# Patient Record
Sex: Female | Born: 1965
Health system: Southern US, Community
[De-identification: ages and names within clinical notes are randomized; demographics above are authoritative.]

## PROBLEM LIST (undated history)

## (undated) DIAGNOSIS — I10 Essential (primary) hypertension: Secondary | ICD-10-CM

## (undated) DIAGNOSIS — I2510 Atherosclerotic heart disease of native coronary artery without angina pectoris: Secondary | ICD-10-CM

## (undated) DIAGNOSIS — R519 Headache, unspecified: Secondary | ICD-10-CM

## (undated) DIAGNOSIS — Z9289 Personal history of other medical treatment: Secondary | ICD-10-CM

## (undated) DIAGNOSIS — C801 Malignant (primary) neoplasm, unspecified: Secondary | ICD-10-CM

## (undated) DIAGNOSIS — I519 Heart disease, unspecified: Secondary | ICD-10-CM

## (undated) DIAGNOSIS — J45909 Unspecified asthma, uncomplicated: Secondary | ICD-10-CM

## (undated) DIAGNOSIS — K219 Gastro-esophageal reflux disease without esophagitis: Secondary | ICD-10-CM

## (undated) DIAGNOSIS — R7303 Prediabetes: Secondary | ICD-10-CM

## (undated) DIAGNOSIS — E669 Obesity, unspecified: Secondary | ICD-10-CM

## (undated) DIAGNOSIS — R202 Paresthesia of skin: Principal | ICD-10-CM

## (undated) HISTORY — DX: Heart disease, unspecified: I51.9

## (undated) HISTORY — DX: Prediabetes: R73.03

## (undated) HISTORY — PX: ANGIOPLASTY: SHX39

## (undated) HISTORY — DX: Obesity, unspecified: E66.9

## (undated) HISTORY — DX: Paresthesia of skin: R20.2

## (undated) HISTORY — DX: Headache, unspecified: R51.9

## (undated) HISTORY — PX: JOINT REPLACEMENT: SHX530

## (undated) HISTORY — PX: BREAST BIOPSY: SHX20

## (undated) HISTORY — DX: Unspecified asthma, uncomplicated: J45.909

## (undated) HISTORY — PX: BREAST BIOPSY WITH SENTINEL LYMPH NODE BIOPSY AND NEEDLE LOCALIZATION: SHX5761

## (undated) HISTORY — PX: TOTAL KNEE ARTHROPLASTY: SHX125

---

## 2013-06-04 ENCOUNTER — Encounter (HOSPITAL_COMMUNITY): Payer: Self-pay | Admitting: Emergency Medicine

## 2013-06-04 ENCOUNTER — Emergency Department (INDEPENDENT_AMBULATORY_CARE_PROVIDER_SITE_OTHER)
Admission: EM | Admit: 2013-06-04 | Discharge: 2013-06-04 | Disposition: A | Discharge status: Home / Self Care | Payer: BC Managed Care – PPO | Source: Home / Self Care

## 2013-06-04 DIAGNOSIS — M6530 Trigger finger, unspecified finger: Secondary | ICD-10-CM

## 2013-06-04 DIAGNOSIS — G5601 Carpal tunnel syndrome, right upper limb: Secondary | ICD-10-CM

## 2013-06-04 DIAGNOSIS — M778 Other enthesopathies, not elsewhere classified: Secondary | ICD-10-CM

## 2013-06-04 DIAGNOSIS — G5600 Carpal tunnel syndrome, unspecified upper limb: Secondary | ICD-10-CM

## 2013-06-04 DIAGNOSIS — M65839 Other synovitis and tenosynovitis, unspecified forearm: Secondary | ICD-10-CM

## 2013-06-04 MED ORDER — TRAMADOL HCL 50 MG PO TABS: 50.0000 mg | ORAL_TABLET | Freq: Four times a day (QID) | ORAL | Status: DC | PRN

## 2013-06-04 MED ORDER — DICLOFENAC POTASSIUM 50 MG PO TABS: 50.0000 mg | ORAL_TABLET | Freq: Three times a day (TID) | ORAL | Status: DC

## 2013-06-04 NOTE — ED Notes (Signed)
Pt reports bilateral hand pain. Pain is worse in the left hand. Pt states that the pain is throbbing and radiates down to elbow. Left thumb tends to get stiff and stuck in a bent position. Pt has been using wrist brace and ibuprofen with no relief in symptoms.

## 2013-06-04 NOTE — ED Provider Notes (Signed)
CSN: [id number]     Arrival date & time 06/04/13  1252 History     First MD Initiated Contact with Patient 06/04/13 1413     Chief Complaint  Patient presents with  . Hand Pain    bilateral hand pain. pain is worse in the left hand.    (Consider location/radiation/quality/duration/timing/severity/associated sxs/prior Treatment) HPI Comments: 47 year old female presents with at least four-month history of bilateral hand and digit wrist and forearm pain. 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 During that time she developed the above pain. She also is complaining of a trigger finger of the left thumb. She is able to flex at the 10 unable to re\re extend it without help from the other hand. She is tender in all the digits,, wrist and forearm. Although she has nearly full range of motion most movements hurt the associated tendons and forearm musculature. No known blunt trauma or injuries such as falls.   History reviewed. No pertinent past medical history. Past Surgical History  Procedure Laterality Date  . Total knee arthroplasty     History reviewed. No pertinent family history. History  Substance Use Topics  . Smoking status: Never Smoker   . Smokeless tobacco: Not on file  . Alcohol Use: No   OB History   Grav Para Term Preterm Abortions TAB SAB Ect Mult Living                 Review of Systems  Constitutional: Negative for fever, chills and activity change.  HENT: Negative.   Respiratory: Negative.   Cardiovascular: Negative.   Musculoskeletal:       As per HPI  Skin: Negative for color change, pallor and rash.  Neurological: Negative.     Allergies  Penicillins  Home Medications   Current Outpatient Rx  Name  Route  Sig  Dispense  Refill  . diclofenac (CATAFLAM) 50 MG tablet   Oral   Take 1 tablet (50 mg total) by mouth 3 (three) times daily.   24 tablet   0   . traMADol  (ULTRAM) 50 MG tablet   Oral   Take 1 tablet (50 mg total) by mouth every 6 (six) hours as needed for pain.   15 tablet   0    BP 128/72  Pulse 68  Temp(Src) 98.6 F (37 C) (Oral)  Resp 18  SpO2 100% Physical Exam  Nursing note and vitals reviewed. Constitutional: She is oriented to person, place, and time. She appears well-developed and well-nourished. No distress.  HENT:  Head: Normocephalic and atraumatic.  Eyes: EOM are normal.  Pulmonary/Chest: Effort normal. No respiratory distress.  Musculoskeletal:  No edema is appreciated. No erythema. There is tenderness along bilateral forearms, wrists and digits. Positive Finkelstein bilateral. Trigger finger left thumb, she is able to make a fist but this produces pain in the digits, wrist and forearm. Positive Phalen's sign. Positive Tinel's.  Neurological: She is alert and oriented to person, place, and time. No cranial nerve deficit.  Skin: Skin is warm and dry.  Psychiatric: She has a normal mood and affect.    ED Course   Procedures (including critical care time)  Labs Reviewed - No data to display No results found. 1. Tendinitis of both wrists   2. Tendinitis of thumb   3. Trigger finger, left   4. Carpal tunnel syndrome of right wrist  MDM  He splints until you see your orthopedist next month as scheduled. We will also apply a left thumb splint and near extension to slightly flex. Apply ice as needed to the wrist and fingers. Ultram 50 mg every 6 hours when necessary pain Cataflam 50 mg 3 times a day when necessary pain Limit use of hands and wrist as much as possible.  Hayden Rasmussen, NP 06/04/13 1438

## 2013-06-04 NOTE — ED Notes (Signed)
Review of Rx

## 2013-06-04 NOTE — ED Provider Notes (Signed)
Medical screening examination/treatment/procedure(s) were performed by non-physician practitioner and as supervising physician I was immediately available for consultation/collaboration.  Leslee Home, M.D.  Reuben Likes, MD 06/04/13 [phone]

## 2013-06-04 NOTE — ED Notes (Signed)
Chart review.

## 2013-06-04 NOTE — ED Notes (Signed)
Waiting discharge papers 

## 2013-07-06 ENCOUNTER — Other Ambulatory Visit: Payer: Self-pay | Admitting: Internal Medicine

## 2013-07-06 DIAGNOSIS — N6320 Unspecified lump in the left breast, unspecified quadrant: Secondary | ICD-10-CM

## 2013-07-19 ENCOUNTER — Ambulatory Visit
Admission: RE | Admit: 2013-07-19 | Discharge: 2013-07-19 | Disposition: A | Discharge status: Home / Self Care | Payer: BC Managed Care – PPO | Source: Ambulatory Visit | Attending: Internal Medicine | Admitting: Internal Medicine

## 2013-07-19 ENCOUNTER — Other Ambulatory Visit: Payer: Self-pay | Admitting: Internal Medicine

## 2013-07-19 DIAGNOSIS — N6320 Unspecified lump in the left breast, unspecified quadrant: Secondary | ICD-10-CM

## 2013-10-03 ENCOUNTER — Other Ambulatory Visit (HOSPITAL_COMMUNITY)
Admission: RE | Admit: 2013-10-03 | Discharge: 2013-10-03 | Disposition: A | Discharge status: Home / Self Care | Payer: BC Managed Care – PPO | Source: Ambulatory Visit | Attending: Obstetrics & Gynecology | Admitting: Obstetrics & Gynecology

## 2013-10-03 ENCOUNTER — Other Ambulatory Visit: Payer: Self-pay | Admitting: Obstetrics & Gynecology

## 2013-10-03 DIAGNOSIS — Z01419 Encounter for gynecological examination (general) (routine) without abnormal findings: Secondary | ICD-10-CM | POA: Insufficient documentation

## 2013-10-03 DIAGNOSIS — Z1151 Encounter for screening for human papillomavirus (HPV): Secondary | ICD-10-CM | POA: Insufficient documentation

## 2013-11-05 ENCOUNTER — Other Ambulatory Visit: Payer: Self-pay | Admitting: Gastroenterology

## 2013-11-06 ENCOUNTER — Encounter (HOSPITAL_COMMUNITY): Payer: Self-pay | Admitting: Pharmacy Technician

## 2013-11-06 ENCOUNTER — Encounter (HOSPITAL_COMMUNITY): Payer: Self-pay | Admitting: *Deleted

## 2013-12-05 ENCOUNTER — Encounter (HOSPITAL_COMMUNITY): Payer: Self-pay | Admitting: *Deleted

## 2013-12-12 DIAGNOSIS — F3289 Other specified depressive episodes: Secondary | ICD-10-CM | POA: Diagnosis not present

## 2013-12-12 DIAGNOSIS — F329 Major depressive disorder, single episode, unspecified: Secondary | ICD-10-CM | POA: Diagnosis not present

## 2013-12-12 DIAGNOSIS — N3941 Urge incontinence: Secondary | ICD-10-CM | POA: Diagnosis not present

## 2014-01-30 ENCOUNTER — Encounter (HOSPITAL_COMMUNITY): Payer: Self-pay | Admitting: *Deleted

## 2014-02-18 ENCOUNTER — Other Ambulatory Visit: Payer: Self-pay | Admitting: Gastroenterology

## 2014-02-19 ENCOUNTER — Ambulatory Visit (HOSPITAL_COMMUNITY)
Admission: RE | Admit: 2014-02-19 | Discharge: 2014-02-19 | Disposition: A | Discharge status: Home / Self Care | Payer: Medicare Other | Source: Ambulatory Visit | Attending: Gastroenterology | Admitting: Gastroenterology

## 2014-02-19 ENCOUNTER — Encounter (HOSPITAL_COMMUNITY)
Admission: RE | Disposition: A | Discharge status: Home / Self Care | Payer: Self-pay | Source: Ambulatory Visit | Attending: Gastroenterology

## 2014-02-19 ENCOUNTER — Ambulatory Visit (HOSPITAL_COMMUNITY): Payer: Medicare Other | Admitting: Anesthesiology

## 2014-02-19 ENCOUNTER — Encounter (HOSPITAL_COMMUNITY): Payer: Self-pay | Admitting: *Deleted

## 2014-02-19 ENCOUNTER — Encounter (HOSPITAL_COMMUNITY): Payer: Medicare Other | Admitting: Anesthesiology

## 2014-02-19 DIAGNOSIS — E669 Obesity, unspecified: Secondary | ICD-10-CM | POA: Insufficient documentation

## 2014-02-19 DIAGNOSIS — Z9079 Acquired absence of other genital organ(s): Secondary | ICD-10-CM | POA: Diagnosis not present

## 2014-02-19 DIAGNOSIS — K921 Melena: Secondary | ICD-10-CM | POA: Insufficient documentation

## 2014-02-19 DIAGNOSIS — K219 Gastro-esophageal reflux disease without esophagitis: Secondary | ICD-10-CM | POA: Diagnosis not present

## 2014-02-19 DIAGNOSIS — Z880 Allergy status to penicillin: Secondary | ICD-10-CM | POA: Insufficient documentation

## 2014-02-19 DIAGNOSIS — Z96659 Presence of unspecified artificial knee joint: Secondary | ICD-10-CM | POA: Diagnosis not present

## 2014-02-19 DIAGNOSIS — I10 Essential (primary) hypertension: Secondary | ICD-10-CM | POA: Insufficient documentation

## 2014-02-19 HISTORY — DX: Personal history of other medical treatment: Z92.89

## 2014-02-19 HISTORY — DX: Essential (primary) hypertension: I10

## 2014-02-19 HISTORY — DX: Gastro-esophageal reflux disease without esophagitis: K21.9

## 2014-02-19 HISTORY — PX: COLONOSCOPY WITH PROPOFOL: SHX5780

## 2014-02-19 SURGERY — COLONOSCOPY WITH PROPOFOL
Anesthesia: Monitor Anesthesia Care

## 2014-02-19 MED ORDER — LACTATED RINGERS IV SOLN
INTRAVENOUS | Status: DC | PRN
  Administered 2014-02-19: 10:00:00 via INTRAVENOUS

## 2014-02-19 MED ORDER — PROPOFOL INFUSION 10 MG/ML OPTIME
INTRAVENOUS | Status: DC | PRN
  Administered 2014-02-19: 140 ug/kg/min via INTRAVENOUS

## 2014-02-19 MED ORDER — SODIUM CHLORIDE 0.9 % IV SOLN: INTRAVENOUS | Status: DC

## 2014-02-19 MED ORDER — KETAMINE HCL 10 MG/ML IJ SOLN
INTRAMUSCULAR | Status: DC | PRN
  Administered 2014-02-19: 10 mg via INTRAVENOUS

## 2014-02-19 MED ORDER — FENTANYL CITRATE 0.05 MG/ML IJ SOLN
INTRAMUSCULAR | Status: DC | PRN
  Administered 2014-02-19 (×2): 50 ug via INTRAVENOUS

## 2014-02-19 MED ORDER — LACTATED RINGERS IV SOLN: INTRAVENOUS | Status: DC

## 2014-02-19 SURGICAL SUPPLY — 21 items

## 2014-02-19 NOTE — Anesthesia Preprocedure Evaluation (Addendum)
Anesthesia Evaluation  Patient identified by MRN, date of birth, ID band Patient awake    Reviewed: Allergy & Precautions, H&P , NPO status , Patient's Chart, lab work & pertinent test results  Airway Mallampati: II TM Distance: >3 FB Neck ROM: Full    Dental  (+) Dental Advisory Given   Pulmonary neg pulmonary ROS,  breath sounds clear to auscultation- rhonchi        Cardiovascular hypertension, Pt. on medications Rhythm:Regular Rate:Normal     Neuro/Psych negative neurological ROS  negative psych ROS   GI/Hepatic Neg liver ROS, GERD-  Medicated,  Endo/Other  negative endocrine ROS  Renal/GU negative Renal ROS     Musculoskeletal negative musculoskeletal ROS (+)   Abdominal (+) + obese,   Peds  Hematology negative hematology ROS (+)   Anesthesia Other Findings   Reproductive/Obstetrics negative OB ROS                          Anesthesia Physical Anesthesia Plan  ASA: II  Anesthesia Plan: MAC   Post-op Pain Management:    Induction: Intravenous  Airway Management Planned:   Additional Equipment:   Intra-op Plan:   Post-operative Plan:   Informed Consent: I have reviewed the patients History and Physical, chart, labs and discussed the procedure including the risks, benefits and alternatives for the proposed anesthesia with the patient or authorized representative who has indicated his/her understanding and acceptance.   Dental advisory given  Plan Discussed with: CRNA  Anesthesia Plan Comments:         Anesthesia Quick Evaluation

## 2014-02-19 NOTE — Op Note (Signed)
Problem: Intermittent hematochezia  Endoscopist: Danise EdgeMartin Ajane Novella  Premedication: Propofol administered by anesthesia  Procedure: Diagnostic colonoscopy Anal inspection and digital rectal exam were normal. The Pentax pediatric colonoscope was introduced into the rectum and easily advanced to the cecum. A normal-appearing ileocecal valve and appendiceal orifice were identified. Colonic preparation for the exam today was good  Rectum. Normal. Retroflexed view of the distal rectum showed moderate sized nonbleeding internal hemorrhoids  Sigmoid colon and descending colon. Normal  Splenic flexure. Normal  Transverse colon. Normal  Hepatic flexure. Normal  Ascending colon. Normal  Cecum and ileocecal valve. Normal  Assessment: Normal diagnostic proctocolonoscopy to the cecum.  Recommendation: Schedule screening colonoscopy in 10 years

## 2014-02-19 NOTE — Transfer of Care (Signed)
Immediate Anesthesia Transfer of Care Note  Patient: Jo Allen  Procedure(s) Performed: Procedure(s): COLONOSCOPY WITH PROPOFOL (N/A)  Patient Location: PACU  Anesthesia Type:MAC  Level of Consciousness: awake, alert  and oriented  Airway & Oxygen Therapy: Patient Spontanous Breathing  Post-op Assessment: Report given to PACU RN and Post -op Vital signs reviewed and stable  Post vital signs: Reviewed and stable  Complications: No apparent anesthesia complications

## 2014-02-19 NOTE — H&P (Signed)
  Problem: Hematochezia associated with a normal hemoglobin  History: The patient is a 48 year old female born 04/02/1966. She has unexplained intermittent hematochezia unassociated with diarrhea or constipation. She is scheduled to undergo a diagnostic colonoscopy  Medication allergies: Penicillin and morphine  Past medical history: Knee replacement surgery. Benign breast biopsy. Oophorectomy. Hypertension. Depression. Asthma.  Exam: The patient is alert and lying comfortably on the endoscopy stretcher. Abdomen is soft and nontender to palpation. Lungs are clear to auscultation. Cardiac exam reveals a regular rhythm.  Plan: Proceed with diagnostic colonoscopy to evaluate intermittent hematochezia.

## 2014-02-19 NOTE — Anesthesia Postprocedure Evaluation (Signed)
Anesthesia Post Note  Patient: Jo Allen  Procedure(s) Performed: Procedure(s) (LRB): COLONOSCOPY WITH PROPOFOL (N/A)  Anesthesia type: MAC  Patient location: PACU  Post pain: Pain level controlled  Post assessment: Post-op Vital signs reviewed  Last Vitals: BP 118/77  Pulse 56  Temp(Src) 36.4 C (Oral)  Resp 23  Ht 5\' 5"  (1.651 m)  Wt 230 lb (104.327 kg)  BMI 38.27 kg/m2  SpO2 100%  Post vital signs: Reviewed  Level of consciousness: awake  Complications: No apparent anesthesia complications

## 2014-02-20 ENCOUNTER — Encounter (HOSPITAL_COMMUNITY): Payer: Self-pay | Admitting: Gastroenterology

## 2014-04-09 DIAGNOSIS — I10 Essential (primary) hypertension: Secondary | ICD-10-CM | POA: Diagnosis not present

## 2014-04-09 DIAGNOSIS — E782 Mixed hyperlipidemia: Secondary | ICD-10-CM | POA: Diagnosis not present

## 2014-04-11 DIAGNOSIS — F319 Bipolar disorder, unspecified: Secondary | ICD-10-CM | POA: Diagnosis not present

## 2014-04-26 ENCOUNTER — Other Ambulatory Visit: Payer: Self-pay | Admitting: Internal Medicine

## 2014-04-26 ENCOUNTER — Ambulatory Visit
Admission: RE | Admit: 2014-04-26 | Discharge: 2014-04-26 | Disposition: A | Discharge status: Home / Self Care | Payer: Medicare Other | Source: Ambulatory Visit | Attending: Internal Medicine | Admitting: Internal Medicine

## 2014-04-26 DIAGNOSIS — K7689 Other specified diseases of liver: Secondary | ICD-10-CM | POA: Diagnosis not present

## 2014-04-26 DIAGNOSIS — E663 Overweight: Secondary | ICD-10-CM | POA: Diagnosis not present

## 2014-04-26 DIAGNOSIS — Z6838 Body mass index (BMI) 38.0-38.9, adult: Secondary | ICD-10-CM | POA: Diagnosis not present

## 2014-04-26 DIAGNOSIS — R109 Unspecified abdominal pain: Secondary | ICD-10-CM | POA: Diagnosis not present

## 2014-04-26 DIAGNOSIS — I10 Essential (primary) hypertension: Secondary | ICD-10-CM | POA: Diagnosis not present

## 2014-04-26 DIAGNOSIS — F39 Unspecified mood [affective] disorder: Secondary | ICD-10-CM | POA: Diagnosis not present

## 2014-04-26 DIAGNOSIS — R1011 Right upper quadrant pain: Secondary | ICD-10-CM

## 2014-04-26 DIAGNOSIS — M25569 Pain in unspecified knee: Secondary | ICD-10-CM | POA: Diagnosis not present

## 2014-05-06 ENCOUNTER — Emergency Department (HOSPITAL_COMMUNITY): Payer: Medicare Other

## 2014-05-06 ENCOUNTER — Emergency Department (HOSPITAL_COMMUNITY)
Admission: EM | Admit: 2014-05-06 | Discharge: 2014-05-06 | Disposition: A | Discharge status: Home / Self Care | Payer: Medicare Other | Attending: Emergency Medicine | Admitting: Emergency Medicine

## 2014-05-06 ENCOUNTER — Encounter (HOSPITAL_COMMUNITY): Payer: Self-pay | Admitting: Emergency Medicine

## 2014-05-06 DIAGNOSIS — M549 Dorsalgia, unspecified: Secondary | ICD-10-CM | POA: Diagnosis not present

## 2014-05-06 DIAGNOSIS — I10 Essential (primary) hypertension: Secondary | ICD-10-CM | POA: Diagnosis not present

## 2014-05-06 DIAGNOSIS — R079 Chest pain, unspecified: Secondary | ICD-10-CM | POA: Diagnosis not present

## 2014-05-06 DIAGNOSIS — R071 Chest pain on breathing: Secondary | ICD-10-CM | POA: Diagnosis not present

## 2014-05-06 DIAGNOSIS — Z79899 Other long term (current) drug therapy: Secondary | ICD-10-CM | POA: Diagnosis not present

## 2014-05-06 DIAGNOSIS — Z880 Allergy status to penicillin: Secondary | ICD-10-CM | POA: Insufficient documentation

## 2014-05-06 DIAGNOSIS — R109 Unspecified abdominal pain: Secondary | ICD-10-CM | POA: Diagnosis not present

## 2014-05-06 DIAGNOSIS — F319 Bipolar disorder, unspecified: Secondary | ICD-10-CM | POA: Diagnosis not present

## 2014-05-06 DIAGNOSIS — Z8719 Personal history of other diseases of the digestive system: Secondary | ICD-10-CM | POA: Insufficient documentation

## 2014-05-06 LAB — URINALYSIS, ROUTINE W REFLEX MICROSCOPIC
BILIRUBIN URINE: NEGATIVE
Glucose, UA: NEGATIVE mg/dL
Hgb urine dipstick: NEGATIVE
Ketones, ur: NEGATIVE mg/dL
Leukocytes, UA: NEGATIVE
Nitrite: NEGATIVE
PROTEIN: NEGATIVE mg/dL
Specific Gravity, Urine: 1.014 (ref 1.005–1.030)
UROBILINOGEN UA: 0.2 mg/dL (ref 0.0–1.0)
pH: 7.5 (ref 5.0–8.0)

## 2014-05-06 MED ORDER — ACETAMINOPHEN 500 MG PO TABS
1000.0000 mg | ORAL_TABLET | Freq: Once | ORAL | Status: AC
  Administered 2014-05-06: 1000 mg via ORAL
  Filled 2014-05-06: qty 2

## 2014-05-06 MED ORDER — HYDROCODONE-ACETAMINOPHEN 5-325 MG PO TABS
1.0000 | ORAL_TABLET | Freq: Once | ORAL | Status: AC
  Administered 2014-05-06: 1 via ORAL
  Filled 2014-05-06: qty 1

## 2014-05-06 MED ORDER — HYDROCODONE-ACETAMINOPHEN 5-325 MG PO TABS: 1.0000 | ORAL_TABLET | Freq: Four times a day (QID) | ORAL | Status: DC | PRN

## 2014-05-06 NOTE — Discharge Instructions (Signed)
Take Tylenol for mild pain or the pain medicine prescribed for bad pain. Do not take Tylenol and medication prescribed together as the combination is dangerous. Call Dr.Polite to schedule an appointment in the office if not improving in a week

## 2014-05-06 NOTE — ED Notes (Signed)
Pt c/o right flank/back pain for two months. Pt states pain is worse when she moves, sleeps, and after she eats.  Pt states that her PCP examined her gallbladder and stated it was fine. Pt knows that she does have nodule on her lung.

## 2014-05-06 NOTE — ED Provider Notes (Signed)
CSN: [id number]     Arrival date & time 05/06/14  1220 History   First MD Initiated Contact with Patient 05/06/14 1433     Chief Complaint  Patient presents with  . Back Pain  . Flank Pain     (Consider location/radiation/quality/duration/timing/severity/associated sxs/prior Treatment) HPI Complains of right-sided posterior chest pain and right-sided flank pain for 2 months. Pain is worse with changing positions. Pain is improved with remaining still. No urinary symptoms. No fever no nausea or vomiting. No shortness of breath no cough no other associated symptoms. No treatment prior to coming here.. Patient had abdominal ultrasound performed 04/26/2014 which showed mild hepatic steatosis otherwise normal. Past Medical History  Diagnosis Date  . Hypertension   . Transfusion history     7564yrs ago  . GERD (gastroesophageal reflux disease)    Past Surgical History  Procedure Laterality Date  . Total knee arthroplasty Bilateral   . Joint replacement Bilateral   . Breast biopsy with sentinel lymph node biopsy and needle localization Left   . Colonoscopy with propofol N/A 02/19/2014    Procedure: COLONOSCOPY WITH PROPOFOL;  Surgeon: Charolett BumpersMartin K Johnson, MD;  Location: [facility];  Service: Endoscopy;  Laterality: N/A;   No family history on file. History  Substance Use Topics  . Smoking status: Never Smoker   . Smokeless tobacco: Not on file  . Alcohol Use: No   OB History   Grav Para Term Preterm Abortions TAB SAB Ect Mult Living                 Review of Systems  Constitutional: Negative.   HENT: Negative.   Respiratory: Negative.   Cardiovascular: Negative.   Gastrointestinal: Negative.   Genitourinary: Positive for flank pain.       Perimenopausal. Last menstrual period 6 months ago  Musculoskeletal: Negative.   Skin: Negative.   Neurological: Negative.   Psychiatric/Behavioral: Negative.   All other systems reviewed and are negative.     Allergies  Morphine and  related and Penicillins  Home Medications   Prior to Admission medications   Medication Sig Start Date End Date Taking? Authorizing Provider  atorvastatin (LIPITOR) 10 MG tablet Take 10 mg by mouth every morning.   Yes Historical Provider, MD  [facility] CAPS Take 1 capsule by mouth daily.   Yes Historical Provider, MD  CRANBERRY PO Take 1 tablet by mouth daily.   Yes Historical Provider, MD  hydrochlorothiazide (MICROZIDE) 12.5 MG capsule Take 12.5 mg by mouth every morning.   Yes Historical Provider, MD  verapamil (VERELAN PM) 360 MG 24 hr capsule Take 360 mg by mouth every morning.   Yes Historical Provider, MD  vitamin B-12 (CYANOCOBALAMIN) 1000 MCG tablet Take 1,000 mcg by mouth daily.   Yes Historical Provider, MD  vitamin E 100 UNIT capsule Take 100 Units by mouth daily.   Yes Historical Provider, MD   BP 116/81  Pulse 74  Temp(Src) 98.2 F (36.8 C) (Oral)  Resp 18  SpO2 100% Physical Exam  Nursing note and vitals reviewed. Constitutional: She appears well-developed and well-nourished.  HENT:  Head: Normocephalic and atraumatic.  Eyes: Conjunctivae are normal. Pupils are equal, round, and reactive to light.  Neck: Neck supple. No tracheal deviation present. No thyromegaly present.  Cardiovascular: Normal rate and regular rhythm.   No murmur heard. Pulmonary/Chest: Effort normal and breath sounds normal.  Tender over right posterior ribs  Abdominal: Soft. Bowel sounds are normal. She exhibits no distension. There is  no tenderness.  OBese  Genitourinary:  Mild right flank tenderness.  Musculoskeletal: Normal range of motion. She exhibits no edema and no tenderness.  Neurological: She is alert. Coordination normal.  Skin: Skin is warm and dry. No rash noted.  Psychiatric: She has a normal mood and affect.    ED Course  Procedures (including critical care time) Labs Review Labs Reviewed  URINALYSIS, ROUTINE W REFLEX MICROSCOPIC   Xray viewed by me. Results for  orders placed during the hospital encounter of 05/06/14  URINALYSIS, ROUTINE W REFLEX MICROSCOPIC      Result Value Ref Range   Color, Urine YELLOW  YELLOW   APPearance CLOUDY (*) CLEAR   Specific Gravity, Urine 1.014  1.005 - 1.030   pH 7.5  5.0 - 8.0   Glucose, UA NEGATIVE  NEGATIVE mg/dL   Hgb urine dipstick NEGATIVE  NEGATIVE   Bilirubin Urine NEGATIVE  NEGATIVE   Ketones, ur NEGATIVE  NEGATIVE mg/dL   Protein, ur NEGATIVE  NEGATIVE mg/dL   Urobilinogen, UA 0.2  0.0 - 1.0 mg/dL   Nitrite NEGATIVE  NEGATIVE   Leukocytes, UA NEGATIVE  NEGATIVE   Dg Chest 2 View  05/06/2014   CLINICAL DATA:  Right posterior chest pain for 2 months.  EXAM: CHEST  2 VIEW  COMPARISON:  None.  FINDINGS: The cardiomediastinal silhouette is within normal limits. The lungs are well inflated and clear. There is no evidence of pleural effusion or pneumothorax. No acute osseous abnormality is identified. Mild-to-moderate endplate osteophyte formation is noted in the thoracic spine.  IMPRESSION: No acute abnormality identified.   Electronically Signed   By: Sebastian Ache   On: 05/06/2014 15:46   US Abdomen Complete  04/26/2014   CLINICAL DATA:  RUQ PAIN  EXAM: ULTRASOUND ABDOMEN COMPLETE  COMPARISON:  None.  FINDINGS: Gallbladder:  No gallstones or wall thickening visualized 2.4 mm. No sonographic Murphy sign noted.  Common bile duct:  Diameter: 3.1 mm  Liver:  No focal lesion identified.  Mild diffuse increased echogenicity.  IVC:  No abnormality visualized.  Pancreas:  Visualized portion unremarkable.  Spleen:  Size and appearance within normal limits.  Right Kidney:  Length: 11 cm. Echogenicity within normal limits. No mass or hydronephrosis visualized.  Left Kidney:  Length: 11.1 cm. Echogenicity within normal limits. No mass or hydronephrosis visualized.  Abdominal aorta:  No aneurysm visualized.  Other findings:  None.  IMPRESSION: Mild hepatic steatosis.  Otherwise negative.   Electronically Signed   By: Salome Holmes M.D.   On: 04/26/2014 13:44    Imaging Review No results found.   EKG Interpretation None     3:50 PM pain not improved after treatment Tylenol. Norco ordered MDM  Pain likely musculoskeletal in etiology. Plan prescription Norco. Followup with Dr.Polite if significant pain one week. Diagnosis #1 chest wall pain #2 right flank pain Final diagnoses:  None        Doug Sou, MD 05/06/14 1636

## 2014-05-27 ENCOUNTER — Encounter (HOSPITAL_COMMUNITY): Payer: Self-pay | Admitting: Emergency Medicine

## 2014-05-27 ENCOUNTER — Emergency Department (INDEPENDENT_AMBULATORY_CARE_PROVIDER_SITE_OTHER)
Admission: EM | Admit: 2014-05-27 | Discharge: 2014-05-27 | Disposition: A | Discharge status: Home / Self Care | Payer: Medicare Other | Source: Home / Self Care | Attending: Family Medicine | Admitting: Family Medicine

## 2014-05-27 ENCOUNTER — Emergency Department (INDEPENDENT_AMBULATORY_CARE_PROVIDER_SITE_OTHER): Payer: Medicare Other

## 2014-05-27 DIAGNOSIS — M25569 Pain in unspecified knee: Secondary | ICD-10-CM | POA: Diagnosis not present

## 2014-05-27 DIAGNOSIS — S8990XA Unspecified injury of unspecified lower leg, initial encounter: Secondary | ICD-10-CM | POA: Diagnosis not present

## 2014-05-27 DIAGNOSIS — Y9229 Other specified public building as the place of occurrence of the external cause: Secondary | ICD-10-CM | POA: Diagnosis not present

## 2014-05-27 DIAGNOSIS — S99929A Unspecified injury of unspecified foot, initial encounter: Secondary | ICD-10-CM | POA: Diagnosis not present

## 2014-05-27 DIAGNOSIS — T07XXXA Unspecified multiple injuries, initial encounter: Secondary | ICD-10-CM

## 2014-05-27 DIAGNOSIS — S298XXA Other specified injuries of thorax, initial encounter: Secondary | ICD-10-CM | POA: Diagnosis not present

## 2014-05-27 DIAGNOSIS — S99919A Unspecified injury of unspecified ankle, initial encounter: Secondary | ICD-10-CM | POA: Diagnosis not present

## 2014-05-27 DIAGNOSIS — W19XXXA Unspecified fall, initial encounter: Secondary | ICD-10-CM | POA: Diagnosis not present

## 2014-05-27 MED ORDER — KETOROLAC TROMETHAMINE 60 MG/2ML IM SOLN
60.0000 mg | Freq: Once | INTRAMUSCULAR | Status: AC
  Administered 2014-05-27: 60 mg via INTRAMUSCULAR

## 2014-05-27 MED ORDER — KETOROLAC TROMETHAMINE 60 MG/2ML IM SOLN
INTRAMUSCULAR | Status: AC
  Filled 2014-05-27: qty 2

## 2014-05-27 NOTE — Discharge Instructions (Signed)
You suffered what is called a mechanical fall. There is no evidence of serious medical condition that led to you fall. You likely lost your balance. Dehydration may have contributed to this Please start aleve as needed in 24 hrs.  Please stay active and let pain be your guide as you heal Expect to be a little stiff for the next few days If you are not any better in 2 weeks then come back for more xrays.
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
## 2014-05-28 DIAGNOSIS — F319 Bipolar disorder, unspecified: Secondary | ICD-10-CM | POA: Diagnosis not present

## 2014-06-19 ENCOUNTER — Other Ambulatory Visit: Payer: Self-pay

## 2014-06-19 DIAGNOSIS — Z1231 Encounter for screening mammogram for malignant neoplasm of breast: Secondary | ICD-10-CM

## 2014-06-25 DIAGNOSIS — F319 Bipolar disorder, unspecified: Secondary | ICD-10-CM | POA: Diagnosis not present

## 2014-06-28 DIAGNOSIS — F319 Bipolar disorder, unspecified: Secondary | ICD-10-CM | POA: Diagnosis not present

## 2014-07-08 DIAGNOSIS — L259 Unspecified contact dermatitis, unspecified cause: Secondary | ICD-10-CM | POA: Diagnosis not present

## 2014-07-15 DIAGNOSIS — F319 Bipolar disorder, unspecified: Secondary | ICD-10-CM | POA: Diagnosis not present

## 2014-07-22 DIAGNOSIS — F319 Bipolar disorder, unspecified: Secondary | ICD-10-CM | POA: Diagnosis not present

## 2014-07-23 ENCOUNTER — Ambulatory Visit
Admission: RE | Admit: 2014-07-23 | Discharge: 2014-07-23 | Disposition: A | Discharge status: Home / Self Care | Payer: Medicare Other | Source: Ambulatory Visit

## 2014-07-23 DIAGNOSIS — Z1231 Encounter for screening mammogram for malignant neoplasm of breast: Secondary | ICD-10-CM

## 2014-07-25 ENCOUNTER — Other Ambulatory Visit: Payer: Self-pay | Admitting: Internal Medicine

## 2014-07-25 DIAGNOSIS — N63 Unspecified lump in unspecified breast: Secondary | ICD-10-CM

## 2014-08-01 ENCOUNTER — Other Ambulatory Visit: Payer: Medicare Other

## 2014-08-07 DIAGNOSIS — F319 Bipolar disorder, unspecified: Secondary | ICD-10-CM | POA: Diagnosis not present

## 2014-08-19 ENCOUNTER — Ambulatory Visit
Admission: RE | Admit: 2014-08-19 | Discharge: 2014-08-19 | Disposition: A | Discharge status: Home / Self Care | Payer: Medicare Other | Source: Ambulatory Visit | Attending: Internal Medicine | Admitting: Internal Medicine

## 2014-08-19 DIAGNOSIS — N63 Unspecified lump in unspecified breast: Secondary | ICD-10-CM

## 2014-08-19 DIAGNOSIS — N6012 Diffuse cystic mastopathy of left breast: Secondary | ICD-10-CM | POA: Diagnosis not present

## 2014-08-29 DIAGNOSIS — J069 Acute upper respiratory infection, unspecified: Secondary | ICD-10-CM | POA: Diagnosis not present

## 2014-08-29 DIAGNOSIS — J45901 Unspecified asthma with (acute) exacerbation: Secondary | ICD-10-CM | POA: Diagnosis not present

## 2014-09-05 ENCOUNTER — Ambulatory Visit: Payer: Medicare Other | Admitting: Internal Medicine

## 2014-09-27 DIAGNOSIS — Z0001 Encounter for general adult medical examination with abnormal findings: Secondary | ICD-10-CM | POA: Diagnosis not present

## 2014-09-27 DIAGNOSIS — I10 Essential (primary) hypertension: Secondary | ICD-10-CM | POA: Diagnosis not present

## 2014-09-27 DIAGNOSIS — E782 Mixed hyperlipidemia: Secondary | ICD-10-CM | POA: Diagnosis not present

## 2014-09-27 DIAGNOSIS — R0789 Other chest pain: Secondary | ICD-10-CM | POA: Diagnosis not present

## 2014-09-27 DIAGNOSIS — Z1389 Encounter for screening for other disorder: Secondary | ICD-10-CM | POA: Diagnosis not present

## 2014-10-24 DIAGNOSIS — Z113 Encounter for screening for infections with a predominantly sexual mode of transmission: Secondary | ICD-10-CM | POA: Diagnosis not present

## 2014-10-24 DIAGNOSIS — Z6838 Body mass index (BMI) 38.0-38.9, adult: Secondary | ICD-10-CM | POA: Diagnosis not present

## 2014-10-24 DIAGNOSIS — Z01411 Encounter for gynecological examination (general) (routine) with abnormal findings: Secondary | ICD-10-CM | POA: Diagnosis not present

## 2014-10-24 DIAGNOSIS — N3941 Urge incontinence: Secondary | ICD-10-CM | POA: Diagnosis not present

## 2014-11-20 ENCOUNTER — Encounter (HOSPITAL_COMMUNITY): Payer: Self-pay | Admitting: *Deleted

## 2014-11-20 ENCOUNTER — Emergency Department (HOSPITAL_COMMUNITY): Payer: Medicare Other

## 2014-11-20 ENCOUNTER — Emergency Department (INDEPENDENT_AMBULATORY_CARE_PROVIDER_SITE_OTHER)
Admission: EM | Admit: 2014-11-20 | Discharge: 2014-11-20 | Disposition: A | Discharge status: Home / Self Care | Payer: Medicare Other | Source: Home / Self Care | Attending: Emergency Medicine | Admitting: Emergency Medicine

## 2014-11-20 ENCOUNTER — Emergency Department (HOSPITAL_COMMUNITY)
Admission: EM | Admit: 2014-11-20 | Discharge: 2014-11-20 | Disposition: A | Discharge status: Home / Self Care | Payer: Medicare Other | Attending: Emergency Medicine | Admitting: Emergency Medicine

## 2014-11-20 DIAGNOSIS — Z79899 Other long term (current) drug therapy: Secondary | ICD-10-CM | POA: Insufficient documentation

## 2014-11-20 DIAGNOSIS — Z880 Allergy status to penicillin: Secondary | ICD-10-CM | POA: Insufficient documentation

## 2014-11-20 DIAGNOSIS — R0602 Shortness of breath: Secondary | ICD-10-CM | POA: Diagnosis not present

## 2014-11-20 DIAGNOSIS — R51 Headache: Secondary | ICD-10-CM | POA: Insufficient documentation

## 2014-11-20 DIAGNOSIS — I2510 Atherosclerotic heart disease of native coronary artery without angina pectoris: Secondary | ICD-10-CM | POA: Diagnosis not present

## 2014-11-20 DIAGNOSIS — Z8719 Personal history of other diseases of the digestive system: Secondary | ICD-10-CM | POA: Diagnosis not present

## 2014-11-20 DIAGNOSIS — R079 Chest pain, unspecified: Secondary | ICD-10-CM

## 2014-11-20 DIAGNOSIS — I10 Essential (primary) hypertension: Secondary | ICD-10-CM | POA: Insufficient documentation

## 2014-11-20 DIAGNOSIS — F419 Anxiety disorder, unspecified: Secondary | ICD-10-CM | POA: Insufficient documentation

## 2014-11-20 DIAGNOSIS — R0789 Other chest pain: Secondary | ICD-10-CM | POA: Insufficient documentation

## 2014-11-20 DIAGNOSIS — R202 Paresthesia of skin: Secondary | ICD-10-CM

## 2014-11-20 DIAGNOSIS — R200 Anesthesia of skin: Secondary | ICD-10-CM | POA: Diagnosis not present

## 2014-11-20 DIAGNOSIS — R519 Headache, unspecified: Secondary | ICD-10-CM

## 2014-11-20 HISTORY — DX: Atherosclerotic heart disease of native coronary artery without angina pectoris: I25.10

## 2014-11-20 LAB — BASIC METABOLIC PANEL
Anion gap: 7 (ref 5–15)
BUN: 8 mg/dL (ref 6–23)
CO2: 30 mmol/L (ref 19–32)
Calcium: 9.9 mg/dL (ref 8.4–10.5)
Chloride: 103 mmol/L (ref 96–112)
Creatinine, Ser: 0.82 mg/dL (ref 0.50–1.10)
GFR calc Af Amer: >90 mL/min (ref >90)
GFR calc non Af Amer: 82 mL/min — ABNORMAL LOW (ref >90)
Glucose, Bld: 90 mg/dL (ref 70–99)
Potassium: 3.7 mmol/L (ref 3.5–5.1)
Sodium: 140 mmol/L (ref 135–145)

## 2014-11-20 LAB — CBC
HCT: 41 % (ref 36.0–46.0)
Hemoglobin: 13.4 g/dL (ref 12.0–15.0)
MCH: 26.9 pg (ref 26.0–34.0)
MCHC: 32.7 g/dL (ref 30.0–36.0)
MCV: 82.3 fL (ref 78.0–100.0)
Platelets: 234 10*3/uL (ref 150–400)
RBC: 4.98 MIL/uL (ref 3.87–5.11)
RDW: 13.1 % (ref 11.5–15.5)
WBC: 7.1 10*3/uL (ref 4.0–10.5)

## 2014-11-20 LAB — TROPONIN I: Troponin I: <0.03 ng/mL (ref <0.031)

## 2014-11-20 LAB — I-STAT TROPONIN, ED: Troponin i, poc: 0 ng/mL (ref 0.00–0.08)

## 2014-11-20 MED ORDER — NITROGLYCERIN 0.4 MG SL SUBL
0.4000 mg | SUBLINGUAL_TABLET | SUBLINGUAL | Status: AC | PRN
  Administered 2014-11-20: 0.4 mg via SUBLINGUAL

## 2014-11-20 MED ORDER — DIAZEPAM 5 MG PO TABS: 5.0000 mg | ORAL_TABLET | Freq: Four times a day (QID) | ORAL | Status: DC | PRN

## 2014-11-20 MED ORDER — SODIUM CHLORIDE 0.9 % IV SOLN
INTRAVENOUS | Status: DC
  Administered 2014-11-20 (×2): via INTRAVENOUS

## 2014-11-20 MED ORDER — LORAZEPAM 2 MG/ML IJ SOLN
1.0000 mg | Freq: Once | INTRAMUSCULAR | Status: AC
  Administered 2014-11-20: 1 mg via INTRAVENOUS
  Filled 2014-11-20: qty 1

## 2014-11-20 MED ORDER — ASPIRIN 81 MG PO CHEW
CHEWABLE_TABLET | ORAL | Status: AC
  Filled 2014-11-20: qty 4

## 2014-11-20 MED ORDER — ONDANSETRON HCL 4 MG/2ML IJ SOLN
4.0000 mg | Freq: Once | INTRAMUSCULAR | Status: AC
  Administered 2014-11-20: 4 mg via INTRAVENOUS
  Filled 2014-11-20: qty 2

## 2014-11-20 MED ORDER — IBUPROFEN 800 MG PO TABS: 800.0000 mg | ORAL_TABLET | Freq: Three times a day (TID) | ORAL | Status: DC | PRN

## 2014-11-20 MED ORDER — ASPIRIN 81 MG PO CHEW
324.0000 mg | CHEWABLE_TABLET | Freq: Once | ORAL | Status: AC
  Administered 2014-11-20: 324 mg via ORAL

## 2014-11-20 MED ORDER — NITROGLYCERIN 0.4 MG SL SUBL
SUBLINGUAL_TABLET | SUBLINGUAL | Status: AC
  Filled 2014-11-20: qty 1

## 2014-11-20 MED ORDER — MORPHINE SULFATE 4 MG/ML IJ SOLN
4.0000 mg | Freq: Once | INTRAMUSCULAR | Status: AC
  Administered 2014-11-20: 4 mg via INTRAVENOUS
  Filled 2014-11-20: qty 1

## 2014-11-20 NOTE — ED Notes (Signed)
No      carelink  Unit      Available   
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
## 2014-11-20 NOTE — ED Notes (Signed)
Pt  Placed  On  Cardiac  Monitor  Nasal  o2  At  2 l  /  Min   
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
## 2014-11-20 NOTE — ED Provider Notes (Signed)
  Physical Exam  BP 107/73 mmHg  Pulse 60  Temp(Src) 98 F (36.7 C) (Oral)  Resp 12  SpO2 100%  Physical Exam  Constitutional: She is oriented to person, place, and time. She appears well-developed and well-nourished. No distress.  HENT:  Head: Normocephalic and atraumatic.  Eyes: Conjunctivae are normal. No scleral icterus.  Neck: Normal range of motion.  Cardiovascular: Normal rate, regular rhythm and normal heart sounds.  Exam reveals no gallop and no friction rub.   No murmur heard. Pulmonary/Chest: Effort normal and breath sounds normal. No respiratory distress. She exhibits tenderness (central chest tender, reproduces pain). .  Abdominal: Soft. Bowel sounds are normal. She exhibits no distension and no mass. There is no tenderness. There is no guarding.  Neurological: She is alert and oriented to person, place, and time.  Skin: Skin is warm and dry. She is not diaphoretic.    ED Course  Procedures  MDM 5:11 PM BP 107/73 mmHg  Pulse 60  Temp(Src) 98 F (36.7 C) (Oral)  Resp 12  SpO2 100%   [place]. Patient here with complaint of high levels of stress, headache, constant chest pain. Initial workup an EKG negative. Awaiting second troponin. Plan is to discharge patient if repeat troponin negative. She is in agreement with plan of care. Follow up with her cardiologist.    Arthor CaptainAbigail Prakriti Carignan, PA-C 11/22/14 1408  Toy BakerAnthony T Allen, MD 11/25/14 0800

## 2014-11-20 NOTE — ED Notes (Signed)
No  Change    In  pts   Level of  concoussness

## 2014-11-20 NOTE — ED Notes (Signed)
Patient transported to CT 
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
## 2014-11-20 NOTE — ED Notes (Addendum)
[facility] [place]. Pt. Has a "bee bee" on rt. Side of head, between skin and skull.  Using a phone on left ear.  Pt. Started c/o left arm numbness and top of left side of lip.  Pt states, "central cp for a while. Took ibuprofen. Ems gave 324 mg asa and ntg. Sl. 0.4 mg. Had nausea but relieved. Pain unchanged with ntg. Cp when going to stand.

## 2014-11-20 NOTE — Discharge Instructions (Signed)
Read the information below.  Use the prescribed medication as directed.  Please discuss all new medications with your pharmacist.  You may return to the Emergency Department at any time for worsening condition or any new symptoms that concern you.  If you develop worsening chest pain, shortness of breath, fever, you pass out, or become weak or dizzy, return to the ER for a recheck.     You are having a headache. No specific cause was found today for your headache. It may have been a migraine or other cause of headache. Stress, anxiety, fatigue, and depression are common triggers for headaches. Your headache today does not appear to be life-threatening or require hospitalization, but often the exact cause of headaches is not determined in the emergency department. Therefore, follow-up with your doctor is very important to find out what may have caused your headache, and whether or not you need any further diagnostic testing or treatment. Sometimes headaches can appear benign (not harmful), but then more serious symptoms can develop which should prompt an immediate re-evaluation by your doctor or the emergency department. SEEK MEDICAL ATTENTION IF: You develop possible problems with medications prescribed.  The medications don't resolve your headache, if it recurs , or if you have multiple episodes of vomiting or can't take fluids. You have a change from the usual headache. RETURN IMMEDIATELY IF you develop a sudden, severe headache or confusion, become poorly responsive or faint, develop a fever above 100.7F or problem breathing, have a change in speech, vision, swallowing, or understanding, or develop new weakness, numbness, tingling, incoordination, or have a seizure.    Chest Pain (Nonspecific) It is often hard to give a specific diagnosis for the cause of chest pain. There is always a chance that your pain could be related to something serious, such as a heart attack or a blood clot in the lungs. You  need to follow up with your health care provider for further evaluation. CAUSES   Heartburn.  Pneumonia or bronchitis.  Anxiety or stress.  Inflammation around your heart (pericarditis) or lung (pleuritis or pleurisy).  A blood clot in the lung.  A collapsed lung (pneumothorax). It can develop suddenly on its own (spontaneous pneumothorax) or from trauma to the chest.  Shingles infection (herpes zoster virus). The chest wall is composed of bones, muscles, and cartilage. Any of these can be the source of the pain.  The bones can be bruised by injury.  The muscles or cartilage can be strained by coughing or overwork.  The cartilage can be affected by inflammation and become sore (costochondritis). DIAGNOSIS  Lab tests or other studies may be needed to find the cause of your pain. Your health care provider may have you take a test called an ambulatory electrocardiogram (ECG). An ECG records your heartbeat patterns over a 24-hour period. You may also have other tests, such as:  Transthoracic echocardiogram (TTE). During echocardiography, sound waves are used to evaluate how blood flows through your heart.  Transesophageal echocardiogram (TEE).  Cardiac monitoring. This allows your health care provider to monitor your heart rate and rhythm in real time.  Holter monitor. This is a portable device that records your heartbeat and can help diagnose heart arrhythmias. It allows your health care provider to track your heart activity for several days, if needed.  Stress tests by exercise or by giving medicine that makes the heart beat faster. TREATMENT   Treatment depends on what may be causing your chest pain. Treatment may include:  Acid blockers for heartburn.  Anti-inflammatory medicine.  Pain medicine for inflammatory conditions.  Antibiotics if an infection is present.  You may be advised to change lifestyle habits. This includes stopping smoking and avoiding alcohol,  caffeine, and chocolate.  You may be advised to keep your head raised (elevated) when sleeping. This reduces the chance of acid going backward from your stomach into your esophagus. Most of the time, nonspecific chest pain will improve within 2-3 days with rest and mild pain medicine.  HOME CARE INSTRUCTIONS   If antibiotics were prescribed, take them as directed. Finish them even if you start to feel better.  For the next few days, avoid physical activities that bring on chest pain. Continue physical activities as directed.  Do not use any tobacco products, including cigarettes, chewing tobacco, or electronic cigarettes.  Avoid drinking alcohol.  Only take medicine as directed by your health care provider.  Follow your health care provider's suggestions for further testing if your chest pain does not go away.  Keep any follow-up appointments you made. If you do not go to an appointment, you could develop lasting (chronic) problems with pain. If there is any problem keeping an appointment, call to reschedule. SEEK MEDICAL CARE IF:   Your chest pain does not go away, even after treatment.  You have a rash with blisters on your chest.  You have a fever. SEEK IMMEDIATE MEDICAL CARE IF:   You have increased chest pain or pain that spreads to your arm, neck, jaw, back, or abdomen.  You have shortness of breath.  You have an increasing cough, or you cough up blood.  You have severe back or abdominal pain.  You feel nauseous or vomit.  You have severe weakness.  You faint.  You have chills. This is an emergency. Do not wait to see if the pain will go away. Get medical help at once. Call your local emergency services (911 in [place] Do not drive yourself to the hospital. MAKE SURE YOU:   Understand these instructions.  Will watch your condition.  Will get help right away if you are not doing well or get worse. Document Released: 07/14/2005 Document Revised: 10/09/2013  Document Reviewed: 05/09/2008 [facility] Patient Information 2015 [place], [place]. This information is not intended to replace advice given to you by your health care provider. Make sure you discuss any questions you have with your health care provider.  Headaches, Frequently Asked Questions MIGRAINE HEADACHES Q: What is migraine? What causes it? How can I treat it? A: Generally, migraine headaches begin as a dull ache. Then they develop into a constant, throbbing, and pulsating pain. You may experience pain at the temples. You may experience pain at the front or back of one or both sides of the head. The pain is usually accompanied by a combination of:  Nausea.  Vomiting.  Sensitivity to light and noise. Some people (about 15%) experience an aura (see below) before an attack. The cause of migraine is believed to be chemical reactions in the brain. Treatment for migraine may include over-the-counter or prescription medications. It may also include self-help techniques. These include relaxation training and biofeedback.  Q: What is an aura? A: About 15% of people with migraine get an "aura". This is a sign of neurological symptoms that occur before a migraine headache. You may see wavy or jagged lines, dots, or flashing lights. You might experience tunnel vision or blind spots in one or both eyes. The aura can include visual or  auditory hallucinations (something imagined). It may include disruptions in smell (such as strange odors), taste or touch. Other symptoms include:  Numbness.  A "pins and needles" sensation.  Difficulty in recalling or speaking the correct word. These neurological events may last as long as 60 minutes. These symptoms will fade as the headache begins. Q: What is a trigger? A: Certain physical or environmental factors can lead to or "trigger" a migraine. These include:  Foods.  Hormonal changes.  Weather.  Stress. It is important to remember that triggers are  different for everyone. To help prevent migraine attacks, you need to figure out which triggers affect you. Keep a headache diary. This is a good way to track triggers. The diary will help you talk to your healthcare professional about your condition. Q: Does weather affect migraines? A: Bright sunshine, hot, humid conditions, and drastic changes in barometric pressure may lead to, or "trigger," a migraine attack in some people. But studies have shown that weather does not act as a trigger for everyone with migraines. Q: What is the link between migraine and hormones? A: Hormones start and regulate many of your body's functions. Hormones keep your body in balance within a constantly changing environment. The levels of hormones in your body are unbalanced at times. Examples are during menstruation, pregnancy, or menopause. That can lead to a migraine attack. In fact, about three quarters of all women with migraine report that their attacks are related to the menstrual cycle.  Q: Is there an increased risk of stroke for migraine sufferers? A: The likelihood of a migraine attack causing a stroke is very remote. That is not to say that migraine sufferers cannot have a stroke associated with their migraines. In persons under age 85, the most common associated factor for stroke is migraine headache. But over the course of a person's normal life span, the occurrence of migraine headache may actually be associated with a reduced risk of dying from cerebrovascular disease due to stroke.  Q: What are acute medications for migraine? A: Acute medications are used to treat the pain of the headache after it has started. Examples over-the-counter medications, NSAIDs, ergots, and triptans.  Q: What are the triptans? A: Triptans are the newest class of abortive medications. They are specifically targeted to treat migraine. Triptans are vasoconstrictors. They moderate some chemical reactions in the brain. The triptans work  on receptors in your brain. Triptans help to restore the balance of a neurotransmitter called serotonin. Fluctuations in levels of serotonin are thought to be a main cause of migraine.  Q: Are over-the-counter medications for migraine effective? A: Over-the-counter, or "OTC," medications may be effective in relieving mild to moderate pain and associated symptoms of migraine. But you should see your caregiver before beginning any treatment regimen for migraine.  Q: What are preventive medications for migraine? A: Preventive medications for migraine are sometimes referred to as "prophylactic" treatments. They are used to reduce the frequency, severity, and length of migraine attacks. Examples of preventive medications include antiepileptic medications, antidepressants, beta-blockers, calcium channel blockers, and NSAIDs (nonsteroidal anti-inflammatory drugs). Q: Why are anticonvulsants used to treat migraine? A: During the past few years, there has been an increased interest in antiepileptic drugs for the prevention of migraine. They are sometimes referred to as "anticonvulsants". Both epilepsy and migraine may be caused by similar reactions in the brain.  Q: Why are antidepressants used to treat migraine? A: Antidepressants are typically used to treat people with depression. They may reduce migraine  frequency by regulating chemical levels, such as serotonin, in the brain.  Q: What alternative therapies are used to treat migraine? A: The term "alternative therapies" is often used to describe treatments considered outside the scope of conventional Western medicine. Examples of alternative therapy include acupuncture, acupressure, and yoga. Another common alternative treatment is herbal therapy. Some herbs are believed to relieve headache pain. Always discuss alternative therapies with your caregiver before proceeding. Some herbal products contain arsenic and other toxins. TENSION HEADACHES Q: What is a  tension-type headache? What causes it? How can I treat it? A: Tension-type headaches occur randomly. They are often the result of temporary stress, anxiety, fatigue, or anger. Symptoms include soreness in your temples, a tightening band-like sensation around your head (a "vice-like" ache). Symptoms can also include a pulling feeling, pressure sensations, and contracting head and neck muscles. The headache begins in your forehead, temples, or the back of your head and neck. Treatment for tension-type headache may include over-the-counter or prescription medications. Treatment may also include self-help techniques such as relaxation training and biofeedback. CLUSTER HEADACHES Q: What is a cluster headache? What causes it? How can I treat it? A: Cluster headache gets its name because the attacks come in groups. The pain arrives with little, if any, warning. It is usually on one side of the head. A tearing or bloodshot eye and a runny nose on the same side of the headache may also accompany the pain. Cluster headaches are believed to be caused by chemical reactions in the brain. They have been described as the most severe and intense of any headache type. Treatment for cluster headache includes prescription medication and oxygen. SINUS HEADACHES Q: What is a sinus headache? What causes it? How can I treat it? A: When a cavity in the bones of the face and skull (a sinus) becomes inflamed, the inflammation will cause localized pain. This condition is usually the result of an allergic reaction, a tumor, or an infection. If your headache is caused by a sinus blockage, such as an infection, you will probably have a fever. An x-ray will confirm a sinus blockage. Your caregiver's treatment might include antibiotics for the infection, as well as antihistamines or decongestants.  REBOUND HEADACHES Q: What is a rebound headache? What causes it? How can I treat it? A: A pattern of taking acute headache medications too  often can lead to a condition known as "rebound headache." A pattern of taking too much headache medication includes taking it more than 2 days per week or in excessive amounts. That means more than the label or a caregiver advises. With rebound headaches, your medications not only stop relieving pain, they actually begin to cause headaches. Doctors treat rebound headache by tapering the medication that is being overused. Sometimes your caregiver will gradually substitute a different type of treatment or medication. Stopping may be a challenge. Regularly overusing a medication increases the potential for serious side effects. Consult a caregiver if you regularly use headache medications more than 2 days per week or more than the label advises. ADDITIONAL QUESTIONS AND ANSWERS Q: What is biofeedback? A: Biofeedback is a self-help treatment. Biofeedback uses special equipment to monitor your body's involuntary physical responses. Biofeedback monitors:  Breathing.  Pulse.  Heart rate.  Temperature.  Muscle tension.  Brain activity. Biofeedback helps you refine and perfect your relaxation exercises. You learn to control the physical responses that are related to stress. Once the technique has been mastered, you do not need the equipment  any more. Q: Are headaches hereditary? A: Four out of five (80%) of people that suffer report a family history of migraine. Scientists are not sure if this is genetic or a family predisposition. Despite the uncertainty, a child has a 50% chance of having migraine if one parent suffers. The child has a 75% chance if both parents suffer.  Q: Can children get headaches? A: By the time they reach high school, most young people have experienced some type of headache. Many safe and effective approaches or medications can prevent a headache from occurring or stop it after it has begun.  Q: What type of doctor should I see to diagnose and treat my headache? A: Start with  your primary caregiver. Discuss his or her experience and approach to headaches. Discuss methods of classification, diagnosis, and treatment. Your caregiver may decide to recommend you to a headache specialist, depending upon your symptoms or other physical conditions. Having diabetes, allergies, etc., may require a more comprehensive and inclusive approach to your headache. The National Head[employer] Foundation will provide, upon request, a list of [facility] physician members in your state. Document Released: 12/25/2003 Document Revised: 12/27/2011 Document Reviewed: 06/03/2008 [facility] Patient Information 2015 [place], [place]. This information is not intended to replace advice given to you by your health care provider. Make sure you discuss any questions you have with your health care provider.

## 2014-11-20 NOTE — Discharge Instructions (Signed)
We have determined that your problem requires further evaluation in the emergency department.  We will take care of your transport there.  Once at the emergency department, you will be evaluated by a provider and they will order whatever treatment or tests they deem necessary.  We cannot guarantee that they will do any specific test or do any specific treatment.  ° °

## 2014-11-27 DIAGNOSIS — F319 Bipolar disorder, unspecified: Secondary | ICD-10-CM | POA: Diagnosis not present

## 2014-11-27 DIAGNOSIS — R05 Cough: Secondary | ICD-10-CM | POA: Diagnosis not present

## 2014-11-27 DIAGNOSIS — Z23 Encounter for immunization: Secondary | ICD-10-CM | POA: Diagnosis not present

## 2015-05-08 DIAGNOSIS — L818 Other specified disorders of pigmentation: Secondary | ICD-10-CM | POA: Diagnosis not present

## 2015-05-13 DIAGNOSIS — I10 Essential (primary) hypertension: Secondary | ICD-10-CM | POA: Diagnosis not present

## 2015-05-13 DIAGNOSIS — E782 Mixed hyperlipidemia: Secondary | ICD-10-CM | POA: Diagnosis not present

## 2015-05-13 DIAGNOSIS — F411 Generalized anxiety disorder: Secondary | ICD-10-CM | POA: Diagnosis not present

## 2015-05-20 DIAGNOSIS — F319 Bipolar disorder, unspecified: Secondary | ICD-10-CM | POA: Diagnosis not present

## 2015-06-02 DIAGNOSIS — F319 Bipolar disorder, unspecified: Secondary | ICD-10-CM | POA: Diagnosis not present

## 2015-06-16 DIAGNOSIS — F319 Bipolar disorder, unspecified: Secondary | ICD-10-CM | POA: Diagnosis not present

## 2015-06-24 DIAGNOSIS — F319 Bipolar disorder, unspecified: Secondary | ICD-10-CM | POA: Diagnosis not present

## 2015-06-27 DIAGNOSIS — F319 Bipolar disorder, unspecified: Secondary | ICD-10-CM | POA: Diagnosis not present

## 2015-07-02 DIAGNOSIS — F319 Bipolar disorder, unspecified: Secondary | ICD-10-CM | POA: Diagnosis not present

## 2015-07-17 DIAGNOSIS — I10 Essential (primary) hypertension: Secondary | ICD-10-CM | POA: Diagnosis not present

## 2015-07-17 DIAGNOSIS — M545 Low back pain: Secondary | ICD-10-CM | POA: Diagnosis not present

## 2015-07-17 DIAGNOSIS — Z131 Encounter for screening for diabetes mellitus: Secondary | ICD-10-CM | POA: Diagnosis not present

## 2015-07-17 DIAGNOSIS — I119 Hypertensive heart disease without heart failure: Secondary | ICD-10-CM | POA: Diagnosis not present

## 2015-07-17 DIAGNOSIS — Z23 Encounter for immunization: Secondary | ICD-10-CM | POA: Diagnosis not present

## 2015-07-17 DIAGNOSIS — Z9189 Other specified personal risk factors, not elsewhere classified: Secondary | ICD-10-CM | POA: Diagnosis not present

## 2015-07-17 DIAGNOSIS — J45909 Unspecified asthma, uncomplicated: Secondary | ICD-10-CM | POA: Diagnosis not present

## 2015-07-17 DIAGNOSIS — Z5181 Encounter for therapeutic drug level monitoring: Secondary | ICD-10-CM | POA: Diagnosis not present

## 2015-07-17 DIAGNOSIS — Z0000 Encounter for general adult medical examination without abnormal findings: Secondary | ICD-10-CM | POA: Diagnosis not present

## 2015-07-17 DIAGNOSIS — Z136 Encounter for screening for cardiovascular disorders: Secondary | ICD-10-CM | POA: Diagnosis not present

## 2015-07-17 DIAGNOSIS — Z96653 Presence of artificial knee joint, bilateral: Secondary | ICD-10-CM | POA: Diagnosis not present

## 2015-07-17 DIAGNOSIS — R7309 Other abnormal glucose: Secondary | ICD-10-CM | POA: Diagnosis not present

## 2015-07-17 DIAGNOSIS — Z01118 Encounter for examination of ears and hearing with other abnormal findings: Secondary | ICD-10-CM | POA: Diagnosis not present

## 2015-07-25 DIAGNOSIS — M545 Low back pain: Secondary | ICD-10-CM | POA: Diagnosis not present

## 2015-07-25 DIAGNOSIS — R0602 Shortness of breath: Secondary | ICD-10-CM | POA: Diagnosis not present

## 2015-07-25 DIAGNOSIS — Z9189 Other specified personal risk factors, not elsewhere classified: Secondary | ICD-10-CM | POA: Diagnosis not present

## 2015-07-25 DIAGNOSIS — M79609 Pain in unspecified limb: Secondary | ICD-10-CM | POA: Diagnosis not present

## 2015-07-25 DIAGNOSIS — I10 Essential (primary) hypertension: Secondary | ICD-10-CM | POA: Diagnosis not present

## 2015-07-25 DIAGNOSIS — I119 Hypertensive heart disease without heart failure: Secondary | ICD-10-CM | POA: Diagnosis not present

## 2015-07-25 DIAGNOSIS — E785 Hyperlipidemia, unspecified: Secondary | ICD-10-CM | POA: Diagnosis not present

## 2015-07-25 DIAGNOSIS — Z96653 Presence of artificial knee joint, bilateral: Secondary | ICD-10-CM | POA: Diagnosis not present

## 2015-07-25 DIAGNOSIS — J45909 Unspecified asthma, uncomplicated: Secondary | ICD-10-CM | POA: Diagnosis not present

## 2015-07-28 DIAGNOSIS — Z124 Encounter for screening for malignant neoplasm of cervix: Secondary | ICD-10-CM | POA: Diagnosis not present

## 2015-07-28 DIAGNOSIS — Z113 Encounter for screening for infections with a predominantly sexual mode of transmission: Secondary | ICD-10-CM | POA: Diagnosis not present

## 2015-07-28 DIAGNOSIS — I10 Essential (primary) hypertension: Secondary | ICD-10-CM | POA: Diagnosis not present

## 2015-07-28 DIAGNOSIS — R739 Hyperglycemia, unspecified: Secondary | ICD-10-CM | POA: Diagnosis not present

## 2015-07-28 DIAGNOSIS — R7301 Impaired fasting glucose: Secondary | ICD-10-CM | POA: Diagnosis not present

## 2015-07-28 DIAGNOSIS — J45909 Unspecified asthma, uncomplicated: Secondary | ICD-10-CM | POA: Diagnosis not present

## 2015-07-28 DIAGNOSIS — R7303 Prediabetes: Secondary | ICD-10-CM | POA: Diagnosis not present

## 2015-07-28 DIAGNOSIS — Z96653 Presence of artificial knee joint, bilateral: Secondary | ICD-10-CM | POA: Diagnosis not present

## 2015-07-28 DIAGNOSIS — I119 Hypertensive heart disease without heart failure: Secondary | ICD-10-CM | POA: Diagnosis not present

## 2015-07-28 DIAGNOSIS — Z5181 Encounter for therapeutic drug level monitoring: Secondary | ICD-10-CM | POA: Diagnosis not present

## 2015-07-28 DIAGNOSIS — E785 Hyperlipidemia, unspecified: Secondary | ICD-10-CM | POA: Diagnosis not present

## 2015-07-28 DIAGNOSIS — M545 Low back pain: Secondary | ICD-10-CM | POA: Diagnosis not present

## 2015-07-28 DIAGNOSIS — G629 Polyneuropathy, unspecified: Secondary | ICD-10-CM | POA: Diagnosis not present

## 2015-08-04 DIAGNOSIS — F319 Bipolar disorder, unspecified: Secondary | ICD-10-CM | POA: Diagnosis not present

## 2015-08-07 DIAGNOSIS — I10 Essential (primary) hypertension: Secondary | ICD-10-CM | POA: Diagnosis not present

## 2015-08-07 DIAGNOSIS — Z9189 Other specified personal risk factors, not elsewhere classified: Secondary | ICD-10-CM | POA: Diagnosis not present

## 2015-08-07 DIAGNOSIS — Z96653 Presence of artificial knee joint, bilateral: Secondary | ICD-10-CM | POA: Diagnosis not present

## 2015-08-07 DIAGNOSIS — E785 Hyperlipidemia, unspecified: Secondary | ICD-10-CM | POA: Diagnosis not present

## 2015-08-07 DIAGNOSIS — I119 Hypertensive heart disease without heart failure: Secondary | ICD-10-CM | POA: Diagnosis not present

## 2015-08-07 DIAGNOSIS — Z5181 Encounter for therapeutic drug level monitoring: Secondary | ICD-10-CM | POA: Diagnosis not present

## 2015-08-07 DIAGNOSIS — J45909 Unspecified asthma, uncomplicated: Secondary | ICD-10-CM | POA: Diagnosis not present

## 2015-08-07 DIAGNOSIS — M545 Low back pain: Secondary | ICD-10-CM | POA: Diagnosis not present

## 2015-08-14 DIAGNOSIS — I10 Essential (primary) hypertension: Secondary | ICD-10-CM | POA: Diagnosis not present

## 2015-08-14 DIAGNOSIS — J45909 Unspecified asthma, uncomplicated: Secondary | ICD-10-CM | POA: Diagnosis not present

## 2015-08-14 DIAGNOSIS — R7303 Prediabetes: Secondary | ICD-10-CM | POA: Diagnosis not present

## 2015-08-14 DIAGNOSIS — E785 Hyperlipidemia, unspecified: Secondary | ICD-10-CM | POA: Diagnosis not present

## 2015-08-14 DIAGNOSIS — I119 Hypertensive heart disease without heart failure: Secondary | ICD-10-CM | POA: Diagnosis not present

## 2015-08-14 DIAGNOSIS — G629 Polyneuropathy, unspecified: Secondary | ICD-10-CM | POA: Diagnosis not present

## 2015-08-14 DIAGNOSIS — E781 Pure hyperglyceridemia: Secondary | ICD-10-CM | POA: Diagnosis not present

## 2015-08-14 DIAGNOSIS — Z96653 Presence of artificial knee joint, bilateral: Secondary | ICD-10-CM | POA: Diagnosis not present

## 2015-08-19 DIAGNOSIS — F319 Bipolar disorder, unspecified: Secondary | ICD-10-CM | POA: Diagnosis not present

## 2015-08-20 ENCOUNTER — Encounter: Payer: Self-pay | Admitting: Neurology

## 2015-08-20 ENCOUNTER — Ambulatory Visit (INDEPENDENT_AMBULATORY_CARE_PROVIDER_SITE_OTHER): Payer: Medicare Other | Admitting: Neurology

## 2015-08-20 VITALS — BP 110/70 | HR 74 | Resp 18 | Ht 64.0 in | Wt 236.0 lb

## 2015-08-20 DIAGNOSIS — R202 Paresthesia of skin: Secondary | ICD-10-CM | POA: Diagnosis not present

## 2015-08-20 DIAGNOSIS — E538 Deficiency of other specified B group vitamins: Secondary | ICD-10-CM | POA: Diagnosis not present

## 2015-08-20 HISTORY — DX: Paresthesia of skin: R20.2

## 2015-08-20 NOTE — Patient Instructions (Signed)
We will check EMG and NCV evaluation to check nerve function.   Paresthesia Paresthesia is an abnormal burning or prickling sensation. This sensation is generally felt in the hands, arms, legs, or feet. However, it may occur in any part of the body. Usually, it is not painful. The feeling may be described as:  Tingling or numbness.  Pins and needles.  Skin crawling.  Buzzing.  Limbs falling asleep.  Itching. Most people experience temporary (transient) paresthesia at some time in their lives. Paresthesia may occur when you breathe too quickly (hyperventilation). It can also occur without any apparent cause. Commonly, paresthesia occurs when pressure is placed on a nerve. The sensation quickly goes away after the pressure is removed. For some people, however, paresthesia is a long-lasting (chronic) condition that is caused by an underlying disorder. If you continue to have paresthesia, you may need further medical evaluation. HOME CARE INSTRUCTIONS Watch your condition for any changes. Taking the following actions may help to lessen any discomfort that you are feeling:  Avoid drinking alcohol.  Try acupuncture or massage to help relieve your symptoms.  Keep all follow-up visits as directed by your health care provider. This is important. SEEK MEDICAL CARE IF:  You continue to have episodes of paresthesia.  Your burning or prickling feeling gets worse when you walk.  You have pain, cramps, or dizziness.  You develop a rash. SEEK IMMEDIATE MEDICAL CARE IF:  You feel weak.  You have trouble walking or moving.  You have problems with speech, understanding, or vision.  You feel confused.  You cannot control your bladder or bowel movements.  You have numbness after an injury.  You faint.   This information is not intended to replace advice given to you by your health care provider. Make sure you discuss any questions you have with your health care provider.    Document Released: 09/24/2002 Document Revised: 02/18/2015 Document Reviewed: 09/30/2014 [employer] Interactive Patient Education 2016 [employer] Paresthesia Paresthesia is an abnormal burning or prickling sensation. This sensation is generally felt in the hands, arms, legs, or feet. However, it may occur in any part of the body. Usually, it is not painful. The feeling may be described as:  Tingling or numbness.  Pins and needles.  Skin crawling.  Buzzing.  Limbs falling asleep.  Itching. Most people experience temporary (transient) paresthesia at some time in their lives. Paresthesia may occur when you breathe too quickly (hyperventilation). It can also occur without any apparent cause. Commonly, paresthesia occurs when pressure is placed on a nerve. The sensation quickly goes away after the pressure is removed. For some people, however, paresthesia is a long-lasting (chronic) condition that is caused by an underlying disorder. If you continue to have paresthesia, you may need further medical evaluation. HOME CARE INSTRUCTIONS Watch your condition for any changes. Taking the following actions may help to lessen any discomfort that you are feeling:  Avoid drinking alcohol.  Try acupuncture or massage to help relieve your symptoms.  Keep all follow-up visits as directed by your health care provider. This is important. SEEK MEDICAL CARE IF:  You continue to have episodes of paresthesia.  Your burning or prickling feeling gets worse when you walk.  You have pain, cramps, or dizziness.  You develop a rash. SEEK IMMEDIATE MEDICAL CARE IF:  You feel weak.  You have trouble walking or moving.  You have problems with speech, understanding, or vision.  You feel confused.  You cannot control your bladder or  bowel movements.  You have numbness after an injury.  You faint.   This information is not intended to replace advice given to you by your health care provider. Make  sure you discuss any questions you have with your health care provider.   Document Released: 09/24/2002 Document Revised: 02/18/2015 Document Reviewed: 09/30/2014 [employer] Interactive Patient Education [employer]

## 2015-08-20 NOTE — Progress Notes (Signed)
Reason for visit: Paresthesias  Referring physician: Dr. Ronalee Belts Jo Allen is a 49 y.o. female  History of present illness:  Jo Allen is a 49 year old right-handed black female with a history of paresthesias and discomfort in the hands that began about 3 months ago. The patient has had difficulty waking up with discomfort, with the sensation of swelling in the hands. The numbness includes the entire hand. She also reports some neck and shoulder discomfort that also began around the same time with some discomfort down the arms. She has also had some numbness and burning sensations in the feet. She has a history of prediabetes. She has had some mild gait instability, and very occasional falls. The patient reports some feeling of being tired in the legs with prolonged walking or exercise. The patient reports problems with constipation, some urgency of the bladder. She also has developed trigger fingers affecting the middle fingers bilaterally. She has been placed on gabapentin without significant improvement. She is sent to this office for an evaluation.  Past Medical History  Diagnosis Date  . Hypertension   . Transfusion history     39yrs ago  . GERD (gastroesophageal reflux disease)   . CAD (coronary artery disease)   . Asthma   . Obesity   . Heart disease   . Paresthesia 08/20/2015  . Prediabetes     Past Surgical History  Procedure Laterality Date  . Total knee arthroplasty Bilateral   . Joint replacement Bilateral   . Breast biopsy with sentinel lymph node biopsy and needle localization Left   . Colonoscopy with propofol N/A 02/19/2014    Procedure: COLONOSCOPY WITH PROPOFOL;  Surgeon: Charolett Bumpers, MD;  Location: [facility];  Service: Endoscopy;  Laterality: N/A;  . Angioplasty      Family History  Problem Relation Age of Onset  . Hypertension Mother   . Diabetes Mother   . Stroke Father   . Depression Brother     Social history:  reports that she  has never smoked. She does not have any smokeless tobacco history on file. She reports that she does not drink alcohol or use illicit drugs.  Medications:  Prior to Admission medications   Medication Sig Start Date End Date Taking? Authorizing Provider  atorvastatin (LIPITOR) 10 MG tablet Take 10 mg by mouth every morning.   Yes Historical Provider, MD  clonazePAM (KLONOPIN) 0.5 MG tablet Take 0.5 mg by mouth 3 (three) times daily as needed for anxiety.   Yes Historical Provider, MD  CRANBERRY PO Take 1 tablet by mouth daily.   Yes Historical Provider, MD  diazepam (VALIUM) 5 MG tablet Take 1 tablet (5 mg total) by mouth every 6 (six) hours as needed for anxiety or muscle spasms. 11/20/14  Yes Trixie Dredge, PA-C  gabapentin (NEURONTIN) 300 MG capsule Take 300 mg by mouth 3 (three) times daily.   Yes Historical Provider, MD  hydrochlorothiazide (MICROZIDE) 12.5 MG capsule Take 12.5 mg by mouth every morning.   Yes Historical Provider, MD  HYDROcodone-acetaminophen (NORCO) 5-325 MG per tablet Take 1-2 tablets by mouth every 6 (six) hours as needed for severe pain. 05/06/14  Yes Doug Sou, MD  lamoTRIgine (LAMICTAL) 100 MG tablet Take 100 mg by mouth daily.   Yes Historical Provider, MD  meloxicam (MOBIC) 15 MG tablet Take 15 mg by mouth daily.   Yes Historical Provider, MD  mirtazapine (REMERON) 15 MG tablet Take 15 mg by mouth at bedtime.   Yes Historical  Provider, MD  phentermine 37.5 MG capsule Take 37.5 mg by mouth every morning.   Yes Historical Provider, MD  QUEtiapine (SEROQUEL) 50 MG tablet Take 50 mg by mouth at bedtime.   Yes Historical Provider, MD  tiZANidine (ZANAFLEX) 4 MG tablet Take 4 mg by mouth every 8 (eight) hours as needed for muscle spasms.   Yes Historical Provider, MD  verapamil (VERELAN PM) 360 MG 24 hr capsule Take 360 mg by mouth every morning.   Yes Historical Provider, MD  vitamin B-12 (CYANOCOBALAMIN) 1000 MCG tablet Take 1,000 mcg by mouth daily.   Yes Historical  Provider, MD  vitamin E 100 UNIT capsule Take 100 Units by mouth daily.   Yes Historical Provider, MD  [facility] Liver Oil CAPS Take 1 capsule by mouth daily.    Historical Provider, MD  hydrochlorothiazide (HYDRODIURIL) 25 MG tablet Take 25 mg by mouth daily.    Historical Provider, MD  ibuprofen (ADVIL,MOTRIN) 800 MG tablet Take 1 tablet (800 mg total) by mouth every 8 (eight) hours as needed for mild pain or moderate pain. Patient not taking: Reported on 08/20/2015 11/20/14   Trixie Dredge, PA-C      Allergies  Allergen Reactions  . Morphine And Related Nausea And Vomiting  . Penicillins Hives    ROS:  Out of a complete 14 system review of symptoms, the patient complains only of the following symptoms, and all other reviewed systems are negative.  Fevers, chills, weight gain Swelling in the legs Ringing in the ears, difficulty swallowing Moles Intermittent blurred vision Shortness of breath, wheezing Constipation Feeling cold Joint pain, joint swelling, muscle cramps, aching muscles Allergies, runny nose Numbness, weakness Insomnia, restless legs  Blood pressure 110/70, pulse 74, resp. rate 18, height  (1.626 m), weight 236 lb (107.049 kg).  Physical Exam  General: The patient is alert and cooperative at the time of the examination. The patient is markedly obese.  Eyes: Pupils are equal, round, and reactive to light. Discs are flat bilaterally.  Neck: The neck is supple, no carotid bruits are noted.  Respiratory: The respiratory examination is clear.  Cardiovascular: The cardiovascular examination reveals a regular rate and rhythm, no obvious murmurs or rubs are noted.  Skin: Extremities are without significant edema.  Neurologic Exam  Mental status: The patient is alert and oriented x 3 at the time of the examination. The patient has apparent normal recent and remote memory, with an apparently normal attention span and concentration ability.  Cranial nerves: Facial  symmetry is present. There is good sensation of the face to pinprick and soft touch bilaterally. The strength of the facial muscles and the muscles to head turning and shoulder shrug are normal bilaterally. Speech is well enunciated, no aphasia or dysarthria is noted. Extraocular movements are full. Visual fields are full. The tongue is midline, and the patient has symmetric elevation of the soft palate. No obvious hearing deficits are noted.  Motor: The motor testing reveals 5 over 5 strength of all 4 extremities. Good symmetric motor tone is noted throughout.  Sensory: Sensory testing is intact to pinprick, soft touch, vibration sensation, and position sense on all 4 extremities, with exception that there is a stocking pattern pinprick sensory deficit up to the knees bilaterally. No evidence of extinction is noted.  Coordination: Cerebellar testing reveals good finger-nose-finger and heel-to-shin bilaterally. Tinel's sign is positive at the wrists bilaterally.   Gait and station: Gait is normal. Tandem gait is slightly unsteady. Romberg is negative, but  is unsteady. No drift is seen.  Reflexes: Deep tendon reflexes are symmetric, but are depressed bilaterally. Toes are downgoing bilaterally.   Assessment/Plan:  1. History of prediabetes  2. Paresthesias, all 4 extremities, peripheral neuropathy, probable carpal tunnel syndrome  3. Trigger fingers, bilateral middle fingers  The patient will be evaluated for the above symptoms. She will be sent for blood work today, and nerve conduction studies will be done on both arms, and one leg. EMG will be done on one arm. She will follow-up for the study.  Jo Palau. Keith Deklyn Gibbon MD 08/20/2015 12:44 PM  [facility] [address] [place], [place] [address]  Phone [phone] Fax [phone]

## 2015-08-22 LAB — MULTIPLE MYELOMA PANEL, SERUM
ALBUMIN/GLOB SERPL: 1.1 (ref 0.7–1.7)
ALPHA2 GLOB SERPL ELPH-MCNC: 0.7 g/dL (ref 0.4–1.0)
Albumin SerPl Elph-Mcnc: 3.9 g/dL (ref 2.9–4.4)
Alpha 1: 0.2 g/dL (ref 0.0–0.4)
B-GLOBULIN SERPL ELPH-MCNC: 1.4 g/dL — AB (ref 0.7–1.3)
GAMMA GLOB SERPL ELPH-MCNC: 1.3 g/dL (ref 0.4–1.8)
GLOBULIN, TOTAL: 3.6 g/dL (ref 2.2–3.9)
IGG (IMMUNOGLOBIN G), SERUM: 1279 mg/dL (ref 700–1600)
IGM (IMMUNOGLOBULIN M), SRM: 142 mg/dL (ref 26–217)
IgA/Immunoglobulin A, Serum: 301 mg/dL (ref 87–352)
Total Protein: 7.5 g/dL (ref 6.0–8.5)

## 2015-08-22 LAB — B. BURGDORFI ANTIBODIES: Lyme IgG/IgM Ab: <0.91 {ISR} (ref 0.00–0.90)

## 2015-08-22 LAB — COPPER, SERUM: COPPER: 123 ug/dL (ref 72–166)

## 2015-08-22 LAB — ANA W/REFLEX: Anti Nuclear Antibody(ANA): NEGATIVE

## 2015-08-22 LAB — RHEUMATOID FACTOR: Rhuematoid fact SerPl-aCnc: <10 IU/mL (ref 0.0–13.9)

## 2015-08-22 LAB — VITAMIN B12: Vitamin B-12: 1945 pg/mL — ABNORMAL HIGH (ref 211–946)

## 2015-08-22 LAB — ANGIOTENSIN CONVERTING ENZYME: Angio Convert Enzyme: 90 U/L — ABNORMAL HIGH (ref 14–82)

## 2015-08-26 ENCOUNTER — Telehealth: Payer: Self-pay

## 2015-08-26 NOTE — Telephone Encounter (Signed)
Spoke to patient. Gave lab results. Patient verbalized understanding.  

## 2015-08-26 NOTE — Telephone Encounter (Signed)
-----   Message from York Spanielharles K Willis, MD sent at 08/23/2015  6:46 PM EDT ----- Blood work is unremarkable, with the exception that the ACE level is slightly elevated, not clinically significant. Please call the patient. ----- Message -----    From: [facility] Lab Results In Interface    Sent: 08/21/2015   7:43 AM      To: York Spanielharles K Willis, MD

## 2015-09-02 DIAGNOSIS — F319 Bipolar disorder, unspecified: Secondary | ICD-10-CM | POA: Diagnosis not present

## 2015-09-05 DIAGNOSIS — Z8639 Personal history of other endocrine, nutritional and metabolic disease: Secondary | ICD-10-CM | POA: Diagnosis not present

## 2015-09-05 DIAGNOSIS — I10 Essential (primary) hypertension: Secondary | ICD-10-CM | POA: Diagnosis not present

## 2015-09-05 DIAGNOSIS — J45909 Unspecified asthma, uncomplicated: Secondary | ICD-10-CM | POA: Diagnosis not present

## 2015-09-05 DIAGNOSIS — Z5181 Encounter for therapeutic drug level monitoring: Secondary | ICD-10-CM | POA: Diagnosis not present

## 2015-09-05 DIAGNOSIS — Z0100 Encounter for examination of eyes and vision without abnormal findings: Secondary | ICD-10-CM | POA: Diagnosis not present

## 2015-09-05 DIAGNOSIS — Z01118 Encounter for examination of ears and hearing with other abnormal findings: Secondary | ICD-10-CM | POA: Diagnosis not present

## 2015-09-05 DIAGNOSIS — Z1389 Encounter for screening for other disorder: Secondary | ICD-10-CM | POA: Diagnosis not present

## 2015-09-05 DIAGNOSIS — Z113 Encounter for screening for infections with a predominantly sexual mode of transmission: Secondary | ICD-10-CM | POA: Diagnosis not present

## 2015-09-05 DIAGNOSIS — R7301 Impaired fasting glucose: Secondary | ICD-10-CM | POA: Diagnosis not present

## 2015-09-05 DIAGNOSIS — Z96653 Presence of artificial knee joint, bilateral: Secondary | ICD-10-CM | POA: Diagnosis not present

## 2015-09-05 DIAGNOSIS — Z131 Encounter for screening for diabetes mellitus: Secondary | ICD-10-CM | POA: Diagnosis not present

## 2015-09-05 DIAGNOSIS — G629 Polyneuropathy, unspecified: Secondary | ICD-10-CM | POA: Diagnosis not present

## 2015-09-05 DIAGNOSIS — Z136 Encounter for screening for cardiovascular disorders: Secondary | ICD-10-CM | POA: Diagnosis not present

## 2015-09-05 DIAGNOSIS — M545 Low back pain: Secondary | ICD-10-CM | POA: Diagnosis not present

## 2015-09-05 DIAGNOSIS — Z0000 Encounter for general adult medical examination without abnormal findings: Secondary | ICD-10-CM | POA: Diagnosis not present

## 2015-09-05 DIAGNOSIS — E785 Hyperlipidemia, unspecified: Secondary | ICD-10-CM | POA: Diagnosis not present

## 2015-09-05 DIAGNOSIS — E559 Vitamin D deficiency, unspecified: Secondary | ICD-10-CM | POA: Diagnosis not present

## 2015-09-16 DIAGNOSIS — F319 Bipolar disorder, unspecified: Secondary | ICD-10-CM | POA: Diagnosis not present

## 2015-09-18 ENCOUNTER — Encounter: Payer: Medicare Other | Admitting: Neurology

## 2015-09-22 DIAGNOSIS — G629 Polyneuropathy, unspecified: Secondary | ICD-10-CM | POA: Diagnosis not present

## 2015-09-22 DIAGNOSIS — E785 Hyperlipidemia, unspecified: Secondary | ICD-10-CM | POA: Diagnosis not present

## 2015-09-22 DIAGNOSIS — I119 Hypertensive heart disease without heart failure: Secondary | ICD-10-CM | POA: Diagnosis not present

## 2015-09-22 DIAGNOSIS — R7301 Impaired fasting glucose: Secondary | ICD-10-CM | POA: Diagnosis not present

## 2015-09-22 DIAGNOSIS — Z96653 Presence of artificial knee joint, bilateral: Secondary | ICD-10-CM | POA: Diagnosis not present

## 2015-09-22 DIAGNOSIS — J45909 Unspecified asthma, uncomplicated: Secondary | ICD-10-CM | POA: Diagnosis not present

## 2015-09-22 DIAGNOSIS — R739 Hyperglycemia, unspecified: Secondary | ICD-10-CM | POA: Diagnosis not present

## 2015-09-22 DIAGNOSIS — I10 Essential (primary) hypertension: Secondary | ICD-10-CM | POA: Diagnosis not present

## 2015-09-23 DIAGNOSIS — G4733 Obstructive sleep apnea (adult) (pediatric): Secondary | ICD-10-CM | POA: Diagnosis not present

## 2015-10-09 ENCOUNTER — Ambulatory Visit (INDEPENDENT_AMBULATORY_CARE_PROVIDER_SITE_OTHER): Payer: Self-pay | Admitting: Neurology

## 2015-10-09 ENCOUNTER — Encounter: Payer: Self-pay | Admitting: Neurology

## 2015-10-09 ENCOUNTER — Ambulatory Visit (INDEPENDENT_AMBULATORY_CARE_PROVIDER_SITE_OTHER): Payer: Medicare Other | Admitting: Neurology

## 2015-10-09 DIAGNOSIS — R202 Paresthesia of skin: Secondary | ICD-10-CM

## 2015-10-09 NOTE — Progress Notes (Signed)
The patient comes in today for an evaluation of the paresthesias and discomfort in both arms, numbness in the feet. Nerve conduction studies done on both arms and the right leg were unremarkable, no evidence of carpal tunnel syndrome is seen. The patient had acute innervation the lower cervical paraspinal muscles, however. The possibility of a cervical spine process such as a central disc needs to be considered.  The patient will be set up for MRI of the cervical spine. I will contact her when the results are available to me.

## 2015-10-09 NOTE — Progress Notes (Signed)
Please refer to EMG and nerve conduction study procedure note. 

## 2015-10-09 NOTE — Procedures (Signed)
     HISTORY:  Arta BruceVerler Deschamps is a [age] year old patient with a history of diabetes who presents with numbness and discomfort in both hands. The patient reports some neck discomfort as well and discomfort down the arms. She has some numbness in the feet as well. She is being evaluated for possible neuropathy or a cervical radiculopathy.  NERVE CONDUCTION STUDIES:  Nerve conduction studies were performed on both upper extremities. The distal motor latencies and motor amplitudes for the median and ulnar nerves were within normal limits. The F wave latencies and nerve conduction velocities for these nerves were also normal. The sensory latencies for the median and ulnar nerves were normal.  Nerve conduction studies were performed on the right lower extremity. The distal motor latencies and motor amplitudes for the peroneal and posterior tibial nerves were within normal limits. The nerve conduction velocities for these nerves were also normal. The H reflex latency was normal. The sensory latency for the peroneal nerve was within normal limits.   EMG STUDIES:  EMG study was performed on the right upper extremity:  The first dorsal interosseous muscle reveals 2 to 4 K units with full recruitment. No fibrillations or positive waves were noted. The abductor pollicis brevis muscle reveals 2 to 4 K units with full recruitment. No fibrillations or positive waves were noted. The extensor indicis proprius muscle reveals 1 to 3 K units with full recruitment. No fibrillations or positive waves were noted. The pronator teres muscle reveals 2 to 3 K units with full recruitment. No fibrillations or positive waves were noted. The biceps muscle reveals 1 to 2 K units with full recruitment. No fibrillations or positive waves were noted. The triceps muscle reveals 2 to 4 K units with full recruitment. No fibrillations or positive waves were noted. The anterior deltoid muscle reveals 2 to 3 K units with full  recruitment. No fibrillations or positive waves were noted. The cervical paraspinal muscles were tested at 2 levels. 2+ positive waves were seen in the lower level, no abnormalities were seen in the upper level.. There was good relaxation.   IMPRESSION:  Nerve conduction studies done on both upper extremities and on the right lower extremity were within normal limits, no evidence of a peripheral neuropathy was seen. EMG evaluation of the right upper extremity was unremarkable, but there was evidence of denervation in the lower cervical paraspinal muscles. Given these findings, and the clinical history of dysesthesias all 4 extremities, the possibility of a cervical radiculopathy of indeterminate level should be considered.  Marlan Palau. Keith Elky Funches MD 10/09/2015 11:12 AM  [facility] [address] [place], [place] [id number]  Phone [phone] Fax [phone]

## 2015-11-03 DIAGNOSIS — I10 Essential (primary) hypertension: Secondary | ICD-10-CM | POA: Diagnosis not present

## 2015-11-03 DIAGNOSIS — Z124 Encounter for screening for malignant neoplasm of cervix: Secondary | ICD-10-CM | POA: Diagnosis not present

## 2015-11-03 DIAGNOSIS — R7301 Impaired fasting glucose: Secondary | ICD-10-CM | POA: Diagnosis not present

## 2015-11-03 DIAGNOSIS — G629 Polyneuropathy, unspecified: Secondary | ICD-10-CM | POA: Diagnosis not present

## 2015-11-03 DIAGNOSIS — J45909 Unspecified asthma, uncomplicated: Secondary | ICD-10-CM | POA: Diagnosis not present

## 2015-11-03 DIAGNOSIS — Z96653 Presence of artificial knee joint, bilateral: Secondary | ICD-10-CM | POA: Diagnosis not present

## 2015-11-03 DIAGNOSIS — Z113 Encounter for screening for infections with a predominantly sexual mode of transmission: Secondary | ICD-10-CM | POA: Diagnosis not present

## 2015-11-03 DIAGNOSIS — I119 Hypertensive heart disease without heart failure: Secondary | ICD-10-CM | POA: Diagnosis not present

## 2015-11-03 DIAGNOSIS — E785 Hyperlipidemia, unspecified: Secondary | ICD-10-CM | POA: Diagnosis not present

## 2015-11-03 DIAGNOSIS — M545 Low back pain: Secondary | ICD-10-CM | POA: Diagnosis not present

## 2015-11-04 ENCOUNTER — Other Ambulatory Visit: Payer: Self-pay | Admitting: Physician Assistant

## 2015-11-04 DIAGNOSIS — Z1231 Encounter for screening mammogram for malignant neoplasm of breast: Secondary | ICD-10-CM

## 2015-11-18 DIAGNOSIS — F319 Bipolar disorder, unspecified: Secondary | ICD-10-CM | POA: Diagnosis not present

## 2015-12-02 DIAGNOSIS — F319 Bipolar disorder, unspecified: Secondary | ICD-10-CM | POA: Diagnosis not present

## 2015-12-15 ENCOUNTER — Telehealth: Payer: Self-pay | Admitting: Neurology

## 2015-12-15 ENCOUNTER — Ambulatory Visit
Admission: RE | Admit: 2015-12-15 | Discharge: 2015-12-15 | Disposition: A | Discharge status: Home / Self Care | Payer: Medicare Other | Source: Ambulatory Visit | Attending: Neurology | Admitting: Neurology

## 2015-12-15 DIAGNOSIS — R202 Paresthesia of skin: Secondary | ICD-10-CM

## 2015-12-15 DIAGNOSIS — M545 Low back pain: Secondary | ICD-10-CM | POA: Diagnosis not present

## 2015-12-15 DIAGNOSIS — M50223 Other cervical disc displacement at C6-C7 level: Secondary | ICD-10-CM | POA: Diagnosis not present

## 2015-12-15 DIAGNOSIS — I119 Hypertensive heart disease without heart failure: Secondary | ICD-10-CM | POA: Diagnosis not present

## 2015-12-15 DIAGNOSIS — I10 Essential (primary) hypertension: Secondary | ICD-10-CM | POA: Diagnosis not present

## 2015-12-15 DIAGNOSIS — M50222 Other cervical disc displacement at C5-C6 level: Secondary | ICD-10-CM | POA: Diagnosis not present

## 2015-12-15 DIAGNOSIS — Z96653 Presence of artificial knee joint, bilateral: Secondary | ICD-10-CM | POA: Diagnosis not present

## 2015-12-15 DIAGNOSIS — R739 Hyperglycemia, unspecified: Secondary | ICD-10-CM | POA: Diagnosis not present

## 2015-12-15 DIAGNOSIS — E785 Hyperlipidemia, unspecified: Secondary | ICD-10-CM | POA: Diagnosis not present

## 2015-12-15 DIAGNOSIS — J45909 Unspecified asthma, uncomplicated: Secondary | ICD-10-CM | POA: Diagnosis not present

## 2015-12-15 DIAGNOSIS — G629 Polyneuropathy, unspecified: Secondary | ICD-10-CM | POA: Diagnosis not present

## 2015-12-15 MED ORDER — DULOXETINE HCL 30 MG PO CPEP: ORAL_CAPSULE | ORAL | Status: DC

## 2015-12-15 NOTE — Telephone Encounter (Signed)
I called the patient. The patient has a relatively unremarkable cervical spine. Nerve conduction studies looked okay. She continues to have stiffness and numbness in the hands. She has a lot of pain, has difficulty using hands. Gabapentin has not been helpful. She is on Mobic and ibuprofen without benefit. I'll try low-dose Cymbalta.   MRI cervical spine results 12/15/15:  IMPRESSION: 1. Mild broad-based disc bulge at C6-7 and C7-T1. No foraminal or central canal stenosis.

## 2015-12-19 DIAGNOSIS — F319 Bipolar disorder, unspecified: Secondary | ICD-10-CM | POA: Diagnosis not present

## 2016-01-12 ENCOUNTER — Other Ambulatory Visit: Payer: Self-pay | Admitting: Physician Assistant

## 2016-01-12 DIAGNOSIS — Z96653 Presence of artificial knee joint, bilateral: Secondary | ICD-10-CM | POA: Diagnosis not present

## 2016-01-12 DIAGNOSIS — R739 Hyperglycemia, unspecified: Secondary | ICD-10-CM | POA: Diagnosis not present

## 2016-01-12 DIAGNOSIS — I10 Essential (primary) hypertension: Secondary | ICD-10-CM | POA: Diagnosis not present

## 2016-01-12 DIAGNOSIS — I119 Hypertensive heart disease without heart failure: Secondary | ICD-10-CM | POA: Diagnosis not present

## 2016-01-12 DIAGNOSIS — J45909 Unspecified asthma, uncomplicated: Secondary | ICD-10-CM | POA: Diagnosis not present

## 2016-01-12 DIAGNOSIS — B372 Candidiasis of skin and nail: Secondary | ICD-10-CM | POA: Diagnosis not present

## 2016-01-12 DIAGNOSIS — N644 Mastodynia: Secondary | ICD-10-CM

## 2016-01-12 DIAGNOSIS — G629 Polyneuropathy, unspecified: Secondary | ICD-10-CM | POA: Diagnosis not present

## 2016-01-12 DIAGNOSIS — E785 Hyperlipidemia, unspecified: Secondary | ICD-10-CM | POA: Diagnosis not present

## 2016-01-19 ENCOUNTER — Other Ambulatory Visit: Payer: Medicare Other

## 2016-01-19 DIAGNOSIS — F319 Bipolar disorder, unspecified: Secondary | ICD-10-CM | POA: Diagnosis not present

## 2016-01-27 ENCOUNTER — Ambulatory Visit
Admission: RE | Admit: 2016-01-27 | Discharge: 2016-01-27 | Disposition: A | Discharge status: Home / Self Care | Payer: Medicare Other | Source: Ambulatory Visit | Attending: Physician Assistant | Admitting: Physician Assistant

## 2016-01-27 DIAGNOSIS — R928 Other abnormal and inconclusive findings on diagnostic imaging of breast: Secondary | ICD-10-CM | POA: Diagnosis not present

## 2016-01-27 DIAGNOSIS — N644 Mastodynia: Secondary | ICD-10-CM

## 2016-01-27 DIAGNOSIS — N6011 Diffuse cystic mastopathy of right breast: Secondary | ICD-10-CM | POA: Diagnosis not present

## 2016-01-27 DIAGNOSIS — N6012 Diffuse cystic mastopathy of left breast: Secondary | ICD-10-CM | POA: Diagnosis not present

## 2016-02-13 DIAGNOSIS — F319 Bipolar disorder, unspecified: Secondary | ICD-10-CM | POA: Diagnosis not present

## 2016-02-26 ENCOUNTER — Emergency Department (HOSPITAL_COMMUNITY)
Admission: EM | Admit: 2016-02-26 | Discharge: 2016-02-27 | Disposition: A | Discharge status: Home / Self Care | Payer: Medicare Other | Attending: Emergency Medicine | Admitting: Emergency Medicine

## 2016-02-26 ENCOUNTER — Encounter (HOSPITAL_COMMUNITY): Payer: Self-pay | Admitting: Emergency Medicine

## 2016-02-26 DIAGNOSIS — J45909 Unspecified asthma, uncomplicated: Secondary | ICD-10-CM | POA: Insufficient documentation

## 2016-02-26 DIAGNOSIS — Z791 Long term (current) use of non-steroidal anti-inflammatories (NSAID): Secondary | ICD-10-CM | POA: Diagnosis not present

## 2016-02-26 DIAGNOSIS — M79642 Pain in left hand: Secondary | ICD-10-CM | POA: Diagnosis not present

## 2016-02-26 DIAGNOSIS — Z9861 Coronary angioplasty status: Secondary | ICD-10-CM | POA: Diagnosis not present

## 2016-02-26 DIAGNOSIS — G44209 Tension-type headache, unspecified, not intractable: Secondary | ICD-10-CM

## 2016-02-26 DIAGNOSIS — Z8719 Personal history of other diseases of the digestive system: Secondary | ICD-10-CM | POA: Diagnosis not present

## 2016-02-26 DIAGNOSIS — E669 Obesity, unspecified: Secondary | ICD-10-CM | POA: Diagnosis not present

## 2016-02-26 DIAGNOSIS — M79641 Pain in right hand: Secondary | ICD-10-CM

## 2016-02-26 DIAGNOSIS — Z79899 Other long term (current) drug therapy: Secondary | ICD-10-CM | POA: Insufficient documentation

## 2016-02-26 DIAGNOSIS — I2510 Atherosclerotic heart disease of native coronary artery without angina pectoris: Secondary | ICD-10-CM | POA: Diagnosis not present

## 2016-02-26 DIAGNOSIS — I10 Essential (primary) hypertension: Secondary | ICD-10-CM | POA: Insufficient documentation

## 2016-02-26 DIAGNOSIS — H538 Other visual disturbances: Secondary | ICD-10-CM | POA: Diagnosis not present

## 2016-02-26 DIAGNOSIS — R51 Headache: Secondary | ICD-10-CM | POA: Diagnosis not present

## 2016-02-26 DIAGNOSIS — Z880 Allergy status to penicillin: Secondary | ICD-10-CM | POA: Insufficient documentation

## 2016-02-26 NOTE — ED Provider Notes (Signed)
CSN: [id number]     Arrival date & time 02/26/16  2234 History  By signing my name below, I, Bethel Born, attest that this documentation has been prepared under the direction and in the presence of Dione Booze, MD. Electronically Signed: Bethel Born, ED Scribe. 02/27/2016. 12:33 AM    Chief Complaint  Patient presents with  . Hand Pain  . Headache   The history is provided by the patient. No language interpreter was used.   Kandy Deeg is a 50 y.o. female with PMHx of HTN and CAD who presents to the Emergency Department complaining of an intermittent, sharp/shooting, left and posterior headache with onset yesterday. The episodes of pain last for 45 minutes and occur without known trigger. The pain has no modifying factors. Associated symptoms include blurred vision. Pt denies nausea and vomiting. Pt also complains of cramping/sharp pain and swelling in the bilateral hands (L>R) with onset at least 2 months ago. She states that her hands swell and she wakes up with pain.   Past Medical History  Diagnosis Date  . Hypertension   . Transfusion history     48yrs ago  . GERD (gastroesophageal reflux disease)   . CAD (coronary artery disease)   . Asthma   . Obesity   . Heart disease   . Paresthesia 08/20/2015  . Prediabetes    Past Surgical History  Procedure Laterality Date  . Total knee arthroplasty Bilateral   . Joint replacement Bilateral   . Breast biopsy with sentinel lymph node biopsy and needle localization Left   . Colonoscopy with propofol N/A 02/19/2014    Procedure: COLONOSCOPY WITH PROPOFOL;  Surgeon: Charolett Bumpers, MD;  Location: [facility];  Service: Endoscopy;  Laterality: N/A;  . Angioplasty     Family History  Problem Relation Age of Onset  . Hypertension Mother   . Diabetes Mother   . Stroke Father   . Depression Brother    Social History  Substance Use Topics  . Smoking status: Never Smoker   . Smokeless tobacco: None  . Alcohol Use: No   OB  History    No data available     Review of Systems  Eyes: Positive for visual disturbance.  Gastrointestinal: Negative for nausea and vomiting.  Musculoskeletal: Positive for joint swelling and arthralgias.  Neurological: Positive for headaches. Negative for weakness and numbness.  All other systems reviewed and are negative.  Allergies  Morphine and related; Penicillins; and Tramadol  Home Medications   Prior to Admission medications   Medication Sig Start Date End Date Taking? Authorizing Provider  atorvastatin (LIPITOR) 10 MG tablet Take 10 mg by mouth every morning.    Historical Provider, MD  clonazePAM (KLONOPIN) 0.5 MG tablet Take 0.5 mg by mouth 3 (three) times daily as needed for anxiety.    Historical Provider, MD  [facility] Liver Oil CAPS Take 1 capsule by mouth daily.    Historical Provider, MD  CRANBERRY PO Take 1 tablet by mouth daily.    Historical Provider, MD  diazepam (VALIUM) 5 MG tablet Take 1 tablet (5 mg total) by mouth every 6 (six) hours as needed for anxiety or muscle spasms. 11/20/14   Trixie Dredge, PA-C  DULoxetine (CYMBALTA) 30 MG capsule 1 tablet daily for 2 weeks, then take 1 twice daily 12/15/15   York Spaniel, MD  gabapentin (NEURONTIN) 300 MG capsule Take 300 mg by mouth 3 (three) times daily.    Historical Provider, MD  hydrochlorothiazide (HYDRODIURIL) 25 MG  tablet Take 25 mg by mouth daily.    Historical Provider, MD  hydrochlorothiazide (MICROZIDE) 12.5 MG capsule Take 12.5 mg by mouth every morning.    Historical Provider, MD  HYDROcodone-acetaminophen (NORCO) 5-325 MG per tablet Take 1-2 tablets by mouth every 6 (six) hours as needed for severe pain. 05/06/14   Doug Sou, MD  ibuprofen (ADVIL,MOTRIN) 800 MG tablet Take 1 tablet (800 mg total) by mouth every 8 (eight) hours as needed for mild pain or moderate pain. Patient not taking: Reported on 08/20/2015 11/20/14   Trixie Dredge, PA-C  lamoTRIgine (LAMICTAL) 100 MG tablet Take 100 mg by mouth daily.     Historical Provider, MD  meloxicam (MOBIC) 15 MG tablet Take 15 mg by mouth daily.    Historical Provider, MD  mirtazapine (REMERON) 15 MG tablet Take 15 mg by mouth at bedtime.    Historical Provider, MD  phentermine 37.5 MG capsule Take 37.5 mg by mouth every morning.    Historical Provider, MD  QUEtiapine (SEROQUEL) 50 MG tablet Take 50 mg by mouth at bedtime.    Historical Provider, MD  tiZANidine (ZANAFLEX) 4 MG tablet Take 4 mg by mouth every 8 (eight) hours as needed for muscle spasms.    Historical Provider, MD  verapamil (VERELAN PM) 360 MG 24 hr capsule Take 360 mg by mouth every morning.    Historical Provider, MD  vitamin B-12 (CYANOCOBALAMIN) 1000 MCG tablet Take 1,000 mcg by mouth daily.    Historical Provider, MD  vitamin E 100 UNIT capsule Take 100 Units by mouth daily.    Historical Provider, MD   BP 151/86 mmHg  Pulse 80  Temp(Src) 98 F (36.7 C) (Oral)  Resp 20  SpO2 99% Physical Exam  Constitutional: She is oriented to person, place, and time. She appears well-developed and well-nourished. No distress.  HENT:  Head: Normocephalic and atraumatic.  Tender over the left temporalis muscle and insertion of the left paracervical muscles   Eyes: EOM are normal. Pupils are equal, round, and reactive to light.  Neck: Normal range of motion. Neck supple. No JVD present.  Cardiovascular: Normal rate, regular rhythm and normal heart sounds.   No murmur heard. Pulmonary/Chest: Effort normal and breath sounds normal. She has no wheezes. She has no rales. She exhibits no tenderness.  Abdominal: Soft. Bowel sounds are normal. She exhibits no distension and no mass. There is no tenderness.  Musculoskeletal: Normal range of motion. She exhibits no edema.  Moderate weakness of left pincer grasp   Lymphadenopathy:    She has no cervical adenopathy.  Neurological: She is alert and oriented to person, place, and time. No cranial nerve deficit. She exhibits normal muscle tone.  Coordination normal.  Skin: Skin is warm and dry. No rash noted.  Psychiatric: She has a normal mood and affect. Her behavior is normal. Judgment and thought content normal.  Nursing note and vitals reviewed.   ED Course  Procedures (including critical care time) DIAGNOSTIC STUDIES: Oxygen Saturation is 99% on RA, normal by my interpretation.    COORDINATION OF CARE: 12:28 AM Discussed treatment plan which includes lab work, Reglan, Benadryl, and IVF with pt at bedside and pt agreed to plan.  Labs Review Results for orders placed or performed during the hospital encounter of 02/26/16  Comprehensive metabolic panel  Result Value Ref Range   Sodium 144 135 - 145 mmol/L   Potassium 3.9 3.5 - 5.1 mmol/L   Chloride 106 101 - 111 mmol/L   CO2  27 22 - 32 mmol/L   Glucose, Bld 101 (H) 65 - 99 mg/dL   BUN 15 6 - 20 mg/dL   Creatinine, Ser 4.090.87 0.44 - 1.00 mg/dL   Calcium 81.110.3 8.9 - 91.410.3 mg/dL   Total Protein 7.1 6.5 - 8.1 g/dL   Albumin 4.0 3.5 - 5.0 g/dL   AST 16 15 - 41 U/L   ALT 13 (L) 14 - 54 U/L   Alkaline Phosphatase 104 38 - 126 U/L   Total Bilirubin 0.1 (L) 0.3 - 1.2 mg/dL   GFR calc non Af Amer >60 >60 mL/min   GFR calc Af Amer >60 >60 mL/min   Anion gap 11 5 - 15  CBC with Differential  Result Value Ref Range   WBC 8.1 4.0 - 10.5 K/uL   RBC 4.83 3.87 - 5.11 MIL/uL   Hemoglobin 12.8 12.0 - 15.0 g/dL   HCT 78.240.8 95.636.0 - 21.346.0 %   MCV 84.5 78.0 - 100.0 fL   MCH 26.5 26.0 - 34.0 pg   MCHC 31.4 30.0 - 36.0 g/dL   RDW 08.613.1 57.811.5 - 46.915.5 %   Platelets 246 150 - 400 K/uL   Neutrophils Relative % 39 %   Neutro Abs 3.2 1.7 - 7.7 K/uL   Lymphocytes Relative 52 %   Lymphs Abs 4.3 (H) 0.7 - 4.0 K/uL   Monocytes Relative 7 %   Monocytes Absolute 0.5 0.1 - 1.0 K/uL   Eosinophils Relative 2 %   Eosinophils Absolute 0.2 0.0 - 0.7 K/uL   Basophils Relative 0 %   Basophils Absolute 0.0 0.0 - 0.1 K/uL  CK  Result Value Ref Range   Total CK 54 38 - 234 U/L  Sedimentation rate   Result Value Ref Range   Sed Rate 19 0 - 22 mm/hr   I have personally reviewed and evaluated these lab results as part of my medical decision-making.   MDM   Final diagnoses:  Muscle contraction headache  Bilateral hand pain    Chronic hand pain of uncertain cause. No physical findings present to suggest serious pathology. Chronicity of symptoms is also reassuring. This will need to be evaluated as an outpatient. Old records were reviewed and she had been evaluated by a neurologist and had normal nerve conduction studies in December. Headache which is rather atypical based on symptoms, but physical findings are strongly suggestive of muscle contraction headache. No historical or physical exam findings to suggest subarachnoid hemorrhage or other serious pathology. Based on age, will check sedimentation rate to rule out temporal arteritis. She'll be given a trial of a migraine cocktail.  Screening labs are normal including sedimentation rate. CK had been checked because she is on a statin and is normal. She had excellent relief of her headache with IV fluids, metoclopramide, diphenhydramine. She is discharged with prescription for metoclopramide. I have suggested that she may benefit from evaluation by rheumatologist relating to her chronic hand symptoms.  I personally performed the services described in this documentation, which was scribed in my presence. The recorded information has been reviewed and is accurate.      Dione Boozeavid Braelynn Benning, MD 02/27/16 [phone]

## 2016-02-26 NOTE — ED Notes (Signed)
Pt states for the past couple of days she has been having swelling in her palm area of both hands and having pain in her fingers mainly on the left hand  Pt states today she has had intermittent sharp pains that come and go in the left side of her head  Pt states these pains are very sharp and they come and go quickly  Pt states she [facility] and [facility] and was told if the pains continued to come to the hospital but they had a 6 hour wait so told her to come here instead

## 2016-02-27 DIAGNOSIS — R51 Headache: Secondary | ICD-10-CM | POA: Diagnosis not present

## 2016-02-27 LAB — CBC WITH DIFFERENTIAL/PLATELET
BASOS PCT: 0 %
Basophils Absolute: 0 10*3/uL (ref 0.0–0.1)
Eosinophils Absolute: 0.2 10*3/uL (ref 0.0–0.7)
Eosinophils Relative: 2 %
HEMATOCRIT: 40.8 % (ref 36.0–46.0)
Hemoglobin: 12.8 g/dL (ref 12.0–15.0)
LYMPHS ABS: 4.3 10*3/uL — AB (ref 0.7–4.0)
Lymphocytes Relative: 52 %
MCH: 26.5 pg (ref 26.0–34.0)
MCHC: 31.4 g/dL (ref 30.0–36.0)
MCV: 84.5 fL (ref 78.0–100.0)
MONO ABS: 0.5 10*3/uL (ref 0.1–1.0)
MONOS PCT: 7 %
NEUTROS ABS: 3.2 10*3/uL (ref 1.7–7.7)
Neutrophils Relative %: 39 %
Platelets: 246 10*3/uL (ref 150–400)
RBC: 4.83 MIL/uL (ref 3.87–5.11)
RDW: 13.1 % (ref 11.5–15.5)
WBC: 8.1 10*3/uL (ref 4.0–10.5)

## 2016-02-27 LAB — COMPREHENSIVE METABOLIC PANEL
ALBUMIN: 4 g/dL (ref 3.5–5.0)
ALK PHOS: 104 U/L (ref 38–126)
ALT: 13 U/L — AB (ref 14–54)
AST: 16 U/L (ref 15–41)
Anion gap: 11 (ref 5–15)
BUN: 15 mg/dL (ref 6–20)
CALCIUM: 10.3 mg/dL (ref 8.9–10.3)
CHLORIDE: 106 mmol/L (ref 101–111)
CO2: 27 mmol/L (ref 22–32)
CREATININE: 0.87 mg/dL (ref 0.44–1.00)
GFR calc Af Amer: >60 mL/min (ref >60)
GFR calc non Af Amer: >60 mL/min (ref >60)
GLUCOSE: 101 mg/dL — AB (ref 65–99)
Potassium: 3.9 mmol/L (ref 3.5–5.1)
SODIUM: 144 mmol/L (ref 135–145)
Total Bilirubin: 0.1 mg/dL — ABNORMAL LOW (ref 0.3–1.2)
Total Protein: 7.1 g/dL (ref 6.5–8.1)

## 2016-02-27 LAB — SEDIMENTATION RATE: SED RATE: 19 mm/h (ref 0–22)

## 2016-02-27 LAB — CK: Total CK: 54 U/L (ref 38–234)

## 2016-02-27 MED ORDER — METOCLOPRAMIDE HCL 10 MG PO TABS: 10.0000 mg | ORAL_TABLET | Freq: Four times a day (QID) | ORAL | Status: DC | PRN

## 2016-02-27 MED ORDER — DIPHENHYDRAMINE HCL 50 MG/ML IJ SOLN
25.0000 mg | Freq: Once | INTRAMUSCULAR | Status: AC
  Administered 2016-02-27: 25 mg via INTRAVENOUS
  Filled 2016-02-27: qty 1

## 2016-02-27 MED ORDER — SODIUM CHLORIDE 0.9 % IV SOLN
1000.0000 mL | Freq: Once | INTRAVENOUS | Status: AC
  Administered 2016-02-27: 1000 mL via INTRAVENOUS

## 2016-02-27 MED ORDER — SODIUM CHLORIDE 0.9 % IV SOLN: 1000.0000 mL | INTRAVENOUS | Status: DC

## 2016-02-27 MED ORDER — METOCLOPRAMIDE HCL 5 MG/ML IJ SOLN
10.0000 mg | Freq: Once | INTRAMUSCULAR | Status: AC
  Administered 2016-02-27: 10 mg via INTRAVENOUS
  Filled 2016-02-27: qty 2

## 2016-02-27 NOTE — Discharge Instructions (Signed)
Talk with your doctor about possible referral to a rheumatologist to evaluate your hand pain.  Tension Headache A tension headache is a feeling of pain, pressure, or aching that is often felt over the front and sides of the head. The pain can be dull, or it can feel tight (constricting). Tension headaches are not normally associated with nausea or vomiting, and they do not get worse with physical activity. Tension headaches can last from 30 minutes to several days. This is the most common type of headache. CAUSES The exact cause of this condition is not known. Tension headaches often begin after stress, anxiety, or depression. Other triggers may include:  Alcohol.  Too much caffeine, or caffeine withdrawal.  Respiratory infections, such as colds, flu, or sinus infections.  Dental problems or teeth clenching.  Fatigue.  Holding your head and neck in the same position for a long period of time, such as while using a computer.  Smoking. SYMPTOMS Symptoms of this condition include:  A feeling of pressure around the head.  Dull, aching head pain.  Pain felt over the front and sides of the head.  Tenderness in the muscles of the head, neck, and shoulders. DIAGNOSIS This condition may be diagnosed based on your symptoms and a physical exam. Tests may be done, such as a CT scan or an MRI of your head. These tests may be done if your symptoms are severe or unusual. TREATMENT This condition may be treated with lifestyle changes and medicines to help relieve symptoms. HOME CARE INSTRUCTIONS Managing Pain  Take over-the-counter and prescription medicines only as told by your health care provider.  Lie down in a dark, quiet room when you have a headache.  If directed, apply ice to the head and neck area:  Put ice in a plastic bag.  Place a towel between your skin and the bag.  Leave the ice on for 20 minutes, 2-3 times per day.  Use a heating pad or a hot shower to apply heat to  the head and neck area as told by your health care provider. Eating and Drinking  Eat meals on a regular schedule.  Limit alcohol use.  Decrease your caffeine intake, or stop using caffeine. General Instructions  Keep all follow-up visits as told by your health care provider. This is important.  Keep a headache journal to help find out what may trigger your headaches. For example, write down:  What you eat and drink.  How much sleep you get.  Any change to your diet or medicines.  Try massage or other relaxation techniques.  Limit stress.  Sit up straight, and avoid tensing your muscles.  Do not use tobacco products, including cigarettes, chewing tobacco, or e-cigarettes. If you need help quitting, ask your health care provider.  Exercise regularly as told by your health care provider.  Get 7-9 hours of sleep, or the amount recommended by your health care provider. SEEK MEDICAL CARE IF:  Your symptoms are not helped by medicine.  You have a headache that is different from what you normally experience.  You have nausea or you vomit.  You have a fever. SEEK IMMEDIATE MEDICAL CARE IF:  Your headache becomes severe.  You have repeated vomiting.  You have a stiff neck.  You have a loss of vision.  You have problems with speech.  You have pain in your eye or ear.  You have muscular weakness or loss of muscle control.  You lose your balance or you have  trouble walking.  You feel faint or you pass out.  You have confusion.   This information is not intended to replace advice given to you by your health care provider. Make sure you discuss any questions you have with your health care provider.   Document Released: 10/04/2005 Document Revised: 06/25/2015 Document Reviewed: 01/27/2015 Elsevier Interactive Patient Education 2016 [employer]  Metoclopramide tablets What is this medicine? METOCLOPRAMIDE (met oh kloe PRA mide) is used to treat the symptoms of  gastroesophageal reflux disease (GERD) like heartburn. It is also used to treat people with slow emptying of the stomach and intestinal tract. This medicine may be used for other purposes; ask your health care provider or pharmacist if you have questions. What should I tell my health care provider before I take this medicine? They need to know if you have any of these conditions: -breast cancer -depression -diabetes -heart failure -high blood pressure -kidney disease -liver disease -Parkinson's disease or a movement disorder -pheochromocytoma -seizures -stomach obstruction, bleeding, or perforation -an unusual or allergic reaction to metoclopramide, procainamide, sulfites, other medicines, foods, dyes, or preservatives -pregnant or trying to get pregnant -breast-feeding How should I use this medicine? Take this medicine by mouth with a glass of water. Follow the directions on the prescription label. Take this medicine on an empty stomach, about 30 minutes before eating. Take your doses at regular intervals. Do not take your medicine more often than directed. Do not stop taking except on the advice of your doctor or health care professional. A special MedGuide will be given to you by the pharmacist with each prescription and refill. Be sure to read this information carefully each time. Talk to your pediatrician regarding the use of this medicine in children. Special care may be needed. Overdosage: If you think you have taken too much of this medicine contact a poison control center or emergency room at once. NOTE: This medicine is only for you. Do not share this medicine with others. What if I miss a dose? If you miss a dose, take it as soon as you can. If it is almost time for your next dose, take only that dose. Do not take double or extra doses. What may interact with this medicine? -acetaminophen -cyclosporine -digoxin -medicines for blood pressure -medicines for diabetes, including  insulin -medicines for hay fever and other allergies -medicines for depression, especially an Monoamine Oxidase Inhibitor (MAOI) -medicines for Parkinson's disease, like levodopa -medicines for sleep or for pain -tetracycline This list may not describe all possible interactions. Give your health care provider a list of all the medicines, herbs, non-prescription drugs, or dietary supplements you use. Also tell them if you smoke, drink alcohol, or use illegal drugs. Some items may interact with your medicine. What should I watch for while using this medicine? It may take a few weeks for your stomach condition to start to get better. However, do not take this medicine for longer than 12 weeks. The longer you take this medicine, and the more you take it, the greater your chances are of developing serious side effects. If you are an elderly patient, a female patient, or you have diabetes, you may be at an increased risk for side effects from this medicine. Contact your doctor immediately if you start having movements you cannot control such as lip smacking, rapid movements of the tongue, involuntary or uncontrollable movements of the eyes, head, arms and legs, or muscle twitches and spasms. Patients and their families should watch out for  worsening depression or thoughts of suicide. Also watch out for any sudden or severe changes in feelings such as feeling anxious, agitated, panicky, irritable, hostile, aggressive, impulsive, severely restless, overly excited and hyperactive, or not being able to sleep. If this happens, especially at the beginning of treatment or after a change in dose, call your doctor. Do not treat yourself for high fever. Ask your doctor or health care professional for advice. You may get drowsy or dizzy. Do not drive, use machinery, or do anything that needs mental alertness until you know how this drug affects you. Do not stand or sit up quickly, especially if you are an older patient.  This reduces the risk of dizzy or fainting spells. Alcohol can make you more drowsy and dizzy. Avoid alcoholic drinks. What side effects may I notice from receiving this medicine? Side effects that you should report to your doctor or health care professional as soon as possible: -allergic reactions like skin rash, itching or hives, swelling of the face, lips, or tongue -abnormal production of milk in females -breast enlargement in both males and females -change in the way you walk -difficulty moving, speaking or swallowing -drooling, lip smacking, or rapid movements of the tongue -excessive sweating -fever -involuntary or uncontrollable movements of the eyes, head, arms and legs -irregular heartbeat or palpitations -muscle twitches and spasms -unusually weak or tired Side effects that usually do not require medical attention (report to your doctor or health care professional if they continue or are bothersome): -change in sex drive or performance -depressed mood -diarrhea -difficulty sleeping -headache -menstrual changes -restless or nervous This list may not describe all possible side effects. Call your doctor for medical advice about side effects. You may report side effects to FDA at 1-800-FDA-1088. Where should I keep my medicine? Keep out of the reach of children. Store at room temperature between 20 and 25 degrees C (68 and 77 degrees F). Protect from light. Keep container tightly closed. Throw away any unused medicine after the expiration date. NOTE: This sheet is a summary. It may not cover all possible information. If you have questions about this medicine, talk to your doctor, pharmacist, or health care provider.    2016, Elsevier/Gold Standard. (2012-02-01 13:04:38)

## 2016-03-03 DIAGNOSIS — I119 Hypertensive heart disease without heart failure: Secondary | ICD-10-CM | POA: Diagnosis not present

## 2016-03-03 DIAGNOSIS — E785 Hyperlipidemia, unspecified: Secondary | ICD-10-CM | POA: Diagnosis not present

## 2016-03-03 DIAGNOSIS — I10 Essential (primary) hypertension: Secondary | ICD-10-CM | POA: Diagnosis not present

## 2016-03-03 DIAGNOSIS — Z96653 Presence of artificial knee joint, bilateral: Secondary | ICD-10-CM | POA: Diagnosis not present

## 2016-03-03 DIAGNOSIS — M545 Low back pain: Secondary | ICD-10-CM | POA: Diagnosis not present

## 2016-03-03 DIAGNOSIS — J45909 Unspecified asthma, uncomplicated: Secondary | ICD-10-CM | POA: Diagnosis not present

## 2016-03-03 DIAGNOSIS — G629 Polyneuropathy, unspecified: Secondary | ICD-10-CM | POA: Diagnosis not present

## 2016-03-30 DIAGNOSIS — M545 Low back pain: Secondary | ICD-10-CM | POA: Diagnosis not present

## 2016-03-30 DIAGNOSIS — J45909 Unspecified asthma, uncomplicated: Secondary | ICD-10-CM | POA: Diagnosis not present

## 2016-03-30 DIAGNOSIS — E785 Hyperlipidemia, unspecified: Secondary | ICD-10-CM | POA: Diagnosis not present

## 2016-03-30 DIAGNOSIS — I119 Hypertensive heart disease without heart failure: Secondary | ICD-10-CM | POA: Diagnosis not present

## 2016-03-30 DIAGNOSIS — M5481 Occipital neuralgia: Secondary | ICD-10-CM | POA: Diagnosis not present

## 2016-03-30 DIAGNOSIS — I10 Essential (primary) hypertension: Secondary | ICD-10-CM | POA: Diagnosis not present

## 2016-03-30 DIAGNOSIS — Z131 Encounter for screening for diabetes mellitus: Secondary | ICD-10-CM | POA: Diagnosis not present

## 2016-03-30 DIAGNOSIS — Z96653 Presence of artificial knee joint, bilateral: Secondary | ICD-10-CM | POA: Diagnosis not present

## 2016-05-06 DIAGNOSIS — F319 Bipolar disorder, unspecified: Secondary | ICD-10-CM | POA: Diagnosis not present

## 2016-05-07 DIAGNOSIS — F319 Bipolar disorder, unspecified: Secondary | ICD-10-CM | POA: Diagnosis not present

## 2016-05-13 DIAGNOSIS — Z131 Encounter for screening for diabetes mellitus: Secondary | ICD-10-CM | POA: Diagnosis not present

## 2016-05-13 DIAGNOSIS — R7303 Prediabetes: Secondary | ICD-10-CM | POA: Diagnosis not present

## 2016-05-13 DIAGNOSIS — M5481 Occipital neuralgia: Secondary | ICD-10-CM | POA: Diagnosis not present

## 2016-05-13 DIAGNOSIS — I119 Hypertensive heart disease without heart failure: Secondary | ICD-10-CM | POA: Diagnosis not present

## 2016-05-13 DIAGNOSIS — M545 Low back pain: Secondary | ICD-10-CM | POA: Diagnosis not present

## 2016-05-13 DIAGNOSIS — J45909 Unspecified asthma, uncomplicated: Secondary | ICD-10-CM | POA: Diagnosis not present

## 2016-05-13 DIAGNOSIS — E785 Hyperlipidemia, unspecified: Secondary | ICD-10-CM | POA: Diagnosis not present

## 2016-05-13 DIAGNOSIS — I10 Essential (primary) hypertension: Secondary | ICD-10-CM | POA: Diagnosis not present

## 2016-06-01 DIAGNOSIS — Z96653 Presence of artificial knee joint, bilateral: Secondary | ICD-10-CM | POA: Diagnosis not present

## 2016-06-01 DIAGNOSIS — I10 Essential (primary) hypertension: Secondary | ICD-10-CM | POA: Diagnosis not present

## 2016-06-01 DIAGNOSIS — I119 Hypertensive heart disease without heart failure: Secondary | ICD-10-CM | POA: Diagnosis not present

## 2016-06-01 DIAGNOSIS — J45909 Unspecified asthma, uncomplicated: Secondary | ICD-10-CM | POA: Diagnosis not present

## 2016-06-01 DIAGNOSIS — E785 Hyperlipidemia, unspecified: Secondary | ICD-10-CM | POA: Diagnosis not present

## 2016-06-01 DIAGNOSIS — R7303 Prediabetes: Secondary | ICD-10-CM | POA: Diagnosis not present

## 2016-06-25 DIAGNOSIS — F319 Bipolar disorder, unspecified: Secondary | ICD-10-CM | POA: Diagnosis not present

## 2016-09-06 DIAGNOSIS — Z131 Encounter for screening for diabetes mellitus: Secondary | ICD-10-CM | POA: Diagnosis not present

## 2016-09-06 DIAGNOSIS — R7303 Prediabetes: Secondary | ICD-10-CM | POA: Diagnosis not present

## 2016-09-06 DIAGNOSIS — E785 Hyperlipidemia, unspecified: Secondary | ICD-10-CM | POA: Diagnosis not present

## 2016-09-06 DIAGNOSIS — Z96653 Presence of artificial knee joint, bilateral: Secondary | ICD-10-CM | POA: Diagnosis not present

## 2016-09-06 DIAGNOSIS — Z0000 Encounter for general adult medical examination without abnormal findings: Secondary | ICD-10-CM | POA: Diagnosis not present

## 2016-09-06 DIAGNOSIS — I10 Essential (primary) hypertension: Secondary | ICD-10-CM | POA: Diagnosis not present

## 2016-09-06 DIAGNOSIS — J45909 Unspecified asthma, uncomplicated: Secondary | ICD-10-CM | POA: Diagnosis not present

## 2016-09-06 DIAGNOSIS — I119 Hypertensive heart disease without heart failure: Secondary | ICD-10-CM | POA: Diagnosis not present

## 2016-10-06 ENCOUNTER — Emergency Department (HOSPITAL_COMMUNITY)
Admission: EM | Admit: 2016-10-06 | Discharge: 2016-10-06 | Disposition: A | Discharge status: Home / Self Care | Payer: Medicare Other | Attending: Emergency Medicine | Admitting: Emergency Medicine

## 2016-10-06 ENCOUNTER — Encounter (HOSPITAL_COMMUNITY): Payer: Self-pay | Admitting: Emergency Medicine

## 2016-10-06 ENCOUNTER — Emergency Department (HOSPITAL_COMMUNITY): Payer: Medicare Other

## 2016-10-06 DIAGNOSIS — I2510 Atherosclerotic heart disease of native coronary artery without angina pectoris: Secondary | ICD-10-CM | POA: Diagnosis not present

## 2016-10-06 DIAGNOSIS — I10 Essential (primary) hypertension: Secondary | ICD-10-CM | POA: Insufficient documentation

## 2016-10-06 DIAGNOSIS — Y929 Unspecified place or not applicable: Secondary | ICD-10-CM | POA: Diagnosis not present

## 2016-10-06 DIAGNOSIS — S92515A Nondisplaced fracture of proximal phalanx of left lesser toe(s), initial encounter for closed fracture: Secondary | ICD-10-CM

## 2016-10-06 DIAGNOSIS — Y999 Unspecified external cause status: Secondary | ICD-10-CM | POA: Diagnosis not present

## 2016-10-06 DIAGNOSIS — W2209XA Striking against other stationary object, initial encounter: Secondary | ICD-10-CM | POA: Diagnosis not present

## 2016-10-06 DIAGNOSIS — Y939 Activity, unspecified: Secondary | ICD-10-CM | POA: Diagnosis not present

## 2016-10-06 DIAGNOSIS — J45909 Unspecified asthma, uncomplicated: Secondary | ICD-10-CM | POA: Diagnosis not present

## 2016-10-06 DIAGNOSIS — S92512A Displaced fracture of proximal phalanx of left lesser toe(s), initial encounter for closed fracture: Secondary | ICD-10-CM | POA: Diagnosis not present

## 2016-10-06 DIAGNOSIS — Z96653 Presence of artificial knee joint, bilateral: Secondary | ICD-10-CM | POA: Insufficient documentation

## 2016-10-06 DIAGNOSIS — S99922A Unspecified injury of left foot, initial encounter: Secondary | ICD-10-CM | POA: Diagnosis present

## 2016-10-06 DIAGNOSIS — M79675 Pain in left toe(s): Secondary | ICD-10-CM

## 2016-10-06 MED ORDER — NAPROXEN 500 MG PO TABS: 500.0000 mg | ORAL_TABLET | Freq: Two times a day (BID) | ORAL | 0 refills | Status: DC

## 2016-10-06 MED ORDER — ACETAMINOPHEN 500 MG PO TABS
1000.0000 mg | ORAL_TABLET | Freq: Once | ORAL | Status: AC
  Administered 2016-10-06: 1000 mg via ORAL
  Filled 2016-10-06: qty 2

## 2016-10-06 NOTE — ED Triage Notes (Signed)
Patient reports left 3rd and 4th toe pain/swelling/bruising after hitting her toes on the door last night.

## 2016-10-06 NOTE — Discharge Instructions (Signed)
Unfortunately your x-rays showed a proximal oblique fracture of your 4th toe.  This fracture is considered stable.  You should keep the buddy taping and wear the boot until your toe is no longer tender. This usually takes 4-6 weeks.  Please call your primary care provider and schedule an appointment in the next 1-2 weeks to make sure your toe is healing well.    Please read attached information on "toe fractures" and "toe fracture rehab".  Ice, rest and elevation will help with the pain and swelling in the first 3-5 days.  Keep ice on for maximum 15 mins.    Take tylenol or ibuprofen three times a day for the first 3 days. After the first three days you may cut back and do tylenol or ibuprofen once a day or as needed.    Return to emergency department as needed or if your symptoms worsen.

## 2016-10-07 NOTE — ED Provider Notes (Signed)
[facility]-EMERGENCY DEPT Provider Note   CSN: [id number] Arrival date & time: 10/06/16  1811     History   Chief Complaint Chief Complaint  Patient presents with  . Toe Pain    Left    HPI Jo Allen is a 50 y.o. female with pmh as noted below who presents with acute pain of the L 4th toe.  Pt states she stubbed her toe on a door yesterday.  Pt's son tried to massage it and crack it but this only made the pain worse.  Pt woke up this morning and toe was swollen and red.    HPI  Past Medical History:  Diagnosis Date  . Asthma   . CAD (coronary artery disease)   . GERD (gastroesophageal reflux disease)   . Heart disease   . Hypertension   . Obesity   . Paresthesia 08/20/2015  . Prediabetes   . Transfusion history    4758yrs ago    Patient Active Problem List   Diagnosis Date Noted  . Paresthesia 08/20/2015    Past Surgical History:  Procedure Laterality Date  . ANGIOPLASTY    . BREAST BIOPSY WITH SENTINEL LYMPH NODE BIOPSY AND NEEDLE LOCALIZATION Left   . COLONOSCOPY WITH PROPOFOL N/A 02/19/2014   Procedure: COLONOSCOPY WITH PROPOFOL;  Surgeon: Charolett BumpersMartin K Johnson, MD;  Location: [facility] ENDOSCOPY;  Service: Endoscopy;  Laterality: N/A;  . JOINT REPLACEMENT Bilateral   . TOTAL KNEE ARTHROPLASTY Bilateral     OB History    No data available       Home Medications    Prior to Admission medications   Medication Sig Start Date End Date Taking? Authorizing Provider  atorvastatin (LIPITOR) 10 MG tablet Take 10 mg by mouth every morning.    Historical Provider, MD  buPROPion (WELLBUTRIN XL) 300 MG 24 hr tablet Take 300 mg by mouth daily. 02/13/16   Historical Provider, MD  CRANBERRY PO Take 1 tablet by mouth daily.    Historical Provider, MD  diazepam (VALIUM) 5 MG tablet Take 1 tablet (5 mg total) by mouth every 6 (six) hours as needed for anxiety or muscle spasms. 11/20/14   Trixie DredgeEmily West, PA-C  DULoxetine (CYMBALTA) 30 MG capsule 1 tablet daily for 2 weeks, then take 1 twice  daily Patient taking differently: Take 30 mgs twice daily 12/15/15   York Spanielharles K Willis, MD  gabapentin (NEURONTIN) 300 MG capsule Take 300 mg by mouth 3 (three) times daily.    Historical Provider, MD  hydrochlorothiazide (HYDRODIURIL) 25 MG tablet Take 25 mg by mouth daily.    Historical Provider, MD  lamoTRIgine (LAMICTAL) 100 MG tablet Take 100 mg by mouth daily.    Historical Provider, MD  LYRICA 50 MG capsule Take 50 mg by mouth 2 (two) times daily. 01/12/16   Historical Provider, MD  meloxicam (MOBIC) 15 MG tablet Take 15 mg by mouth daily.    Historical Provider, MD  metoCLOPramide (REGLAN) 10 MG tablet Take 1 tablet (10 mg total) by mouth every 6 (six) hours as needed for nausea (or headache). 02/27/16   Dione Boozeavid Glick, MD  mirtazapine (REMERON) 15 MG tablet Take 15 mg by mouth daily as needed (sleep).     Historical Provider, MD  naproxen (NAPROSYN) 500 MG tablet Take 1 tablet (500 mg total) by mouth 2 (two) times daily. 10/06/16   Liberty Handylaudia J Tatiyanna Lashley, PA-C  phentermine 37.5 MG capsule Take 37.5 mg by mouth every morning.    Historical Provider, MD  QUEtiapine (SEROQUEL) 50  MG tablet Take 50 mg by mouth at bedtime.    Historical Provider, MD  tiZANidine (ZANAFLEX) 4 MG tablet Take 4 mg by mouth every 8 (eight) hours as needed for muscle spasms.    Historical Provider, MD  verapamil (VERELAN PM) 360 MG 24 hr capsule Take 360 mg by mouth every morning.    Historical Provider, MD  vitamin B-12 (CYANOCOBALAMIN) 1000 MCG tablet Take 1,000 mcg by mouth daily.    Historical Provider, MD  vitamin E 100 UNIT capsule Take 300 Units by mouth daily.     Historical Provider, MD    Family History Family History  Problem Relation Age of Onset  . Hypertension Mother   . Diabetes Mother   . Stroke Father   . Depression Brother     Social History Social History  Substance Use Topics  . Smoking status: Never Smoker  . Smokeless tobacco: Never Used  . Alcohol use No     Allergies   Morphine and  related; Penicillins; and Tramadol   Review of Systems Review of Systems  Constitutional: Negative for fever.  HENT: Negative for congestion.   Eyes: Negative for visual disturbance.  Respiratory: Negative for cough and shortness of breath.   Cardiovascular: Negative for chest pain and leg swelling.  Gastrointestinal: Negative for abdominal pain.  Genitourinary: Negative for difficulty urinating.  Musculoskeletal: Positive for joint swelling (4th toe).  Skin: Positive for color change (redness over 4th toe).  Neurological: Negative for headaches.  Hematological: Does not bruise/bleed easily.  Psychiatric/Behavioral: Negative.      Physical Exam Updated Vital Signs BP 153/82 (BP Location: Left Arm)   Pulse 70   Temp 98 F (36.7 C) (Oral)   Resp 18   Ht 5\' 5"  (1.651 m)   Wt 107 kg   SpO2 98%   BMI 39.27 kg/m   Physical Exam  Constitutional: She is oriented to person, place, and time. Vital signs are normal. She appears well-developed and well-nourished.  HENT:  Head: Normocephalic and atraumatic.  Eyes: EOM are normal. Pupils are equal, round, and reactive to light.  Neck: Normal range of motion.  Cardiovascular: Normal rate.   Pulmonary/Chest: Effort normal.  Musculoskeletal:  Mild edema and erythema over L 4th toe.  Point tenderness of base of the proximal phalanx of 4th toe.  No tenderness over metatarsal or MTP of L 4th toe. Decreased ROM of L 4th toe due to pain.   Neurological: She is alert and oriented to person, place, and time.  Skin: Skin is warm and dry.  Psychiatric: She has a normal mood and affect. Her behavior is normal.  Nursing note and vitals reviewed.    ED Treatments / Results  Labs (all labs ordered are listed, but only abnormal results are displayed) Labs Reviewed - No data to display  EKG  EKG Interpretation None       Radiology Dg Foot Complete Left  Result Date: 10/06/2016 CLINICAL DATA:  Third and fourth toe pain and swelling  after hitting toes on door last night EXAM: LEFT FOOT - COMPLETE 3+ VIEW COMPARISON:  None. FINDINGS: Oblique fracture identified proximal phalanx fourth toe. No evidence for fracture involving the middle toe. IMPRESSION: Oblique fracture proximal phalanx fourth toe Electronically Signed   By: Kennith Center M.D.   On: 10/06/2016 18:45    Procedures Procedures (including critical care time)  Medications Ordered in ED Medications  acetaminophen (TYLENOL) tablet 1,000 mg (1,000 mg Oral Given 10/06/16 2119)  Initial Impression / Assessment and Plan / ED Course  I have reviewed the triage vital signs and the nursing notes.  Pertinent labs & imaging results that were available during my care of the patient were reviewed by me and considered in my medical decision making (see chart for details).  Clinical Course    X-rays remarkable for L 4th toe oblique proximal phalanx fracture.  Pt given tylenol for pain in ED. Toe buddy taped, pt placed in hard sole post op shoe.  Pt declined crutches.  Toe fracture care educational resources given to pt.  Pt instructed to keep hard sole shoe for 4-6 weeks until toe is no longer tender and she is able to ambulate pain free.  Pt instructed to take tylenol or ibuprofen for pain, rest, ice, elevate.  Pt instructed to f/u with PCP in 1-2 weeks for re-evaluation.  No need for ortho referral for stable toe fracture.  Pt given work note.  ED return instructions given, pt verbalized understanding and agreeable to dispo plan.   Final Clinical Impressions(s) / ED Diagnoses   Final diagnoses:  Closed nondisplaced fracture of proximal phalanx of lesser toe of left foot, initial encounter  Toe pain, left    New Prescriptions Discharge Medication List as of 10/06/2016  8:45 PM       Liberty Handylaudia J Emalie Mcwethy, PA-C 10/07/16 1103    Melene Planan Floyd, DO 10/07/16 1605

## 2016-10-21 DIAGNOSIS — I119 Hypertensive heart disease without heart failure: Secondary | ICD-10-CM | POA: Diagnosis not present

## 2016-10-21 DIAGNOSIS — Z96653 Presence of artificial knee joint, bilateral: Secondary | ICD-10-CM | POA: Diagnosis not present

## 2016-10-21 DIAGNOSIS — E785 Hyperlipidemia, unspecified: Secondary | ICD-10-CM | POA: Diagnosis not present

## 2016-10-21 DIAGNOSIS — J45909 Unspecified asthma, uncomplicated: Secondary | ICD-10-CM | POA: Diagnosis not present

## 2016-10-21 DIAGNOSIS — I10 Essential (primary) hypertension: Secondary | ICD-10-CM | POA: Diagnosis not present

## 2016-11-02 DIAGNOSIS — S92515A Nondisplaced fracture of proximal phalanx of left lesser toe(s), initial encounter for closed fracture: Secondary | ICD-10-CM | POA: Diagnosis not present

## 2016-11-15 DIAGNOSIS — J45909 Unspecified asthma, uncomplicated: Secondary | ICD-10-CM | POA: Diagnosis not present

## 2016-11-15 DIAGNOSIS — Z96653 Presence of artificial knee joint, bilateral: Secondary | ICD-10-CM | POA: Diagnosis not present

## 2016-11-15 DIAGNOSIS — I119 Hypertensive heart disease without heart failure: Secondary | ICD-10-CM | POA: Diagnosis not present

## 2016-11-15 DIAGNOSIS — E785 Hyperlipidemia, unspecified: Secondary | ICD-10-CM | POA: Diagnosis not present

## 2016-11-15 DIAGNOSIS — I10 Essential (primary) hypertension: Secondary | ICD-10-CM | POA: Diagnosis not present

## 2016-11-22 DIAGNOSIS — M25562 Pain in left knee: Secondary | ICD-10-CM | POA: Diagnosis not present

## 2016-11-22 DIAGNOSIS — M25561 Pain in right knee: Secondary | ICD-10-CM | POA: Diagnosis not present

## 2016-11-23 ENCOUNTER — Other Ambulatory Visit (HOSPITAL_COMMUNITY): Payer: Self-pay | Admitting: Family Medicine

## 2016-11-23 DIAGNOSIS — M25561 Pain in right knee: Secondary | ICD-10-CM

## 2016-11-23 DIAGNOSIS — M25562 Pain in left knee: Principal | ICD-10-CM

## 2016-11-25 DIAGNOSIS — F319 Bipolar disorder, unspecified: Secondary | ICD-10-CM | POA: Diagnosis not present

## 2016-12-10 ENCOUNTER — Encounter (HOSPITAL_COMMUNITY): Payer: Medicare Other

## 2016-12-23 DIAGNOSIS — F319 Bipolar disorder, unspecified: Secondary | ICD-10-CM | POA: Diagnosis not present

## 2016-12-28 ENCOUNTER — Encounter (HOSPITAL_COMMUNITY)
Admission: RE | Admit: 2016-12-28 | Discharge: 2016-12-28 | Disposition: A | Discharge status: Home / Self Care | Payer: Medicare Other | Source: Ambulatory Visit | Attending: Family Medicine | Admitting: Family Medicine

## 2016-12-28 ENCOUNTER — Encounter (HOSPITAL_COMMUNITY): Payer: Medicare Other

## 2016-12-28 DIAGNOSIS — M25562 Pain in left knee: Secondary | ICD-10-CM | POA: Insufficient documentation

## 2016-12-28 DIAGNOSIS — M25561 Pain in right knee: Secondary | ICD-10-CM

## 2016-12-28 MED ORDER — TECHNETIUM TC 99M MEDRONATE IV KIT
21.5000 | PACK | Freq: Once | INTRAVENOUS | Status: AC | PRN
  Administered 2016-12-28: 21.5 via INTRAVENOUS

## 2017-01-05 DIAGNOSIS — M25562 Pain in left knee: Secondary | ICD-10-CM | POA: Diagnosis not present

## 2017-01-05 DIAGNOSIS — M25561 Pain in right knee: Secondary | ICD-10-CM | POA: Diagnosis not present

## 2017-01-13 DIAGNOSIS — Z96653 Presence of artificial knee joint, bilateral: Secondary | ICD-10-CM | POA: Diagnosis not present

## 2017-01-13 DIAGNOSIS — I119 Hypertensive heart disease without heart failure: Secondary | ICD-10-CM | POA: Diagnosis not present

## 2017-01-13 DIAGNOSIS — I10 Essential (primary) hypertension: Secondary | ICD-10-CM | POA: Diagnosis not present

## 2017-01-13 DIAGNOSIS — J209 Acute bronchitis, unspecified: Secondary | ICD-10-CM | POA: Diagnosis not present

## 2017-01-13 DIAGNOSIS — J45909 Unspecified asthma, uncomplicated: Secondary | ICD-10-CM | POA: Diagnosis not present

## 2017-01-13 DIAGNOSIS — R7303 Prediabetes: Secondary | ICD-10-CM | POA: Diagnosis not present

## 2017-01-13 DIAGNOSIS — E785 Hyperlipidemia, unspecified: Secondary | ICD-10-CM | POA: Diagnosis not present

## 2017-02-02 DIAGNOSIS — F339 Major depressive disorder, recurrent, unspecified: Secondary | ICD-10-CM | POA: Diagnosis not present

## 2017-02-02 DIAGNOSIS — Z124 Encounter for screening for malignant neoplasm of cervix: Secondary | ICD-10-CM | POA: Diagnosis not present

## 2017-02-02 DIAGNOSIS — F319 Bipolar disorder, unspecified: Secondary | ICD-10-CM | POA: Diagnosis not present

## 2017-02-02 DIAGNOSIS — R945 Abnormal results of liver function studies: Secondary | ICD-10-CM | POA: Diagnosis not present

## 2017-02-02 DIAGNOSIS — R7303 Prediabetes: Secondary | ICD-10-CM | POA: Diagnosis not present

## 2017-02-02 DIAGNOSIS — J45909 Unspecified asthma, uncomplicated: Secondary | ICD-10-CM | POA: Diagnosis not present

## 2017-02-02 DIAGNOSIS — Z96653 Presence of artificial knee joint, bilateral: Secondary | ICD-10-CM | POA: Diagnosis not present

## 2017-02-02 DIAGNOSIS — I10 Essential (primary) hypertension: Secondary | ICD-10-CM | POA: Diagnosis not present

## 2017-02-02 DIAGNOSIS — I119 Hypertensive heart disease without heart failure: Secondary | ICD-10-CM | POA: Diagnosis not present

## 2017-02-02 DIAGNOSIS — E785 Hyperlipidemia, unspecified: Secondary | ICD-10-CM | POA: Diagnosis not present

## 2017-03-16 DIAGNOSIS — J45909 Unspecified asthma, uncomplicated: Secondary | ICD-10-CM | POA: Diagnosis not present

## 2017-03-16 DIAGNOSIS — E785 Hyperlipidemia, unspecified: Secondary | ICD-10-CM | POA: Diagnosis not present

## 2017-03-16 DIAGNOSIS — Z96653 Presence of artificial knee joint, bilateral: Secondary | ICD-10-CM | POA: Diagnosis not present

## 2017-03-16 DIAGNOSIS — R7303 Prediabetes: Secondary | ICD-10-CM | POA: Diagnosis not present

## 2017-03-16 DIAGNOSIS — F339 Major depressive disorder, recurrent, unspecified: Secondary | ICD-10-CM | POA: Diagnosis not present

## 2017-03-16 DIAGNOSIS — I10 Essential (primary) hypertension: Secondary | ICD-10-CM | POA: Diagnosis not present

## 2017-03-16 DIAGNOSIS — I119 Hypertensive heart disease without heart failure: Secondary | ICD-10-CM | POA: Diagnosis not present

## 2017-03-16 DIAGNOSIS — F319 Bipolar disorder, unspecified: Secondary | ICD-10-CM | POA: Diagnosis not present

## 2017-03-16 DIAGNOSIS — Z124 Encounter for screening for malignant neoplasm of cervix: Secondary | ICD-10-CM | POA: Diagnosis not present

## 2017-03-21 DIAGNOSIS — I119 Hypertensive heart disease without heart failure: Secondary | ICD-10-CM | POA: Diagnosis not present

## 2017-03-21 DIAGNOSIS — Z96653 Presence of artificial knee joint, bilateral: Secondary | ICD-10-CM | POA: Diagnosis not present

## 2017-03-21 DIAGNOSIS — I10 Essential (primary) hypertension: Secondary | ICD-10-CM | POA: Diagnosis not present

## 2017-03-21 DIAGNOSIS — F339 Major depressive disorder, recurrent, unspecified: Secondary | ICD-10-CM | POA: Diagnosis not present

## 2017-03-21 DIAGNOSIS — F319 Bipolar disorder, unspecified: Secondary | ICD-10-CM | POA: Diagnosis not present

## 2017-03-21 DIAGNOSIS — M5441 Lumbago with sciatica, right side: Secondary | ICD-10-CM | POA: Diagnosis not present

## 2017-03-21 DIAGNOSIS — R7303 Prediabetes: Secondary | ICD-10-CM | POA: Diagnosis not present

## 2017-03-21 DIAGNOSIS — E785 Hyperlipidemia, unspecified: Secondary | ICD-10-CM | POA: Diagnosis not present

## 2017-03-21 DIAGNOSIS — J45909 Unspecified asthma, uncomplicated: Secondary | ICD-10-CM | POA: Diagnosis not present

## 2017-03-21 DIAGNOSIS — R202 Paresthesia of skin: Secondary | ICD-10-CM | POA: Diagnosis not present

## 2017-04-12 DIAGNOSIS — M79671 Pain in right foot: Secondary | ICD-10-CM | POA: Diagnosis not present

## 2017-04-12 DIAGNOSIS — G5761 Lesion of plantar nerve, right lower limb: Secondary | ICD-10-CM | POA: Diagnosis not present

## 2017-04-12 DIAGNOSIS — M79672 Pain in left foot: Secondary | ICD-10-CM | POA: Diagnosis not present

## 2017-04-12 DIAGNOSIS — G5762 Lesion of plantar nerve, left lower limb: Secondary | ICD-10-CM | POA: Diagnosis not present

## 2017-04-12 DIAGNOSIS — M21612 Bunion of left foot: Secondary | ICD-10-CM | POA: Diagnosis not present

## 2017-04-27 DIAGNOSIS — Z96653 Presence of artificial knee joint, bilateral: Secondary | ICD-10-CM | POA: Diagnosis not present

## 2017-04-27 DIAGNOSIS — J45909 Unspecified asthma, uncomplicated: Secondary | ICD-10-CM | POA: Diagnosis not present

## 2017-04-27 DIAGNOSIS — F319 Bipolar disorder, unspecified: Secondary | ICD-10-CM | POA: Diagnosis not present

## 2017-04-27 DIAGNOSIS — M5441 Lumbago with sciatica, right side: Secondary | ICD-10-CM | POA: Diagnosis not present

## 2017-04-27 DIAGNOSIS — E785 Hyperlipidemia, unspecified: Secondary | ICD-10-CM | POA: Diagnosis not present

## 2017-04-27 DIAGNOSIS — R202 Paresthesia of skin: Secondary | ICD-10-CM | POA: Diagnosis not present

## 2017-04-27 DIAGNOSIS — F339 Major depressive disorder, recurrent, unspecified: Secondary | ICD-10-CM | POA: Diagnosis not present

## 2017-04-27 DIAGNOSIS — I119 Hypertensive heart disease without heart failure: Secondary | ICD-10-CM | POA: Diagnosis not present

## 2017-04-27 DIAGNOSIS — R7303 Prediabetes: Secondary | ICD-10-CM | POA: Diagnosis not present

## 2017-04-27 DIAGNOSIS — I10 Essential (primary) hypertension: Secondary | ICD-10-CM | POA: Diagnosis not present

## 2017-04-29 DIAGNOSIS — F339 Major depressive disorder, recurrent, unspecified: Secondary | ICD-10-CM | POA: Diagnosis not present

## 2017-04-29 DIAGNOSIS — I10 Essential (primary) hypertension: Secondary | ICD-10-CM | POA: Diagnosis not present

## 2017-04-29 DIAGNOSIS — R7303 Prediabetes: Secondary | ICD-10-CM | POA: Diagnosis not present

## 2017-04-29 DIAGNOSIS — J45909 Unspecified asthma, uncomplicated: Secondary | ICD-10-CM | POA: Diagnosis not present

## 2017-04-29 DIAGNOSIS — Z96653 Presence of artificial knee joint, bilateral: Secondary | ICD-10-CM | POA: Diagnosis not present

## 2017-04-29 DIAGNOSIS — R202 Paresthesia of skin: Secondary | ICD-10-CM | POA: Diagnosis not present

## 2017-04-29 DIAGNOSIS — E785 Hyperlipidemia, unspecified: Secondary | ICD-10-CM | POA: Diagnosis not present

## 2017-04-29 DIAGNOSIS — F319 Bipolar disorder, unspecified: Secondary | ICD-10-CM | POA: Diagnosis not present

## 2017-04-29 DIAGNOSIS — T148XXA Other injury of unspecified body region, initial encounter: Secondary | ICD-10-CM | POA: Diagnosis not present

## 2017-04-29 DIAGNOSIS — M5441 Lumbago with sciatica, right side: Secondary | ICD-10-CM | POA: Diagnosis not present

## 2017-04-29 DIAGNOSIS — I119 Hypertensive heart disease without heart failure: Secondary | ICD-10-CM | POA: Diagnosis not present

## 2017-05-03 DIAGNOSIS — M21612 Bunion of left foot: Secondary | ICD-10-CM | POA: Diagnosis not present

## 2017-05-03 DIAGNOSIS — M21611 Bunion of right foot: Secondary | ICD-10-CM | POA: Diagnosis not present

## 2017-05-03 DIAGNOSIS — G5761 Lesion of plantar nerve, right lower limb: Secondary | ICD-10-CM | POA: Diagnosis not present

## 2017-05-03 DIAGNOSIS — G5762 Lesion of plantar nerve, left lower limb: Secondary | ICD-10-CM | POA: Diagnosis not present

## 2017-06-02 DIAGNOSIS — I10 Essential (primary) hypertension: Secondary | ICD-10-CM | POA: Diagnosis not present

## 2017-06-02 DIAGNOSIS — R202 Paresthesia of skin: Secondary | ICD-10-CM | POA: Diagnosis not present

## 2017-06-02 DIAGNOSIS — F319 Bipolar disorder, unspecified: Secondary | ICD-10-CM | POA: Diagnosis not present

## 2017-06-02 DIAGNOSIS — M5441 Lumbago with sciatica, right side: Secondary | ICD-10-CM | POA: Diagnosis not present

## 2017-06-02 DIAGNOSIS — I119 Hypertensive heart disease without heart failure: Secondary | ICD-10-CM | POA: Diagnosis not present

## 2017-06-02 DIAGNOSIS — R7303 Prediabetes: Secondary | ICD-10-CM | POA: Diagnosis not present

## 2017-06-02 DIAGNOSIS — E785 Hyperlipidemia, unspecified: Secondary | ICD-10-CM | POA: Diagnosis not present

## 2017-06-02 DIAGNOSIS — J45909 Unspecified asthma, uncomplicated: Secondary | ICD-10-CM | POA: Diagnosis not present

## 2017-06-02 DIAGNOSIS — F339 Major depressive disorder, recurrent, unspecified: Secondary | ICD-10-CM | POA: Diagnosis not present

## 2017-06-02 DIAGNOSIS — Z96653 Presence of artificial knee joint, bilateral: Secondary | ICD-10-CM | POA: Diagnosis not present

## 2017-06-16 DIAGNOSIS — M25561 Pain in right knee: Secondary | ICD-10-CM | POA: Diagnosis not present

## 2017-06-16 DIAGNOSIS — M25562 Pain in left knee: Secondary | ICD-10-CM | POA: Diagnosis not present

## 2017-06-28 DIAGNOSIS — Z0110 Encounter for examination of ears and hearing without abnormal findings: Secondary | ICD-10-CM | POA: Diagnosis not present

## 2017-06-28 DIAGNOSIS — I10 Essential (primary) hypertension: Secondary | ICD-10-CM | POA: Diagnosis not present

## 2017-06-28 DIAGNOSIS — I119 Hypertensive heart disease without heart failure: Secondary | ICD-10-CM | POA: Diagnosis not present

## 2017-06-28 DIAGNOSIS — M5441 Lumbago with sciatica, right side: Secondary | ICD-10-CM | POA: Diagnosis not present

## 2017-06-28 DIAGNOSIS — H539 Unspecified visual disturbance: Secondary | ICD-10-CM | POA: Diagnosis not present

## 2017-06-28 DIAGNOSIS — E559 Vitamin D deficiency, unspecified: Secondary | ICD-10-CM | POA: Diagnosis not present

## 2017-06-28 DIAGNOSIS — E785 Hyperlipidemia, unspecified: Secondary | ICD-10-CM | POA: Diagnosis not present

## 2017-06-28 DIAGNOSIS — R202 Paresthesia of skin: Secondary | ICD-10-CM | POA: Diagnosis not present

## 2017-06-28 DIAGNOSIS — J45909 Unspecified asthma, uncomplicated: Secondary | ICD-10-CM | POA: Diagnosis not present

## 2017-06-28 DIAGNOSIS — F319 Bipolar disorder, unspecified: Secondary | ICD-10-CM | POA: Diagnosis not present

## 2017-06-28 DIAGNOSIS — Z113 Encounter for screening for infections with a predominantly sexual mode of transmission: Secondary | ICD-10-CM | POA: Diagnosis not present

## 2017-06-28 DIAGNOSIS — Z131 Encounter for screening for diabetes mellitus: Secondary | ICD-10-CM | POA: Diagnosis not present

## 2017-06-28 DIAGNOSIS — R7303 Prediabetes: Secondary | ICD-10-CM | POA: Diagnosis not present

## 2017-06-28 DIAGNOSIS — Z96653 Presence of artificial knee joint, bilateral: Secondary | ICD-10-CM | POA: Diagnosis not present

## 2017-06-28 DIAGNOSIS — F339 Major depressive disorder, recurrent, unspecified: Secondary | ICD-10-CM | POA: Diagnosis not present

## 2017-06-29 DIAGNOSIS — F319 Bipolar disorder, unspecified: Secondary | ICD-10-CM | POA: Diagnosis not present

## 2017-07-04 ENCOUNTER — Other Ambulatory Visit: Payer: Self-pay | Admitting: Internal Medicine

## 2017-07-04 DIAGNOSIS — Z1239 Encounter for other screening for malignant neoplasm of breast: Secondary | ICD-10-CM

## 2017-07-06 ENCOUNTER — Ambulatory Visit: Payer: Medicare Other

## 2017-07-06 ENCOUNTER — Ambulatory Visit
Admission: RE | Admit: 2017-07-06 | Discharge: 2017-07-06 | Disposition: A | Discharge status: Home / Self Care | Payer: Medicare Other | Source: Ambulatory Visit | Attending: Internal Medicine | Admitting: Internal Medicine

## 2017-07-06 DIAGNOSIS — Z1231 Encounter for screening mammogram for malignant neoplasm of breast: Secondary | ICD-10-CM | POA: Diagnosis not present

## 2017-07-06 DIAGNOSIS — Z1239 Encounter for other screening for malignant neoplasm of breast: Secondary | ICD-10-CM

## 2017-08-17 DIAGNOSIS — G5761 Lesion of plantar nerve, right lower limb: Secondary | ICD-10-CM | POA: Diagnosis not present

## 2017-08-17 DIAGNOSIS — M21611 Bunion of right foot: Secondary | ICD-10-CM | POA: Diagnosis not present

## 2017-08-17 DIAGNOSIS — M898X7 Other specified disorders of bone, ankle and foot: Secondary | ICD-10-CM | POA: Diagnosis not present

## 2017-08-17 DIAGNOSIS — M21612 Bunion of left foot: Secondary | ICD-10-CM | POA: Diagnosis not present

## 2017-09-06 DIAGNOSIS — Z23 Encounter for immunization: Secondary | ICD-10-CM | POA: Diagnosis not present

## 2017-09-06 DIAGNOSIS — J45909 Unspecified asthma, uncomplicated: Secondary | ICD-10-CM | POA: Diagnosis not present

## 2017-09-06 DIAGNOSIS — I119 Hypertensive heart disease without heart failure: Secondary | ICD-10-CM | POA: Diagnosis not present

## 2017-09-06 DIAGNOSIS — Z0000 Encounter for general adult medical examination without abnormal findings: Secondary | ICD-10-CM | POA: Diagnosis not present

## 2017-09-06 DIAGNOSIS — F339 Major depressive disorder, recurrent, unspecified: Secondary | ICD-10-CM | POA: Diagnosis not present

## 2017-09-06 DIAGNOSIS — I2510 Atherosclerotic heart disease of native coronary artery without angina pectoris: Secondary | ICD-10-CM | POA: Diagnosis not present

## 2017-09-06 DIAGNOSIS — I10 Essential (primary) hypertension: Secondary | ICD-10-CM | POA: Diagnosis not present

## 2017-09-06 DIAGNOSIS — E785 Hyperlipidemia, unspecified: Secondary | ICD-10-CM | POA: Diagnosis not present

## 2017-09-15 DIAGNOSIS — F319 Bipolar disorder, unspecified: Secondary | ICD-10-CM | POA: Diagnosis not present

## 2017-10-25 DIAGNOSIS — I10 Essential (primary) hypertension: Secondary | ICD-10-CM | POA: Diagnosis not present

## 2017-10-25 DIAGNOSIS — M21611 Bunion of right foot: Secondary | ICD-10-CM | POA: Diagnosis not present

## 2017-10-25 DIAGNOSIS — M2041 Other hammer toe(s) (acquired), right foot: Secondary | ICD-10-CM | POA: Diagnosis not present

## 2017-10-25 DIAGNOSIS — M898X7 Other specified disorders of bone, ankle and foot: Secondary | ICD-10-CM | POA: Diagnosis not present

## 2017-10-25 DIAGNOSIS — F339 Major depressive disorder, recurrent, unspecified: Secondary | ICD-10-CM | POA: Diagnosis not present

## 2017-10-25 DIAGNOSIS — E785 Hyperlipidemia, unspecified: Secondary | ICD-10-CM | POA: Diagnosis not present

## 2017-10-25 DIAGNOSIS — I2510 Atherosclerotic heart disease of native coronary artery without angina pectoris: Secondary | ICD-10-CM | POA: Diagnosis not present

## 2017-10-25 DIAGNOSIS — I119 Hypertensive heart disease without heart failure: Secondary | ICD-10-CM | POA: Diagnosis not present

## 2017-10-25 DIAGNOSIS — G5761 Lesion of plantar nerve, right lower limb: Secondary | ICD-10-CM | POA: Diagnosis not present

## 2017-10-25 DIAGNOSIS — J45909 Unspecified asthma, uncomplicated: Secondary | ICD-10-CM | POA: Diagnosis not present

## 2017-10-27 ENCOUNTER — Encounter (HOSPITAL_COMMUNITY): Payer: Self-pay | Admitting: Emergency Medicine

## 2017-10-27 ENCOUNTER — Ambulatory Visit (HOSPITAL_COMMUNITY)
Admission: EM | Admit: 2017-10-27 | Discharge: 2017-10-27 | Disposition: A | Discharge status: Home / Self Care | Payer: Medicare Other | Attending: Family Medicine | Admitting: Family Medicine

## 2017-10-27 ENCOUNTER — Other Ambulatory Visit: Payer: Self-pay

## 2017-10-27 DIAGNOSIS — J111 Influenza due to unidentified influenza virus with other respiratory manifestations: Secondary | ICD-10-CM

## 2017-10-27 DIAGNOSIS — R69 Illness, unspecified: Secondary | ICD-10-CM | POA: Diagnosis not present

## 2017-10-27 MED ORDER — ONDANSETRON 4 MG PO TBDP
ORAL_TABLET | ORAL | Status: AC
  Filled 2017-10-27: qty 1

## 2017-10-27 MED ORDER — ACETAMINOPHEN 325 MG PO TABS
975.0000 mg | ORAL_TABLET | Freq: Once | ORAL | Status: AC
  Administered 2017-10-27: 975 mg via ORAL

## 2017-10-27 MED ORDER — ONDANSETRON 4 MG PO TBDP
4.0000 mg | ORAL_TABLET | Freq: Once | ORAL | Status: AC
  Administered 2017-10-27: 4 mg via ORAL

## 2017-10-27 MED ORDER — ONDANSETRON HCL 4 MG PO TABS: 4.0000 mg | ORAL_TABLET | Freq: Three times a day (TID) | ORAL | 0 refills | Status: DC | PRN

## 2017-10-27 MED ORDER — ACETAMINOPHEN 325 MG PO TABS
ORAL_TABLET | ORAL | Status: AC
  Filled 2017-10-27: qty 3

## 2017-10-27 NOTE — Discharge Instructions (Signed)
Liquid diet until abdominal symptoms improve. Push fluids. Bland diet as tolerated. Tylenol as needed for pain. Zofran every 8 hours as needed for nausea. If develop increased abdominal pain, vomiting, fevers, dehydration or otherwise worsening returning to be seen. If no improvement in the next week return to be seen or follow with your primary care provider.

## 2017-10-27 NOTE — ED Triage Notes (Signed)
Pt states "since Saturday i've had a lot of nasal congestion and cough, and i've been nauseated and threw up."

## 2017-10-27 NOTE — ED Provider Notes (Signed)
[facility]    CSN: [id number] Arrival date & time: 10/27/17  1244     History   Chief Complaint Chief Complaint  Patient presents with  . Cough  . Nasal Congestion    HPI Jo Allen is a 52 y.o. female.   Jo Allen presents with complaints of cough, congestion, left ear pain which started approximately 4 days ago, nausea, vomiting and diarrhea started yesterday. Vomited twice yesterday and diarrhea x1 today. Mild episgastric pain, history of gerd does not take medication for this. Feels she has nigh sweats but no other known fevers. No known ill contacts. Does not smoke. Headache, rates it 7/10. Without shortness of breath. Has not taken any medications for symptoms.    ROS per HPI.       Past Medical History:  Diagnosis Date  . Asthma   . CAD (coronary artery disease)   . GERD (gastroesophageal reflux disease)   . Heart disease   . Hypertension   . Obesity   . Paresthesia 08/20/2015  . Prediabetes   . Transfusion history    4yrs ago    Patient Active Problem List   Diagnosis Date Noted  . Paresthesia 08/20/2015    Past Surgical History:  Procedure Laterality Date  . ANGIOPLASTY    . BREAST BIOPSY Left   . BREAST BIOPSY WITH SENTINEL LYMPH NODE BIOPSY AND NEEDLE LOCALIZATION Left   . COLONOSCOPY WITH PROPOFOL N/A 02/19/2014   Procedure: COLONOSCOPY WITH PROPOFOL;  Surgeon: Charolett Bumpers, MD;  Location: [facility];  Service: Endoscopy;  Laterality: N/A;  . JOINT REPLACEMENT Bilateral   . TOTAL KNEE ARTHROPLASTY Bilateral     OB History    No data available       Home Medications    Prior to Admission medications   Medication Sig Start Date End Date Taking? Authorizing Provider  atorvastatin (LIPITOR) 10 MG tablet Take 10 mg by mouth every morning.   Yes [provider]  buPROPion (WELLBUTRIN XL) 300 MG 24 hr tablet Take 300 mg by mouth daily. 02/13/16  Yes [provider]  CRANBERRY PO Take 1 tablet by mouth  daily.   Yes [provider]  gabapentin (NEURONTIN) 300 MG capsule Take 300 mg by mouth 3 (three) times daily.   Yes [provider]  hydrochlorothiazide (HYDRODIURIL) 25 MG tablet Take 25 mg by mouth daily.   Yes [provider]  lamoTRIgine (LAMICTAL) 100 MG tablet Take 100 mg by mouth daily.   Yes [provider]  QUEtiapine (SEROQUEL) 50 MG tablet Take 50 mg by mouth at bedtime.   Yes [provider]  vitamin B-12 (CYANOCOBALAMIN) 1000 MCG tablet Take 1,000 mcg by mouth daily.   Yes [provider]  vitamin E 100 UNIT capsule Take 300 Units by mouth daily.    Yes [provider]  diazepam (VALIUM) 5 MG tablet Take 1 tablet (5 mg total) by mouth every 6 (six) hours as needed for anxiety or muscle spasms. Patient not taking: Reported on 10/27/2017 11/20/14   Trixie Dredge, PA-C  DULoxetine (CYMBALTA) 30 MG capsule 1 tablet daily for 2 weeks, then take 1 twice daily Patient not taking: Reported on 10/27/2017 12/15/15   York Spaniel, MD  LYRICA 50 MG capsule Take 50 mg by mouth 2 (two) times daily. 01/12/16   [provider]  meloxicam (MOBIC) 15 MG tablet Take 15 mg by mouth daily.    [provider]  metoCLOPramide (REGLAN) 10 MG tablet  Take 1 tablet (10 mg total) by mouth every 6 (six) hours as needed for nausea (or headache). Patient not taking: Reported on 10/27/2017 02/27/16   Dione Booze, MD  mirtazapine (REMERON) 15 MG tablet Take 15 mg by mouth daily as needed (sleep).     [provider]  naproxen (NAPROSYN) 500 MG tablet Take 1 tablet (500 mg total) by mouth 2 (two) times daily. Patient not taking: Reported on 10/27/2017 10/06/16   Liberty Handy, PA-C  ondansetron (ZOFRAN) 4 MG tablet Take 1 tablet (4 mg total) by mouth every 8 (eight) hours as needed for nausea or vomiting. 10/27/17   Linus Mako B, NP  phentermine 37.5 MG capsule Take 37.5 mg by mouth every morning.    [provider]   tiZANidine (ZANAFLEX) 4 MG tablet Take 4 mg by mouth every 8 (eight) hours as needed for muscle spasms.    [provider]  verapamil (VERELAN PM) 360 MG 24 hr capsule Take 360 mg by mouth every morning.    [provider]    Family History Family History  Problem Relation Age of Onset  . Hypertension Mother   . Diabetes Mother   . Stroke Father   . Depression Brother   . Breast cancer Neg Hx     Social History Social History   Tobacco Use  . Smoking status: Never Smoker  . Smokeless tobacco: Never Used  Substance Use Topics  . Alcohol use: No  . Drug use: No     Allergies   Morphine and related; Penicillins; and Tramadol   Review of Systems Review of Systems   Physical Exam Triage Vital Signs ED Triage Vitals [10/27/17 1323]  Enc Vitals Group     BP 130/78     Pulse Rate 62     Resp 18     Temp 98.2 F (36.8 C)     Temp src      SpO2 100 %     Weight      Height      Head Circumference      Peak Flow      Pain Score 7     Pain Loc      Pain Edu?      Excl. in GC?    No data found.  Updated Vital Signs BP 130/78   Pulse 62   Temp 98.2 F (36.8 C)   Resp 18   SpO2 100%   Visual Acuity Right Eye Distance:   Left Eye Distance:   Bilateral Distance:    Right Eye Near:   Left Eye Near:    Bilateral Near:     Physical Exam  Constitutional: She is oriented to person, place, and time. She appears well-developed and well-nourished. No distress.  HENT:  Head: Normocephalic and atraumatic.  Right Ear: Tympanic membrane, external ear and ear canal normal.  Left Ear: Tympanic membrane, external ear and ear canal normal.  Nose: Nose normal.  Mouth/Throat: Uvula is midline, oropharynx is clear and moist and mucous membranes are normal. No tonsillar exudate.  Eyes: Conjunctivae and EOM are normal. Pupils are equal, round, and reactive to light.  Cardiovascular: Normal rate, regular rhythm and normal heart sounds.  Pulmonary/Chest:  Effort normal and breath sounds normal.  Abdominal: There is generalized tenderness. There is no rigidity, no rebound, no guarding and no CVA tenderness.  Neurological: She is alert and oriented to person, place, and time.  Skin: Skin is warm and dry.  UC Treatments / Results  Labs (all labs ordered are listed, but only abnormal results are displayed) Labs Reviewed - No data to display  EKG  EKG Interpretation None       Radiology No results found.  Procedures Procedures (including critical care time)  Medications Ordered in UC Medications  acetaminophen (TYLENOL) tablet 975 mg (not administered)  ondansetron (ZOFRAN-ODT) disintegrating tablet 4 mg (not administered)     Initial Impression / Assessment and Plan / UC Course  I have reviewed the triage vital signs and the nursing notes.  Pertinent labs & imaging results that were available during my care of the patient were reviewed by me and considered in my medical decision making (see chart for details).     Benign physical findings, non specific abdominal pain s/p vomiting and with loose stool. Consistent with viral illness. Without fever, tachycardia, hypoxia or tachypnea. Push fluids, liquid diet until symptoms improve. Zantac, which she has already. zofran as needed. If symptoms worsen or do not improve in the next week to return to be seen or to follow up with PCP. Patient verbalized understanding and agreeable to plan.    Final Clinical Impressions(s) / UC Diagnoses   Final diagnoses:  Influenza-like illness    ED Discharge Orders        Ordered    ondansetron (ZOFRAN) 4 MG tablet  Every 8 hours PRN     10/27/17 1405       Controlled Substance Prescriptions [place] Controlled Substance Registry consulted? Not Applicable   Georgetta HaberBurky, Herb Beltre B, NP 10/27/17 1439

## 2017-10-28 DIAGNOSIS — R0609 Other forms of dyspnea: Secondary | ICD-10-CM | POA: Diagnosis not present

## 2017-10-28 DIAGNOSIS — E782 Mixed hyperlipidemia: Secondary | ICD-10-CM | POA: Diagnosis not present

## 2017-10-28 DIAGNOSIS — I10 Essential (primary) hypertension: Secondary | ICD-10-CM | POA: Diagnosis not present

## 2017-10-28 DIAGNOSIS — I25119 Atherosclerotic heart disease of native coronary artery with unspecified angina pectoris: Secondary | ICD-10-CM | POA: Diagnosis not present

## 2017-10-31 DIAGNOSIS — R0602 Shortness of breath: Secondary | ICD-10-CM | POA: Diagnosis not present

## 2017-10-31 DIAGNOSIS — R0789 Other chest pain: Secondary | ICD-10-CM | POA: Diagnosis not present

## 2017-10-31 DIAGNOSIS — I10 Essential (primary) hypertension: Secondary | ICD-10-CM | POA: Diagnosis not present

## 2017-10-31 DIAGNOSIS — Z01810 Encounter for preprocedural cardiovascular examination: Secondary | ICD-10-CM | POA: Diagnosis not present

## 2017-11-01 DIAGNOSIS — I739 Peripheral vascular disease, unspecified: Secondary | ICD-10-CM | POA: Diagnosis not present

## 2017-11-01 DIAGNOSIS — E782 Mixed hyperlipidemia: Secondary | ICD-10-CM | POA: Diagnosis not present

## 2017-11-01 DIAGNOSIS — I10 Essential (primary) hypertension: Secondary | ICD-10-CM | POA: Diagnosis not present

## 2017-11-01 DIAGNOSIS — R0609 Other forms of dyspnea: Secondary | ICD-10-CM | POA: Diagnosis not present

## 2017-11-14 DIAGNOSIS — M21611 Bunion of right foot: Secondary | ICD-10-CM | POA: Diagnosis not present

## 2017-11-14 DIAGNOSIS — G5791 Unspecified mononeuropathy of right lower limb: Secondary | ICD-10-CM | POA: Diagnosis not present

## 2017-11-14 DIAGNOSIS — G5761 Lesion of plantar nerve, right lower limb: Secondary | ICD-10-CM | POA: Diagnosis not present

## 2017-11-14 DIAGNOSIS — M898X7 Other specified disorders of bone, ankle and foot: Secondary | ICD-10-CM | POA: Diagnosis not present

## 2017-11-14 DIAGNOSIS — M2041 Other hammer toe(s) (acquired), right foot: Secondary | ICD-10-CM | POA: Diagnosis not present

## 2017-11-14 MED FILL — OXYCODONE-ACETAMINOPHEN 5-3: 5-325 | 6 days supply | Qty: 36 | Fill #0

## 2017-11-28 DIAGNOSIS — M21612 Bunion of left foot: Secondary | ICD-10-CM | POA: Diagnosis not present

## 2017-12-19 DIAGNOSIS — M21612 Bunion of left foot: Secondary | ICD-10-CM | POA: Diagnosis not present

## 2018-01-04 DIAGNOSIS — M21612 Bunion of left foot: Secondary | ICD-10-CM | POA: Diagnosis not present

## 2018-01-09 DIAGNOSIS — I10 Essential (primary) hypertension: Secondary | ICD-10-CM | POA: Diagnosis not present

## 2018-01-09 DIAGNOSIS — I2510 Atherosclerotic heart disease of native coronary artery without angina pectoris: Secondary | ICD-10-CM | POA: Diagnosis not present

## 2018-01-09 DIAGNOSIS — F339 Major depressive disorder, recurrent, unspecified: Secondary | ICD-10-CM | POA: Diagnosis not present

## 2018-01-09 DIAGNOSIS — J45909 Unspecified asthma, uncomplicated: Secondary | ICD-10-CM | POA: Diagnosis not present

## 2018-01-09 DIAGNOSIS — E785 Hyperlipidemia, unspecified: Secondary | ICD-10-CM | POA: Diagnosis not present

## 2018-01-09 DIAGNOSIS — I119 Hypertensive heart disease without heart failure: Secondary | ICD-10-CM | POA: Diagnosis not present

## 2018-01-12 DIAGNOSIS — F319 Bipolar disorder, unspecified: Secondary | ICD-10-CM | POA: Diagnosis not present

## 2018-01-18 DIAGNOSIS — I2510 Atherosclerotic heart disease of native coronary artery without angina pectoris: Secondary | ICD-10-CM | POA: Diagnosis not present

## 2018-01-18 DIAGNOSIS — I10 Essential (primary) hypertension: Secondary | ICD-10-CM | POA: Diagnosis not present

## 2018-02-08 DIAGNOSIS — M79671 Pain in right foot: Secondary | ICD-10-CM | POA: Diagnosis not present

## 2018-02-08 DIAGNOSIS — G5761 Lesion of plantar nerve, right lower limb: Secondary | ICD-10-CM | POA: Diagnosis not present

## 2018-02-20 DIAGNOSIS — I2510 Atherosclerotic heart disease of native coronary artery without angina pectoris: Secondary | ICD-10-CM | POA: Diagnosis not present

## 2018-02-20 DIAGNOSIS — F339 Major depressive disorder, recurrent, unspecified: Secondary | ICD-10-CM | POA: Diagnosis not present

## 2018-02-20 DIAGNOSIS — I10 Essential (primary) hypertension: Secondary | ICD-10-CM | POA: Diagnosis not present

## 2018-02-20 DIAGNOSIS — E785 Hyperlipidemia, unspecified: Secondary | ICD-10-CM | POA: Diagnosis not present

## 2018-02-20 DIAGNOSIS — I119 Hypertensive heart disease without heart failure: Secondary | ICD-10-CM | POA: Diagnosis not present

## 2018-02-20 DIAGNOSIS — J45909 Unspecified asthma, uncomplicated: Secondary | ICD-10-CM | POA: Diagnosis not present

## 2018-02-22 DIAGNOSIS — G5761 Lesion of plantar nerve, right lower limb: Secondary | ICD-10-CM | POA: Diagnosis not present

## 2018-02-22 DIAGNOSIS — M79671 Pain in right foot: Secondary | ICD-10-CM | POA: Diagnosis not present

## 2018-03-01 DIAGNOSIS — I10 Essential (primary) hypertension: Secondary | ICD-10-CM | POA: Diagnosis not present

## 2018-03-06 DIAGNOSIS — R7611 Nonspecific reaction to tuberculin skin test without active tuberculosis: Secondary | ICD-10-CM | POA: Diagnosis not present

## 2018-03-06 DIAGNOSIS — F339 Major depressive disorder, recurrent, unspecified: Secondary | ICD-10-CM | POA: Diagnosis not present

## 2018-03-06 DIAGNOSIS — I119 Hypertensive heart disease without heart failure: Secondary | ICD-10-CM | POA: Diagnosis not present

## 2018-03-06 DIAGNOSIS — I2510 Atherosclerotic heart disease of native coronary artery without angina pectoris: Secondary | ICD-10-CM | POA: Diagnosis not present

## 2018-03-06 DIAGNOSIS — J45909 Unspecified asthma, uncomplicated: Secondary | ICD-10-CM | POA: Diagnosis not present

## 2018-03-06 DIAGNOSIS — Z0100 Encounter for examination of eyes and vision without abnormal findings: Secondary | ICD-10-CM | POA: Diagnosis not present

## 2018-03-06 DIAGNOSIS — Z0110 Encounter for examination of ears and hearing without abnormal findings: Secondary | ICD-10-CM | POA: Diagnosis not present

## 2018-03-06 DIAGNOSIS — Z139 Encounter for screening, unspecified: Secondary | ICD-10-CM | POA: Diagnosis not present

## 2018-03-06 DIAGNOSIS — E785 Hyperlipidemia, unspecified: Secondary | ICD-10-CM | POA: Diagnosis not present

## 2018-03-06 DIAGNOSIS — I10 Essential (primary) hypertension: Secondary | ICD-10-CM | POA: Diagnosis not present

## 2018-03-20 DIAGNOSIS — E785 Hyperlipidemia, unspecified: Secondary | ICD-10-CM | POA: Diagnosis not present

## 2018-03-20 DIAGNOSIS — I10 Essential (primary) hypertension: Secondary | ICD-10-CM | POA: Diagnosis not present

## 2018-03-20 DIAGNOSIS — J45909 Unspecified asthma, uncomplicated: Secondary | ICD-10-CM | POA: Diagnosis not present

## 2018-03-20 DIAGNOSIS — I2510 Atherosclerotic heart disease of native coronary artery without angina pectoris: Secondary | ICD-10-CM | POA: Diagnosis not present

## 2018-03-20 DIAGNOSIS — I119 Hypertensive heart disease without heart failure: Secondary | ICD-10-CM | POA: Diagnosis not present

## 2018-03-20 DIAGNOSIS — F339 Major depressive disorder, recurrent, unspecified: Secondary | ICD-10-CM | POA: Diagnosis not present

## 2018-03-22 DIAGNOSIS — J45909 Unspecified asthma, uncomplicated: Secondary | ICD-10-CM | POA: Diagnosis not present

## 2018-03-22 DIAGNOSIS — E785 Hyperlipidemia, unspecified: Secondary | ICD-10-CM | POA: Diagnosis not present

## 2018-03-22 DIAGNOSIS — I2510 Atherosclerotic heart disease of native coronary artery without angina pectoris: Secondary | ICD-10-CM | POA: Diagnosis not present

## 2018-03-22 DIAGNOSIS — T8789 Other complications of amputation stump: Secondary | ICD-10-CM | POA: Diagnosis not present

## 2018-03-22 DIAGNOSIS — I10 Essential (primary) hypertension: Secondary | ICD-10-CM | POA: Diagnosis not present

## 2018-03-22 DIAGNOSIS — S59911A Unspecified injury of right forearm, initial encounter: Secondary | ICD-10-CM | POA: Diagnosis not present

## 2018-03-22 DIAGNOSIS — I119 Hypertensive heart disease without heart failure: Secondary | ICD-10-CM | POA: Diagnosis not present

## 2018-03-22 DIAGNOSIS — W503XXA Accidental bite by another person, initial encounter: Secondary | ICD-10-CM | POA: Diagnosis not present

## 2018-03-22 DIAGNOSIS — F339 Major depressive disorder, recurrent, unspecified: Secondary | ICD-10-CM | POA: Diagnosis not present

## 2018-03-27 DIAGNOSIS — G5761 Lesion of plantar nerve, right lower limb: Secondary | ICD-10-CM | POA: Diagnosis not present

## 2018-03-27 DIAGNOSIS — M79671 Pain in right foot: Secondary | ICD-10-CM | POA: Diagnosis not present

## 2018-04-07 DIAGNOSIS — E785 Hyperlipidemia, unspecified: Secondary | ICD-10-CM | POA: Diagnosis not present

## 2018-04-07 DIAGNOSIS — J45909 Unspecified asthma, uncomplicated: Secondary | ICD-10-CM | POA: Diagnosis not present

## 2018-04-07 DIAGNOSIS — I10 Essential (primary) hypertension: Secondary | ICD-10-CM | POA: Diagnosis not present

## 2018-04-07 DIAGNOSIS — I119 Hypertensive heart disease without heart failure: Secondary | ICD-10-CM | POA: Diagnosis not present

## 2018-04-07 DIAGNOSIS — F339 Major depressive disorder, recurrent, unspecified: Secondary | ICD-10-CM | POA: Diagnosis not present

## 2018-04-07 DIAGNOSIS — R7303 Prediabetes: Secondary | ICD-10-CM | POA: Diagnosis not present

## 2018-04-07 DIAGNOSIS — I2510 Atherosclerotic heart disease of native coronary artery without angina pectoris: Secondary | ICD-10-CM | POA: Diagnosis not present

## 2018-04-25 DIAGNOSIS — M79671 Pain in right foot: Secondary | ICD-10-CM | POA: Diagnosis not present

## 2018-04-25 DIAGNOSIS — G5761 Lesion of plantar nerve, right lower limb: Secondary | ICD-10-CM | POA: Diagnosis not present

## 2018-05-13 ENCOUNTER — Emergency Department (HOSPITAL_COMMUNITY)
Admission: EM | Admit: 2018-05-13 | Discharge: 2018-05-14 | Disposition: A | Discharge status: Home / Self Care | Payer: Medicare Other | Attending: Emergency Medicine | Admitting: Emergency Medicine

## 2018-05-13 DIAGNOSIS — M25511 Pain in right shoulder: Secondary | ICD-10-CM | POA: Diagnosis not present

## 2018-05-13 DIAGNOSIS — T07XXXA Unspecified multiple injuries, initial encounter: Secondary | ICD-10-CM

## 2018-05-13 DIAGNOSIS — Z23 Encounter for immunization: Secondary | ICD-10-CM | POA: Diagnosis not present

## 2018-05-13 DIAGNOSIS — Y939 Activity, unspecified: Secondary | ICD-10-CM | POA: Insufficient documentation

## 2018-05-13 DIAGNOSIS — Y929 Unspecified place or not applicable: Secondary | ICD-10-CM | POA: Insufficient documentation

## 2018-05-13 DIAGNOSIS — S299XXA Unspecified injury of thorax, initial encounter: Secondary | ICD-10-CM | POA: Diagnosis not present

## 2018-05-13 DIAGNOSIS — M542 Cervicalgia: Secondary | ICD-10-CM | POA: Diagnosis not present

## 2018-05-13 DIAGNOSIS — M25551 Pain in right hip: Secondary | ICD-10-CM | POA: Diagnosis not present

## 2018-05-13 DIAGNOSIS — S80211A Abrasion, right knee, initial encounter: Secondary | ICD-10-CM | POA: Insufficient documentation

## 2018-05-13 DIAGNOSIS — R0789 Other chest pain: Secondary | ICD-10-CM | POA: Diagnosis not present

## 2018-05-13 DIAGNOSIS — Y999 Unspecified external cause status: Secondary | ICD-10-CM | POA: Diagnosis not present

## 2018-05-13 DIAGNOSIS — W172XXA Fall into hole, initial encounter: Secondary | ICD-10-CM | POA: Diagnosis not present

## 2018-05-13 DIAGNOSIS — S79911A Unspecified injury of right hip, initial encounter: Secondary | ICD-10-CM | POA: Diagnosis not present

## 2018-05-13 DIAGNOSIS — S7001XA Contusion of right hip, initial encounter: Secondary | ICD-10-CM | POA: Diagnosis not present

## 2018-05-13 DIAGNOSIS — Z043 Encounter for examination and observation following other accident: Secondary | ICD-10-CM | POA: Diagnosis present

## 2018-05-13 DIAGNOSIS — R0781 Pleurodynia: Secondary | ICD-10-CM | POA: Diagnosis not present

## 2018-05-13 DIAGNOSIS — W010XXA Fall on same level from slipping, tripping and stumbling without subsequent striking against object, initial encounter: Secondary | ICD-10-CM

## 2018-05-13 DIAGNOSIS — S20211A Contusion of right front wall of thorax, initial encounter: Secondary | ICD-10-CM | POA: Diagnosis not present

## 2018-05-13 DIAGNOSIS — I10 Essential (primary) hypertension: Secondary | ICD-10-CM | POA: Diagnosis not present

## 2018-05-13 DIAGNOSIS — S40011A Contusion of right shoulder, initial encounter: Secondary | ICD-10-CM | POA: Diagnosis not present

## 2018-05-13 DIAGNOSIS — S4991XA Unspecified injury of right shoulder and upper arm, initial encounter: Secondary | ICD-10-CM | POA: Diagnosis not present

## 2018-05-13 DIAGNOSIS — R0902 Hypoxemia: Secondary | ICD-10-CM | POA: Diagnosis not present

## 2018-05-13 DIAGNOSIS — S199XXA Unspecified injury of neck, initial encounter: Secondary | ICD-10-CM | POA: Diagnosis not present

## 2018-05-13 NOTE — ED Notes (Signed)
Bed: [id number] Expected date:  Expected time:  Means of arrival:  Comments: 52 yo F/ Fall

## 2018-05-13 NOTE — ED Triage Notes (Signed)
Pt brought by [facility] due to a fall. Pt stated that she tripped and landed on her right side, denies LOC. Per EMS, pt c/o pain all over her right side 10/10. Per EMS, no signs of obvious deformity.   Pt has received 150 mcg of Fentanyl

## 2018-05-14 ENCOUNTER — Emergency Department (HOSPITAL_COMMUNITY): Payer: Medicare Other

## 2018-05-14 ENCOUNTER — Encounter (HOSPITAL_COMMUNITY): Payer: Self-pay

## 2018-05-14 ENCOUNTER — Other Ambulatory Visit: Payer: Self-pay

## 2018-05-14 DIAGNOSIS — M25551 Pain in right hip: Secondary | ICD-10-CM | POA: Diagnosis not present

## 2018-05-14 DIAGNOSIS — S79911A Unspecified injury of right hip, initial encounter: Secondary | ICD-10-CM | POA: Diagnosis not present

## 2018-05-14 DIAGNOSIS — R0781 Pleurodynia: Secondary | ICD-10-CM | POA: Diagnosis not present

## 2018-05-14 DIAGNOSIS — S80211A Abrasion, right knee, initial encounter: Secondary | ICD-10-CM | POA: Diagnosis not present

## 2018-05-14 DIAGNOSIS — M25511 Pain in right shoulder: Secondary | ICD-10-CM | POA: Diagnosis not present

## 2018-05-14 DIAGNOSIS — S199XXA Unspecified injury of neck, initial encounter: Secondary | ICD-10-CM | POA: Diagnosis not present

## 2018-05-14 DIAGNOSIS — S299XXA Unspecified injury of thorax, initial encounter: Secondary | ICD-10-CM | POA: Diagnosis not present

## 2018-05-14 DIAGNOSIS — S4991XA Unspecified injury of right shoulder and upper arm, initial encounter: Secondary | ICD-10-CM | POA: Diagnosis not present

## 2018-05-14 DIAGNOSIS — M542 Cervicalgia: Secondary | ICD-10-CM | POA: Diagnosis not present

## 2018-05-14 MED ORDER — TETANUS-DIPHTH-ACELL PERTUSSIS 5-2.5-18.5 LF-MCG/0.5 IM SUSP
0.5000 mL | Freq: Once | INTRAMUSCULAR | Status: AC
  Administered 2018-05-14: 0.5 mL via INTRAMUSCULAR
  Filled 2018-05-14: qty 0.5

## 2018-05-14 MED ORDER — FENTANYL CITRATE (PF) 100 MCG/2ML IJ SOLN
50.0000 ug | Freq: Once | INTRAMUSCULAR | Status: AC
  Administered 2018-05-14: 50 ug via INTRAVENOUS
  Filled 2018-05-14: qty 2

## 2018-05-14 MED ORDER — HYDROCODONE-ACETAMINOPHEN 5-325 MG PO TABS: 1.0000 | ORAL_TABLET | Freq: Four times a day (QID) | ORAL | 0 refills | Status: DC | PRN

## 2018-05-14 MED ORDER — FENTANYL CITRATE (PF) 100 MCG/2ML IJ SOLN
100.0000 ug | Freq: Once | INTRAMUSCULAR | Status: AC
  Administered 2018-05-14: 100 ug via INTRAVENOUS
  Filled 2018-05-14: qty 2

## 2018-05-14 MED ORDER — FENTANYL CITRATE (PF) 2500 MCG/50ML IJ SOLN: 50.0000 ug/h | Freq: Once | INTRAMUSCULAR | Status: DC

## 2018-05-14 NOTE — ED Provider Notes (Signed)
[facility] Provider Note: Lowella Dell, MD, FACEP  CSN: [id number] MRN: [id number] ARRIVAL: 05/13/18 at 2343 ROOM: WA16/WA16   CHIEF COMPLAINT  Fall   HISTORY OF PRESENT ILLNESS  05/14/18 12:09 AM Jo Allen is a 52 y.o. female who tripped in a pothole and fell just prior to arrival.  She reports pain on her entire right side.  She rated her pain as a 10 out of 10 when EMS arrived.  She was given 150 mcg of fentanyl with improvement to a 7 out of 10.  She is complaining of pain in the right side of her neck, her right shoulder, her right ribs, and her right hip.  She also has an abrasion to her anterior right knee.  Pain is worse with movement or palpation.  She did not lose consciousness.  EMS reports no obvious deformities.  She is not sure of her tetanus status.   Past Medical History:  Diagnosis Date  . Asthma   . CAD (coronary artery disease)   . GERD (gastroesophageal reflux disease)   . Heart disease   . Hypertension   . Obesity   . Paresthesia 08/20/2015  . Prediabetes   . Transfusion history    55yrs ago    Past Surgical History:  Procedure Laterality Date  . ANGIOPLASTY    . BREAST BIOPSY Left   . BREAST BIOPSY WITH SENTINEL LYMPH NODE BIOPSY AND NEEDLE LOCALIZATION Left   . COLONOSCOPY WITH PROPOFOL N/A 02/19/2014   Procedure: COLONOSCOPY WITH PROPOFOL;  Surgeon: Charolett Bumpers, MD;  Location: [facility];  Service: Endoscopy;  Laterality: N/A;  . JOINT REPLACEMENT Bilateral   . TOTAL KNEE ARTHROPLASTY Bilateral     Family History  Problem Relation Age of Onset  . Hypertension Mother   . Diabetes Mother   . Stroke Father   . Depression Brother   . Breast cancer Neg Hx     Social History   Tobacco Use  . Smoking status: Never Smoker  . Smokeless tobacco: Never Used  Substance Use Topics  . Alcohol use: No  . Drug use: No    Prior to Admission medications   Medication Sig Start Date End Date Taking? Authorizing Provider    atorvastatin (LIPITOR) 10 MG tablet Take 10 mg by mouth every morning.    [provider]  buPROPion (WELLBUTRIN XL) 300 MG 24 hr tablet Take 300 mg by mouth daily. 02/13/16   [provider]  CRANBERRY PO Take 1 tablet by mouth daily.    [provider]  diazepam (VALIUM) 5 MG tablet Take 1 tablet (5 mg total) by mouth every 6 (six) hours as needed for anxiety or muscle spasms. Patient not taking: Reported on 10/27/2017 11/20/14   Trixie Dredge, PA-C  DULoxetine (CYMBALTA) 30 MG capsule 1 tablet daily for 2 weeks, then take 1 twice daily Patient not taking: Reported on 10/27/2017 12/15/15   York Spaniel, MD  gabapentin (NEURONTIN) 300 MG capsule Take 300 mg by mouth 3 (three) times daily.    [provider]  hydrochlorothiazide (HYDRODIURIL) 25 MG tablet Take 25 mg by mouth daily.    [provider]  lamoTRIgine (LAMICTAL) 100 MG tablet Take 100 mg by mouth daily.    [provider]  LYRICA 50 MG capsule Take 50 mg by mouth 2 (two) times daily. 01/12/16   [provider]  meloxicam (MOBIC) 15 MG tablet Take 15 mg by mouth daily.    [provider]  metoCLOPramide (REGLAN) 10 MG tablet Take 1 tablet (10 mg total) by mouth every 6 (six) hours as needed for nausea (or headache). Patient not taking: Reported on 10/27/2017 02/27/16   Dione BoozeGlick, David, MD  mirtazapine (REMERON) 15 MG tablet Take 15 mg by mouth daily as needed (sleep).     [provider]  naproxen (NAPROSYN) 500 MG tablet Take 1 tablet (500 mg total) by mouth 2 (two) times daily. Patient not taking: Reported on 10/27/2017 10/06/16   Liberty HandyGibbons, Claudia J, PA-C  ondansetron (ZOFRAN) 4 MG tablet Take 1 tablet (4 mg total) by mouth every 8 (eight) hours as needed for nausea or vomiting. 10/27/17   Linus MakoBurky, Natalie B, NP  phentermine 37.5 MG capsule Take 37.5 mg by mouth every morning.    [provider]  QUEtiapine (SEROQUEL) 50 MG tablet Take 50 mg by mouth at  bedtime.    [provider]  tiZANidine (ZANAFLEX) 4 MG tablet Take 4 mg by mouth every 8 (eight) hours as needed for muscle spasms.    [provider]  verapamil (VERELAN PM) 360 MG 24 hr capsule Take 360 mg by mouth every morning.    [provider]  vitamin B-12 (CYANOCOBALAMIN) 1000 MCG tablet Take 1,000 mcg by mouth daily.    [provider]  vitamin E 100 UNIT capsule Take 300 Units by mouth daily.     [provider]    Allergies Morphine and related; Penicillins; and Tramadol   REVIEW OF SYSTEMS  Negative except as noted here or in the History of Present Illness.   PHYSICAL EXAMINATION  Initial Vital Signs Blood pressure (!) 142/86, pulse 70, resp. rate 18, SpO2 96 %.  Examination General: Well-developed, well-nourished female in no acute distress; appearance consistent with age of record HENT: normocephalic; atraumatic Eyes: pupils equal, round and reactive to light; extraocular muscles intact Neck: Immobilized in cervical collar; right-sided tenderness Heart: regular rate and rhythm Lungs: clear to auscultation bilaterally Chest: Right lateral, lower rib tenderness without deformity or crepitus Abdomen: soft; nondistended; nontender; bowel sounds present Extremities: No deformity; tenderness on palpation and passive movement of right shoulder and right knee; pulses normal Neurologic: Awake, alert and oriented; motor function intact in all extremities and symmetric; no facial droop Skin: Warm and dry; superficial abrasion anterior right knee Psychiatric: Normal mood and affect   RESULTS  Summary of this visit's results, reviewed by myself:   EKG Interpretation  Date/Time:    Ventricular Rate:    PR Interval:    QRS Duration:   QT Interval:    QTC Calculation:   R Axis:     Text Interpretation:        Laboratory Studies: No results found for this or any previous visit (from the past 24 hour(s)). Imaging  Studies: Dg Ribs Unilateral W/chest Right  Result Date: 05/14/2018 CLINICAL DATA:  Fall with rib pain EXAM: RIGHT RIBS AND CHEST - 3+ VIEW COMPARISON:  11/20/2014 FINDINGS: Single-view chest demonstrates no acute airspace disease or effusion. Normal heart size. No pneumothorax. Right rib series demonstrates no acute displaced right rib fracture. IMPRESSION: Negative. Electronically Signed   By: Jasmine PangKim  Fujinaga M.D.   On: 05/14/2018 02:28   Dg Cervical Spine Complete  Result Date: 05/14/2018 CLINICAL DATA:  Fall with neck pain EXAM: CERVICAL SPINE - COMPLETE 4+ VIEW COMPARISON:  None. FINDINGS: Inadequate visualization of C7 and below. Bulky anterior osteophytes C3 through C6. Disc spaces are within normal limits. Prevertebral soft tissue thickness normal. Dens  and lateral masses are unremarkable IMPRESSION: Inadequate evaluation of C7 and below. No radiographic evidence for acute osseous abnormality of the visible upper cervical spine. CT cervical spine could be obtained to more thoroughly evaluate the cervical spine. Electronically Signed   By: Jasmine Pang M.D.   On: 05/14/2018 02:27   Dg Shoulder Right  Result Date: 05/14/2018 CLINICAL DATA:  Fall with shoulder pain EXAM: RIGHT SHOULDER - 2+ VIEW COMPARISON:  None. FINDINGS: There is no evidence of fracture or dislocation. There is no evidence of arthropathy or other focal bone abnormality. Soft tissues are unremarkable. IMPRESSION: Negative. Electronically Signed   By: Jasmine Pang M.D.   On: 05/14/2018 02:27   Ct Cervical Spine Wo Contrast  Result Date: 05/14/2018 CLINICAL DATA:  Fall with pain EXAM: CT CERVICAL SPINE WITHOUT CONTRAST TECHNIQUE: Multidetector CT imaging of the cervical spine was performed without intravenous contrast. Multiplanar CT image reconstructions were also generated. COMPARISON:  Radiograph 05/14/2018 FINDINGS: Alignment: Straightening of the cervical spine. No subluxation. Facet alignment within normal limits Skull base  and vertebrae: No acute fracture. No primary bone lesion or focal pathologic process. Soft tissues and spinal canal: No prevertebral fluid or swelling. No visible canal hematoma. Disc levels: Disc spaces are maintained. Bulky anterior osteophytes C3 through C6. Upper chest: Negative. Other: None IMPRESSION: Straightening of the cervical spine.  No acute osseous abnormality. Electronically Signed   By: Jasmine Pang M.D.   On: 05/14/2018 04:03   Dg Hip Unilat W Or Wo Pelvis 2-3 Views Right  Result Date: 05/14/2018 CLINICAL DATA:  Fall with right hip pain EXAM: DG HIP (WITH OR WITHOUT PELVIS) 2-3V RIGHT COMPARISON:  None. FINDINGS: SI joints show mild degenerative change but no widening. The pubic symphysis and rami are intact. The femoral heads both project in joint. No fracture or malalignment. Minimal degenerative change. Calcified phleboliths in the pelvis. IMPRESSION: No acute osseous abnormality Electronically Signed   By: Jasmine Pang M.D.   On: 05/14/2018 01:15    ED COURSE and MDM  Nursing notes and initial vitals signs, including pulse oximetry, reviewed.  Vitals:   05/13/18 2347 05/14/18 0005 05/14/18 0010  BP:  (!) 142/86   Pulse:  70   Resp:  18   SpO2: 96% 96%   Weight:   95.3 kg (210 lb)  Height:   5\' 4"  (1.626 m)   Consultation with the [place] state controlled substances database reveals the patient has received 1 prescription for oxycodone and 1 prescription for Tylenol 3 in the past 2 years.  PROCEDURES    ED DIAGNOSES     ICD-10-CM   1. Fall from slip, trip, or stumble, initial encounter W01.0XXA   2. Contusion, multiple sites T07.Neldon Newport, MD 05/14/18 [phone]

## 2018-05-14 NOTE — ED Notes (Signed)
Patient transported to X-ray 

## 2018-05-17 DIAGNOSIS — I2510 Atherosclerotic heart disease of native coronary artery without angina pectoris: Secondary | ICD-10-CM | POA: Diagnosis not present

## 2018-05-17 DIAGNOSIS — E785 Hyperlipidemia, unspecified: Secondary | ICD-10-CM | POA: Diagnosis not present

## 2018-05-17 DIAGNOSIS — R7303 Prediabetes: Secondary | ICD-10-CM | POA: Diagnosis not present

## 2018-05-17 DIAGNOSIS — S8991XS Unspecified injury of right lower leg, sequela: Secondary | ICD-10-CM | POA: Diagnosis not present

## 2018-05-17 DIAGNOSIS — I10 Essential (primary) hypertension: Secondary | ICD-10-CM | POA: Diagnosis not present

## 2018-05-17 DIAGNOSIS — J45909 Unspecified asthma, uncomplicated: Secondary | ICD-10-CM | POA: Diagnosis not present

## 2018-05-17 DIAGNOSIS — F339 Major depressive disorder, recurrent, unspecified: Secondary | ICD-10-CM | POA: Diagnosis not present

## 2018-05-17 DIAGNOSIS — I119 Hypertensive heart disease without heart failure: Secondary | ICD-10-CM | POA: Diagnosis not present

## 2018-06-27 DIAGNOSIS — F339 Major depressive disorder, recurrent, unspecified: Secondary | ICD-10-CM | POA: Diagnosis not present

## 2018-06-27 DIAGNOSIS — M25511 Pain in right shoulder: Secondary | ICD-10-CM | POA: Diagnosis not present

## 2018-06-27 DIAGNOSIS — I10 Essential (primary) hypertension: Secondary | ICD-10-CM | POA: Diagnosis not present

## 2018-06-27 DIAGNOSIS — I2510 Atherosclerotic heart disease of native coronary artery without angina pectoris: Secondary | ICD-10-CM | POA: Diagnosis not present

## 2018-06-27 DIAGNOSIS — S8991XS Unspecified injury of right lower leg, sequela: Secondary | ICD-10-CM | POA: Diagnosis not present

## 2018-06-27 DIAGNOSIS — E785 Hyperlipidemia, unspecified: Secondary | ICD-10-CM | POA: Diagnosis not present

## 2018-06-27 DIAGNOSIS — R7303 Prediabetes: Secondary | ICD-10-CM | POA: Diagnosis not present

## 2018-06-27 DIAGNOSIS — J45909 Unspecified asthma, uncomplicated: Secondary | ICD-10-CM | POA: Diagnosis not present

## 2018-06-27 DIAGNOSIS — I119 Hypertensive heart disease without heart failure: Secondary | ICD-10-CM | POA: Diagnosis not present

## 2018-07-03 DIAGNOSIS — M25571 Pain in right ankle and joints of right foot: Secondary | ICD-10-CM | POA: Diagnosis not present

## 2018-07-03 DIAGNOSIS — M546 Pain in thoracic spine: Secondary | ICD-10-CM | POA: Diagnosis not present

## 2018-07-03 DIAGNOSIS — Z6837 Body mass index (BMI) 37.0-37.9, adult: Secondary | ICD-10-CM | POA: Diagnosis not present

## 2018-07-03 DIAGNOSIS — M25562 Pain in left knee: Secondary | ICD-10-CM | POA: Diagnosis not present

## 2018-07-03 DIAGNOSIS — M25561 Pain in right knee: Secondary | ICD-10-CM | POA: Diagnosis not present

## 2018-07-31 DIAGNOSIS — M25561 Pain in right knee: Secondary | ICD-10-CM | POA: Diagnosis not present

## 2018-07-31 DIAGNOSIS — M5431 Sciatica, right side: Secondary | ICD-10-CM | POA: Diagnosis not present

## 2018-07-31 DIAGNOSIS — M25562 Pain in left knee: Secondary | ICD-10-CM | POA: Diagnosis not present

## 2018-08-07 DIAGNOSIS — M545 Low back pain: Secondary | ICD-10-CM | POA: Diagnosis not present

## 2018-08-07 DIAGNOSIS — M5416 Radiculopathy, lumbar region: Secondary | ICD-10-CM | POA: Diagnosis not present

## 2018-08-09 DIAGNOSIS — Z23 Encounter for immunization: Secondary | ICD-10-CM | POA: Diagnosis not present

## 2018-08-09 DIAGNOSIS — I10 Essential (primary) hypertension: Secondary | ICD-10-CM | POA: Diagnosis not present

## 2018-08-09 DIAGNOSIS — R7303 Prediabetes: Secondary | ICD-10-CM | POA: Diagnosis not present

## 2018-08-09 DIAGNOSIS — I119 Hypertensive heart disease without heart failure: Secondary | ICD-10-CM | POA: Diagnosis not present

## 2018-08-09 DIAGNOSIS — I2510 Atherosclerotic heart disease of native coronary artery without angina pectoris: Secondary | ICD-10-CM | POA: Diagnosis not present

## 2018-08-09 DIAGNOSIS — J45909 Unspecified asthma, uncomplicated: Secondary | ICD-10-CM | POA: Diagnosis not present

## 2018-08-09 DIAGNOSIS — E785 Hyperlipidemia, unspecified: Secondary | ICD-10-CM | POA: Diagnosis not present

## 2018-08-09 DIAGNOSIS — F339 Major depressive disorder, recurrent, unspecified: Secondary | ICD-10-CM | POA: Diagnosis not present

## 2018-08-10 DIAGNOSIS — M545 Low back pain: Secondary | ICD-10-CM | POA: Diagnosis not present

## 2018-08-10 DIAGNOSIS — M5416 Radiculopathy, lumbar region: Secondary | ICD-10-CM | POA: Diagnosis not present

## 2018-08-15 DIAGNOSIS — M545 Low back pain: Secondary | ICD-10-CM | POA: Diagnosis not present

## 2018-08-15 DIAGNOSIS — M5416 Radiculopathy, lumbar region: Secondary | ICD-10-CM | POA: Diagnosis not present

## 2018-08-16 DIAGNOSIS — F319 Bipolar disorder, unspecified: Secondary | ICD-10-CM | POA: Diagnosis not present

## 2018-08-16 DIAGNOSIS — F410 Panic disorder [episodic paroxysmal anxiety] without agoraphobia: Secondary | ICD-10-CM | POA: Diagnosis not present

## 2018-08-18 DIAGNOSIS — M545 Low back pain: Secondary | ICD-10-CM | POA: Diagnosis not present

## 2018-08-18 DIAGNOSIS — M5416 Radiculopathy, lumbar region: Secondary | ICD-10-CM | POA: Diagnosis not present

## 2018-08-31 DIAGNOSIS — M545 Low back pain: Secondary | ICD-10-CM | POA: Diagnosis not present

## 2018-08-31 DIAGNOSIS — M5416 Radiculopathy, lumbar region: Secondary | ICD-10-CM | POA: Diagnosis not present

## 2018-09-07 DIAGNOSIS — M5416 Radiculopathy, lumbar region: Secondary | ICD-10-CM | POA: Diagnosis not present

## 2018-09-07 DIAGNOSIS — M545 Low back pain: Secondary | ICD-10-CM | POA: Diagnosis not present

## 2018-09-11 DIAGNOSIS — M19071 Primary osteoarthritis, right ankle and foot: Secondary | ICD-10-CM | POA: Diagnosis not present

## 2018-09-13 DIAGNOSIS — I119 Hypertensive heart disease without heart failure: Secondary | ICD-10-CM | POA: Diagnosis not present

## 2018-09-13 DIAGNOSIS — M545 Low back pain: Secondary | ICD-10-CM | POA: Diagnosis not present

## 2018-09-13 DIAGNOSIS — I10 Essential (primary) hypertension: Secondary | ICD-10-CM | POA: Diagnosis not present

## 2018-09-13 DIAGNOSIS — R7303 Prediabetes: Secondary | ICD-10-CM | POA: Diagnosis not present

## 2018-09-13 DIAGNOSIS — F339 Major depressive disorder, recurrent, unspecified: Secondary | ICD-10-CM | POA: Diagnosis not present

## 2018-09-13 DIAGNOSIS — J45909 Unspecified asthma, uncomplicated: Secondary | ICD-10-CM | POA: Diagnosis not present

## 2018-09-13 DIAGNOSIS — Z0000 Encounter for general adult medical examination without abnormal findings: Secondary | ICD-10-CM | POA: Diagnosis not present

## 2018-09-13 DIAGNOSIS — M5416 Radiculopathy, lumbar region: Secondary | ICD-10-CM | POA: Diagnosis not present

## 2018-09-13 DIAGNOSIS — M5431 Sciatica, right side: Secondary | ICD-10-CM | POA: Diagnosis not present

## 2018-09-13 DIAGNOSIS — I2510 Atherosclerotic heart disease of native coronary artery without angina pectoris: Secondary | ICD-10-CM | POA: Diagnosis not present

## 2018-09-13 DIAGNOSIS — E785 Hyperlipidemia, unspecified: Secondary | ICD-10-CM | POA: Diagnosis not present

## 2018-09-18 DIAGNOSIS — M19071 Primary osteoarthritis, right ankle and foot: Secondary | ICD-10-CM | POA: Diagnosis not present

## 2018-09-21 ENCOUNTER — Other Ambulatory Visit: Payer: Self-pay | Admitting: Orthopedic Surgery

## 2018-09-21 DIAGNOSIS — M545 Low back pain, unspecified: Secondary | ICD-10-CM

## 2018-10-01 ENCOUNTER — Ambulatory Visit
Admission: RE | Admit: 2018-10-01 | Discharge: 2018-10-01 | Disposition: A | Discharge status: Home / Self Care | Payer: Medicare Other | Source: Ambulatory Visit | Attending: Orthopedic Surgery | Admitting: Orthopedic Surgery

## 2018-10-01 DIAGNOSIS — M545 Low back pain, unspecified: Secondary | ICD-10-CM

## 2018-10-01 DIAGNOSIS — M5116 Intervertebral disc disorders with radiculopathy, lumbar region: Secondary | ICD-10-CM | POA: Diagnosis not present

## 2018-10-02 DIAGNOSIS — M546 Pain in thoracic spine: Secondary | ICD-10-CM | POA: Diagnosis not present

## 2018-10-02 DIAGNOSIS — M545 Low back pain: Secondary | ICD-10-CM | POA: Diagnosis not present

## 2018-10-03 ENCOUNTER — Other Ambulatory Visit: Payer: Self-pay | Admitting: Physical Medicine and Rehabilitation

## 2018-10-03 DIAGNOSIS — M546 Pain in thoracic spine: Secondary | ICD-10-CM

## 2018-10-12 ENCOUNTER — Ambulatory Visit (HOSPITAL_COMMUNITY)
Admission: EM | Admit: 2018-10-12 | Discharge: 2018-10-12 | Disposition: A | Discharge status: Home / Self Care | Payer: Medicare Other | Attending: Family Medicine | Admitting: Family Medicine

## 2018-10-12 ENCOUNTER — Encounter (HOSPITAL_COMMUNITY): Payer: Self-pay

## 2018-10-12 DIAGNOSIS — J22 Unspecified acute lower respiratory infection: Secondary | ICD-10-CM | POA: Insufficient documentation

## 2018-10-12 DIAGNOSIS — J4521 Mild intermittent asthma with (acute) exacerbation: Secondary | ICD-10-CM

## 2018-10-12 MED ORDER — BENZONATATE 200 MG PO CAPS: 200.0000 mg | ORAL_CAPSULE | Freq: Two times a day (BID) | ORAL | 0 refills | Status: DC | PRN

## 2018-10-12 MED ORDER — AZITHROMYCIN 250 MG PO TABS: ORAL_TABLET | ORAL | 0 refills | Status: DC

## 2018-10-12 MED ORDER — PREDNISONE 20 MG PO TABS: 20.0000 mg | ORAL_TABLET | Freq: Two times a day (BID) | ORAL | 0 refills | Status: DC

## 2018-10-12 MED ORDER — ALBUTEROL SULFATE HFA 108 (90 BASE) MCG/ACT IN AERS: 1.0000 | INHALATION_SPRAY | Freq: Four times a day (QID) | RESPIRATORY_TRACT | 0 refills | Status: DC | PRN

## 2018-10-12 NOTE — ED Provider Notes (Signed)
[facility]    CSN: [id number] Arrival date & time: 10/12/18  1620     History   Chief Complaint Chief Complaint  Patient presents with  . URI    HPI Jo Allen is a 52 y.o. female.   HPI  history of asthma Has a chest cold for over a week Is having more SOB and wheezing, deep cough and now green sputum.  Started with fever and body aches yesterday. Did have a flu shot She does have "sinus" drainage and PND.  Face feels full when leans over.  Some headache. No NVD No known exposure to illness   Past Medical History:  Diagnosis Date  . Asthma   . CAD (coronary artery disease)   . GERD (gastroesophageal reflux disease)   . Heart disease   . Hypertension   . Obesity   . Paresthesia 08/20/2015  . Prediabetes   . Transfusion history    252yrs ago    Patient Active Problem List   Diagnosis Date Noted  . Paresthesia 08/20/2015    Past Surgical History:  Procedure Laterality Date  . ANGIOPLASTY    . BREAST BIOPSY Left   . BREAST BIOPSY WITH SENTINEL LYMPH NODE BIOPSY AND NEEDLE LOCALIZATION Left   . COLONOSCOPY WITH PROPOFOL N/A 02/19/2014   Procedure: COLONOSCOPY WITH PROPOFOL;  Surgeon: Charolett BumpersMartin K Johnson, MD;  Location: [facility];  Service: Endoscopy;  Laterality: N/A;  . JOINT REPLACEMENT Bilateral   . TOTAL KNEE ARTHROPLASTY Bilateral     OB History   No obstetric history on file.      Home Medications    Prior to Admission medications   Medication Sig Start Date End Date Taking? Authorizing Provider  albuterol (PROVENTIL HFA;VENTOLIN HFA) 108 (90 Base) MCG/ACT inhaler Inhale 1-2 puffs into the lungs every 6 (six) hours as needed for wheezing or shortness of breath. 10/12/18   Eustace MooreNelson, Sameria Morss Sue, MD  atorvastatin (LIPITOR) 10 MG tablet Take 10 mg by mouth every morning.    [provider]  azithromycin (ZITHROMAX Z-PAK) 250 MG tablet Take two pills today followed by one a day until gone 10/12/18   Eustace MooreNelson, Jacci Ruberg Sue, MD    benzonatate (TESSALON) 200 MG capsule Take 1 capsule (200 mg total) by mouth 2 (two) times daily as needed for cough. 10/12/18   Eustace MooreNelson, Misha Antonini Sue, MD  buPROPion (WELLBUTRIN XL) 300 MG 24 hr tablet Take 300 mg by mouth daily. 02/13/16   [provider]  CRANBERRY PO Take 1 tablet by mouth daily.    [provider]  gabapentin (NEURONTIN) 300 MG capsule Take 300 mg by mouth 3 (three) times daily.    [provider]  hydrochlorothiazide (HYDRODIURIL) 25 MG tablet Take 25 mg by mouth daily.    [provider]  HYDROcodone-acetaminophen (NORCO) 5-325 MG tablet Take 1 tablet by mouth every 6 (six) hours as needed (for pain). 05/14/18   Molpus, John, MD  lamoTRIgine (LAMICTAL) 100 MG tablet Take 100 mg by mouth daily.    [provider]  LYRICA 50 MG capsule Take 50 mg by mouth 2 (two) times daily. 01/12/16   [provider]  ondansetron (ZOFRAN) 4 MG tablet Take 1 tablet (4 mg total) by mouth every 8 (eight) hours as needed for nausea or vomiting. 10/27/17   Georgetta HaberBurky, Natalie B, NP  predniSONE (DELTASONE) 20 MG tablet Take 1 tablet (20 mg total) by mouth 2 (two) times daily with a meal. 10/12/18   Eustace MooreNelson, Sammuel Blick Sue,  MD  QUEtiapine (SEROQUEL) 50 MG tablet Take 50 mg by mouth at bedtime.    [provider]  verapamil (VERELAN PM) 360 MG 24 hr capsule Take 360 mg by mouth every morning.    [provider]  vitamin B-12 (CYANOCOBALAMIN) 1000 MCG tablet Take 1,000 mcg by mouth daily.    [provider]  vitamin E 100 UNIT capsule Take 300 Units by mouth daily.     [provider]    Family History Family History  Problem Relation Age of Onset  . Hypertension Mother   . Diabetes Mother   . Stroke Father   . Depression Brother   . Breast cancer Neg Hx     Social History Social History   Tobacco Use  . Smoking status: Never Smoker  . Smokeless tobacco: Never Used  Substance Use Topics  . Alcohol use: No  .  Drug use: No     Allergies   Morphine and related; Penicillins; and Tramadol   Review of Systems Review of Systems  Constitutional: Positive for fatigue and fever. Negative for chills.  HENT: Positive for postnasal drip, rhinorrhea and sinus pressure. Negative for ear pain and sore throat.   Eyes: Negative for pain and visual disturbance.  Respiratory: Positive for cough, shortness of breath and wheezing.   Cardiovascular: Negative for chest pain and palpitations.  Gastrointestinal: Negative for abdominal pain, nausea and vomiting.  Genitourinary: Negative for dysuria and hematuria.  Musculoskeletal: Negative for arthralgias and back pain.  Skin: Negative for color change and rash.  Neurological: Negative for seizures and syncope.  All other systems reviewed and are negative.    Physical Exam Triage Vital Signs ED Triage Vitals [10/12/18 1741]  Enc Vitals Group     BP 125/64     Pulse Rate 77     Resp 18     Temp 97.9 F (36.6 C)     Temp Source Oral     SpO2 99 %     Weight      Height      Head Circumference      Peak Flow      Pain Score 7     Pain Loc      Pain Edu?      Excl. in GC?    No data found.  Updated Vital Signs BP 125/64 (BP Location: Right Arm)   Pulse 77   Temp 97.9 F (36.6 C) (Oral)   Resp 18   SpO2 99%     Physical Exam Constitutional:      General: She is not in acute distress.    Appearance: She is well-developed. She is ill-appearing.  HENT:     Head: Normocephalic and atraumatic.     Right Ear: Tympanic membrane, ear canal and external ear normal.     Left Ear: Tympanic membrane, ear canal and external ear normal.     Nose: Congestion present.     Mouth/Throat:     Mouth: Mucous membranes are moist.  Eyes:     Conjunctiva/sclera: Conjunctivae normal.     Pupils: Pupils are equal, round, and reactive to light.  Neck:     Musculoskeletal: Normal range of motion.  Cardiovascular:     Rate and Rhythm: Normal rate and regular  rhythm.     Heart sounds: Normal heart sounds.  Pulmonary:     Effort: Pulmonary effort is normal. No respiratory distress.     Comments: Few rhonchi, end insp wheezing noted.  No rales Abdominal:     General: There is no distension.     Palpations: Abdomen is soft.  Musculoskeletal: Normal range of motion.  Lymphadenopathy:     Cervical: No cervical adenopathy.  Skin:    General: Skin is warm and dry.  Neurological:     General: No focal deficit present.     Mental Status: She is alert.  Psychiatric:        Mood and Affect: Mood normal.        Thought Content: Thought content normal.      UC Treatments / Results  Labs (all labs ordered are listed, but only abnormal results are displayed) Labs Reviewed - No data to display  EKG None  Radiology No results found.  Procedures Procedures (including critical care time)  Medications Ordered in UC Medications - No data to display  Initial Impression / Assessment and Plan / UC Course  I have reviewed the triage vital signs and the nursing notes.  Pertinent labs & imaging results that were available during my care of the patient were reviewed by me and considered in my medical decision making (see chart for details).     Discussed this may be a virus but I am concerned she is having worsening sx a week into t he illness.  Will treat the asthma exacerbation- and add a z pak Final Clinical Impressions(s) / UC Diagnoses   Final diagnoses:  Lower respiratory infection (e.g., bronchitis, pneumonia, pneumonitis, pulmonitis)  Mild intermittent asthma with acute exacerbation     Discharge Instructions     Plenty of fluids Try to get some rest Use a humidifier in your home if you have one Use the albuterol inhaler as needed for wheezing Take the antibiotic as prescribed Take prednisone twice a day for 5 days Cough medicine can be taken 2 or 3 times a day Return if worse at any time instead of better   ED  Prescriptions    Medication Sig Dispense Auth. Provider   benzonatate (TESSALON) 200 MG capsule Take 1 capsule (200 mg total) by mouth 2 (two) times daily as needed for cough. 20 capsule Eustace MooreNelson, Teigan Sahli Sue, MD   azithromycin (ZITHROMAX Z-PAK) 250 MG tablet Take two pills today followed by one a day until gone 6 tablet Eustace MooreNelson, Jarquavious Fentress Sue, MD   predniSONE (DELTASONE) 20 MG tablet Take 1 tablet (20 mg total) by mouth 2 (two) times daily with a meal. 10 tablet Eustace MooreNelson, Marianela Mandrell Sue, MD   albuterol (PROVENTIL HFA;VENTOLIN HFA) 108 (90 Base) MCG/ACT inhaler Inhale 1-2 puffs into the lungs every 6 (six) hours as needed for wheezing or shortness of breath. 1 Inhaler Eustace MooreNelson, Medard Decuir Sue, MD     Controlled Substance Prescriptions [place] Controlled Substance Registry consulted? Not Applicable   Eustace MooreNelson, Christne Platts Sue, MD 10/12/18 [phone]

## 2018-10-12 NOTE — ED Triage Notes (Signed)
Pt presents with deep persistent cough with green mucus, nasal drainage, congestion, fatigue, chills and generalized body aches.

## 2018-10-12 NOTE — Discharge Instructions (Addendum)
Plenty of fluids Try to get some rest Use a humidifier in your home if you have one Use the albuterol inhaler as needed for wheezing Take the antibiotic as prescribed Take prednisone twice a day for 5 days Cough medicine can be taken 2 or 3 times a day Return if worse at any time instead of better

## 2018-10-12 NOTE — ED Notes (Signed)
Patient able to ambulate independently  

## 2018-10-13 ENCOUNTER — Other Ambulatory Visit: Payer: Medicare Other

## 2018-10-19 ENCOUNTER — Ambulatory Visit
Admission: RE | Admit: 2018-10-19 | Discharge: 2018-10-19 | Disposition: A | Discharge status: Home / Self Care | Payer: Medicare Other | Source: Ambulatory Visit | Attending: Physical Medicine and Rehabilitation | Admitting: Physical Medicine and Rehabilitation

## 2018-10-19 DIAGNOSIS — M5114 Intervertebral disc disorders with radiculopathy, thoracic region: Secondary | ICD-10-CM | POA: Diagnosis not present

## 2018-10-19 DIAGNOSIS — M4724 Other spondylosis with radiculopathy, thoracic region: Secondary | ICD-10-CM | POA: Diagnosis not present

## 2018-10-19 DIAGNOSIS — M546 Pain in thoracic spine: Secondary | ICD-10-CM

## 2018-10-19 DIAGNOSIS — M4804 Spinal stenosis, thoracic region: Secondary | ICD-10-CM | POA: Diagnosis not present

## 2018-10-23 DIAGNOSIS — M546 Pain in thoracic spine: Secondary | ICD-10-CM | POA: Diagnosis not present

## 2018-10-23 DIAGNOSIS — M545 Low back pain: Secondary | ICD-10-CM | POA: Diagnosis not present

## 2018-11-15 DIAGNOSIS — E785 Hyperlipidemia, unspecified: Secondary | ICD-10-CM | POA: Diagnosis not present

## 2018-11-15 DIAGNOSIS — J45909 Unspecified asthma, uncomplicated: Secondary | ICD-10-CM | POA: Diagnosis not present

## 2018-11-15 DIAGNOSIS — M5431 Sciatica, right side: Secondary | ICD-10-CM | POA: Diagnosis not present

## 2018-11-15 DIAGNOSIS — F339 Major depressive disorder, recurrent, unspecified: Secondary | ICD-10-CM | POA: Diagnosis not present

## 2018-11-15 DIAGNOSIS — I10 Essential (primary) hypertension: Secondary | ICD-10-CM | POA: Diagnosis not present

## 2018-11-15 DIAGNOSIS — I2510 Atherosclerotic heart disease of native coronary artery without angina pectoris: Secondary | ICD-10-CM | POA: Diagnosis not present

## 2018-11-15 DIAGNOSIS — I119 Hypertensive heart disease without heart failure: Secondary | ICD-10-CM | POA: Diagnosis not present

## 2018-11-15 DIAGNOSIS — R7303 Prediabetes: Secondary | ICD-10-CM | POA: Diagnosis not present

## 2018-11-27 DIAGNOSIS — I119 Hypertensive heart disease without heart failure: Secondary | ICD-10-CM | POA: Diagnosis not present

## 2018-11-27 DIAGNOSIS — E782 Mixed hyperlipidemia: Secondary | ICD-10-CM | POA: Diagnosis not present

## 2018-11-27 DIAGNOSIS — J45909 Unspecified asthma, uncomplicated: Secondary | ICD-10-CM | POA: Diagnosis not present

## 2018-11-27 DIAGNOSIS — I2510 Atherosclerotic heart disease of native coronary artery without angina pectoris: Secondary | ICD-10-CM | POA: Diagnosis not present

## 2018-11-27 DIAGNOSIS — R7303 Prediabetes: Secondary | ICD-10-CM | POA: Diagnosis not present

## 2018-11-27 DIAGNOSIS — I10 Essential (primary) hypertension: Secondary | ICD-10-CM | POA: Diagnosis not present

## 2018-12-18 DIAGNOSIS — I2510 Atherosclerotic heart disease of native coronary artery without angina pectoris: Secondary | ICD-10-CM | POA: Diagnosis not present

## 2018-12-18 DIAGNOSIS — M25561 Pain in right knee: Secondary | ICD-10-CM | POA: Diagnosis not present

## 2018-12-18 DIAGNOSIS — I10 Essential (primary) hypertension: Secondary | ICD-10-CM | POA: Diagnosis not present

## 2018-12-18 DIAGNOSIS — E782 Mixed hyperlipidemia: Secondary | ICD-10-CM | POA: Diagnosis not present

## 2018-12-18 DIAGNOSIS — J45909 Unspecified asthma, uncomplicated: Secondary | ICD-10-CM | POA: Diagnosis not present

## 2018-12-18 DIAGNOSIS — R7303 Prediabetes: Secondary | ICD-10-CM | POA: Diagnosis not present

## 2018-12-18 DIAGNOSIS — I119 Hypertensive heart disease without heart failure: Secondary | ICD-10-CM | POA: Diagnosis not present

## 2018-12-21 DIAGNOSIS — F319 Bipolar disorder, unspecified: Secondary | ICD-10-CM | POA: Diagnosis not present

## 2018-12-21 DIAGNOSIS — F410 Panic disorder [episodic paroxysmal anxiety] without agoraphobia: Secondary | ICD-10-CM | POA: Diagnosis not present

## 2018-12-26 DIAGNOSIS — M546 Pain in thoracic spine: Secondary | ICD-10-CM | POA: Diagnosis not present

## 2018-12-26 DIAGNOSIS — M5414 Radiculopathy, thoracic region: Secondary | ICD-10-CM | POA: Diagnosis not present

## 2018-12-26 DIAGNOSIS — M545 Low back pain: Secondary | ICD-10-CM | POA: Diagnosis not present

## 2018-12-27 ENCOUNTER — Other Ambulatory Visit: Payer: Self-pay | Admitting: Physical Medicine and Rehabilitation

## 2018-12-27 DIAGNOSIS — M549 Dorsalgia, unspecified: Secondary | ICD-10-CM

## 2019-01-05 ENCOUNTER — Other Ambulatory Visit: Payer: Medicare Other

## 2019-01-06 ENCOUNTER — Other Ambulatory Visit: Payer: Self-pay

## 2019-01-06 ENCOUNTER — Emergency Department (HOSPITAL_COMMUNITY)
Admission: EM | Admit: 2019-01-06 | Discharge: 2019-01-06 | Disposition: A | Discharge status: Home / Self Care | Payer: Medicare Other | Attending: Emergency Medicine | Admitting: Emergency Medicine

## 2019-01-06 ENCOUNTER — Encounter (HOSPITAL_COMMUNITY): Payer: Self-pay | Admitting: *Deleted

## 2019-01-06 DIAGNOSIS — I2510 Atherosclerotic heart disease of native coronary artery without angina pectoris: Secondary | ICD-10-CM | POA: Insufficient documentation

## 2019-01-06 DIAGNOSIS — Z880 Allergy status to penicillin: Secondary | ICD-10-CM | POA: Insufficient documentation

## 2019-01-06 DIAGNOSIS — J45909 Unspecified asthma, uncomplicated: Secondary | ICD-10-CM | POA: Diagnosis not present

## 2019-01-06 DIAGNOSIS — H6123 Impacted cerumen, bilateral: Secondary | ICD-10-CM | POA: Insufficient documentation

## 2019-01-06 DIAGNOSIS — Z79899 Other long term (current) drug therapy: Secondary | ICD-10-CM | POA: Diagnosis not present

## 2019-01-06 DIAGNOSIS — J9801 Acute bronchospasm: Secondary | ICD-10-CM | POA: Insufficient documentation

## 2019-01-06 DIAGNOSIS — I10 Essential (primary) hypertension: Secondary | ICD-10-CM | POA: Insufficient documentation

## 2019-01-06 DIAGNOSIS — R05 Cough: Secondary | ICD-10-CM | POA: Diagnosis present

## 2019-01-06 NOTE — Discharge Instructions (Addendum)
Use your inhaler as needed as prescribed. Flonase and Zyrtec as needed. Recheck with your doctor as needed.

## 2019-01-06 NOTE — ED Triage Notes (Signed)
Pt moving boxes in storage and got dust in her throat, started coughing while there (approx 1700), coughing stopped and then started again 0430.  LS clear.

## 2019-01-06 NOTE — ED Provider Notes (Signed)
[facility]-EMERGENCY DEPT Provider Note   CSN: [id number] Arrival date & time: 01/06/19  8832    History   Chief Complaint Chief Complaint  Patient presents with  . Cough    HPI Jo Allen is a 53 y.o. female.     53 year old female with history of asthma, hypertension, GERD, coronary artery disease presents with complaint of cough.  Patient states that she was moving boxes yesterday when she inhaled a lot of dust and began coughing.  Patient states she was able to stop coughing however when she woke up this morning cough resumed with deep breaths and she became concerned so she came to the emergency room.  Cough has since stopped.  Patient denies fevers, chills, sick contacts, nasal congestion.     Past Medical History:  Diagnosis Date  . Asthma   . CAD (coronary artery disease)   . GERD (gastroesophageal reflux disease)   . Heart disease   . Hypertension   . Obesity   . Paresthesia 08/20/2015  . Prediabetes   . Transfusion history    51yrs ago    Patient Active Problem List   Diagnosis Date Noted  . Paresthesia 08/20/2015    Past Surgical History:  Procedure Laterality Date  . ANGIOPLASTY    . BREAST BIOPSY Left   . BREAST BIOPSY WITH SENTINEL LYMPH NODE BIOPSY AND NEEDLE LOCALIZATION Left   . COLONOSCOPY WITH PROPOFOL N/A 02/19/2014   Procedure: COLONOSCOPY WITH PROPOFOL;  Surgeon: Charolett Bumpers, MD;  Location: [facility];  Service: Endoscopy;  Laterality: N/A;  . JOINT REPLACEMENT Bilateral   . TOTAL KNEE ARTHROPLASTY Bilateral      OB History   No obstetric history on file.      Home Medications    Prior to Admission medications   Medication Sig Start Date End Date Taking? Authorizing Provider  albuterol (PROVENTIL HFA;VENTOLIN HFA) 108 (90 Base) MCG/ACT inhaler Inhale 1-2 puffs into the lungs every 6 (six) hours as needed for wheezing or shortness of breath. 10/12/18   Eustace Moore, MD  atorvastatin (LIPITOR) 10  MG tablet Take 10 mg by mouth every morning.    [provider]  azithromycin (ZITHROMAX Z-PAK) 250 MG tablet Take two pills today followed by one a day until gone 10/12/18   Eustace Moore, MD  benzonatate (TESSALON) 200 MG capsule Take 1 capsule (200 mg total) by mouth 2 (two) times daily as needed for cough. 10/12/18   Eustace Moore, MD  buPROPion (WELLBUTRIN XL) 300 MG 24 hr tablet Take 300 mg by mouth daily. 02/13/16   [provider]  CRANBERRY PO Take 1 tablet by mouth daily.    [provider]  gabapentin (NEURONTIN) 300 MG capsule Take 300 mg by mouth 3 (three) times daily.    [provider]  hydrochlorothiazide (HYDRODIURIL) 25 MG tablet Take 25 mg by mouth daily.    [provider]  HYDROcodone-acetaminophen (NORCO) 5-325 MG tablet Take 1 tablet by mouth every 6 (six) hours as needed (for pain). 05/14/18   Molpus, John, MD  lamoTRIgine (LAMICTAL) 100 MG tablet Take 100 mg by mouth daily.    [provider]  LYRICA 50 MG capsule Take 50 mg by mouth 2 (two) times daily. 01/12/16   [provider]  ondansetron (ZOFRAN) 4 MG tablet Take 1 tablet (4 mg total) by mouth every 8 (eight) hours as needed for nausea or vomiting. 10/27/17   Georgetta Haber, NP  predniSONE (  DELTASONE) 20 MG tablet Take 1 tablet (20 mg total) by mouth 2 (two) times daily with a meal. 10/12/18   Eustace Moore, MD  QUEtiapine (SEROQUEL) 50 MG tablet Take 50 mg by mouth at bedtime.    [provider]  verapamil (VERELAN PM) 360 MG 24 hr capsule Take 360 mg by mouth every morning.    [provider]  vitamin B-12 (CYANOCOBALAMIN) 1000 MCG tablet Take 1,000 mcg by mouth daily.    [provider]  vitamin E 100 UNIT capsule Take 300 Units by mouth daily.     [provider]    Family History Family History  Problem Relation Age of Onset  . Hypertension Mother   . Diabetes Mother   . Stroke Father   .  Depression Brother   . Breast cancer Neg Hx     Social History Social History   Tobacco Use  . Smoking status: Never Smoker  . Smokeless tobacco: Never Used  Substance Use Topics  . Alcohol use: No  . Drug use: No     Allergies   Morphine and related; Penicillins; and Tramadol   Review of Systems Review of Systems  Constitutional: Negative for chills and fever.  HENT: Negative for congestion, postnasal drip, rhinorrhea, sinus pressure, sinus pain, sneezing and sore throat.   Respiratory: Positive for cough. Negative for shortness of breath and wheezing.   Cardiovascular: Negative for chest pain.  Musculoskeletal: Negative for arthralgias and myalgias.  Skin: Negative for rash and wound.  Allergic/Immunologic: Negative for immunocompromised state.  Hematological: Negative for adenopathy.  All other systems reviewed and are negative.    Physical Exam Updated Vital Signs BP 122/74 (BP Location: Left Arm)   Pulse 67   Temp 97.9 F (36.6 C) (Oral)   Resp 18   Ht 5\' 5"  (1.651 m)   Wt 97.5 kg   SpO2 98%   BMI 35.78 kg/m   Physical Exam Vitals signs and nursing note reviewed.  Constitutional:      General: She is not in acute distress.    Appearance: She is well-developed. She is not diaphoretic.  HENT:     Head: Normocephalic and atraumatic.     Right Ear: There is impacted cerumen.     Left Ear: There is impacted cerumen.     Nose: Nose normal. No congestion.     Mouth/Throat:     Mouth: Mucous membranes are moist.  Neck:     Musculoskeletal: Neck supple.  Cardiovascular:     Rate and Rhythm: Normal rate and regular rhythm.     Pulses: Normal pulses.  Pulmonary:     Effort: Pulmonary effort is normal.     Breath sounds: Normal breath sounds.  Skin:    General: Skin is warm and dry.  Neurological:     Mental Status: She is alert and oriented to person, place, and time.  Psychiatric:        Behavior: Behavior normal.      ED Treatments / Results   Labs (all labs ordered are listed, but only abnormal results are displayed) Labs Reviewed - No data to display  EKG None  Radiology No results found.  Procedures Procedures (including critical care time)  Medications Ordered in ED Medications - No data to display   Initial Impression / Assessment and Plan / ED Course  I have reviewed the triage vital signs and the nursing notes.  Pertinent labs & imaging results that were available during my  care of the patient were reviewed by me and considered in my medical decision making (see chart for details).  Clinical Course as of Jan 06 627  Sat Jan 06, 2019  6630 53 year old female presents with complaint of cough which started yesterday after inhaling dust while moving boxes.  She has a history of asthma, reports episodic cough with spells which resolved.  He has not tried using her inhaler.  Patient is otherwise well-appearing without sick symptoms, lung sounds are clear.  Vitals are normal.  Recommend patient use her inhaler for likely bronchospasm, can add Flonase and Zyrtec if needed and follow-up with her PCP.   [LM]    Clinical Course User Index [LM] Jeannie Fend, PA-C   Final Clinical Impressions(s) / ED Diagnoses   Final diagnoses:  Bronchospasm    ED Discharge Orders    None       Jeannie Fend, PA-C 01/06/19 0628    Molpus, Jonny Ruiz, MD 01/06/19 [phone]

## 2019-01-24 DIAGNOSIS — F319 Bipolar disorder, unspecified: Secondary | ICD-10-CM | POA: Diagnosis not present

## 2019-01-24 DIAGNOSIS — F410 Panic disorder [episodic paroxysmal anxiety] without agoraphobia: Secondary | ICD-10-CM | POA: Diagnosis not present

## 2019-01-25 DIAGNOSIS — F410 Panic disorder [episodic paroxysmal anxiety] without agoraphobia: Secondary | ICD-10-CM | POA: Diagnosis not present

## 2019-02-06 ENCOUNTER — Other Ambulatory Visit: Payer: Medicare Other

## 2019-02-09 DIAGNOSIS — R7303 Prediabetes: Secondary | ICD-10-CM | POA: Diagnosis not present

## 2019-02-09 DIAGNOSIS — J45909 Unspecified asthma, uncomplicated: Secondary | ICD-10-CM | POA: Diagnosis not present

## 2019-02-09 DIAGNOSIS — I2510 Atherosclerotic heart disease of native coronary artery without angina pectoris: Secondary | ICD-10-CM | POA: Diagnosis not present

## 2019-02-09 DIAGNOSIS — I119 Hypertensive heart disease without heart failure: Secondary | ICD-10-CM | POA: Diagnosis not present

## 2019-02-09 DIAGNOSIS — E782 Mixed hyperlipidemia: Secondary | ICD-10-CM | POA: Diagnosis not present

## 2019-02-09 DIAGNOSIS — I10 Essential (primary) hypertension: Secondary | ICD-10-CM | POA: Diagnosis not present

## 2019-03-02 DIAGNOSIS — I119 Hypertensive heart disease without heart failure: Secondary | ICD-10-CM | POA: Diagnosis not present

## 2019-03-02 DIAGNOSIS — Z131 Encounter for screening for diabetes mellitus: Secondary | ICD-10-CM | POA: Diagnosis not present

## 2019-03-02 DIAGNOSIS — E782 Mixed hyperlipidemia: Secondary | ICD-10-CM | POA: Diagnosis not present

## 2019-03-02 DIAGNOSIS — R7303 Prediabetes: Secondary | ICD-10-CM | POA: Diagnosis not present

## 2019-03-02 DIAGNOSIS — I2510 Atherosclerotic heart disease of native coronary artery without angina pectoris: Secondary | ICD-10-CM | POA: Diagnosis not present

## 2019-03-02 DIAGNOSIS — Z0100 Encounter for examination of eyes and vision without abnormal findings: Secondary | ICD-10-CM | POA: Diagnosis not present

## 2019-03-02 DIAGNOSIS — M25561 Pain in right knee: Secondary | ICD-10-CM | POA: Diagnosis not present

## 2019-03-02 DIAGNOSIS — H538 Other visual disturbances: Secondary | ICD-10-CM | POA: Diagnosis not present

## 2019-03-02 DIAGNOSIS — J45909 Unspecified asthma, uncomplicated: Secondary | ICD-10-CM | POA: Diagnosis not present

## 2019-03-02 DIAGNOSIS — I10 Essential (primary) hypertension: Secondary | ICD-10-CM | POA: Diagnosis not present

## 2019-03-05 DIAGNOSIS — I2510 Atherosclerotic heart disease of native coronary artery without angina pectoris: Secondary | ICD-10-CM | POA: Diagnosis not present

## 2019-03-08 DIAGNOSIS — M47814 Spondylosis without myelopathy or radiculopathy, thoracic region: Secondary | ICD-10-CM | POA: Diagnosis not present

## 2019-03-08 DIAGNOSIS — M47816 Spondylosis without myelopathy or radiculopathy, lumbar region: Secondary | ICD-10-CM | POA: Diagnosis not present

## 2019-03-09 ENCOUNTER — Inpatient Hospital Stay: Admission: RE | Admit: 2019-03-09 | Payer: Medicare Other | Source: Ambulatory Visit

## 2019-03-14 DIAGNOSIS — R7303 Prediabetes: Secondary | ICD-10-CM | POA: Diagnosis not present

## 2019-03-14 DIAGNOSIS — I2510 Atherosclerotic heart disease of native coronary artery without angina pectoris: Secondary | ICD-10-CM | POA: Diagnosis not present

## 2019-03-14 DIAGNOSIS — E782 Mixed hyperlipidemia: Secondary | ICD-10-CM | POA: Diagnosis not present

## 2019-03-14 DIAGNOSIS — I10 Essential (primary) hypertension: Secondary | ICD-10-CM | POA: Diagnosis not present

## 2019-03-14 DIAGNOSIS — J45909 Unspecified asthma, uncomplicated: Secondary | ICD-10-CM | POA: Diagnosis not present

## 2019-03-14 DIAGNOSIS — I119 Hypertensive heart disease without heart failure: Secondary | ICD-10-CM | POA: Diagnosis not present

## 2019-03-14 DIAGNOSIS — M1990 Unspecified osteoarthritis, unspecified site: Secondary | ICD-10-CM | POA: Diagnosis not present

## 2019-03-15 DIAGNOSIS — F319 Bipolar disorder, unspecified: Secondary | ICD-10-CM | POA: Diagnosis not present

## 2019-03-15 DIAGNOSIS — F410 Panic disorder [episodic paroxysmal anxiety] without agoraphobia: Secondary | ICD-10-CM | POA: Diagnosis not present

## 2019-05-10 DIAGNOSIS — I119 Hypertensive heart disease without heart failure: Secondary | ICD-10-CM | POA: Diagnosis not present

## 2019-05-10 DIAGNOSIS — J45909 Unspecified asthma, uncomplicated: Secondary | ICD-10-CM | POA: Diagnosis not present

## 2019-05-10 DIAGNOSIS — I2510 Atherosclerotic heart disease of native coronary artery without angina pectoris: Secondary | ICD-10-CM | POA: Diagnosis not present

## 2019-05-10 DIAGNOSIS — R7303 Prediabetes: Secondary | ICD-10-CM | POA: Diagnosis not present

## 2019-05-10 DIAGNOSIS — E782 Mixed hyperlipidemia: Secondary | ICD-10-CM | POA: Diagnosis not present

## 2019-05-10 DIAGNOSIS — Z113 Encounter for screening for infections with a predominantly sexual mode of transmission: Secondary | ICD-10-CM | POA: Diagnosis not present

## 2019-05-10 DIAGNOSIS — M1990 Unspecified osteoarthritis, unspecified site: Secondary | ICD-10-CM | POA: Diagnosis not present

## 2019-05-10 DIAGNOSIS — I10 Essential (primary) hypertension: Secondary | ICD-10-CM | POA: Diagnosis not present

## 2019-05-11 DIAGNOSIS — M47816 Spondylosis without myelopathy or radiculopathy, lumbar region: Secondary | ICD-10-CM | POA: Diagnosis not present

## 2019-05-21 DIAGNOSIS — F410 Panic disorder [episodic paroxysmal anxiety] without agoraphobia: Secondary | ICD-10-CM | POA: Diagnosis not present

## 2019-05-21 DIAGNOSIS — F319 Bipolar disorder, unspecified: Secondary | ICD-10-CM | POA: Diagnosis not present

## 2019-05-25 DIAGNOSIS — M79641 Pain in right hand: Secondary | ICD-10-CM | POA: Diagnosis not present

## 2019-05-25 DIAGNOSIS — M79642 Pain in left hand: Secondary | ICD-10-CM | POA: Diagnosis not present

## 2019-06-04 DIAGNOSIS — G5603 Carpal tunnel syndrome, bilateral upper limbs: Secondary | ICD-10-CM | POA: Diagnosis not present

## 2019-06-08 DIAGNOSIS — G5603 Carpal tunnel syndrome, bilateral upper limbs: Secondary | ICD-10-CM | POA: Diagnosis not present

## 2019-06-08 DIAGNOSIS — G5602 Carpal tunnel syndrome, left upper limb: Secondary | ICD-10-CM | POA: Diagnosis not present

## 2019-07-10 DIAGNOSIS — M47816 Spondylosis without myelopathy or radiculopathy, lumbar region: Secondary | ICD-10-CM | POA: Diagnosis not present

## 2019-07-11 DIAGNOSIS — I2510 Atherosclerotic heart disease of native coronary artery without angina pectoris: Secondary | ICD-10-CM | POA: Diagnosis not present

## 2019-07-11 DIAGNOSIS — Z23 Encounter for immunization: Secondary | ICD-10-CM | POA: Diagnosis not present

## 2019-07-11 DIAGNOSIS — M1990 Unspecified osteoarthritis, unspecified site: Secondary | ICD-10-CM | POA: Diagnosis not present

## 2019-07-11 DIAGNOSIS — F439 Reaction to severe stress, unspecified: Secondary | ICD-10-CM | POA: Diagnosis not present

## 2019-07-11 DIAGNOSIS — I10 Essential (primary) hypertension: Secondary | ICD-10-CM | POA: Diagnosis not present

## 2019-07-11 DIAGNOSIS — R7303 Prediabetes: Secondary | ICD-10-CM | POA: Diagnosis not present

## 2019-07-11 DIAGNOSIS — I119 Hypertensive heart disease without heart failure: Secondary | ICD-10-CM | POA: Diagnosis not present

## 2019-07-11 DIAGNOSIS — E782 Mixed hyperlipidemia: Secondary | ICD-10-CM | POA: Diagnosis not present

## 2019-07-11 DIAGNOSIS — J45909 Unspecified asthma, uncomplicated: Secondary | ICD-10-CM | POA: Diagnosis not present

## 2019-07-13 DIAGNOSIS — M65342 Trigger finger, left ring finger: Secondary | ICD-10-CM | POA: Diagnosis not present

## 2019-07-13 DIAGNOSIS — G5602 Carpal tunnel syndrome, left upper limb: Secondary | ICD-10-CM | POA: Diagnosis not present

## 2019-07-13 DIAGNOSIS — G5601 Carpal tunnel syndrome, right upper limb: Secondary | ICD-10-CM | POA: Diagnosis not present

## 2019-08-09 DIAGNOSIS — G5603 Carpal tunnel syndrome, bilateral upper limbs: Secondary | ICD-10-CM | POA: Diagnosis not present

## 2019-08-09 DIAGNOSIS — M65342 Trigger finger, left ring finger: Secondary | ICD-10-CM | POA: Diagnosis not present

## 2019-08-09 DIAGNOSIS — G5602 Carpal tunnel syndrome, left upper limb: Secondary | ICD-10-CM | POA: Diagnosis not present

## 2019-08-09 DIAGNOSIS — G5601 Carpal tunnel syndrome, right upper limb: Secondary | ICD-10-CM | POA: Diagnosis not present

## 2019-08-09 DIAGNOSIS — M65332 Trigger finger, left middle finger: Secondary | ICD-10-CM | POA: Diagnosis not present

## 2019-08-17 DIAGNOSIS — M1990 Unspecified osteoarthritis, unspecified site: Secondary | ICD-10-CM | POA: Diagnosis not present

## 2019-08-17 DIAGNOSIS — E782 Mixed hyperlipidemia: Secondary | ICD-10-CM | POA: Diagnosis not present

## 2019-08-17 DIAGNOSIS — J45909 Unspecified asthma, uncomplicated: Secondary | ICD-10-CM | POA: Diagnosis not present

## 2019-08-17 DIAGNOSIS — I10 Essential (primary) hypertension: Secondary | ICD-10-CM | POA: Diagnosis not present

## 2019-08-17 DIAGNOSIS — R7303 Prediabetes: Secondary | ICD-10-CM | POA: Diagnosis not present

## 2019-08-17 DIAGNOSIS — I2510 Atherosclerotic heart disease of native coronary artery without angina pectoris: Secondary | ICD-10-CM | POA: Diagnosis not present

## 2019-08-17 DIAGNOSIS — Z131 Encounter for screening for diabetes mellitus: Secondary | ICD-10-CM | POA: Diagnosis not present

## 2019-08-17 DIAGNOSIS — I119 Hypertensive heart disease without heart failure: Secondary | ICD-10-CM | POA: Diagnosis not present

## 2019-08-17 DIAGNOSIS — F3341 Major depressive disorder, recurrent, in partial remission: Secondary | ICD-10-CM | POA: Diagnosis not present

## 2019-09-07 DIAGNOSIS — M65342 Trigger finger, left ring finger: Secondary | ICD-10-CM | POA: Diagnosis not present

## 2019-09-07 DIAGNOSIS — M65332 Trigger finger, left middle finger: Secondary | ICD-10-CM | POA: Diagnosis not present

## 2019-09-21 DIAGNOSIS — I2510 Atherosclerotic heart disease of native coronary artery without angina pectoris: Secondary | ICD-10-CM | POA: Diagnosis not present

## 2019-09-21 DIAGNOSIS — Z0001 Encounter for general adult medical examination with abnormal findings: Secondary | ICD-10-CM | POA: Diagnosis not present

## 2019-09-21 DIAGNOSIS — I10 Essential (primary) hypertension: Secondary | ICD-10-CM | POA: Diagnosis not present

## 2019-09-21 DIAGNOSIS — E782 Mixed hyperlipidemia: Secondary | ICD-10-CM | POA: Diagnosis not present

## 2019-09-21 DIAGNOSIS — F3341 Major depressive disorder, recurrent, in partial remission: Secondary | ICD-10-CM | POA: Diagnosis not present

## 2019-09-21 DIAGNOSIS — I119 Hypertensive heart disease without heart failure: Secondary | ICD-10-CM | POA: Diagnosis not present

## 2019-09-21 DIAGNOSIS — R7303 Prediabetes: Secondary | ICD-10-CM | POA: Diagnosis not present

## 2019-09-21 DIAGNOSIS — M1990 Unspecified osteoarthritis, unspecified site: Secondary | ICD-10-CM | POA: Diagnosis not present

## 2019-09-21 DIAGNOSIS — Z131 Encounter for screening for diabetes mellitus: Secondary | ICD-10-CM | POA: Diagnosis not present

## 2019-09-21 DIAGNOSIS — J45909 Unspecified asthma, uncomplicated: Secondary | ICD-10-CM | POA: Diagnosis not present

## 2019-10-24 ENCOUNTER — Other Ambulatory Visit: Payer: Self-pay

## 2019-10-24 ENCOUNTER — Emergency Department (HOSPITAL_COMMUNITY)
Admission: EM | Admit: 2019-10-24 | Discharge: 2019-10-24 | Disposition: A | Discharge status: Home / Self Care | Payer: No Typology Code available for payment source | Attending: Emergency Medicine | Admitting: Emergency Medicine

## 2019-10-24 DIAGNOSIS — J45909 Unspecified asthma, uncomplicated: Secondary | ICD-10-CM | POA: Diagnosis not present

## 2019-10-24 DIAGNOSIS — Y93I9 Activity, other involving external motion: Secondary | ICD-10-CM | POA: Insufficient documentation

## 2019-10-24 DIAGNOSIS — M542 Cervicalgia: Secondary | ICD-10-CM | POA: Diagnosis present

## 2019-10-24 DIAGNOSIS — I2510 Atherosclerotic heart disease of native coronary artery without angina pectoris: Secondary | ICD-10-CM | POA: Diagnosis not present

## 2019-10-24 DIAGNOSIS — I10 Essential (primary) hypertension: Secondary | ICD-10-CM | POA: Diagnosis not present

## 2019-10-24 DIAGNOSIS — Y92410 Unspecified street and highway as the place of occurrence of the external cause: Secondary | ICD-10-CM | POA: Diagnosis not present

## 2019-10-24 DIAGNOSIS — S138XXA Sprain of joints and ligaments of other parts of neck, initial encounter: Secondary | ICD-10-CM | POA: Diagnosis not present

## 2019-10-24 DIAGNOSIS — Y999 Unspecified external cause status: Secondary | ICD-10-CM | POA: Insufficient documentation

## 2019-10-24 DIAGNOSIS — Z79899 Other long term (current) drug therapy: Secondary | ICD-10-CM | POA: Insufficient documentation

## 2019-10-24 DIAGNOSIS — Z96659 Presence of unspecified artificial knee joint: Secondary | ICD-10-CM | POA: Diagnosis not present

## 2019-10-24 DIAGNOSIS — R519 Headache, unspecified: Secondary | ICD-10-CM | POA: Diagnosis not present

## 2019-10-24 MED ORDER — NAPROXEN 500 MG PO TABS: 500.0000 mg | ORAL_TABLET | Freq: Two times a day (BID) | ORAL | 0 refills | Status: DC | PRN

## 2019-10-24 MED ORDER — METHOCARBAMOL 500 MG PO TABS: 500.0000 mg | ORAL_TABLET | Freq: Every day | ORAL | 0 refills | Status: DC
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
## 2019-10-24 NOTE — ED Notes (Signed)
Pt verbalizes understanding of DC instructions. Pt belongings returned and is ambulatory out of ED.  
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
## 2019-10-24 NOTE — Discharge Instructions (Addendum)
Please take medications, as prescribed. You were given a prescription for Robaxin which is a muscle relaxer.  You should not drive, work, consume alcohol, or operate machinery while taking this medication as it can make you very drowsy.    Please follow-up with your primary care provider regarding today's encounter.  Return to the ED or seek medical attention for any new or worsening symptoms.

## 2019-10-25 DIAGNOSIS — I119 Hypertensive heart disease without heart failure: Secondary | ICD-10-CM | POA: Diagnosis not present

## 2019-10-25 DIAGNOSIS — F3341 Major depressive disorder, recurrent, in partial remission: Secondary | ICD-10-CM | POA: Diagnosis not present

## 2019-10-25 DIAGNOSIS — J45909 Unspecified asthma, uncomplicated: Secondary | ICD-10-CM | POA: Diagnosis not present

## 2019-10-25 DIAGNOSIS — I10 Essential (primary) hypertension: Secondary | ICD-10-CM | POA: Diagnosis not present

## 2019-10-25 DIAGNOSIS — M1990 Unspecified osteoarthritis, unspecified site: Secondary | ICD-10-CM | POA: Diagnosis not present

## 2019-10-25 DIAGNOSIS — E782 Mixed hyperlipidemia: Secondary | ICD-10-CM | POA: Diagnosis not present

## 2019-10-25 DIAGNOSIS — I2510 Atherosclerotic heart disease of native coronary artery without angina pectoris: Secondary | ICD-10-CM | POA: Diagnosis not present

## 2019-10-25 DIAGNOSIS — R7303 Prediabetes: Secondary | ICD-10-CM | POA: Diagnosis not present

## 2019-11-02 DIAGNOSIS — I10 Essential (primary) hypertension: Secondary | ICD-10-CM | POA: Diagnosis not present

## 2019-11-02 DIAGNOSIS — E782 Mixed hyperlipidemia: Secondary | ICD-10-CM | POA: Diagnosis not present

## 2019-11-02 DIAGNOSIS — M1990 Unspecified osteoarthritis, unspecified site: Secondary | ICD-10-CM | POA: Diagnosis not present

## 2019-11-02 DIAGNOSIS — I2510 Atherosclerotic heart disease of native coronary artery without angina pectoris: Secondary | ICD-10-CM | POA: Diagnosis not present

## 2019-11-02 DIAGNOSIS — F3341 Major depressive disorder, recurrent, in partial remission: Secondary | ICD-10-CM | POA: Diagnosis not present

## 2019-11-02 DIAGNOSIS — I119 Hypertensive heart disease without heart failure: Secondary | ICD-10-CM | POA: Diagnosis not present

## 2019-11-02 DIAGNOSIS — J45909 Unspecified asthma, uncomplicated: Secondary | ICD-10-CM | POA: Diagnosis not present

## 2019-11-02 DIAGNOSIS — R7303 Prediabetes: Secondary | ICD-10-CM | POA: Diagnosis not present

## 2019-11-07 DIAGNOSIS — F319 Bipolar disorder, unspecified: Secondary | ICD-10-CM | POA: Diagnosis not present

## 2019-11-07 DIAGNOSIS — F410 Panic disorder [episodic paroxysmal anxiety] without agoraphobia: Secondary | ICD-10-CM | POA: Diagnosis not present

## 2019-12-03 ENCOUNTER — Other Ambulatory Visit: Payer: Self-pay

## 2019-12-03 ENCOUNTER — Encounter (HOSPITAL_COMMUNITY): Payer: Self-pay | Admitting: Emergency Medicine

## 2019-12-03 ENCOUNTER — Emergency Department (HOSPITAL_COMMUNITY): Payer: Medicare Other

## 2019-12-03 ENCOUNTER — Emergency Department (HOSPITAL_COMMUNITY)
Admission: EM | Admit: 2019-12-03 | Discharge: 2019-12-03 | Disposition: A | Discharge status: Home / Self Care | Payer: Medicare Other | Attending: Emergency Medicine | Admitting: Emergency Medicine

## 2019-12-03 DIAGNOSIS — S20211A Contusion of right front wall of thorax, initial encounter: Secondary | ICD-10-CM | POA: Insufficient documentation

## 2019-12-03 DIAGNOSIS — I2510 Atherosclerotic heart disease of native coronary artery without angina pectoris: Secondary | ICD-10-CM | POA: Diagnosis not present

## 2019-12-03 DIAGNOSIS — Z96653 Presence of artificial knee joint, bilateral: Secondary | ICD-10-CM | POA: Insufficient documentation

## 2019-12-03 DIAGNOSIS — Y999 Unspecified external cause status: Secondary | ICD-10-CM | POA: Insufficient documentation

## 2019-12-03 DIAGNOSIS — Z79899 Other long term (current) drug therapy: Secondary | ICD-10-CM | POA: Diagnosis not present

## 2019-12-03 DIAGNOSIS — I10 Essential (primary) hypertension: Secondary | ICD-10-CM | POA: Diagnosis not present

## 2019-12-03 DIAGNOSIS — G5601 Carpal tunnel syndrome, right upper limb: Secondary | ICD-10-CM | POA: Diagnosis not present

## 2019-12-03 DIAGNOSIS — J45909 Unspecified asthma, uncomplicated: Secondary | ICD-10-CM | POA: Diagnosis not present

## 2019-12-03 DIAGNOSIS — G5603 Carpal tunnel syndrome, bilateral upper limbs: Secondary | ICD-10-CM | POA: Diagnosis not present

## 2019-12-03 DIAGNOSIS — G5602 Carpal tunnel syndrome, left upper limb: Secondary | ICD-10-CM | POA: Diagnosis not present

## 2019-12-03 DIAGNOSIS — M546 Pain in thoracic spine: Secondary | ICD-10-CM | POA: Diagnosis not present

## 2019-12-03 DIAGNOSIS — Y92410 Unspecified street and highway as the place of occurrence of the external cause: Secondary | ICD-10-CM | POA: Diagnosis not present

## 2019-12-03 DIAGNOSIS — Y93I9 Activity, other involving external motion: Secondary | ICD-10-CM | POA: Diagnosis not present

## 2019-12-03 DIAGNOSIS — S299XXA Unspecified injury of thorax, initial encounter: Secondary | ICD-10-CM | POA: Diagnosis not present

## 2019-12-03 MED ORDER — OXYCODONE-ACETAMINOPHEN 5-325 MG PO TABS
1.0000 | ORAL_TABLET | Freq: Once | ORAL | Status: AC
  Administered 2019-12-03: 1 via ORAL
  Filled 2019-12-03: qty 1

## 2019-12-03 MED ORDER — ACETAMINOPHEN ER 650 MG PO TBCR: 650.0000 mg | EXTENDED_RELEASE_TABLET | Freq: Three times a day (TID) | ORAL | 0 refills | Status: DC | PRN

## 2019-12-03 MED ORDER — METHOCARBAMOL 500 MG PO TABS
500.0000 mg | ORAL_TABLET | Freq: Once | ORAL | Status: AC
  Administered 2019-12-03: 22:00:00 500 mg via ORAL
  Filled 2019-12-03: qty 1

## 2019-12-03 MED ORDER — IBUPROFEN 600 MG PO TABS: 600.0000 mg | ORAL_TABLET | Freq: Four times a day (QID) | ORAL | 0 refills | Status: DC | PRN

## 2019-12-03 MED ORDER — NAPROXEN 500 MG PO TABS
500.0000 mg | ORAL_TABLET | Freq: Once | ORAL | Status: AC
  Administered 2019-12-03: 500 mg via ORAL
  Filled 2019-12-03: qty 1
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
## 2019-12-03 NOTE — ED Notes (Signed)
Pt calling for a ride home
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
## 2019-12-03 NOTE — ED Notes (Signed)
Patient transported to X-ray 
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
## 2019-12-21 DIAGNOSIS — R7303 Prediabetes: Secondary | ICD-10-CM | POA: Diagnosis not present

## 2019-12-21 DIAGNOSIS — I10 Essential (primary) hypertension: Secondary | ICD-10-CM | POA: Diagnosis not present

## 2019-12-21 DIAGNOSIS — I119 Hypertensive heart disease without heart failure: Secondary | ICD-10-CM | POA: Diagnosis not present

## 2019-12-21 DIAGNOSIS — E782 Mixed hyperlipidemia: Secondary | ICD-10-CM | POA: Diagnosis not present

## 2019-12-21 DIAGNOSIS — M1990 Unspecified osteoarthritis, unspecified site: Secondary | ICD-10-CM | POA: Diagnosis not present

## 2019-12-21 DIAGNOSIS — F3341 Major depressive disorder, recurrent, in partial remission: Secondary | ICD-10-CM | POA: Diagnosis not present

## 2019-12-21 DIAGNOSIS — I2510 Atherosclerotic heart disease of native coronary artery without angina pectoris: Secondary | ICD-10-CM | POA: Diagnosis not present

## 2019-12-21 DIAGNOSIS — M545 Low back pain: Secondary | ICD-10-CM | POA: Diagnosis not present

## 2020-01-04 DIAGNOSIS — G5601 Carpal tunnel syndrome, right upper limb: Secondary | ICD-10-CM | POA: Diagnosis not present

## 2020-01-04 DIAGNOSIS — I119 Hypertensive heart disease without heart failure: Secondary | ICD-10-CM | POA: Diagnosis not present

## 2020-01-04 DIAGNOSIS — G5603 Carpal tunnel syndrome, bilateral upper limbs: Secondary | ICD-10-CM | POA: Diagnosis not present

## 2020-01-04 DIAGNOSIS — E782 Mixed hyperlipidemia: Secondary | ICD-10-CM | POA: Diagnosis not present

## 2020-01-04 DIAGNOSIS — Z131 Encounter for screening for diabetes mellitus: Secondary | ICD-10-CM | POA: Diagnosis not present

## 2020-01-04 DIAGNOSIS — Z23 Encounter for immunization: Secondary | ICD-10-CM | POA: Diagnosis not present

## 2020-01-04 DIAGNOSIS — R7303 Prediabetes: Secondary | ICD-10-CM | POA: Diagnosis not present

## 2020-01-04 DIAGNOSIS — G5602 Carpal tunnel syndrome, left upper limb: Secondary | ICD-10-CM | POA: Diagnosis not present

## 2020-01-04 DIAGNOSIS — I10 Essential (primary) hypertension: Secondary | ICD-10-CM | POA: Diagnosis not present

## 2020-01-04 DIAGNOSIS — I2510 Atherosclerotic heart disease of native coronary artery without angina pectoris: Secondary | ICD-10-CM | POA: Diagnosis not present

## 2020-01-04 DIAGNOSIS — F3341 Major depressive disorder, recurrent, in partial remission: Secondary | ICD-10-CM | POA: Diagnosis not present

## 2020-01-04 DIAGNOSIS — M1990 Unspecified osteoarthritis, unspecified site: Secondary | ICD-10-CM | POA: Diagnosis not present

## 2020-01-23 DIAGNOSIS — I2510 Atherosclerotic heart disease of native coronary artery without angina pectoris: Secondary | ICD-10-CM | POA: Diagnosis not present

## 2020-01-23 DIAGNOSIS — M1990 Unspecified osteoarthritis, unspecified site: Secondary | ICD-10-CM | POA: Diagnosis not present

## 2020-01-23 DIAGNOSIS — I119 Hypertensive heart disease without heart failure: Secondary | ICD-10-CM | POA: Diagnosis not present

## 2020-01-23 DIAGNOSIS — I10 Essential (primary) hypertension: Secondary | ICD-10-CM | POA: Diagnosis not present

## 2020-01-23 DIAGNOSIS — F3341 Major depressive disorder, recurrent, in partial remission: Secondary | ICD-10-CM | POA: Diagnosis not present

## 2020-01-23 DIAGNOSIS — R7303 Prediabetes: Secondary | ICD-10-CM | POA: Diagnosis not present

## 2020-01-23 DIAGNOSIS — E782 Mixed hyperlipidemia: Secondary | ICD-10-CM | POA: Diagnosis not present

## 2020-01-30 DIAGNOSIS — F410 Panic disorder [episodic paroxysmal anxiety] without agoraphobia: Secondary | ICD-10-CM | POA: Diagnosis not present

## 2020-01-30 DIAGNOSIS — F319 Bipolar disorder, unspecified: Secondary | ICD-10-CM | POA: Diagnosis not present

## 2020-02-01 DIAGNOSIS — G5602 Carpal tunnel syndrome, left upper limb: Secondary | ICD-10-CM | POA: Diagnosis not present

## 2020-02-01 DIAGNOSIS — G5601 Carpal tunnel syndrome, right upper limb: Secondary | ICD-10-CM | POA: Diagnosis not present

## 2020-02-22 DIAGNOSIS — I119 Hypertensive heart disease without heart failure: Secondary | ICD-10-CM | POA: Diagnosis not present

## 2020-02-22 DIAGNOSIS — R7303 Prediabetes: Secondary | ICD-10-CM | POA: Diagnosis not present

## 2020-02-22 DIAGNOSIS — G5601 Carpal tunnel syndrome, right upper limb: Secondary | ICD-10-CM | POA: Diagnosis not present

## 2020-02-22 DIAGNOSIS — G5603 Carpal tunnel syndrome, bilateral upper limbs: Secondary | ICD-10-CM | POA: Diagnosis not present

## 2020-02-22 DIAGNOSIS — E782 Mixed hyperlipidemia: Secondary | ICD-10-CM | POA: Diagnosis not present

## 2020-02-22 DIAGNOSIS — I2510 Atherosclerotic heart disease of native coronary artery without angina pectoris: Secondary | ICD-10-CM | POA: Diagnosis not present

## 2020-02-22 DIAGNOSIS — G5602 Carpal tunnel syndrome, left upper limb: Secondary | ICD-10-CM | POA: Diagnosis not present

## 2020-02-22 DIAGNOSIS — F3341 Major depressive disorder, recurrent, in partial remission: Secondary | ICD-10-CM | POA: Diagnosis not present

## 2020-02-22 DIAGNOSIS — M1990 Unspecified osteoarthritis, unspecified site: Secondary | ICD-10-CM | POA: Diagnosis not present

## 2020-02-22 DIAGNOSIS — I10 Essential (primary) hypertension: Secondary | ICD-10-CM | POA: Diagnosis not present

## 2020-03-03 DIAGNOSIS — F319 Bipolar disorder, unspecified: Secondary | ICD-10-CM | POA: Diagnosis not present

## 2020-03-03 DIAGNOSIS — F410 Panic disorder [episodic paroxysmal anxiety] without agoraphobia: Secondary | ICD-10-CM | POA: Diagnosis not present

## 2020-03-05 DIAGNOSIS — R87610 Atypical squamous cells of undetermined significance on cytologic smear of cervix (ASC-US): Secondary | ICD-10-CM | POA: Diagnosis not present

## 2020-03-05 DIAGNOSIS — I2510 Atherosclerotic heart disease of native coronary artery without angina pectoris: Secondary | ICD-10-CM | POA: Diagnosis not present

## 2020-03-05 DIAGNOSIS — I119 Hypertensive heart disease without heart failure: Secondary | ICD-10-CM | POA: Diagnosis not present

## 2020-03-05 DIAGNOSIS — R7303 Prediabetes: Secondary | ICD-10-CM | POA: Diagnosis not present

## 2020-03-05 DIAGNOSIS — F3341 Major depressive disorder, recurrent, in partial remission: Secondary | ICD-10-CM | POA: Diagnosis not present

## 2020-03-05 DIAGNOSIS — E782 Mixed hyperlipidemia: Secondary | ICD-10-CM | POA: Diagnosis not present

## 2020-03-05 DIAGNOSIS — Z124 Encounter for screening for malignant neoplasm of cervix: Secondary | ICD-10-CM | POA: Diagnosis not present

## 2020-03-05 DIAGNOSIS — I10 Essential (primary) hypertension: Secondary | ICD-10-CM | POA: Diagnosis not present

## 2020-03-05 DIAGNOSIS — M1990 Unspecified osteoarthritis, unspecified site: Secondary | ICD-10-CM | POA: Diagnosis not present

## 2020-04-02 DIAGNOSIS — I119 Hypertensive heart disease without heart failure: Secondary | ICD-10-CM | POA: Diagnosis not present

## 2020-04-22 DIAGNOSIS — F410 Panic disorder [episodic paroxysmal anxiety] without agoraphobia: Secondary | ICD-10-CM | POA: Diagnosis not present

## 2020-04-22 DIAGNOSIS — F319 Bipolar disorder, unspecified: Secondary | ICD-10-CM | POA: Diagnosis not present

## 2020-05-08 DIAGNOSIS — G5761 Lesion of plantar nerve, right lower limb: Secondary | ICD-10-CM | POA: Diagnosis not present

## 2020-06-09 ENCOUNTER — Ambulatory Visit: Payer: Medicare Other | Admitting: Family Medicine

## 2020-06-26 ENCOUNTER — Ambulatory Visit: Payer: Medicare Other | Admitting: Obstetrics and Gynecology

## 2020-07-04 DIAGNOSIS — I2510 Atherosclerotic heart disease of native coronary artery without angina pectoris: Secondary | ICD-10-CM | POA: Diagnosis not present

## 2020-07-04 DIAGNOSIS — G5603 Carpal tunnel syndrome, bilateral upper limbs: Secondary | ICD-10-CM | POA: Diagnosis not present

## 2020-07-04 DIAGNOSIS — R7303 Prediabetes: Secondary | ICD-10-CM | POA: Diagnosis not present

## 2020-07-04 DIAGNOSIS — I10 Essential (primary) hypertension: Secondary | ICD-10-CM | POA: Diagnosis not present

## 2020-07-04 DIAGNOSIS — E782 Mixed hyperlipidemia: Secondary | ICD-10-CM | POA: Diagnosis not present

## 2020-07-04 DIAGNOSIS — I119 Hypertensive heart disease without heart failure: Secondary | ICD-10-CM | POA: Diagnosis not present

## 2020-07-04 DIAGNOSIS — F3341 Major depressive disorder, recurrent, in partial remission: Secondary | ICD-10-CM | POA: Diagnosis not present

## 2020-07-04 DIAGNOSIS — G5602 Carpal tunnel syndrome, left upper limb: Secondary | ICD-10-CM | POA: Diagnosis not present

## 2020-07-04 DIAGNOSIS — Z4789 Encounter for other orthopedic aftercare: Secondary | ICD-10-CM | POA: Diagnosis not present

## 2020-07-04 DIAGNOSIS — Z131 Encounter for screening for diabetes mellitus: Secondary | ICD-10-CM | POA: Diagnosis not present

## 2020-07-04 DIAGNOSIS — G5601 Carpal tunnel syndrome, right upper limb: Secondary | ICD-10-CM | POA: Diagnosis not present

## 2020-07-04 DIAGNOSIS — M1990 Unspecified osteoarthritis, unspecified site: Secondary | ICD-10-CM | POA: Diagnosis not present

## 2020-07-15 DIAGNOSIS — F410 Panic disorder [episodic paroxysmal anxiety] without agoraphobia: Secondary | ICD-10-CM | POA: Diagnosis not present

## 2020-07-15 DIAGNOSIS — F319 Bipolar disorder, unspecified: Secondary | ICD-10-CM | POA: Diagnosis not present

## 2020-08-16 DIAGNOSIS — Z23 Encounter for immunization: Secondary | ICD-10-CM | POA: Diagnosis not present

## 2020-09-30 DIAGNOSIS — F319 Bipolar disorder, unspecified: Secondary | ICD-10-CM | POA: Diagnosis not present

## 2020-09-30 DIAGNOSIS — F410 Panic disorder [episodic paroxysmal anxiety] without agoraphobia: Secondary | ICD-10-CM | POA: Diagnosis not present

## 2020-10-20 DIAGNOSIS — Z131 Encounter for screening for diabetes mellitus: Secondary | ICD-10-CM | POA: Diagnosis not present

## 2020-10-20 DIAGNOSIS — I2510 Atherosclerotic heart disease of native coronary artery without angina pectoris: Secondary | ICD-10-CM | POA: Diagnosis not present

## 2020-10-20 DIAGNOSIS — F3341 Major depressive disorder, recurrent, in partial remission: Secondary | ICD-10-CM | POA: Diagnosis not present

## 2020-10-20 DIAGNOSIS — I119 Hypertensive heart disease without heart failure: Secondary | ICD-10-CM | POA: Diagnosis not present

## 2020-10-20 DIAGNOSIS — E782 Mixed hyperlipidemia: Secondary | ICD-10-CM | POA: Diagnosis not present

## 2020-10-20 DIAGNOSIS — R7303 Prediabetes: Secondary | ICD-10-CM | POA: Diagnosis not present

## 2020-10-20 DIAGNOSIS — I10 Essential (primary) hypertension: Secondary | ICD-10-CM | POA: Diagnosis not present

## 2020-10-20 DIAGNOSIS — Z0001 Encounter for general adult medical examination with abnormal findings: Secondary | ICD-10-CM | POA: Diagnosis not present

## 2020-10-20 DIAGNOSIS — M1990 Unspecified osteoarthritis, unspecified site: Secondary | ICD-10-CM | POA: Diagnosis not present

## 2020-12-19 DIAGNOSIS — F410 Panic disorder [episodic paroxysmal anxiety] without agoraphobia: Secondary | ICD-10-CM | POA: Diagnosis not present

## 2020-12-19 DIAGNOSIS — F319 Bipolar disorder, unspecified: Secondary | ICD-10-CM | POA: Diagnosis not present

## 2021-01-06 DIAGNOSIS — I2510 Atherosclerotic heart disease of native coronary artery without angina pectoris: Secondary | ICD-10-CM | POA: Diagnosis not present

## 2021-01-06 DIAGNOSIS — R7303 Prediabetes: Secondary | ICD-10-CM | POA: Diagnosis not present

## 2021-01-06 DIAGNOSIS — I119 Hypertensive heart disease without heart failure: Secondary | ICD-10-CM | POA: Diagnosis not present

## 2021-01-06 DIAGNOSIS — F3341 Major depressive disorder, recurrent, in partial remission: Secondary | ICD-10-CM | POA: Diagnosis not present

## 2021-01-06 DIAGNOSIS — M1990 Unspecified osteoarthritis, unspecified site: Secondary | ICD-10-CM | POA: Diagnosis not present

## 2021-01-06 DIAGNOSIS — E782 Mixed hyperlipidemia: Secondary | ICD-10-CM | POA: Diagnosis not present

## 2021-01-06 DIAGNOSIS — I10 Essential (primary) hypertension: Secondary | ICD-10-CM | POA: Diagnosis not present

## 2021-01-09 DIAGNOSIS — G5603 Carpal tunnel syndrome, bilateral upper limbs: Secondary | ICD-10-CM | POA: Diagnosis not present

## 2021-02-01 ENCOUNTER — Other Ambulatory Visit: Payer: Self-pay

## 2021-02-01 ENCOUNTER — Ambulatory Visit
Admission: EM | Admit: 2021-02-01 | Discharge: 2021-02-01 | Disposition: A | Discharge status: Home / Self Care | Payer: Medicare Other | Attending: Emergency Medicine | Admitting: Emergency Medicine

## 2021-02-01 ENCOUNTER — Encounter: Payer: Self-pay | Admitting: *Deleted

## 2021-02-01 DIAGNOSIS — S20211A Contusion of right front wall of thorax, initial encounter: Secondary | ICD-10-CM | POA: Diagnosis not present

## 2021-02-01 DIAGNOSIS — M546 Pain in thoracic spine: Secondary | ICD-10-CM | POA: Diagnosis not present

## 2021-02-01 MED ORDER — CELECOXIB 100 MG PO CAPS: 100.0000 mg | ORAL_CAPSULE | Freq: Two times a day (BID) | ORAL | 0 refills | Status: DC

## 2021-02-01 MED ORDER — TIZANIDINE HCL 2 MG PO TABS: 2.0000 mg | ORAL_TABLET | Freq: Four times a day (QID) | ORAL | 0 refills | Status: DC | PRN
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
## 2021-02-01 NOTE — Discharge Instructions (Signed)
Celebrex twice daily with food Supplement tizanidine at home/bedtime-this is muscle relaxer, may cause drowsiness Alternate ice and heat Follow-up if not improving or worsening

## 2021-02-17 DIAGNOSIS — I2510 Atherosclerotic heart disease of native coronary artery without angina pectoris: Secondary | ICD-10-CM | POA: Diagnosis not present

## 2021-02-17 DIAGNOSIS — F3341 Major depressive disorder, recurrent, in partial remission: Secondary | ICD-10-CM | POA: Diagnosis not present

## 2021-02-17 DIAGNOSIS — R7989 Other specified abnormal findings of blood chemistry: Secondary | ICD-10-CM | POA: Diagnosis not present

## 2021-02-17 DIAGNOSIS — M1990 Unspecified osteoarthritis, unspecified site: Secondary | ICD-10-CM | POA: Diagnosis not present

## 2021-02-17 DIAGNOSIS — I10 Essential (primary) hypertension: Secondary | ICD-10-CM | POA: Diagnosis not present

## 2021-02-17 DIAGNOSIS — E782 Mixed hyperlipidemia: Secondary | ICD-10-CM | POA: Diagnosis not present

## 2021-02-17 DIAGNOSIS — I119 Hypertensive heart disease without heart failure: Secondary | ICD-10-CM | POA: Diagnosis not present

## 2021-02-17 DIAGNOSIS — R7303 Prediabetes: Secondary | ICD-10-CM | POA: Diagnosis not present

## 2021-04-15 DIAGNOSIS — F319 Bipolar disorder, unspecified: Secondary | ICD-10-CM | POA: Diagnosis not present

## 2021-04-15 DIAGNOSIS — F410 Panic disorder [episodic paroxysmal anxiety] without agoraphobia: Secondary | ICD-10-CM | POA: Diagnosis not present

## 2021-04-20 DIAGNOSIS — Z20822 Contact with and (suspected) exposure to covid-19: Secondary | ICD-10-CM | POA: Diagnosis not present

## 2021-04-23 DIAGNOSIS — B372 Candidiasis of skin and nail: Secondary | ICD-10-CM | POA: Diagnosis not present

## 2021-05-01 DIAGNOSIS — R7303 Prediabetes: Secondary | ICD-10-CM | POA: Diagnosis not present

## 2021-05-01 DIAGNOSIS — M1990 Unspecified osteoarthritis, unspecified site: Secondary | ICD-10-CM | POA: Diagnosis not present

## 2021-05-01 DIAGNOSIS — M940 Chondrocostal junction syndrome [Tietze]: Secondary | ICD-10-CM | POA: Diagnosis not present

## 2021-05-01 DIAGNOSIS — F3341 Major depressive disorder, recurrent, in partial remission: Secondary | ICD-10-CM | POA: Diagnosis not present

## 2021-05-01 DIAGNOSIS — E782 Mixed hyperlipidemia: Secondary | ICD-10-CM | POA: Diagnosis not present

## 2021-05-01 DIAGNOSIS — I119 Hypertensive heart disease without heart failure: Secondary | ICD-10-CM | POA: Diagnosis not present

## 2021-05-01 DIAGNOSIS — I2510 Atherosclerotic heart disease of native coronary artery without angina pectoris: Secondary | ICD-10-CM | POA: Diagnosis not present

## 2021-05-01 DIAGNOSIS — I10 Essential (primary) hypertension: Secondary | ICD-10-CM | POA: Diagnosis not present

## 2021-05-01 DIAGNOSIS — R7989 Other specified abnormal findings of blood chemistry: Secondary | ICD-10-CM | POA: Diagnosis not present

## 2021-05-19 DIAGNOSIS — Z20822 Contact with and (suspected) exposure to covid-19: Secondary | ICD-10-CM | POA: Diagnosis not present

## 2021-05-24 ENCOUNTER — Other Ambulatory Visit: Payer: Self-pay

## 2021-05-24 ENCOUNTER — Emergency Department (HOSPITAL_COMMUNITY): Payer: Medicare Other

## 2021-05-24 ENCOUNTER — Emergency Department (HOSPITAL_COMMUNITY)
Admission: EM | Admit: 2021-05-24 | Discharge: 2021-05-24 | Disposition: A | Discharge status: Home / Self Care | Payer: Medicare Other | Attending: Emergency Medicine | Admitting: Emergency Medicine

## 2021-05-24 ENCOUNTER — Encounter (HOSPITAL_COMMUNITY): Payer: Self-pay

## 2021-05-24 DIAGNOSIS — R0789 Other chest pain: Secondary | ICD-10-CM | POA: Insufficient documentation

## 2021-05-24 DIAGNOSIS — I2510 Atherosclerotic heart disease of native coronary artery without angina pectoris: Secondary | ICD-10-CM | POA: Diagnosis not present

## 2021-05-24 DIAGNOSIS — J45909 Unspecified asthma, uncomplicated: Secondary | ICD-10-CM | POA: Insufficient documentation

## 2021-05-24 DIAGNOSIS — M25511 Pain in right shoulder: Secondary | ICD-10-CM | POA: Diagnosis not present

## 2021-05-24 DIAGNOSIS — I119 Hypertensive heart disease without heart failure: Secondary | ICD-10-CM | POA: Insufficient documentation

## 2021-05-24 DIAGNOSIS — Z79899 Other long term (current) drug therapy: Secondary | ICD-10-CM | POA: Diagnosis not present

## 2021-05-24 DIAGNOSIS — Z96653 Presence of artificial knee joint, bilateral: Secondary | ICD-10-CM | POA: Insufficient documentation

## 2021-05-24 DIAGNOSIS — M79622 Pain in left upper arm: Secondary | ICD-10-CM | POA: Diagnosis not present

## 2021-05-24 LAB — COMPREHENSIVE METABOLIC PANEL
ALT: 17 U/L (ref 0–44)
AST: 19 U/L (ref 15–41)
Albumin: 3.9 g/dL (ref 3.5–5.0)
Alkaline Phosphatase: 92 U/L (ref 38–126)
Anion gap: 8 (ref 5–15)
BUN: 10 mg/dL (ref 6–20)
CO2: 28 mmol/L (ref 22–32)
Calcium: 9.7 mg/dL (ref 8.9–10.3)
Chloride: 104 mmol/L (ref 98–111)
Creatinine, Ser: 0.67 mg/dL (ref 0.44–1.00)
GFR, Estimated: >60 mL/min (ref >60)
Glucose, Bld: 101 mg/dL — ABNORMAL HIGH (ref 70–99)
Potassium: 3.9 mmol/L (ref 3.5–5.1)
Sodium: 140 mmol/L (ref 135–145)
Total Bilirubin: 0.4 mg/dL (ref 0.3–1.2)
Total Protein: 7 g/dL (ref 6.5–8.1)

## 2021-05-24 LAB — CBC WITH DIFFERENTIAL/PLATELET
Abs Immature Granulocytes: 0 10*3/uL (ref 0.00–0.07)
Basophils Absolute: 0 10*3/uL (ref 0.0–0.1)
Basophils Relative: 1 %
Eosinophils Absolute: 0.1 10*3/uL (ref 0.0–0.5)
Eosinophils Relative: 3 %
HCT: 41.2 % (ref 36.0–46.0)
Hemoglobin: 13 g/dL (ref 12.0–15.0)
Immature Granulocytes: 0 %
Lymphocytes Relative: 55 %
Lymphs Abs: 2.7 10*3/uL (ref 0.7–4.0)
MCH: 27.5 pg (ref 26.0–34.0)
MCHC: 31.6 g/dL (ref 30.0–36.0)
MCV: 87.3 fL (ref 80.0–100.0)
Monocytes Absolute: 0.4 10*3/uL (ref 0.1–1.0)
Monocytes Relative: 7 %
Neutro Abs: 1.7 10*3/uL (ref 1.7–7.7)
Neutrophils Relative %: 34 %
Platelets: 227 10*3/uL (ref 150–400)
RBC: 4.72 MIL/uL (ref 3.87–5.11)
RDW: 12.3 % (ref 11.5–15.5)
WBC: 4.9 10*3/uL (ref 4.0–10.5)
nRBC: 0 % (ref 0.0–0.2)

## 2021-05-24 LAB — TROPONIN I (HIGH SENSITIVITY)
Troponin I (High Sensitivity): 3 ng/L (ref <18)
Troponin I (High Sensitivity): 3 ng/L (ref <18)

## 2021-05-24 MED ORDER — NAPROXEN 500 MG PO TABS: 500.0000 mg | ORAL_TABLET | Freq: Two times a day (BID) | ORAL | 0 refills | Status: AC

## 2021-05-24 NOTE — ED Provider Notes (Signed)
[facility]-EMERGENCY DEPT Provider Note   CSN: [id number] Arrival date & time: 05/24/21  1023     History Chief Complaint  Patient presents with   Chest Pain    Jo Allen is a 55 y.o. female.  HPI Patient presents with pain in the left arm, right upper thorax.  She notes that she had episodes for some time, but where they typically consist of brief moments of discomfort, primarily in the left upper arm, today, while at church she had a more sustained period of pain in the left upper arm, right upper chest with radiation towards the right scapula.  No dyspnea, no syncope, no fever, no chills, no cough. No clear exertional, pleuritic components. Patient has no history of cardiac disease that she is aware of, but does have a history of hypertension.  She is a non-smoker.    Past Medical History:  Diagnosis Date   Asthma    CAD (coronary artery disease)    GERD (gastroesophageal reflux disease)    Heart disease    Hypertension    Obesity    Paresthesia 08/20/2015   Prediabetes    Transfusion history    68yrs ago    Patient Active Problem List   Diagnosis Date Noted   Paresthesia 08/20/2015    Past Surgical History:  Procedure Laterality Date   ANGIOPLASTY     BREAST BIOPSY Left    BREAST BIOPSY WITH SENTINEL LYMPH NODE BIOPSY AND NEEDLE LOCALIZATION Left    COLONOSCOPY WITH PROPOFOL N/A 02/19/2014   Procedure: COLONOSCOPY WITH PROPOFOL;  Surgeon: Charolett Bumpers, MD;  Location: [facility];  Service: Endoscopy;  Laterality: N/A;   JOINT REPLACEMENT Bilateral    TOTAL KNEE ARTHROPLASTY Bilateral      OB History   No obstetric history on file.     Family History  Problem Relation Age of Onset   Hypertension Mother    Diabetes Mother    Stroke Father    Depression Brother    Breast cancer Neg Hx     Social History   Tobacco Use   Smoking status: Never   Smokeless tobacco: Never  Substance Use Topics   Alcohol use: No   Drug  use: No    Home Medications Prior to Admission medications   Medication Sig Start Date End Date Taking? Authorizing Provider  naproxen (NAPROSYN) 500 MG tablet Take 1 tablet (500 mg total) by mouth 2 (two) times daily with a meal for 7 days. 05/24/21 05/31/21 Yes Gerhard Munch, MD  acetaminophen (TYLENOL 8 HOUR) 650 MG CR tablet Take 1 tablet (650 mg total) by mouth every 8 (eight) hours as needed. 12/03/19   Derwood Kaplan, MD  albuterol (PROVENTIL HFA;VENTOLIN HFA) 108 (90 Base) MCG/ACT inhaler Inhale 1-2 puffs into the lungs every 6 (six) hours as needed for wheezing or shortness of breath. 10/12/18   Eustace Moore, MD  Biotin 1000 MCG CHEW Chew 5,000 mcg by mouth daily.    [provider]  buPROPion (WELLBUTRIN XL) 150 MG 24 hr tablet Take 150 mg by mouth 3 (three) times daily. 11/22/19   [provider]  celecoxib (CELEBREX) 100 MG capsule Take 1-2 capsules (100-200 mg total) by mouth 2 (two) times daily. 02/01/21   Wieters, Hallie C, PA-C  Cholecalciferol (VITAMIN D3) 25 MCG (1000 UT) CAPS Take 2,000 Units by mouth daily.    [provider]  CRANBERRY PO Take 1 tablet by mouth daily.    [provider]  doxepin (SINEQUAN) 25 MG capsule Take 25-50 mg by mouth at bedtime as needed (sleep aid).  11/22/19   [provider]  DULoxetine (CYMBALTA) 30 MG capsule Take 30 mg by mouth 2 (two) times daily. 10/20/19   [provider]  ferrous sulfate 325 (65 FE) MG EC tablet Take 325 mg by mouth daily with breakfast.    [provider]  gabapentin (NEURONTIN) 600 MG tablet Take 1,200 mg by mouth 3 (three) times daily.  12/02/19   [provider]  Ginkgo 60 MG TABS Take 60 mg by mouth daily.    [provider]  hydrochlorothiazide (HYDRODIURIL) 25 MG tablet Take 25 mg by mouth daily.    [provider]  ibuprofen (ADVIL) 600 MG tablet Take 1 tablet (600 mg total) by mouth every 6 (six) hours as needed. 12/03/19    Derwood Kaplan, MD  Boris Lown Oil (OMEGA-3) 500 MG CAPS Take 500 mg by mouth daily.    [provider]  Lactobacillus (PROBIOTIC ACIDOPHILUS PO) Take 1 tablet by mouth daily.    [provider]  lamoTRIgine (LAMICTAL) 150 MG tablet Take 150 mg by mouth 2 (two) times daily. 11/22/19   [provider]  methocarbamol (ROBAXIN) 500 MG tablet Take 1 tablet (500 mg total) by mouth at bedtime. 10/24/19   Lorelee New, PA-C  montelukast (SINGULAIR) 10 MG tablet Take 10 mg by mouth at bedtime.    [provider]  Multiple Vitamins-Minerals (ONE-A-DAY WOMENS PO) Take 1 tablet by mouth daily.    [provider]  phentermine 37.5 MG capsule Take 37.5 mg by mouth every morning.    [provider]  tiZANidine (ZANAFLEX) 2 MG tablet Take 1-2 tablets (2-4 mg total) by mouth every 6 (six) hours as needed for muscle spasms. 02/01/21   Wieters, Hallie C, PA-C  verapamil (VERELAN PM) 360 MG 24 hr capsule Take 360 mg by mouth every morning.    [provider]  vitamin B-12 (CYANOCOBALAMIN) 1000 MCG tablet Take 1,000 mcg by mouth daily.    [provider]  vitamin E 100 UNIT capsule Take 300 Units by mouth daily.     [provider]    Allergies    Morphine and related, Penicillins, and Tramadol  Review of Systems   Review of Systems  Constitutional:        Per HPI, otherwise negative  HENT:         Per HPI, otherwise negative  Respiratory:         Per HPI, otherwise negative  Cardiovascular:        Per HPI, otherwise negative  Gastrointestinal:  Negative for vomiting.  Endocrine:       Negative aside from HPI  Genitourinary:        Neg aside from HPI   Musculoskeletal:        Per HPI, otherwise negative  Skin: Negative.   Neurological:  Negative for syncope.   Physical Exam Updated Vital Signs BP (!) 156/90   Pulse 81   Temp 98.1 F (36.7 C) (Oral)   Resp 18   Ht 5\' 5"  (1.651 m)   Wt 99 kg   SpO2 97%   BMI 36.32  kg/m   Physical Exam Vitals and nursing note reviewed.  Constitutional:      General: She is not in acute distress.    Appearance: She is obese.  HENT:     Head: Normocephalic and atraumatic.  Eyes:  Conjunctiva/sclera: Conjunctivae normal.  Cardiovascular:     Rate and Rhythm: Normal rate and regular rhythm.  Pulmonary:     Effort: Pulmonary effort is normal. No respiratory distress.     Breath sounds: Normal breath sounds. No stridor.  Chest:    Abdominal:     General: There is no distension.  Musculoskeletal:       Arms:     Comments: Aside from mild tenderness to palpation left upper arm musculoskeletal exam unremarkable  Skin:    General: Skin is warm and dry.  Neurological:     Mental Status: She is alert and oriented to person, place, and time.     Cranial Nerves: No cranial nerve deficit.    ED Results / Procedures / Treatments   Labs (all labs ordered are listed, but only abnormal results are displayed) Labs Reviewed  COMPREHENSIVE METABOLIC PANEL - Abnormal; Notable for the following components:      Result Value   Glucose, Bld 101 (*)    All other components within normal limits  CBC WITH DIFFERENTIAL/PLATELET  TROPONIN I (HIGH SENSITIVITY)  TROPONIN I (HIGH SENSITIVITY)    EKG EKG Interpretation  Date/Time:  Sunday May 24 2021 10:37:39 EDT Ventricular Rate:  78 PR Interval:  171 QRS Duration: 74 QT Interval:  380 QTC Calculation: 433 R Axis:   48 Text Interpretation: Sinus rhythm Low voltage, precordial leads Borderline T abnormalities, anterior leads Baseline wander Abnormal ECG Confirmed by Gerhard Munch [phone]) on 05/24/2021 11:27:27 AM  Radiology DG Chest 2 View  Result Date: 05/24/2021 CLINICAL DATA:  55 year old female with history of chest pain. EXAM: CHEST - 2 VIEW COMPARISON:  Chest x-ray 12/03/2019. FINDINGS: Lung volumes are normal. No consolidative airspace disease. No pleural effusions. No pneumothorax. No pulmonary nodule or  mass noted. Pulmonary vasculature and the cardiomediastinal silhouette are within normal limits. IMPRESSION: No radiographic evidence of acute cardiopulmonary disease. Electronically Signed   By: Trudie Reed M.D.   On: 05/24/2021 11:55   DG Shoulder Left  Result Date: 05/24/2021 CLINICAL DATA:  55 year old female with history of left shoulder pain. EXAM: LEFT SHOULDER - 2+ VIEW COMPARISON:  No priors. FINDINGS: There is no evidence of fracture or dislocation. There is no evidence of arthropathy or other focal bone abnormality. Soft tissues are unremarkable. IMPRESSION: Negative. Electronically Signed   By: Trudie Reed M.D.   On: 05/24/2021 11:56    Procedures Procedures   Medications Ordered in ED Medications - No data to display  ED Course  I have reviewed the triage vital signs and the nursing notes.  Pertinent labs & imaging results that were available during my care of the patient were reviewed by me and considered in my medical decision making (see chart for details). Cardiac 80s sinus normal Pulse ox 100% room air normal 2:24 PM On repeat exam the patient is in similar condition, no distress, hemodynamically unremarkable.  She does have mild hypertension, but no evidence for endorgan damage.  2 troponins are normal, I EKG is nonischemic, and now on this repeat exam she continues to have tenderness palpation with pressure applied to her left superior lateral shoulder, all reassuring, low suspicion for ACS, no evidence for pneumonia, pneumothorax, bacteremia, sepsis suspicion for musculoskeletal versus inflammatory etiology.  Patient comfortable with outpatient follow-up.   MDM Rules/Calculators/A&P MDM Number of Diagnoses or Management Options Atypical chest pain: new, needed workup   Amount and/or Complexity of Data Reviewed Clinical lab tests: ordered and reviewed Tests in the radiology  section of CPT: reviewed and ordered Tests in the medicine section of CPT: reviewed  and ordered Decide to obtain previous medical records or to obtain history from someone other than the patient: yes Independent visualization of images, tracings, or specimens: yes  Risk of Complications, Morbidity, and/or Mortality Presenting problems: high Diagnostic procedures: high Management options: high  Critical Care Total time providing critical care: < 30 minutes  Patient Progress Patient progress: stable    Final Clinical Impression(s) / ED Diagnoses Final diagnoses:  Atypical chest pain    Rx / DC Orders ED Discharge Orders          Ordered    naproxen (NAPROSYN) 500 MG tablet  2 times daily with meals        05/24/21 1424             Gerhard MunchLockwood, Derin Granquist, MD 05/24/21 1426

## 2021-05-24 NOTE — ED Provider Notes (Signed)
Emergency Medicine Provider Triage Evaluation Note  Jo Allen , a 55 y.o. female  was evaluated in triage.  Pt complains of right and left chest pain..  Chest pain started 3 days prior and has been constant since then.  Patient states that right sided chest pain wraps around her flank into her back.  Right-sided chest pain is worse when she moves her right arm.  Left-sided chest pain radiates into her left arm.  Left arm pain is worse when she raises her arm.  Patient describes her pain as sharp.  Patient reports that she has had intermittent shortness of breath over the last 3 days.  Patient reports feeling nauseous yesterday.  Patient denies any leg swelling, hemoptysis, vomiting, diaphoresis, fevers, chills, cough, abdominal pain, palpitations.  Patient denies any recent falls or injuries.  Patient states that she had COVID-19 1 month prior.  Patient states that she was tested on Wednesday and was found to be negative.  Review of Systems  Positive: Chest pain, nausea, shortness of breath, Negative: eg swelling, hemoptysis, vomiting, diaphoresis, fevers, chills, cough, abdominal pain, palpitations  Physical Exam  BP (!) 170/99 (BP Location: Left Arm)   Pulse 86   Temp 98.1 F (36.7 C) (Oral)   Resp 16   Ht 5\' 5"  (1.651 m)   Wt 99 kg   SpO2 97%   BMI 36.32 kg/m  Gen:   Awake, no distress   Resp:  Normal effort, lungs clear to auscultation bilaterally MSK:   Moves extremities without difficulty, complains of tenderness to palpation of left shoulder.  No swelling to bilateral lower extremities. Other:  +2 radial pulse bilaterally  Medical Decision Making  Medically screening exam initiated at 10:44 AM.  Appropriate orders placed.  Ahonesty Cobos was informed that the remainder of the evaluation will be completed by another provider, this initial triage assessment does not replace that evaluation, and the importance of remaining in the ED until their evaluation is complete.  The  patient appears stable so that the remainder of the work up may be completed by another provider.      Deretha Emory 05/24/21 1047    07/24/21, MD 05/24/21 [phone]

## 2021-05-24 NOTE — ED Triage Notes (Signed)
Patient reports pain to right chest radiating through to back and left arm numbness with shob and intermittent headache x3 days.

## 2021-05-24 NOTE — Discharge Instructions (Addendum)
As discussed, your evaluation today has been largely reassuring.  But, it is important that you monitor your condition carefully, and do not hesitate to return to the ED if you develop new, or concerning changes in your condition. ? ?Otherwise, please follow-up with your physician for appropriate ongoing care. ? ?

## 2021-05-27 DIAGNOSIS — Z20822 Contact with and (suspected) exposure to covid-19: Secondary | ICD-10-CM | POA: Diagnosis not present

## 2021-06-03 DIAGNOSIS — E782 Mixed hyperlipidemia: Secondary | ICD-10-CM | POA: Diagnosis not present

## 2021-06-03 DIAGNOSIS — I119 Hypertensive heart disease without heart failure: Secondary | ICD-10-CM | POA: Diagnosis not present

## 2021-06-03 DIAGNOSIS — I10 Essential (primary) hypertension: Secondary | ICD-10-CM | POA: Diagnosis not present

## 2021-06-03 DIAGNOSIS — R7303 Prediabetes: Secondary | ICD-10-CM | POA: Diagnosis not present

## 2021-06-03 DIAGNOSIS — I2510 Atherosclerotic heart disease of native coronary artery without angina pectoris: Secondary | ICD-10-CM | POA: Diagnosis not present

## 2021-06-03 DIAGNOSIS — F3341 Major depressive disorder, recurrent, in partial remission: Secondary | ICD-10-CM | POA: Diagnosis not present

## 2021-06-03 DIAGNOSIS — M1990 Unspecified osteoarthritis, unspecified site: Secondary | ICD-10-CM | POA: Diagnosis not present

## 2021-06-03 DIAGNOSIS — R7989 Other specified abnormal findings of blood chemistry: Secondary | ICD-10-CM | POA: Diagnosis not present

## 2021-06-15 DIAGNOSIS — F319 Bipolar disorder, unspecified: Secondary | ICD-10-CM | POA: Diagnosis not present

## 2021-06-15 DIAGNOSIS — F410 Panic disorder [episodic paroxysmal anxiety] without agoraphobia: Secondary | ICD-10-CM | POA: Diagnosis not present

## 2021-06-30 ENCOUNTER — Encounter (HOSPITAL_COMMUNITY): Payer: Self-pay

## 2021-06-30 ENCOUNTER — Other Ambulatory Visit: Payer: Self-pay

## 2021-06-30 ENCOUNTER — Emergency Department (HOSPITAL_COMMUNITY)
Admission: EM | Admit: 2021-06-30 | Discharge: 2021-07-01 | Disposition: A | Discharge status: Home / Self Care | Payer: Medicare Other | Attending: Emergency Medicine | Admitting: Emergency Medicine

## 2021-06-30 DIAGNOSIS — G501 Atypical facial pain: Secondary | ICD-10-CM | POA: Diagnosis not present

## 2021-06-30 DIAGNOSIS — I2510 Atherosclerotic heart disease of native coronary artery without angina pectoris: Secondary | ICD-10-CM | POA: Insufficient documentation

## 2021-06-30 DIAGNOSIS — R519 Headache, unspecified: Secondary | ICD-10-CM

## 2021-06-30 DIAGNOSIS — R410 Disorientation, unspecified: Secondary | ICD-10-CM | POA: Diagnosis not present

## 2021-06-30 DIAGNOSIS — I10 Essential (primary) hypertension: Secondary | ICD-10-CM | POA: Insufficient documentation

## 2021-06-30 DIAGNOSIS — R531 Weakness: Secondary | ICD-10-CM | POA: Diagnosis not present

## 2021-06-30 DIAGNOSIS — R29818 Other symptoms and signs involving the nervous system: Secondary | ICD-10-CM | POA: Insufficient documentation

## 2021-06-30 DIAGNOSIS — J45909 Unspecified asthma, uncomplicated: Secondary | ICD-10-CM | POA: Diagnosis not present

## 2021-06-30 DIAGNOSIS — R202 Paresthesia of skin: Secondary | ICD-10-CM | POA: Insufficient documentation

## 2021-06-30 NOTE — ED Triage Notes (Signed)
Pt states that she feels that something is wrong with her x 3 days. Pt states that she was in church and could not raise her left arm to praise the Lord. Pt states that her left arm feels heavy. Pt states that her face has some swelling. She states that she feels tingling in her left cheek that feels like its her nerves. Pt states that she googled it and it said that she is having stroke symptoms.

## 2021-07-01 ENCOUNTER — Emergency Department (HOSPITAL_COMMUNITY): Payer: Medicare Other

## 2021-07-01 DIAGNOSIS — R29818 Other symptoms and signs involving the nervous system: Secondary | ICD-10-CM | POA: Diagnosis not present

## 2021-07-01 LAB — COMPREHENSIVE METABOLIC PANEL
ALT: 18 U/L (ref 0–44)
AST: 22 U/L (ref 15–41)
Albumin: 4.2 g/dL (ref 3.5–5.0)
Alkaline Phosphatase: 105 U/L (ref 38–126)
Anion gap: 6 (ref 5–15)
BUN: 13 mg/dL (ref 6–20)
CO2: 29 mmol/L (ref 22–32)
Calcium: 9.4 mg/dL (ref 8.9–10.3)
Chloride: 102 mmol/L (ref 98–111)
Creatinine, Ser: 0.67 mg/dL (ref 0.44–1.00)
GFR, Estimated: >60 mL/min (ref >60)
Glucose, Bld: 104 mg/dL — ABNORMAL HIGH (ref 70–99)
Potassium: 3.6 mmol/L (ref 3.5–5.1)
Sodium: 137 mmol/L (ref 135–145)
Total Bilirubin: 0.4 mg/dL (ref 0.3–1.2)
Total Protein: 7.6 g/dL (ref 6.5–8.1)

## 2021-07-01 LAB — PROTIME-INR
INR: 1 (ref 0.8–1.2)
Prothrombin Time: 12.9 seconds (ref 11.4–15.2)

## 2021-07-01 LAB — CBC WITH DIFFERENTIAL/PLATELET
Abs Immature Granulocytes: 0.02 10*3/uL (ref 0.00–0.07)
Basophils Absolute: 0.1 10*3/uL (ref 0.0–0.1)
Basophils Relative: 1 %
Eosinophils Absolute: 0.2 10*3/uL (ref 0.0–0.5)
Eosinophils Relative: 2 %
HCT: 43 % (ref 36.0–46.0)
Hemoglobin: 13.4 g/dL (ref 12.0–15.0)
Immature Granulocytes: 0 %
Lymphocytes Relative: 50 %
Lymphs Abs: 3.7 10*3/uL (ref 0.7–4.0)
MCH: 27.1 pg (ref 26.0–34.0)
MCHC: 31.2 g/dL (ref 30.0–36.0)
MCV: 86.9 fL (ref 80.0–100.0)
Monocytes Absolute: 0.7 10*3/uL (ref 0.1–1.0)
Monocytes Relative: 9 %
Neutro Abs: 2.9 10*3/uL (ref 1.7–7.7)
Neutrophils Relative %: 38 %
Platelets: 275 10*3/uL (ref 150–400)
RBC: 4.95 MIL/uL (ref 3.87–5.11)
RDW: 12.4 % (ref 11.5–15.5)
WBC: 7.5 10*3/uL (ref 4.0–10.5)
nRBC: 0 % (ref 0.0–0.2)

## 2021-07-01 LAB — URINALYSIS, ROUTINE W REFLEX MICROSCOPIC
Bilirubin Urine: NEGATIVE
Glucose, UA: NEGATIVE mg/dL
Hgb urine dipstick: NEGATIVE
Ketones, ur: NEGATIVE mg/dL
Leukocytes,Ua: NEGATIVE
Nitrite: NEGATIVE
Protein, ur: NEGATIVE mg/dL
Specific Gravity, Urine: >1.03 — ABNORMAL HIGH (ref 1.005–1.030)
pH: 5.5 (ref 5.0–8.0)

## 2021-07-01 LAB — APTT: aPTT: 29 s (ref 24–36)

## 2021-07-01 NOTE — Discharge Instructions (Addendum)
Your evaluation in the emergency department today has been reassuring.  It is unclear what is causing your symptoms, though we feel he would benefit from follow-up with neurology.  You have been provided a referral to [facility] for further evaluation.  In the interim, continue follow-up with your primary care doctor.  You may return for new or concerning symptoms.

## 2021-07-01 NOTE — ED Notes (Signed)
Pt to MRI

## 2021-07-01 NOTE — ED Provider Notes (Signed)
[facility] DEPT Provider Note   CSN: [id number] Arrival date & time: 06/30/21  2248     History Chief Complaint  Patient presents with   Extremity Weakness    Jo Allen is a 55 y.o. female.  55 year old female with a history of hypertension, CAD, prediabetes presents to the emergency department for 3 days of constant weakness.  She feels as though she has been experiencing weakness in her left upper extremity.  States that she was in church and could not raise her left arm to praise the Lord.  Does report that there is some associated pain in her upper left shoulder associated with this reported weakness; however, also subjectively feels as though her left lower extremity has been weaker than normal as well.  Patient further reporting sensation of being "foggy headed".  Has noted some associated electrical shocking sensations in her left jaw.  These are sporadic and fleeting; lasting only a few seconds before spontaneously resolving.  She has not taken any medications for her myriad of symptoms.  Reports that she presented to the ED today as she is most concerned about the possibility of stroke.  There is a history of CVA in her mother, though no personal history of TIA or CVA.  Patient not chronically anticoagulated.  She reports that she has been under immense stress lately.  The history is provided by the patient. No language interpreter was used.  Extremity Weakness      Past Medical History:  Diagnosis Date   Asthma    CAD (coronary artery disease)    GERD (gastroesophageal reflux disease)    Heart disease    Hypertension    Obesity    Paresthesia 08/20/2015   Prediabetes    Transfusion history    19yrs ago    Patient Active Problem List   Diagnosis Date Noted   Paresthesia 08/20/2015    Past Surgical History:  Procedure Laterality Date   ANGIOPLASTY     BREAST BIOPSY Left    BREAST BIOPSY WITH SENTINEL LYMPH NODE BIOPSY AND  NEEDLE LOCALIZATION Left    COLONOSCOPY WITH PROPOFOL N/A 02/19/2014   Procedure: COLONOSCOPY WITH PROPOFOL;  Surgeon: Garlan Fair, MD;  Location: [facility];  Service: Endoscopy;  Laterality: N/A;   JOINT REPLACEMENT Bilateral    TOTAL KNEE ARTHROPLASTY Bilateral      OB History   No obstetric history on file.     Family History  Problem Relation Age of Onset   Hypertension Mother    Diabetes Mother    Stroke Father    Depression Brother    Breast cancer Neg Hx     Social History   Tobacco Use   Smoking status: Never   Smokeless tobacco: Never  Substance Use Topics   Alcohol use: No   Drug use: No    Home Medications Prior to Admission medications   Medication Sig Start Date End Date Taking? Authorizing Provider  acetaminophen (TYLENOL 8 HOUR) 650 MG CR tablet Take 1 tablet (650 mg total) by mouth every 8 (eight) hours as needed. 12/03/19   Varney Biles, MD  albuterol (PROVENTIL HFA;VENTOLIN HFA) 108 (90 Base) MCG/ACT inhaler Inhale 1-2 puffs into the lungs every 6 (six) hours as needed for wheezing or shortness of breath. 10/12/18   Raylene Everts, MD  Biotin 1000 MCG CHEW Chew 5,000 mcg by mouth daily.    [provider]  buPROPion (WELLBUTRIN XL) 150 MG 24 hr tablet Take 150 mg  by mouth 3 (three) times daily. 11/22/19   [provider]  celecoxib (CELEBREX) 100 MG capsule Take 1-2 capsules (100-200 mg total) by mouth 2 (two) times daily. 02/01/21   Wieters, Hallie C, PA-C  Cholecalciferol (VITAMIN D3) 25 MCG (1000 UT) CAPS Take 2,000 Units by mouth daily.    [provider]  CRANBERRY PO Take 1 tablet by mouth daily.    [provider]  doxepin (SINEQUAN) 25 MG capsule Take 25-50 mg by mouth at bedtime as needed (sleep aid).  11/22/19   [provider]  DULoxetine (CYMBALTA) 30 MG capsule Take 30 mg by mouth 2 (two) times daily. 10/20/19   [provider]  ferrous sulfate 325 (65 FE) MG EC tablet Take 325 mg by  mouth daily with breakfast.    [provider]  gabapentin (NEURONTIN) 600 MG tablet Take 1,200 mg by mouth 3 (three) times daily.  12/02/19   [provider]  Ginkgo 60 MG TABS Take 60 mg by mouth daily.    [provider]  hydrochlorothiazide (HYDRODIURIL) 25 MG tablet Take 25 mg by mouth daily.    [provider]  ibuprofen (ADVIL) 600 MG tablet Take 1 tablet (600 mg total) by mouth every 6 (six) hours as needed. 12/03/19   Varney Biles, MD  Javier Docker Oil (OMEGA-3) 500 MG CAPS Take 500 mg by mouth daily.    [provider]  Lactobacillus (PROBIOTIC ACIDOPHILUS PO) Take 1 tablet by mouth daily.    [provider]  lamoTRIgine (LAMICTAL) 150 MG tablet Take 150 mg by mouth 2 (two) times daily. 11/22/19   [provider]  methocarbamol (ROBAXIN) 500 MG tablet Take 1 tablet (500 mg total) by mouth at bedtime. 10/24/19   Corena Herter, PA-C  montelukast (SINGULAIR) 10 MG tablet Take 10 mg by mouth at bedtime.    [provider]  Multiple Vitamins-Minerals (ONE-A-DAY WOMENS PO) Take 1 tablet by mouth daily.    [provider]  phentermine 37.5 MG capsule Take 37.5 mg by mouth every morning.    [provider]  tiZANidine (ZANAFLEX) 2 MG tablet Take 1-2 tablets (2-4 mg total) by mouth every 6 (six) hours as needed for muscle spasms. 02/01/21   Wieters, Hallie C, PA-C  verapamil (VERELAN PM) 360 MG 24 hr capsule Take 360 mg by mouth every morning.    [provider]  vitamin B-12 (CYANOCOBALAMIN) 1000 MCG tablet Take 1,000 mcg by mouth daily.    [provider]  vitamin E 100 UNIT capsule Take 300 Units by mouth daily.     [provider]    Allergies    Morphine and related, Penicillins, and Tramadol  Review of Systems   Review of Systems  Constitutional:  Negative for fever.  Musculoskeletal:  Positive for extremity weakness.  Neurological:  Negative for syncope.  Ten systems reviewed  and are negative for acute change, except as noted in the HPI.    Physical Exam Updated Vital Signs BP (!) 141/79   Pulse 70   Temp 98.4 F (36.9 C) (Oral)   Resp 16   Ht $R'5\' 5"'EN$  (1.651 m)   Wt 99.8 kg   SpO2 99%   BMI 36.61 kg/m   Physical Exam Vitals and nursing note reviewed.  Constitutional:      General: She is not in acute distress.    Appearance: She is well-developed. She is not diaphoretic.     Comments: Nontoxic-appearing and in no distress  HENT:     Head: Normocephalic and atraumatic.     Right Ear: External ear normal.     Left Ear: External ear normal.     Mouth/Throat:     Mouth: Mucous membranes are moist.  Eyes:     General: No scleral icterus.    Extraocular Movements: Extraocular movements intact.     Conjunctiva/sclera: Conjunctivae normal.     Pupils: Pupils are equal, round, and reactive to light.     Comments: No nystagmus  Neck:     Comments: No nuchal rigidity or meningismus Cardiovascular:     Rate and Rhythm: Normal rate and regular rhythm.     Pulses: Normal pulses.  Pulmonary:     Effort: Pulmonary effort is normal. No respiratory distress.     Comments: Respirations even and unlabored Musculoskeletal:        General: Normal range of motion.     Cervical back: Normal range of motion.     Comments: Full range of motion of the left upper extremity.  No crepitus or deformity to L shoulder.  Skin:    General: Skin is warm and dry.     Coloration: Skin is not pale.     Findings: No erythema or rash.  Neurological:     Mental Status: She is alert and oriented to person, place, and time.     Coordination: Coordination normal.     Comments: GCS 15. Speech is goal oriented. No cranial nerve deficits appreciated; symmetric eyebrow raise, no facial drooping, tongue midline. Patient has equal grip strength bilaterally.  There is 5/5 strength against resistance in all major muscle groups of the bilateral upper extremities.  Patient has 4/5 strength  against resistance in the left lower extremity compared to 5/5 strength against resistance in the right lower extremity.  This is favored to be secondary to poor effort as when patient is asked to keep her left leg elevated off the bed, against gravity, it falls immediately down with the patient stating she is unable to lift it.  These findings are inconsistent when asked to try and raise the leg off the bed with prior strength against resistance testing.  Patient moves extremities without ataxia.  Psychiatric:        Behavior: Behavior normal.    ED Results / Procedures / Treatments   Labs (all labs ordered are listed, but only abnormal results are displayed) Labs Reviewed  COMPREHENSIVE METABOLIC PANEL - Abnormal; Notable for the following components:      Result Value   Glucose, Bld 104 (*)    All other components within normal limits  URINALYSIS, ROUTINE W REFLEX MICROSCOPIC - Abnormal; Notable for the following components:   Specific Gravity, Urine >1.030 (*)    All other components within normal limits  CBC WITH DIFFERENTIAL/PLATELET  PROTIME-INR  APTT    EKG EKG Interpretation  Date/Time:  Tuesday June 30 2021 23:50:15 EDT Ventricular Rate:  68 PR Interval:  164 QRS Duration: 84 QT Interval:  413 QTC Calculation: 440 R Axis:   39 Text Interpretation: Sinus rhythm Normal ECG Confirmed by Veryl Speak [phone]) on 07/01/2021 12:35:26 AM  Radiology CT HEAD WO CONTRAST  Result Date: 07/01/2021 CLINICAL DATA:  Left upper extremity weakness. EXAM: CT HEAD WITHOUT CONTRAST TECHNIQUE: Contiguous axial images were obtained from the base of the skull through the vertex without intravenous contrast. COMPARISON:  November 20, 2014 FINDINGS: Brain: No evidence of acute infarction, hemorrhage, hydrocephalus, extra-axial collection or mass lesion/mass effect. Vascular:  No hyperdense vessel or unexpected calcification. Skull: Normal. Negative for fracture or focal lesion. Sinuses/Orbits:  Mild sphenoid sinus mucosal thickening is noted. Other: A metallic density right posterior parietal scalp soft tissue foreign body is seen. IMPRESSION: 1. No acute intracranial abnormality. 2. Mild sphenoid sinus mucosal thickening. 3. Metallic density right posterior parietal scalp soft tissue foreign body. Electronically Signed   By: Virgina Norfolk M.D.   On: 07/01/2021 02:08    Procedures Procedures   Medications Ordered in ED Medications - No data to display  ED Course  I have reviewed the triage vital signs and the nursing notes.  Pertinent labs & imaging results that were available during my care of the patient were reviewed by me and considered in my medical decision making (see chart for details).    MDM Rules/Calculators/A&P                           55 year old female presents to the emergency department for a myriad of symptoms, but primarily with a complaint of subjective weakness in her left arm as well as her left leg.  Reports that symptoms have been constant for the past 3 days.  Her neurologic exam is inconsistent with stroke.  Any deficits are interpreted to be secondary to poor effort.  There is no facial asymmetry, drooping.  Speech is clear, goal oriented.  She has no issues with articulation or recollection of recent events.  Denies recent infectious symptoms as well as fever.  No history of any trauma or injury inciting her complaints.  She does report that she has been under increased stress lately.  Complaining of some electrical shocking sensations on the left side of her face which are fleeting.  There was the consideration of this may be secondary to trigeminal neuralgia; however, she then reports that this pain radiates into her left shoulder.  Question whether pain may be resulting in her upper extremity subjective weakness.  She is not objectively weak in her upper extremities on exam.  Symptoms may also be rooted in her increased situational stress, though this  would be difficult to prove/confirm.  Laboratory evaluation has been reassuring.  CT is negative for acute intracranial abnormality.  Given that symptoms have remained constant and present for the past 3 days, decision was made to proceed with MRI to assess for CVA.  If MRI is negative, I do not feel that stroke is responsible for her ongoing symptoms.  Would benefit from outpatient neurologic follow-up if MRI without acute pathology.  Care signed out to [place], PA-C at shift change.   Final Clinical Impression(s) / ED Diagnoses Final diagnoses:  Left-sided weakness  Left facial pain    Rx / DC Orders ED Discharge Orders          Ordered    Ambulatory referral to Neurology       Comments: An appointment is requested in approximately: 1 week   07/01/21 0632             Antonietta Breach, PA-C 07/01/21 [place], [place], MD 07/02/21 [phone]
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
## 2021-07-06 DIAGNOSIS — R7303 Prediabetes: Secondary | ICD-10-CM | POA: Diagnosis not present

## 2021-07-06 DIAGNOSIS — I119 Hypertensive heart disease without heart failure: Secondary | ICD-10-CM | POA: Diagnosis not present

## 2021-07-06 DIAGNOSIS — F3341 Major depressive disorder, recurrent, in partial remission: Secondary | ICD-10-CM | POA: Diagnosis not present

## 2021-07-06 DIAGNOSIS — I10 Essential (primary) hypertension: Secondary | ICD-10-CM | POA: Diagnosis not present

## 2021-07-06 DIAGNOSIS — E782 Mixed hyperlipidemia: Secondary | ICD-10-CM | POA: Diagnosis not present

## 2021-07-06 DIAGNOSIS — R7989 Other specified abnormal findings of blood chemistry: Secondary | ICD-10-CM | POA: Diagnosis not present

## 2021-07-06 DIAGNOSIS — I2510 Atherosclerotic heart disease of native coronary artery without angina pectoris: Secondary | ICD-10-CM | POA: Diagnosis not present

## 2021-07-06 DIAGNOSIS — M1990 Unspecified osteoarthritis, unspecified site: Secondary | ICD-10-CM | POA: Diagnosis not present

## 2021-07-09 ENCOUNTER — Encounter: Payer: Self-pay | Admitting: Neurology

## 2021-07-09 ENCOUNTER — Other Ambulatory Visit: Payer: Self-pay

## 2021-07-09 ENCOUNTER — Ambulatory Visit (INDEPENDENT_AMBULATORY_CARE_PROVIDER_SITE_OTHER): Payer: Medicare Other | Admitting: Neurology

## 2021-07-09 VITALS — BP 119/77 | HR 74 | Ht 64.0 in | Wt 233.6 lb

## 2021-07-09 DIAGNOSIS — M549 Dorsalgia, unspecified: Secondary | ICD-10-CM | POA: Diagnosis not present

## 2021-07-09 DIAGNOSIS — G500 Trigeminal neuralgia: Secondary | ICD-10-CM | POA: Diagnosis not present

## 2021-07-09 DIAGNOSIS — R200 Anesthesia of skin: Secondary | ICD-10-CM

## 2021-07-09 DIAGNOSIS — R202 Paresthesia of skin: Secondary | ICD-10-CM | POA: Diagnosis not present

## 2021-07-09 DIAGNOSIS — G8929 Other chronic pain: Secondary | ICD-10-CM

## 2021-07-09 DIAGNOSIS — R519 Headache, unspecified: Secondary | ICD-10-CM | POA: Diagnosis not present

## 2021-07-09 MED ORDER — PREGABALIN 50 MG PO CAPS: 50.0000 mg | ORAL_CAPSULE | Freq: Three times a day (TID) | ORAL | 5 refills | Status: DC
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
## 2021-07-09 NOTE — Patient Instructions (Addendum)
EMG/NCS left hand Start Lyrica for migraine and trigeminal neuralgia Low back pain and radicular symptoms: she has appointment with pain management next week  Pregabalin Capsules What is this medication? PREGABALIN (pre GAB a lin) treats nerve pain. It may also be used to prevent and control seizures in people with epilepsy. It works by calming overactive nerves in your body. This medicine may be used for other purposes; ask your health care provider or pharmacist if you have questions. COMMON BRAND NAME(S): Lyrica What should I tell my care team before I take this medication? They need to know if you have any of these conditions: Drug abuse or addiction Heart failure Kidney disease Lung disease Suicidal thoughts, plans or attempt An unusual or allergic reaction to pregabalin, other medications, foods, dyes, or preservatives Pregnant or trying to get pregnant Breast-feeding How should I use this medication? Take this medication by mouth with water. Take it as directed on the prescription label at the same time every day. You can take it with or without food. If it upsets your stomach, take it with food. Keep taking it unless your care team tells you to stop. A special MedGuide will be given to you by the pharmacist with each prescription and refill. Be sure to read this information carefully each time. Talk to your care team about the use of this medication in children. While it may be prescribed for children as young as 1 month for selected conditions, precautions do apply. Overdosage: If you think you have taken too much of this medicine contact a poison control center or emergency room at once. NOTE: This medicine is only for you. Do not share this medicine with others. What if I miss a dose? If you miss a dose, take it as soon as you can. If it is almost time for your next dose, take only that dose. Do not take double or extra doses. What may interact with this  medication? Alcohol Antihistamines for allergy, cough, and cold Certain medications for anxiety or sleep Certain medications for blood pressure, heart disease Certain medications for depression like amitriptyline, fluoxetine, sertraline Certain medications for diabetes, like pioglitazone, rosiglitazone Certain medications for seizures like phenobarbital, primidone General anesthetics like halothane, isoflurane, methoxyflurane, propofol Medications that relax muscles for surgery Narcotic medications for pain Phenothiazines like chlorpromazine, mesoridazine, prochlorperazine, thioridazine This list may not describe all possible interactions. Give your health care provider a list of all the medicines, herbs, non-prescription drugs, or dietary supplements you use. Also tell them if you smoke, drink alcohol, or use illegal drugs. Some items may interact with your medicine. What should I watch for while using this medication? Visit your care team for regular checks on your progress. Tell your care team if your symptoms do not start to get better or if they get worse. Do not suddenly stop taking this medication. You may develop a severe reaction. Your care team will tell you how much medication to take. If your care team wants you to stop the medication, the dose may be slowly lowered over time to avoid any side effects. You may get drowsy or dizzy. Do not drive, use machinery, or do anything that needs mental alertness until you know how this medication affects you. Do not stand up or sit up quickly, especially if you are an older patient. This reduces the risk of dizzy or fainting spells. Alcohol may interfere with the effect of this medication. Avoid alcoholic drinks. If you or your family notice any changes  in your behavior, such as new or worsening depression, thoughts of harming yourself, anxiety, other unusual or disturbing thoughts, or memory loss, call your care team right away. Wear a medical ID  bracelet or chain if you are taking this medication for seizures. Carry a card that describes your condition. List the medications and doses you take on the card. This medication may make it more difficult to father a child. Talk to your care team if you are concerned about your fertility. What side effects may I notice from receiving this medication? Side effects that you should report to your care team as soon as possible: Allergic reactions or angioedema-skin rash, itching, hives, swelling of the face, eyes, lips, tongue, arms, or legs, trouble swallowing or breathing Blurry vision Thoughts of suicide or self-harm, worsening mood, feelings of depression Trouble breathing Side effects that usually do not require medical attention (report to your care team if they continue or are bothersome): Dizziness Drowsiness Dry mouth Nausea Swelling of the ankles, feet, hands Vomiting Weight gain This list may not describe all possible side effects. Call your doctor for medical advice about side effects. You may report side effects to FDA at 1-800-FDA-1088. Where should I keep my medication? Keep out of the reach of children and pets. This medication can be abused. Keep it in a safe place to protect it from theft. Do not share it with anyone. It is only for you. Selling or giving away this medication is dangerous and against the law. Store at [employer] [address] (77 degrees F). Get rid of any unused medication after the expiration date. This medication may cause harm and death if it is taken by other adults, children, or pets. It is important to get rid of the medication as soon as you no longer need it, or it is expired. You can do this in two ways: Take the medication to a medication take-back program. Check with your pharmacy or law enforcement to find a location. If you cannot return the medication, check the label or package insert to see if the medication should be thrown out in the garbage or flushed  down the toilet. If you are not sure, ask your care team. If it is safe to put it in the trash, take the medication out of the container. Mix the medication with cat litter, dirt, coffee grounds, or other unwanted substance. Seal the mixture in a bag or container. Put it in the trash. NOTE: This sheet is a summary. It may not cover all possible information. If you have questions about this medicine, talk to your doctor, pharmacist, or health care provider.  2022 Elsevier/Gold Standard (2020-10-08 22:38:58) Electromyoneurogram Electromyoneurogram is a test to check how well your muscles and nerves are working. This procedure includes the combined use of electromyogram (EMG) and nerve conduction study (NCS). EMG is used to look for muscular disorders. NCS, which is also called electroneurogram, measures how well your nerves are controlling your muscles. The procedures are usually done together to check if your muscles and nerves are healthy. If the results of the tests are abnormal, this may indicate disease or injury, such as a neuromuscular disease or peripheral nerve damage. Tell a health care provider about: Any allergies you have. All medicines you are taking, including vitamins, herbs, eye drops, creams, and over-the-counter medicines. Any problems you or family members have had with anesthetic medicines. Any blood disorders you have. Any surgeries you have had. Any medical conditions you have. If you have  a pacemaker. Whether you are pregnant or may be pregnant. What are the risks? Generally, this is a safe procedure. However, problems may occur, including: Infection where the electrodes were inserted. Bleeding. What happens before the procedure? Medicines Ask your health care provider about: Changing or stopping your regular medicines. This is especially important if you are taking diabetes medicines or blood thinners. Taking medicines such as aspirin and ibuprofen. These medicines can  thin your blood. Do not take these medicines unless your health care provider tells you to take them. Taking over-the-counter medicines, vitamins, herbs, and supplements. General instructions Your health care provider may ask you to avoid: Beverages that have caffeine, such as coffee and tea. Any products that contain nicotine or tobacco. These products include cigarettes, e-cigarettes, and chewing tobacco. If you need help quitting, ask your health care provider. Do not use lotions or creams on the same day that you will be having the procedure. What happens during the procedure? For EMG  Your health care provider will ask you to stay in a position so that he or she can access the muscle that will be studied. You may be standing, sitting, or lying down. You may be given a medicine that numbs the area (local anesthetic). A very thin needle that has an electrode will be inserted into your muscle. Another small electrode will be placed on your skin near the muscle. Your health care provider will ask you to continue to remain still. The electrodes will send a signal that tells about the electrical activity of your muscles. You may see this on a monitor or hear it in the room. After your muscles have been studied at rest, your health care provider will ask you to contract or flex your muscles. The electrodes will send a signal that tells about the electrical activity of your muscles. Your health care provider will remove the electrodes and the electrode needles when the procedure is finished. The procedure may vary among health care providers and hospitals. For NCS  An electrode that records your nerve activity (recording electrode) will be placed on your skin by the muscle that is being studied. An electrode that is used as a reference (reference electrode) will be placed near the recording electrode. A paste or gel will be applied to your skin between the recording electrode and the reference  electrode. Your nerve will be stimulated with a mild shock. Your health care provider will measure how much time it takes for your muscle to react. Your health care provider will remove the electrodes and the gel when the procedure is finished. The procedure may vary among health care providers and hospitals. What happens after the procedure? It is up to you to get the results of your procedure. Ask your health care provider, or the department that is doing the procedure, when your results will be ready. Your health care provider may: Give you medicines for any pain. Monitor the insertion sites to make sure that bleeding stops. Summary Electromyoneurogram is a test to check how well your muscles and nerves are working. If the results of the tests are abnormal, this may indicate disease or injury. This is a safe procedure. However, problems may occur, such as bleeding and infection. Your health care provider will do two tests to complete this procedure. One checks your muscles (EMG) and another checks your nerves (NCS). It is up to you to get the results of your procedure. Ask your health care provider, or the department that is  doing the procedure, when your results will be ready. This information is not intended to replace advice given to you by your health care provider. Make sure you discuss any questions you have with your health care provider. Document Revised: 06/20/2018 Document Reviewed: 06/02/2018 Elsevier Patient Education  2022 Else[employer] Inc.   Migraine Headache A migraine headache is an intense, throbbing pain on one side or both sides of the head. Migraine headaches may also cause other symptoms, such as nausea, vomiting, and sensitivity to light and noise. A migraine headache can last from 4 hours to 3 days. Talk with your doctor about what things may bring on (trigger) your migraine headaches. What are the causes? The exact cause of this condition is not known. However, a  migraine may be caused when nerves in the brain become irritated and release chemicals that cause inflammation of blood vessels. This inflammation causes pain. This condition may be triggered or caused by: Drinking alcohol. Smoking. Taking medicines, such as: Medicine used to treat chest pain (nitroglycerin). Birth control pills. Estrogen. Certain blood pressure medicines. Eating or drinking products that contain nitrates, glutamate, aspartame, or tyramine. Aged cheeses, chocolate, or caffeine may also be triggers. Doing physical activity. Other things that may trigger a migraine headache include: Menstruation. Pregnancy. Hunger. Stress. Lack of sleep or too much sleep. Weather changes. Fatigue. What increases the risk? The following factors may make you more likely to experience migraine headaches: Being a certain age. This condition is more common in people who are 48-49 years old. Being female. Having a family history of migraine headaches. Being Caucasian. Having a mental health condition, such as depression or anxiety. Being obese. What are the signs or symptoms? The main symptom of this condition is pulsating or throbbing pain. This pain may: Happen in any area of the head, such as on one side or both sides. Interfere with daily activities. Get worse with physical activity. Get worse with exposure to bright lights or loud noises. Other symptoms may include: Nausea. Vomiting. Dizziness. General sensitivity to bright lights, loud noises, or smells. Before you get a migraine headache, you may get warning signs (an aura). An aura may include: Seeing flashing lights or having blind spots. Seeing bright spots, halos, or zigzag lines. Having tunnel vision or blurred vision. Having numbness or a tingling feeling. Having trouble talking. Having muscle weakness. Some people have symptoms after a migraine headache (postdromal phase), such as: Feeling tired. Difficulty  concentrating. How is this diagnosed? A migraine headache can be diagnosed based on: Your symptoms. A physical exam. Tests, such as: CT scan or an MRI of the head. These imaging tests can help rule out other causes of headaches. Taking fluid from the spine (lumbar puncture) and analyzing it (cerebrospinal fluid analysis, or CSF analysis). How is this treated? This condition may be treated with medicines that: Relieve pain. Relieve nausea. Prevent migraine headaches. Treatment for this condition may also include: Acupuncture. Lifestyle changes like avoiding foods that trigger migraine headaches. Biofeedback. Cognitive behavioral therapy. Follow these instructions at home: Medicines Take over-the-counter and prescription medicines only as told by your health care provider. Ask your health care provider if the medicine prescribed to you: Requires you to avoid driving or using heavy machinery. Can cause constipation. You may need to take these actions to prevent or treat constipation: Drink enough fluid to keep your urine pale yellow. Take over-the-counter or prescription medicines. Eat foods that are high in fiber, such as beans, whole grains, and fresh fruits and  vegetables. Limit foods that are high in fat and processed sugars, such as fried or sweet foods. Lifestyle Do not drink alcohol. Do not use any products that contain nicotine or tobacco, such as cigarettes, e-cigarettes, and chewing tobacco. If you need help quitting, ask your health care provider. Get at least 8 hours of sleep every night. Find ways to manage stress, such as meditation, deep breathing, or yoga. General instructions   Keep a journal to find out what may trigger your migraine headaches. For example, write down: What you eat and drink. How much sleep you get. Any change to your diet or medicines. If you have a migraine headache: Avoid things that make your symptoms worse, such as bright lights. It may  help to lie down in a dark, quiet room. Do not drive or use heavy machinery. Ask your health care provider what activities are safe for you while you are experiencing symptoms. Keep all follow-up visits as told by your health care provider. This is important. Contact a health care provider if: You develop symptoms that are different or more severe than your usual migraine headache symptoms. You have more than 15 headache days in one month. Get help right away if: Your migraine headache becomes severe. Your migraine headache lasts longer than 72 hours. You have a fever. You have a stiff neck. You have vision loss. Your muscles feel weak or like you cannot control them. You start to lose your balance often. You have trouble walking. You faint. You have a seizure. Summary A migraine headache is an intense, throbbing pain on one side or both sides of the head. Migraines may also cause other symptoms, such as nausea, vomiting, and sensitivity to light and noise. This condition may be treated with medicines and lifestyle changes. You may also need to avoid certain things that trigger a migraine headache. Keep a journal to find out what may trigger your migraine headaches. Contact your health care provider if you have more than 15 headache days in a month or you develop symptoms that are different or more severe than your usual migraine headache symptoms. This information is not intended to replace advice given to you by your health care provider. Make sure you discuss any questions you have with your health care provider. Document Revised: 01/26/2019 Document Reviewed: 11/16/2018 Elsevier Patient Education  2022 Elsevier Inc. Trigeminal Neuralgia Trigeminal neuralgia is a nerve disorder that causes severe pain on one side of the face. The pain may last from a few seconds to several minutes. The pain is usually only on one side of the face. Symptoms may occur for days, weeks, or months and then  go away for months or years. The pain may return and be worse than before. What are the causes? This condition is caused by damage or pressure to a nerve in the head that is called the trigeminal nerve. An attack can be triggered by: Talking. Chewing. Putting on makeup. Washing your face. Shaving your face. Brushing your teeth. Touching your face. What increases the risk? You are more likely to develop this condition if you: Are 56 years of age or older. Are female. What are the signs or symptoms? The main symptom of this condition is severe pain in the: Jaw. Lips. Eyes. Nose. Scalp. Forehead. Face. The pain may be: Intense. Stabbing. Electric. Shock-like. How is this diagnosed? This condition is diagnosed with a physical exam. A CT scan or an MRI may be done to rule out other conditions that  can cause facial pain. How is this treated? This condition may be treated with: Avoiding the things that trigger your symptoms. Taking prescription medicines (anticonvulsants). Having surgery. This may be done in severe cases if other medical treatment does not provide relief. Having procedures such as ablation, thermal, or radiation therapy. It may take up to one month for treatment to start relieving the pain. Follow these instructions at home: Managing pain Learn as much as you can about how to manage your pain. Ask your health care provider if a pain specialist would be helpful. Consider talking with a mental health care provider (psychologist) about how to cope with the pain. Consider joining a pain support group. General instructions Take over-the-counter and prescription medicines only as told by your health care provider. Avoid the things that trigger your symptoms. It may help to: Chew on the unaffected side of your mouth. Avoid touching your face. Avoid blasts of hot or cold air. Follow your treatment plan as told by your health care provider. This may  include: Cognitive or behavioral therapy. Gentle, regular exercise. Meditation or yoga. Aromatherapy. Keep all follow-up visits as told by your health care provider. You may need to be monitored closely to make sure treatment is working well for you. Where to find more information [employer] Association: fpa-support.org Contact a health care provider if: Your medicine is not helping your symptoms. You have side effects from the medicine used for treatment. You develop new, unexplained symptoms, such as: Double vision. Facial weakness. Facial numbness. Changes in hearing or balance. You feel depressed. Get help right away if: Your pain is severe and is not getting better. You develop suicidal thoughts. If you ever feel like you may hurt yourself or others, or have thoughts about taking your own life, get help right away. You can go to your nearest emergency department or call: Your local emergency services (911 in the [place] A suicide crisis helpline, such as the National Suicide Prevention Lifeline at 2628833755. This is open 24 hours a day. Summary Trigeminal neuralgia is a nerve disorder that causes severe pain on one side of the face. The pain may last from a few seconds to several minutes. This condition is caused by damage or pressure to a nerve in the head that is called the trigeminal nerve. Treatment may include avoiding the things that trigger your symptoms, taking medicines, or having surgery or procedures. It may take up to one month for treatment to start relieving the pain. Avoid the things that trigger your symptoms. Keep all follow-up visits as told by your health care provider. You may need to be monitored closely to make sure treatment is working well for you. This information is not intended to replace advice given to you by your health care provider. Make sure you discuss any questions you have with your health care provider. Document Revised: 08/21/2018 Document  Reviewed: 08/21/2018 Elsevier Patient Education  2022 Else[employer] Inc.   Carpal Tunnel Syndrome Carpal tunnel syndrome is a condition that causes pain, numbness, and weakness in your hand and fingers. The carpal tunnel is a narrow area located on the palm side of your wrist. Repeated wrist motion or certain diseases may cause swelling within the tunnel. This swelling pinches the main nerve in the wrist. The main nerve in the wrist is called the median nerve. What are the causes? This condition may be caused by: Repeated and forceful wrist and hand motions. Wrist injuries. Arthritis. A cyst or tumor in the carpal  tunnel. Fluid buildup during pregnancy. Use of tools that vibrate. Sometimes the cause of this condition is not known. What increases the risk? The following factors may make you more likely to develop this condition: Having a job that requires you to repeatedly or forcefully move your wrist or hand or requires you to use tools that vibrate. This may include jobs that involve using computers, working on an [employer], or working with power tools such as Radiographer, therapeutic. Being a woman. Having certain conditions, such as: Diabetes. Obesity. An underactive thyroid (hypothyroidism). Kidney failure. Rheumatoid arthritis. What are the signs or symptoms? Symptoms of this condition include: A tingling feeling in your fingers, especially in your thumb, index, and middle fingers. Tingling or numbness in your hand. An aching feeling in your entire arm, especially when your wrist and elbow are bent for a long time. Wrist pain that goes up your arm to your shoulder. Pain that goes down into your palm or fingers. A weak feeling in your hands. You may have trouble grabbing and holding items. Your symptoms may feel worse during the night. How is this diagnosed? This condition is diagnosed with a medical history and physical exam. You may also have tests, including: Electromyogram  (EMG). This test measures electrical signals sent by your nerves into the muscles. Nerve conduction study. This test measures how well electrical signals pass through your nerves. Imaging tests, such as X-rays, ultrasound, and MRI. These tests check for possible causes of your condition. How is this treated? This condition may be treated with: Lifestyle changes. It is important to stop or change the activity that caused your condition. Doing exercise and activities to strengthen and stretch your muscles and tendons (physical therapy). Making lifestyle changes to help with your condition and learning how to do your daily activities safely (occupational therapy). Medicines for pain and inflammation. This may include medicine that is injected into your wrist. A wrist splint or brace. Surgery. Follow these instructions at home: If you have a splint or brace: Wear the splint or brace as told by your health care provider. Remove it only as told by your health care provider. Loosen the splint or brace if your fingers tingle, become numb, or turn cold and blue. Keep the splint or brace clean. If the splint or brace is not waterproof: Do not let it get wet. Cover it with a watertight covering when you take a bath or shower. Managing pain, stiffness, and swelling If directed, put ice on the painful area. To do this: If you have a removeable splint or brace, remove it as told by your health care provider. Put ice in a plastic bag. Place a towel between your skin and the bag or between the splint or brace and the bag. Leave the ice on for 20 minutes, 2-3 times a day. Do not fall asleep with the cold pack on your skin. Remove the ice if your skin turns bright red. This is very important. If you cannot feel pain, heat, or cold, you have a greater risk of damage to the area. Move your fingers often to reduce stiffness and swelling. General instructions Take over-the-counter and prescription medicines  only as told by your health care provider. Rest your wrist and hand from any activity that may be causing your pain. If your condition is work related, talk with your employer about changes that can be made, such as getting a wrist pad to use while typing. Do any exercises as told by  your health care provider, physical therapist, or occupational therapist. Keep all follow-up visits. This is important. Contact a health care provider if: You have new symptoms. Your pain is not controlled with medicines. Your symptoms get worse. Get help right away if: You have severe numbness or tingling in your wrist or hand. Summary Carpal tunnel syndrome is a condition that causes pain, numbness, and weakness in your hand and fingers. It is usually caused by repeated wrist motions. Lifestyle changes and medicines are used to treat carpal tunnel syndrome. Surgery may be recommended. Follow your health care provider's instructions about wearing a splint, resting from activity, keeping follow-up visits, and calling for help. This information is not intended to replace advice given to you by your health care provider. Make sure you discuss any questions you have with your health care provider. Document Revised: 02/14/2020 Document Reviewed: 02/14/2020 Elsevier Patient Education  2022 [employer].

## 2021-07-20 DIAGNOSIS — M25562 Pain in left knee: Secondary | ICD-10-CM | POA: Diagnosis not present

## 2021-07-20 DIAGNOSIS — M546 Pain in thoracic spine: Secondary | ICD-10-CM | POA: Diagnosis not present

## 2021-07-20 DIAGNOSIS — G894 Chronic pain syndrome: Secondary | ICD-10-CM | POA: Diagnosis not present

## 2021-07-20 DIAGNOSIS — M25561 Pain in right knee: Secondary | ICD-10-CM | POA: Diagnosis not present

## 2021-07-20 DIAGNOSIS — Z79891 Long term (current) use of opiate analgesic: Secondary | ICD-10-CM | POA: Diagnosis not present

## 2021-07-20 DIAGNOSIS — M545 Low back pain, unspecified: Secondary | ICD-10-CM | POA: Diagnosis not present

## 2021-07-20 DIAGNOSIS — M542 Cervicalgia: Secondary | ICD-10-CM | POA: Diagnosis not present

## 2021-07-28 ENCOUNTER — Other Ambulatory Visit: Payer: Self-pay | Admitting: Physician Assistant

## 2021-07-28 DIAGNOSIS — Z1231 Encounter for screening mammogram for malignant neoplasm of breast: Secondary | ICD-10-CM

## 2021-07-29 ENCOUNTER — Other Ambulatory Visit: Payer: Self-pay | Admitting: Anesthesiology

## 2021-07-29 DIAGNOSIS — M545 Low back pain, unspecified: Secondary | ICD-10-CM

## 2021-07-29 DIAGNOSIS — M4327 Fusion of spine, lumbosacral region: Secondary | ICD-10-CM

## 2021-07-29 DIAGNOSIS — M5136 Other intervertebral disc degeneration, lumbar region: Secondary | ICD-10-CM

## 2021-07-29 DIAGNOSIS — M51369 Other intervertebral disc degeneration, lumbar region without mention of lumbar back pain or lower extremity pain: Secondary | ICD-10-CM

## 2021-07-30 ENCOUNTER — Ambulatory Visit: Payer: Medicare Other

## 2021-08-13 ENCOUNTER — Ambulatory Visit (INDEPENDENT_AMBULATORY_CARE_PROVIDER_SITE_OTHER): Payer: Medicare Other | Admitting: Neurology

## 2021-08-13 ENCOUNTER — Ambulatory Visit
Admission: RE | Admit: 2021-08-13 | Discharge: 2021-08-13 | Disposition: A | Discharge status: Home / Self Care | Payer: Medicare Other | Source: Ambulatory Visit | Attending: Physician Assistant | Admitting: Physician Assistant

## 2021-08-13 ENCOUNTER — Ambulatory Visit: Admission: RE | Admit: 2021-08-13 | Payer: Medicare Other | Source: Ambulatory Visit

## 2021-08-13 ENCOUNTER — Other Ambulatory Visit: Payer: Self-pay

## 2021-08-13 DIAGNOSIS — R200 Anesthesia of skin: Secondary | ICD-10-CM

## 2021-08-13 DIAGNOSIS — Z1231 Encounter for screening mammogram for malignant neoplasm of breast: Secondary | ICD-10-CM

## 2021-08-13 DIAGNOSIS — R202 Paresthesia of skin: Secondary | ICD-10-CM

## 2021-08-13 DIAGNOSIS — G5602 Carpal tunnel syndrome, left upper limb: Secondary | ICD-10-CM

## 2021-08-13 DIAGNOSIS — Z0289 Encounter for other administrative examinations: Secondary | ICD-10-CM

## 2021-08-13 NOTE — Progress Notes (Signed)
Full Name: Jo Allen Gender: Female MRN #: [id number] Date of Birth: 1966/08/19    Visit Date: 08/13/2021 07:43 Age: 55 Years Examining Physician: Naomie Dean, MD  Requesting Provider: Antony Madura, PA-C Primary Care Provider:  Norm Salt, PA Height: 5 feet 4 inch, 233lbs Patient History: Patient with left hand pain Summary: Nerve Conduction Studies were performed on the bilateral upper extremities:  The left median APB motor nerve showed prolonged distal onset latency (5.1 ms, N<4.4). The left  Median 2nd Digit orthodromic sensory nerve showed prolonged distal peak latency (4.8 ms, N<3.4) and reduced amplitude(6 uV, N>10).The right median/ulnar (palm) comparison nerve showed prolonged distal peak latency (Median Palm, 2.5 ms, N<2.2) and abnormal peak latency difference (Median Palm-Ulnar Palm, 0.7 ms, N<0.4) with a relative median delay.  The left median/ulnar (palm) comparison nerve showed prolonged distal peak latency (Median Palm, 3.7 ms, N<2.2) and abnormal peak latency difference (Median Palm-Ulnar Palm, 2.0 ms, N<0.4) with a relative median delay.  All remaining nerves (as indicated in the following tables) were within normal limits.  EMG performed on the left upper extremity: All muscles (as indicated in the following tables) were within normal limits.     Conclusion:  There is electrophysiologic evidence of moderately-severe left Carpal Tunnel Syndrome.  There may be mild/early right Carpal Tunnel as well. No suggestion of polyneuropathy or radiculopathy. Patient has established care at [facility], please refer there for Carpal Tunnel Syndrome, needs Carpal Tunnel Release on left.   ------------------------------- Naomie Dean, M.D.  [facility] [address] [place], [place] [address] Tel: [phone] Fax: [phone]  Verbal informed consent was obtained from the patient, patient was informed of potential risk of procedure,  including bruising, bleeding, hematoma formation, infection, muscle weakness, muscle pain, numbness, among others.        MNC    Nerve / Sites Muscle Latency Ref. Amplitude Ref. Rel Amp Segments Distance Velocity Ref. Area    ms ms mV mV %  cm m/s m/s mVms  L Median - APB     Wrist APB 5.1 ?4.4 6.2 ?4.0 100 Wrist - APB 7   19.7     Upper arm APB 9.6  5.7  90.9 Upper arm - Wrist 22 49 ?49 19.6  R Median - APB     Wrist APB 3.9 ?4.4 6.0 ?4.0 100 Wrist - APB 7   18.4     Upper arm APB 8.2  5.5  90.5 Upper arm - Wrist 23 53 ?49 18.8  L Ulnar - ADM     Wrist ADM 2.2 ?3.3 9.2 ?6.0 100 Wrist - ADM 7   29.3     B.Elbow ADM 5.7  9.1  99.1 B.Elbow - Wrist 19 54 ?49 29.1     A.Elbow ADM 7.5  8.9  97.7 A.Elbow - B.Elbow 10 55 ?49 28.5           SNC    Nerve / Sites Rec. Site Peak Lat Ref.  Amp Ref. Segments Distance Peak Diff Ref.    ms ms V V  cm ms ms  L Median, Ulnar - Transcarpal comparison     Median Palm Wrist 3.7 ?2.2 22 ?35 Median Palm - Wrist 8       Ulnar Palm Wrist 1.7 ?2.2 18 ?12 Ulnar Palm - Wrist 8          Median Palm - Ulnar Palm  2.0 ?0.4  R Median, Ulnar -  Transcarpal comparison     Median Palm Wrist 2.5 ?2.2 45 ?35 Median Palm - Wrist 8       Ulnar Palm Wrist 1.8 ?2.2 18 ?12 Ulnar Palm - Wrist 8          Median Palm - Ulnar Palm  0.7 ?0.4  L Median - Orthodromic (Dig II, Mid palm)     Dig II Wrist 4.8 ?3.4 6 ?10 Dig II - Wrist 13    R Median - Orthodromic (Dig II, Mid palm)     Dig II Wrist 3.2 ?3.4 11 ?10 Dig II - Wrist 13    L Ulnar - Orthodromic, (Dig V, Mid palm)     Dig V Wrist 2.2 ?3.1 8 ?5 Dig V - Wrist 11      EMG Summary Table    Spontaneous MUAP Recruitment  Muscle IA Fib PSW Fasc Other Amp Dur. Poly Pattern  L. Deltoid Normal None None None _______ Normal Normal Normal Normal  L. Triceps brachii Normal None None None _______ Normal Normal Normal Normal  L. Pronator teres Normal None None None _______ Normal Normal Normal Normal  L. First dorsal  interosseous Normal None None None _______ Normal Normal Normal Normal  L. Opponens pollicis Normal None None None _______ Normal Normal Normal Normal  L. Cervical paraspinals (low) Normal None None None _______ Normal Normal Normal Normal

## 2021-08-16 NOTE — Progress Notes (Signed)
See procedure note.

## 2021-08-17 DIAGNOSIS — F4541 Pain disorder exclusively related to psychological factors: Secondary | ICD-10-CM | POA: Diagnosis not present

## 2021-08-18 DIAGNOSIS — M546 Pain in thoracic spine: Secondary | ICD-10-CM | POA: Diagnosis not present

## 2021-08-18 DIAGNOSIS — G894 Chronic pain syndrome: Secondary | ICD-10-CM | POA: Diagnosis not present

## 2021-08-18 DIAGNOSIS — M545 Low back pain, unspecified: Secondary | ICD-10-CM | POA: Diagnosis not present

## 2021-08-18 DIAGNOSIS — M25562 Pain in left knee: Secondary | ICD-10-CM | POA: Diagnosis not present

## 2021-08-18 DIAGNOSIS — M25561 Pain in right knee: Secondary | ICD-10-CM | POA: Diagnosis not present

## 2021-08-18 DIAGNOSIS — M542 Cervicalgia: Secondary | ICD-10-CM | POA: Diagnosis not present

## 2021-08-18 DIAGNOSIS — Z79891 Long term (current) use of opiate analgesic: Secondary | ICD-10-CM | POA: Diagnosis not present

## 2021-08-18 NOTE — Procedures (Signed)
Full Name: Jo Allen Gender: Female MRN #: [id number] Date of Birth: 1966/08/19    Visit Date: 08/13/2021 07:43 Age: 55 Years Examining Physician: Naomie Dean, MD  Requesting Provider: Antony Madura, PA-C Primary Care Provider:  Norm Salt, PA Height: 5 feet 4 inch, 233lbs Patient History: Patient with left hand pain Summary: Nerve Conduction Studies were performed on the bilateral upper extremities:  The left median APB motor nerve showed prolonged distal onset latency (5.1 ms, N<4.4). The left  Median 2nd Digit orthodromic sensory nerve showed prolonged distal peak latency (4.8 ms, N<3.4) and reduced amplitude(6 uV, N>10).The right median/ulnar (palm) comparison nerve showed prolonged distal peak latency (Median Palm, 2.5 ms, N<2.2) and abnormal peak latency difference (Median Palm-Ulnar Palm, 0.7 ms, N<0.4) with a relative median delay.  The left median/ulnar (palm) comparison nerve showed prolonged distal peak latency (Median Palm, 3.7 ms, N<2.2) and abnormal peak latency difference (Median Palm-Ulnar Palm, 2.0 ms, N<0.4) with a relative median delay.  All remaining nerves (as indicated in the following tables) were within normal limits.  EMG performed on the left upper extremity: All muscles (as indicated in the following tables) were within normal limits.     Conclusion:  There is electrophysiologic evidence of moderately-severe left Carpal Tunnel Syndrome.  There may be mild/early right Carpal Tunnel as well. No suggestion of polyneuropathy or radiculopathy. Patient has established care at [facility], please refer there for Carpal Tunnel Syndrome, needs Carpal Tunnel Release on left.   ------------------------------- Naomie Dean, M.D.  [facility] [address] [place], [place] [address] Tel: [phone] Fax: [phone]  Verbal informed consent was obtained from the patient, patient was informed of potential risk of procedure,  including bruising, bleeding, hematoma formation, infection, muscle weakness, muscle pain, numbness, among others.        MNC    Nerve / Sites Muscle Latency Ref. Amplitude Ref. Rel Amp Segments Distance Velocity Ref. Area    ms ms mV mV %  cm m/s m/s mVms  L Median - APB     Wrist APB 5.1 ?4.4 6.2 ?4.0 100 Wrist - APB 7   19.7     Upper arm APB 9.6  5.7  90.9 Upper arm - Wrist 22 49 ?49 19.6  R Median - APB     Wrist APB 3.9 ?4.4 6.0 ?4.0 100 Wrist - APB 7   18.4     Upper arm APB 8.2  5.5  90.5 Upper arm - Wrist 23 53 ?49 18.8  L Ulnar - ADM     Wrist ADM 2.2 ?3.3 9.2 ?6.0 100 Wrist - ADM 7   29.3     B.Elbow ADM 5.7  9.1  99.1 B.Elbow - Wrist 19 54 ?49 29.1     A.Elbow ADM 7.5  8.9  97.7 A.Elbow - B.Elbow 10 55 ?49 28.5           SNC    Nerve / Sites Rec. Site Peak Lat Ref.  Amp Ref. Segments Distance Peak Diff Ref.    ms ms V V  cm ms ms  L Median, Ulnar - Transcarpal comparison     Median Palm Wrist 3.7 ?2.2 22 ?35 Median Palm - Wrist 8       Ulnar Palm Wrist 1.7 ?2.2 18 ?12 Ulnar Palm - Wrist 8          Median Palm - Ulnar Palm  2.0 ?0.4  R Median, Ulnar -  Transcarpal comparison     Median Palm Wrist 2.5 ?2.2 45 ?35 Median Palm - Wrist 8       Ulnar Palm Wrist 1.8 ?2.2 18 ?12 Ulnar Palm - Wrist 8          Median Palm - Ulnar Palm  0.7 ?0.4  L Median - Orthodromic (Dig II, Mid palm)     Dig II Wrist 4.8 ?3.4 6 ?10 Dig II - Wrist 13    R Median - Orthodromic (Dig II, Mid palm)     Dig II Wrist 3.2 ?3.4 11 ?10 Dig II - Wrist 13    L Ulnar - Orthodromic, (Dig V, Mid palm)     Dig V Wrist 2.2 ?3.1 8 ?5 Dig V - Wrist 11      EMG Summary Table    Spontaneous MUAP Recruitment  Muscle IA Fib PSW Fasc Other Amp Dur. Poly Pattern  L. Deltoid Normal None None None _______ Normal Normal Normal Normal  L. Triceps brachii Normal None None None _______ Normal Normal Normal Normal  L. Pronator teres Normal None None None _______ Normal Normal Normal Normal  L. First dorsal  interosseous Normal None None None _______ Normal Normal Normal Normal  L. Opponens pollicis Normal None None None _______ Normal Normal Normal Normal  L. Cervical paraspinals (low) Normal None None None _______ Normal Normal Normal Normal       

## 2021-08-19 ENCOUNTER — Telehealth: Payer: Self-pay | Admitting: Neurology

## 2021-08-19 NOTE — Telephone Encounter (Signed)
Ortho referral sent to [facility]. Phone: [phone].

## 2021-08-20 ENCOUNTER — Other Ambulatory Visit: Payer: Medicare Other

## 2021-08-26 ENCOUNTER — Other Ambulatory Visit: Payer: Self-pay

## 2021-08-26 ENCOUNTER — Ambulatory Visit
Admission: RE | Admit: 2021-08-26 | Discharge: 2021-08-26 | Disposition: A | Discharge status: Home / Self Care | Payer: Medicare Other | Source: Ambulatory Visit | Attending: Anesthesiology | Admitting: Anesthesiology

## 2021-08-26 DIAGNOSIS — M4327 Fusion of spine, lumbosacral region: Secondary | ICD-10-CM

## 2021-08-26 DIAGNOSIS — M545 Low back pain, unspecified: Secondary | ICD-10-CM

## 2021-08-26 DIAGNOSIS — M5136 Other intervertebral disc degeneration, lumbar region: Secondary | ICD-10-CM

## 2021-08-26 DIAGNOSIS — M51369 Other intervertebral disc degeneration, lumbar region without mention of lumbar back pain or lower extremity pain: Secondary | ICD-10-CM

## 2021-08-27 DIAGNOSIS — R7989 Other specified abnormal findings of blood chemistry: Secondary | ICD-10-CM | POA: Diagnosis not present

## 2021-08-27 DIAGNOSIS — R7303 Prediabetes: Secondary | ICD-10-CM | POA: Diagnosis not present

## 2021-08-27 DIAGNOSIS — R635 Abnormal weight gain: Secondary | ICD-10-CM | POA: Diagnosis not present

## 2021-08-27 DIAGNOSIS — F3341 Major depressive disorder, recurrent, in partial remission: Secondary | ICD-10-CM | POA: Diagnosis not present

## 2021-08-27 DIAGNOSIS — E782 Mixed hyperlipidemia: Secondary | ICD-10-CM | POA: Diagnosis not present

## 2021-08-27 DIAGNOSIS — M1990 Unspecified osteoarthritis, unspecified site: Secondary | ICD-10-CM | POA: Diagnosis not present

## 2021-08-27 DIAGNOSIS — I2510 Atherosclerotic heart disease of native coronary artery without angina pectoris: Secondary | ICD-10-CM | POA: Diagnosis not present

## 2021-08-27 DIAGNOSIS — I119 Hypertensive heart disease without heart failure: Secondary | ICD-10-CM | POA: Diagnosis not present

## 2021-08-27 DIAGNOSIS — R102 Pelvic and perineal pain: Secondary | ICD-10-CM | POA: Diagnosis not present

## 2021-08-27 DIAGNOSIS — I10 Essential (primary) hypertension: Secondary | ICD-10-CM | POA: Diagnosis not present

## 2021-08-28 DIAGNOSIS — G5602 Carpal tunnel syndrome, left upper limb: Secondary | ICD-10-CM | POA: Diagnosis not present

## 2021-08-28 DIAGNOSIS — G5603 Carpal tunnel syndrome, bilateral upper limbs: Secondary | ICD-10-CM | POA: Diagnosis not present

## 2021-09-07 DIAGNOSIS — F410 Panic disorder [episodic paroxysmal anxiety] without agoraphobia: Secondary | ICD-10-CM | POA: Diagnosis not present

## 2021-09-07 DIAGNOSIS — F319 Bipolar disorder, unspecified: Secondary | ICD-10-CM | POA: Diagnosis not present

## 2021-09-15 DIAGNOSIS — Z79899 Other long term (current) drug therapy: Secondary | ICD-10-CM | POA: Diagnosis not present

## 2021-09-15 DIAGNOSIS — Z79891 Long term (current) use of opiate analgesic: Secondary | ICD-10-CM | POA: Diagnosis not present

## 2021-09-15 DIAGNOSIS — Z6841 Body Mass Index (BMI) 40.0 and over, adult: Secondary | ICD-10-CM | POA: Diagnosis not present

## 2021-09-15 DIAGNOSIS — M542 Cervicalgia: Secondary | ICD-10-CM | POA: Diagnosis not present

## 2021-09-15 DIAGNOSIS — M546 Pain in thoracic spine: Secondary | ICD-10-CM | POA: Diagnosis not present

## 2021-09-15 DIAGNOSIS — M25561 Pain in right knee: Secondary | ICD-10-CM | POA: Diagnosis not present

## 2021-09-15 DIAGNOSIS — M545 Low back pain, unspecified: Secondary | ICD-10-CM | POA: Diagnosis not present

## 2021-09-15 DIAGNOSIS — M25562 Pain in left knee: Secondary | ICD-10-CM | POA: Diagnosis not present

## 2021-09-15 DIAGNOSIS — G894 Chronic pain syndrome: Secondary | ICD-10-CM | POA: Diagnosis not present

## 2021-10-13 DIAGNOSIS — M25561 Pain in right knee: Secondary | ICD-10-CM | POA: Diagnosis not present

## 2021-10-13 DIAGNOSIS — Z79899 Other long term (current) drug therapy: Secondary | ICD-10-CM | POA: Diagnosis not present

## 2021-10-13 DIAGNOSIS — Z79891 Long term (current) use of opiate analgesic: Secondary | ICD-10-CM | POA: Diagnosis not present

## 2021-10-13 DIAGNOSIS — M25562 Pain in left knee: Secondary | ICD-10-CM | POA: Diagnosis not present

## 2021-10-13 DIAGNOSIS — M545 Low back pain, unspecified: Secondary | ICD-10-CM | POA: Diagnosis not present

## 2021-10-13 DIAGNOSIS — M542 Cervicalgia: Secondary | ICD-10-CM | POA: Diagnosis not present

## 2021-10-13 DIAGNOSIS — Z6841 Body Mass Index (BMI) 40.0 and over, adult: Secondary | ICD-10-CM | POA: Diagnosis not present

## 2021-10-13 DIAGNOSIS — M546 Pain in thoracic spine: Secondary | ICD-10-CM | POA: Diagnosis not present

## 2021-10-13 DIAGNOSIS — G894 Chronic pain syndrome: Secondary | ICD-10-CM | POA: Diagnosis not present

## 2021-10-13 DIAGNOSIS — R030 Elevated blood-pressure reading, without diagnosis of hypertension: Secondary | ICD-10-CM | POA: Diagnosis not present

## 2021-10-22 DIAGNOSIS — F3341 Major depressive disorder, recurrent, in partial remission: Secondary | ICD-10-CM | POA: Diagnosis not present

## 2021-10-22 DIAGNOSIS — I10 Essential (primary) hypertension: Secondary | ICD-10-CM | POA: Diagnosis not present

## 2021-10-22 DIAGNOSIS — M1990 Unspecified osteoarthritis, unspecified site: Secondary | ICD-10-CM | POA: Diagnosis not present

## 2021-10-22 DIAGNOSIS — R7303 Prediabetes: Secondary | ICD-10-CM | POA: Diagnosis not present

## 2021-10-22 DIAGNOSIS — Z0001 Encounter for general adult medical examination with abnormal findings: Secondary | ICD-10-CM | POA: Diagnosis not present

## 2021-10-22 DIAGNOSIS — R7989 Other specified abnormal findings of blood chemistry: Secondary | ICD-10-CM | POA: Diagnosis not present

## 2021-10-22 DIAGNOSIS — E782 Mixed hyperlipidemia: Secondary | ICD-10-CM | POA: Diagnosis not present

## 2021-10-22 DIAGNOSIS — I119 Hypertensive heart disease without heart failure: Secondary | ICD-10-CM | POA: Diagnosis not present

## 2021-10-22 DIAGNOSIS — I2510 Atherosclerotic heart disease of native coronary artery without angina pectoris: Secondary | ICD-10-CM | POA: Diagnosis not present

## 2021-10-25 DIAGNOSIS — Z20822 Contact with and (suspected) exposure to covid-19: Secondary | ICD-10-CM | POA: Diagnosis not present

## 2021-11-10 DIAGNOSIS — M25561 Pain in right knee: Secondary | ICD-10-CM | POA: Diagnosis not present

## 2021-11-10 DIAGNOSIS — M545 Low back pain, unspecified: Secondary | ICD-10-CM | POA: Diagnosis not present

## 2021-11-10 DIAGNOSIS — Z6841 Body Mass Index (BMI) 40.0 and over, adult: Secondary | ICD-10-CM | POA: Diagnosis not present

## 2021-11-10 DIAGNOSIS — M542 Cervicalgia: Secondary | ICD-10-CM | POA: Diagnosis not present

## 2021-11-10 DIAGNOSIS — Z79891 Long term (current) use of opiate analgesic: Secondary | ICD-10-CM | POA: Diagnosis not present

## 2021-11-10 DIAGNOSIS — M546 Pain in thoracic spine: Secondary | ICD-10-CM | POA: Diagnosis not present

## 2021-11-10 DIAGNOSIS — G894 Chronic pain syndrome: Secondary | ICD-10-CM | POA: Diagnosis not present

## 2021-11-10 DIAGNOSIS — Z79899 Other long term (current) drug therapy: Secondary | ICD-10-CM | POA: Diagnosis not present

## 2021-11-10 DIAGNOSIS — M25562 Pain in left knee: Secondary | ICD-10-CM | POA: Diagnosis not present

## 2021-11-24 DIAGNOSIS — F319 Bipolar disorder, unspecified: Secondary | ICD-10-CM | POA: Diagnosis not present

## 2021-11-24 DIAGNOSIS — F410 Panic disorder [episodic paroxysmal anxiety] without agoraphobia: Secondary | ICD-10-CM | POA: Diagnosis not present

## 2021-12-02 DIAGNOSIS — R7303 Prediabetes: Secondary | ICD-10-CM | POA: Diagnosis not present

## 2021-12-02 DIAGNOSIS — I119 Hypertensive heart disease without heart failure: Secondary | ICD-10-CM | POA: Diagnosis not present

## 2021-12-02 DIAGNOSIS — I2510 Atherosclerotic heart disease of native coronary artery without angina pectoris: Secondary | ICD-10-CM | POA: Diagnosis not present

## 2021-12-02 DIAGNOSIS — F3341 Major depressive disorder, recurrent, in partial remission: Secondary | ICD-10-CM | POA: Diagnosis not present

## 2021-12-02 DIAGNOSIS — M1990 Unspecified osteoarthritis, unspecified site: Secondary | ICD-10-CM | POA: Diagnosis not present

## 2021-12-02 DIAGNOSIS — E782 Mixed hyperlipidemia: Secondary | ICD-10-CM | POA: Diagnosis not present

## 2021-12-02 DIAGNOSIS — I10 Essential (primary) hypertension: Secondary | ICD-10-CM | POA: Diagnosis not present

## 2021-12-02 DIAGNOSIS — R7989 Other specified abnormal findings of blood chemistry: Secondary | ICD-10-CM | POA: Diagnosis not present

## 2021-12-02 DIAGNOSIS — M5442 Lumbago with sciatica, left side: Secondary | ICD-10-CM | POA: Diagnosis not present

## 2021-12-10 DIAGNOSIS — M25562 Pain in left knee: Secondary | ICD-10-CM | POA: Diagnosis not present

## 2021-12-10 DIAGNOSIS — G894 Chronic pain syndrome: Secondary | ICD-10-CM | POA: Diagnosis not present

## 2021-12-10 DIAGNOSIS — M545 Low back pain, unspecified: Secondary | ICD-10-CM | POA: Diagnosis not present

## 2021-12-10 DIAGNOSIS — Z79891 Long term (current) use of opiate analgesic: Secondary | ICD-10-CM | POA: Diagnosis not present

## 2021-12-10 DIAGNOSIS — M546 Pain in thoracic spine: Secondary | ICD-10-CM | POA: Diagnosis not present

## 2021-12-10 DIAGNOSIS — M25561 Pain in right knee: Secondary | ICD-10-CM | POA: Diagnosis not present

## 2021-12-10 DIAGNOSIS — M542 Cervicalgia: Secondary | ICD-10-CM | POA: Diagnosis not present

## 2021-12-14 DIAGNOSIS — Z79899 Other long term (current) drug therapy: Secondary | ICD-10-CM | POA: Diagnosis not present

## 2021-12-14 DIAGNOSIS — Z6841 Body Mass Index (BMI) 40.0 and over, adult: Secondary | ICD-10-CM | POA: Diagnosis not present

## 2021-12-17 DIAGNOSIS — Z20822 Contact with and (suspected) exposure to covid-19: Secondary | ICD-10-CM | POA: Diagnosis not present

## 2022-01-03 ENCOUNTER — Ambulatory Visit
Admission: EM | Admit: 2022-01-03 | Discharge: 2022-01-03 | Disposition: A | Discharge status: Home / Self Care | Payer: Medicare Other | Attending: Physician Assistant | Admitting: Physician Assistant

## 2022-01-03 ENCOUNTER — Other Ambulatory Visit: Payer: Self-pay

## 2022-01-03 DIAGNOSIS — J45901 Unspecified asthma with (acute) exacerbation: Secondary | ICD-10-CM | POA: Diagnosis not present

## 2022-01-03 MED ORDER — AZITHROMYCIN 250 MG PO TABS: 250.0000 mg | ORAL_TABLET | Freq: Every day | ORAL | 0 refills | Status: DC

## 2022-01-03 MED ORDER — PREDNISONE 20 MG PO TABS: 40.0000 mg | ORAL_TABLET | Freq: Every day | ORAL | 0 refills | Status: AC

## 2022-01-03 MED ORDER — ALBUTEROL SULFATE HFA 108 (90 BASE) MCG/ACT IN AERS: 1.0000 | INHALATION_SPRAY | Freq: Four times a day (QID) | RESPIRATORY_TRACT | 0 refills | Status: AC | PRN

## 2022-01-03 NOTE — ED Provider Notes (Signed)
?[facility] ? ? ? ?CSN: [id number] ?Arrival date & time: 01/03/22  1435 ? ? ?  ? ?History   ?Chief Complaint ?Chief Complaint  ?Patient presents with  ? URI  ? ? ?HPI ?Jo Allen is a 56 y.o. female.  ? ?Patient here today for evaluation of nasal drainage, headache, chest congestion and cough she has had the last week. She reports she has had hoarseness as well. Symptoms do not seem to be improving. She does have history of asthma but is not sure if her albuterol inhaler is still good. She has not had vomiting or diarrhea. She has felt feverish but has not checked her temperature. She has not taken any OTC meds for treatment.  ? ?The history is provided by the patient.  ? ?Past Medical History:  ?Diagnosis Date  ? Asthma   ? CAD (coronary artery disease)   ? GERD (gastroesophageal reflux disease)   ? Headache   ? Heart disease   ? Hypertension   ? Obesity   ? Paresthesia 08/20/2015  ? Prediabetes   ? Transfusion history   ? 8yrs ago  ? ? ?Patient Active Problem List  ? Diagnosis Date Noted  ? Numbness and tingling in left hand 07/09/2021  ? Chronic back pain 07/09/2021  ? Bilateral headaches 07/09/2021  ? Trigeminal neuralgia of left side of face 07/09/2021  ? Other chronic pain 07/09/2021  ? Paresthesia 08/20/2015  ? ? ?Past Surgical History:  ?Procedure Laterality Date  ? ANGIOPLASTY    ? BREAST BIOPSY Left   ? BREAST BIOPSY WITH SENTINEL LYMPH NODE BIOPSY AND NEEDLE LOCALIZATION Left   ? COLONOSCOPY WITH PROPOFOL N/A 02/19/2014  ? Procedure: COLONOSCOPY WITH PROPOFOL;  Surgeon: Charolett Bumpers, MD;  Location: [facility];  Service: Endoscopy;  Laterality: N/A;  ? JOINT REPLACEMENT Bilateral   ? TOTAL KNEE ARTHROPLASTY Bilateral   ? ? ?OB History   ?No obstetric history on file. ?  ? ? ? ?Home Medications   ? ?Prior to Admission medications   ?Medication Sig Start Date End Date Taking? Authorizing Provider  ?azithromycin (ZITHROMAX) 250 MG tablet Take 1 tablet (250 mg total) by mouth daily. Take  first 2 tablets together, then 1 every day until finished. 01/03/22  Yes Tomi Bamberger, PA-C  ?predniSONE (DELTASONE) 20 MG tablet Take 2 tablets (40 mg total) by mouth daily with breakfast for 5 days. 01/03/22 01/08/22 Yes Tomi Bamberger, PA-C  ?acetaminophen (TYLENOL 8 HOUR) 650 MG CR tablet Take 1 tablet (650 mg total) by mouth every 8 (eight) hours as needed. 12/03/19   Derwood Kaplan, MD  ?albuterol (VENTOLIN HFA) 108 (90 Base) MCG/ACT inhaler Inhale 1-2 puffs into the lungs every 6 (six) hours as needed for wheezing or shortness of breath. 01/03/22   Tomi Bamberger, PA-C  ?Biotin 1000 MCG CHEW Chew 5,000 mcg by mouth daily.    [provider]  ?buPROPion (WELLBUTRIN XL) 150 MG 24 hr tablet Take 150 mg by mouth 3 (three) times daily. 11/22/19   [provider]  ?celecoxib (CELEBREX) 100 MG capsule Take 1-2 capsules (100-200 mg total) by mouth 2 (two) times daily. 02/01/21   Wieters, Junius Creamer, PA-C  ?Cholecalciferol (VITAMIN D3) 25 MCG (1000 UT) CAPS Take 2,000 Units by mouth daily.    [provider]  ?CRANBERRY PO Take 1 tablet by mouth daily.    [provider]  ?doxepin (SINEQUAN) 25 MG capsule Take 25-50 mg by mouth at bedtime as needed (  sleep aid).  11/22/19   [provider]  ?DULoxetine (CYMBALTA) 30 MG capsule Take 30 mg by mouth 2 (two) times daily. 10/20/19   [provider]  ?ferrous sulfate 325 (65 FE) MG EC tablet Take 325 mg by mouth daily with breakfast.    [provider]  ?Ginkgo 60 MG TABS Take 60 mg by mouth daily.    [provider]  ?hydrochlorothiazide (HYDRODIURIL) 25 MG tablet Take 25 mg by mouth daily.    [provider]  ?ibuprofen (ADVIL) 600 MG tablet Take 1 tablet (600 mg total) by mouth every 6 (six) hours as needed. 12/03/19   Derwood KaplanNanavati, Ankit, MD  ?Providence LaniusKrill Oil (OMEGA-3) 500 MG CAPS Take 500 mg by mouth daily.    [provider]  ?Lactobacillus (PROBIOTIC ACIDOPHILUS PO) Take 1 tablet by mouth daily.     [provider]  ?lamoTRIgine (LAMICTAL) 150 MG tablet Take 150 mg by mouth 2 (two) times daily. 11/22/19   [provider]  ?methocarbamol (ROBAXIN) 500 MG tablet Take 1 tablet (500 mg total) by mouth at bedtime. 10/24/19   Lorelee NewGreen, Garrett L, PA-C  ?montelukast (SINGULAIR) 10 MG tablet Take 10 mg by mouth at bedtime.    [provider]  ?Multiple Vitamins-Minerals (ONE-A-DAY WOMENS PO) Take 1 tablet by mouth daily.    [provider]  ?phentermine 37.5 MG capsule Take 37.5 mg by mouth every morning.    [provider]  ?pregabalin (LYRICA) 50 MG capsule Take 1 capsule (50 mg total) by mouth 3 (three) times daily. 07/09/21   Anson FretAhern, Antonia B, MD  ?tiZANidine (ZANAFLEX) 2 MG tablet Take 1-2 tablets (2-4 mg total) by mouth every 6 (six) hours as needed for muscle spasms. 02/01/21   Wieters, Hallie C, PA-C  ?verapamil (VERELAN PM) 360 MG 24 hr capsule Take 360 mg by mouth every morning.    [provider]  ?vitamin B-12 (CYANOCOBALAMIN) 1000 MCG tablet Take 1,000 mcg by mouth daily.    [provider]  ?vitamin E 100 UNIT capsule Take 300 Units by mouth daily.     [provider]  ? ? ?Family History ?Family History  ?Problem Relation Age of Onset  ? Hypertension Mother   ? Diabetes Mother   ? Stroke Father   ? Headache Sister   ? Depression Brother   ? Breast cancer Neg Hx   ? ? ?Social History ?Social History  ? ?Tobacco Use  ? Smoking status: Never  ? Smokeless tobacco: Never  ?Vaping Use  ? Vaping Use: Never used  ?Substance Use Topics  ? Alcohol use: No  ? Drug use: No  ? ? ? ?Allergies   ?Morphine and related, Penicillins, and Tramadol ? ? ?Review of Systems ?Review of Systems  ?Constitutional:  Negative for chills and fever.  ?HENT:  Positive for congestion and sore throat. Negative for ear pain.   ?Eyes:  Negative for discharge and redness.  ?Respiratory:  Positive for cough. Negative for shortness of breath and wheezing.   ?Gastrointestinal:   Negative for abdominal pain, diarrhea, nausea and vomiting.  ?Neurological:  Positive for headaches.  ? ? ?Physical Exam ?Triage Vital Signs ?ED Triage Vitals  ?Enc Vitals Group  ?   BP   ?   Pulse   ?   Resp   ?   Temp   ?   Temp src   ?   SpO2   ?   Weight   ?   Height   ?  Head Circumference   ?   Peak Flow   ?   Pain Score   ?   Pain Loc   ?   Pain Edu?   ?   Excl. in GC?   ? ?No data found. ? ?Updated Vital Signs ?BP 130/83 (BP Location: Left Arm)   Pulse 81   Temp 97.8 ?F (36.6 ?C) (Oral)   Resp 17   SpO2 92%  ? ?Physical Exam ?Vitals and nursing note reviewed.  ?Constitutional:   ?   General: She is not in acute distress. ?   Appearance: Normal appearance. She is not ill-appearing.  ?HENT:  ?   Head: Normocephalic and atraumatic.  ?   Nose: Congestion present.  ?   Mouth/Throat:  ?   Mouth: Mucous membranes are moist.  ?   Pharynx: No oropharyngeal exudate or posterior oropharyngeal erythema.  ?Eyes:  ?   Conjunctiva/sclera: Conjunctivae normal.  ?Cardiovascular:  ?   Rate and Rhythm: Normal rate and regular rhythm.  ?   Heart sounds: Normal heart sounds. No murmur heard. ?Pulmonary:  ?   Effort: Pulmonary effort is normal. No respiratory distress.  ?   Breath sounds: Normal breath sounds. No wheezing, rhonchi or rales.  ?   Comments: Deep breathing produces cough ?Skin: ?   General: Skin is warm and dry.  ?Neurological:  ?   Mental Status: She is alert.  ?Psychiatric:     ?   Mood and Affect: Mood normal.     ?   Thought Content: Thought content normal.  ? ? ? ?UC Treatments / Results  ?Labs ?(all labs ordered are listed, but only abnormal results are displayed) ?Labs Reviewed - No data to display ? ?EKG ? ? ?Radiology ?No results found. ? ?Procedures ?Procedures (including critical care time) ? ?Medications Ordered in UC ?Medications - No data to display ? ?Initial Impression / Assessment and Plan / UC Course  ?I have reviewed the triage vital signs and the nursing notes. ? ?Pertinent labs & imaging  results that were available during my care of the patient were reviewed by me and considered in my medical decision making (see chart for details). ? ?  ?Will treat to cover asthma exacerbation with steroid

## 2022-01-03 NOTE — ED Triage Notes (Signed)
Pt presents with nasal drainage, chest congestion, and cough X 1 week.  ?

## 2022-01-05 DIAGNOSIS — I119 Hypertensive heart disease without heart failure: Secondary | ICD-10-CM | POA: Diagnosis not present

## 2022-01-05 DIAGNOSIS — I10 Essential (primary) hypertension: Secondary | ICD-10-CM | POA: Diagnosis not present

## 2022-01-05 DIAGNOSIS — R7303 Prediabetes: Secondary | ICD-10-CM | POA: Diagnosis not present

## 2022-01-05 DIAGNOSIS — E782 Mixed hyperlipidemia: Secondary | ICD-10-CM | POA: Diagnosis not present

## 2022-01-05 DIAGNOSIS — M1990 Unspecified osteoarthritis, unspecified site: Secondary | ICD-10-CM | POA: Diagnosis not present

## 2022-01-05 DIAGNOSIS — F3341 Major depressive disorder, recurrent, in partial remission: Secondary | ICD-10-CM | POA: Diagnosis not present

## 2022-01-05 DIAGNOSIS — R051 Acute cough: Secondary | ICD-10-CM | POA: Diagnosis not present

## 2022-01-05 DIAGNOSIS — I2510 Atherosclerotic heart disease of native coronary artery without angina pectoris: Secondary | ICD-10-CM | POA: Diagnosis not present

## 2022-01-05 DIAGNOSIS — R7989 Other specified abnormal findings of blood chemistry: Secondary | ICD-10-CM | POA: Diagnosis not present

## 2022-01-07 DIAGNOSIS — F410 Panic disorder [episodic paroxysmal anxiety] without agoraphobia: Secondary | ICD-10-CM | POA: Diagnosis not present

## 2022-01-07 DIAGNOSIS — F319 Bipolar disorder, unspecified: Secondary | ICD-10-CM | POA: Diagnosis not present

## 2022-01-07 DIAGNOSIS — G5603 Carpal tunnel syndrome, bilateral upper limbs: Secondary | ICD-10-CM | POA: Diagnosis not present

## 2022-01-12 DIAGNOSIS — M5137 Other intervertebral disc degeneration, lumbosacral region: Secondary | ICD-10-CM | POA: Diagnosis not present

## 2022-01-12 DIAGNOSIS — M542 Cervicalgia: Secondary | ICD-10-CM | POA: Diagnosis not present

## 2022-01-12 DIAGNOSIS — M25561 Pain in right knee: Secondary | ICD-10-CM | POA: Diagnosis not present

## 2022-01-12 DIAGNOSIS — M25562 Pain in left knee: Secondary | ICD-10-CM | POA: Diagnosis not present

## 2022-01-12 DIAGNOSIS — G894 Chronic pain syndrome: Secondary | ICD-10-CM | POA: Diagnosis not present

## 2022-01-12 DIAGNOSIS — Z79891 Long term (current) use of opiate analgesic: Secondary | ICD-10-CM | POA: Diagnosis not present

## 2022-01-12 DIAGNOSIS — M545 Low back pain, unspecified: Secondary | ICD-10-CM | POA: Diagnosis not present

## 2022-01-13 DIAGNOSIS — R051 Acute cough: Secondary | ICD-10-CM | POA: Diagnosis not present

## 2022-01-13 DIAGNOSIS — R7303 Prediabetes: Secondary | ICD-10-CM | POA: Diagnosis not present

## 2022-01-13 DIAGNOSIS — E782 Mixed hyperlipidemia: Secondary | ICD-10-CM | POA: Diagnosis not present

## 2022-01-13 DIAGNOSIS — M1990 Unspecified osteoarthritis, unspecified site: Secondary | ICD-10-CM | POA: Diagnosis not present

## 2022-01-13 DIAGNOSIS — J018 Other acute sinusitis: Secondary | ICD-10-CM | POA: Diagnosis not present

## 2022-01-13 DIAGNOSIS — I119 Hypertensive heart disease without heart failure: Secondary | ICD-10-CM | POA: Diagnosis not present

## 2022-01-18 DIAGNOSIS — Z79899 Other long term (current) drug therapy: Secondary | ICD-10-CM | POA: Diagnosis not present

## 2022-01-18 DIAGNOSIS — Z6841 Body Mass Index (BMI) 40.0 and over, adult: Secondary | ICD-10-CM | POA: Diagnosis not present

## 2022-01-22 DIAGNOSIS — Z20822 Contact with and (suspected) exposure to covid-19: Secondary | ICD-10-CM | POA: Diagnosis not present

## 2022-02-09 DIAGNOSIS — E559 Vitamin D deficiency, unspecified: Secondary | ICD-10-CM | POA: Diagnosis not present

## 2022-02-09 DIAGNOSIS — M25562 Pain in left knee: Secondary | ICD-10-CM | POA: Diagnosis not present

## 2022-02-09 DIAGNOSIS — M25561 Pain in right knee: Secondary | ICD-10-CM | POA: Diagnosis not present

## 2022-02-09 DIAGNOSIS — M545 Low back pain, unspecified: Secondary | ICD-10-CM | POA: Diagnosis not present

## 2022-02-09 DIAGNOSIS — G894 Chronic pain syndrome: Secondary | ICD-10-CM | POA: Diagnosis not present

## 2022-02-09 DIAGNOSIS — M546 Pain in thoracic spine: Secondary | ICD-10-CM | POA: Diagnosis not present

## 2022-02-09 DIAGNOSIS — N951 Menopausal and female climacteric states: Secondary | ICD-10-CM | POA: Diagnosis not present

## 2022-02-09 DIAGNOSIS — R5383 Other fatigue: Secondary | ICD-10-CM | POA: Diagnosis not present

## 2022-02-09 DIAGNOSIS — Z79891 Long term (current) use of opiate analgesic: Secondary | ICD-10-CM | POA: Diagnosis not present

## 2022-02-09 DIAGNOSIS — M542 Cervicalgia: Secondary | ICD-10-CM | POA: Diagnosis not present

## 2022-02-18 DIAGNOSIS — Z20822 Contact with and (suspected) exposure to covid-19: Secondary | ICD-10-CM | POA: Diagnosis not present

## 2022-02-19 DIAGNOSIS — G5602 Carpal tunnel syndrome, left upper limb: Secondary | ICD-10-CM | POA: Diagnosis not present

## 2022-02-19 DIAGNOSIS — G5603 Carpal tunnel syndrome, bilateral upper limbs: Secondary | ICD-10-CM | POA: Diagnosis not present

## 2022-02-19 DIAGNOSIS — G5601 Carpal tunnel syndrome, right upper limb: Secondary | ICD-10-CM | POA: Diagnosis not present

## 2022-02-25 DIAGNOSIS — Z20822 Contact with and (suspected) exposure to covid-19: Secondary | ICD-10-CM | POA: Diagnosis not present

## 2022-02-25 DIAGNOSIS — R059 Cough, unspecified: Secondary | ICD-10-CM | POA: Diagnosis not present

## 2022-02-25 DIAGNOSIS — R051 Acute cough: Secondary | ICD-10-CM | POA: Diagnosis not present

## 2022-03-09 DIAGNOSIS — M5137 Other intervertebral disc degeneration, lumbosacral region: Secondary | ICD-10-CM | POA: Diagnosis not present

## 2022-03-09 DIAGNOSIS — M542 Cervicalgia: Secondary | ICD-10-CM | POA: Diagnosis not present

## 2022-03-09 DIAGNOSIS — M25561 Pain in right knee: Secondary | ICD-10-CM | POA: Diagnosis not present

## 2022-03-09 DIAGNOSIS — M25562 Pain in left knee: Secondary | ICD-10-CM | POA: Diagnosis not present

## 2022-03-09 DIAGNOSIS — G894 Chronic pain syndrome: Secondary | ICD-10-CM | POA: Diagnosis not present

## 2022-03-09 DIAGNOSIS — Z79891 Long term (current) use of opiate analgesic: Secondary | ICD-10-CM | POA: Diagnosis not present

## 2022-03-09 DIAGNOSIS — M545 Low back pain, unspecified: Secondary | ICD-10-CM | POA: Diagnosis not present

## 2022-03-11 DIAGNOSIS — R7303 Prediabetes: Secondary | ICD-10-CM | POA: Diagnosis not present

## 2022-03-11 DIAGNOSIS — I119 Hypertensive heart disease without heart failure: Secondary | ICD-10-CM | POA: Diagnosis not present

## 2022-03-11 DIAGNOSIS — J329 Chronic sinusitis, unspecified: Secondary | ICD-10-CM | POA: Diagnosis not present

## 2022-03-11 DIAGNOSIS — I2510 Atherosclerotic heart disease of native coronary artery without angina pectoris: Secondary | ICD-10-CM | POA: Diagnosis not present

## 2022-03-11 DIAGNOSIS — I10 Essential (primary) hypertension: Secondary | ICD-10-CM | POA: Diagnosis not present

## 2022-03-11 DIAGNOSIS — M1990 Unspecified osteoarthritis, unspecified site: Secondary | ICD-10-CM | POA: Diagnosis not present

## 2022-03-11 DIAGNOSIS — J302 Other seasonal allergic rhinitis: Secondary | ICD-10-CM | POA: Diagnosis not present

## 2022-03-11 DIAGNOSIS — F3341 Major depressive disorder, recurrent, in partial remission: Secondary | ICD-10-CM | POA: Diagnosis not present

## 2022-03-11 DIAGNOSIS — E782 Mixed hyperlipidemia: Secondary | ICD-10-CM | POA: Diagnosis not present

## 2022-03-11 DIAGNOSIS — R7989 Other specified abnormal findings of blood chemistry: Secondary | ICD-10-CM | POA: Diagnosis not present

## 2022-03-23 DIAGNOSIS — E7800 Pure hypercholesterolemia, unspecified: Secondary | ICD-10-CM | POA: Diagnosis not present

## 2022-03-23 DIAGNOSIS — Z79899 Other long term (current) drug therapy: Secondary | ICD-10-CM | POA: Diagnosis not present

## 2022-03-23 DIAGNOSIS — E559 Vitamin D deficiency, unspecified: Secondary | ICD-10-CM | POA: Diagnosis not present

## 2022-03-23 DIAGNOSIS — R5383 Other fatigue: Secondary | ICD-10-CM | POA: Diagnosis not present

## 2022-03-23 DIAGNOSIS — Z6841 Body Mass Index (BMI) 40.0 and over, adult: Secondary | ICD-10-CM | POA: Diagnosis not present

## 2022-04-01 DIAGNOSIS — F319 Bipolar disorder, unspecified: Secondary | ICD-10-CM | POA: Diagnosis not present

## 2022-04-01 DIAGNOSIS — F410 Panic disorder [episodic paroxysmal anxiety] without agoraphobia: Secondary | ICD-10-CM | POA: Diagnosis not present

## 2022-04-13 DIAGNOSIS — Z79891 Long term (current) use of opiate analgesic: Secondary | ICD-10-CM | POA: Diagnosis not present

## 2022-04-13 DIAGNOSIS — M542 Cervicalgia: Secondary | ICD-10-CM | POA: Diagnosis not present

## 2022-04-13 DIAGNOSIS — G894 Chronic pain syndrome: Secondary | ICD-10-CM | POA: Diagnosis not present

## 2022-04-13 DIAGNOSIS — M25561 Pain in right knee: Secondary | ICD-10-CM | POA: Diagnosis not present

## 2022-04-13 DIAGNOSIS — M5137 Other intervertebral disc degeneration, lumbosacral region: Secondary | ICD-10-CM | POA: Diagnosis not present

## 2022-04-13 DIAGNOSIS — M545 Low back pain, unspecified: Secondary | ICD-10-CM | POA: Diagnosis not present

## 2022-04-13 DIAGNOSIS — M25562 Pain in left knee: Secondary | ICD-10-CM | POA: Diagnosis not present

## 2022-04-15 DIAGNOSIS — R7989 Other specified abnormal findings of blood chemistry: Secondary | ICD-10-CM | POA: Diagnosis not present

## 2022-04-15 DIAGNOSIS — J302 Other seasonal allergic rhinitis: Secondary | ICD-10-CM | POA: Diagnosis not present

## 2022-04-15 DIAGNOSIS — I10 Essential (primary) hypertension: Secondary | ICD-10-CM | POA: Diagnosis not present

## 2022-04-15 DIAGNOSIS — I2510 Atherosclerotic heart disease of native coronary artery without angina pectoris: Secondary | ICD-10-CM | POA: Diagnosis not present

## 2022-04-15 DIAGNOSIS — I119 Hypertensive heart disease without heart failure: Secondary | ICD-10-CM | POA: Diagnosis not present

## 2022-04-15 DIAGNOSIS — R7303 Prediabetes: Secondary | ICD-10-CM | POA: Diagnosis not present

## 2022-04-15 DIAGNOSIS — E782 Mixed hyperlipidemia: Secondary | ICD-10-CM | POA: Diagnosis not present

## 2022-04-15 DIAGNOSIS — F3341 Major depressive disorder, recurrent, in partial remission: Secondary | ICD-10-CM | POA: Diagnosis not present

## 2022-04-15 DIAGNOSIS — M1990 Unspecified osteoarthritis, unspecified site: Secondary | ICD-10-CM | POA: Diagnosis not present

## 2022-04-22 DIAGNOSIS — G5603 Carpal tunnel syndrome, bilateral upper limbs: Secondary | ICD-10-CM | POA: Diagnosis not present

## 2022-04-22 DIAGNOSIS — M65311 Trigger thumb, right thumb: Secondary | ICD-10-CM | POA: Diagnosis not present

## 2022-04-23 DIAGNOSIS — J300 Vasomotor rhinitis: Secondary | ICD-10-CM | POA: Diagnosis not present

## 2022-04-23 DIAGNOSIS — J3089 Other allergic rhinitis: Secondary | ICD-10-CM | POA: Diagnosis not present

## 2022-04-23 DIAGNOSIS — H1045 Other chronic allergic conjunctivitis: Secondary | ICD-10-CM | POA: Diagnosis not present

## 2022-04-23 DIAGNOSIS — R052 Subacute cough: Secondary | ICD-10-CM | POA: Diagnosis not present

## 2022-04-27 DIAGNOSIS — M65871 Other synovitis and tenosynovitis, right ankle and foot: Secondary | ICD-10-CM | POA: Diagnosis not present

## 2022-04-27 DIAGNOSIS — B351 Tinea unguium: Secondary | ICD-10-CM | POA: Diagnosis not present

## 2022-04-27 DIAGNOSIS — M19071 Primary osteoarthritis, right ankle and foot: Secondary | ICD-10-CM | POA: Diagnosis not present

## 2022-04-27 DIAGNOSIS — M19072 Primary osteoarthritis, left ankle and foot: Secondary | ICD-10-CM | POA: Diagnosis not present

## 2022-04-27 DIAGNOSIS — M792 Neuralgia and neuritis, unspecified: Secondary | ICD-10-CM | POA: Diagnosis not present

## 2022-05-04 DIAGNOSIS — F3341 Major depressive disorder, recurrent, in partial remission: Secondary | ICD-10-CM | POA: Diagnosis not present

## 2022-05-04 DIAGNOSIS — M1990 Unspecified osteoarthritis, unspecified site: Secondary | ICD-10-CM | POA: Diagnosis not present

## 2022-05-04 DIAGNOSIS — E782 Mixed hyperlipidemia: Secondary | ICD-10-CM | POA: Diagnosis not present

## 2022-05-04 DIAGNOSIS — I119 Hypertensive heart disease without heart failure: Secondary | ICD-10-CM | POA: Diagnosis not present

## 2022-05-04 DIAGNOSIS — R7303 Prediabetes: Secondary | ICD-10-CM | POA: Diagnosis not present

## 2022-05-04 DIAGNOSIS — I10 Essential (primary) hypertension: Secondary | ICD-10-CM | POA: Diagnosis not present

## 2022-05-04 DIAGNOSIS — I2510 Atherosclerotic heart disease of native coronary artery without angina pectoris: Secondary | ICD-10-CM | POA: Diagnosis not present

## 2022-05-04 DIAGNOSIS — R7989 Other specified abnormal findings of blood chemistry: Secondary | ICD-10-CM | POA: Diagnosis not present

## 2022-05-04 DIAGNOSIS — J302 Other seasonal allergic rhinitis: Secondary | ICD-10-CM | POA: Diagnosis not present

## 2022-05-06 DIAGNOSIS — B351 Tinea unguium: Secondary | ICD-10-CM | POA: Diagnosis not present

## 2022-05-18 DIAGNOSIS — M545 Low back pain, unspecified: Secondary | ICD-10-CM | POA: Diagnosis not present

## 2022-05-18 DIAGNOSIS — M542 Cervicalgia: Secondary | ICD-10-CM | POA: Diagnosis not present

## 2022-05-18 DIAGNOSIS — Z79891 Long term (current) use of opiate analgesic: Secondary | ICD-10-CM | POA: Diagnosis not present

## 2022-05-18 DIAGNOSIS — M5137 Other intervertebral disc degeneration, lumbosacral region: Secondary | ICD-10-CM | POA: Diagnosis not present

## 2022-05-18 DIAGNOSIS — G894 Chronic pain syndrome: Secondary | ICD-10-CM | POA: Diagnosis not present

## 2022-05-18 DIAGNOSIS — M25562 Pain in left knee: Secondary | ICD-10-CM | POA: Diagnosis not present

## 2022-05-18 DIAGNOSIS — M25561 Pain in right knee: Secondary | ICD-10-CM | POA: Diagnosis not present

## 2022-05-25 DIAGNOSIS — M65871 Other synovitis and tenosynovitis, right ankle and foot: Secondary | ICD-10-CM | POA: Diagnosis not present

## 2022-05-25 DIAGNOSIS — M792 Neuralgia and neuritis, unspecified: Secondary | ICD-10-CM | POA: Diagnosis not present

## 2022-05-25 DIAGNOSIS — M19071 Primary osteoarthritis, right ankle and foot: Secondary | ICD-10-CM | POA: Diagnosis not present

## 2022-05-25 DIAGNOSIS — G5761 Lesion of plantar nerve, right lower limb: Secondary | ICD-10-CM | POA: Diagnosis not present

## 2022-05-31 DIAGNOSIS — I119 Hypertensive heart disease without heart failure: Secondary | ICD-10-CM | POA: Diagnosis not present

## 2022-05-31 DIAGNOSIS — R7989 Other specified abnormal findings of blood chemistry: Secondary | ICD-10-CM | POA: Diagnosis not present

## 2022-05-31 DIAGNOSIS — R7303 Prediabetes: Secondary | ICD-10-CM | POA: Diagnosis not present

## 2022-05-31 DIAGNOSIS — F3341 Major depressive disorder, recurrent, in partial remission: Secondary | ICD-10-CM | POA: Diagnosis not present

## 2022-05-31 DIAGNOSIS — I10 Essential (primary) hypertension: Secondary | ICD-10-CM | POA: Diagnosis not present

## 2022-05-31 DIAGNOSIS — M1990 Unspecified osteoarthritis, unspecified site: Secondary | ICD-10-CM | POA: Diagnosis not present

## 2022-05-31 DIAGNOSIS — E782 Mixed hyperlipidemia: Secondary | ICD-10-CM | POA: Diagnosis not present

## 2022-05-31 DIAGNOSIS — J302 Other seasonal allergic rhinitis: Secondary | ICD-10-CM | POA: Diagnosis not present

## 2022-05-31 DIAGNOSIS — I2510 Atherosclerotic heart disease of native coronary artery without angina pectoris: Secondary | ICD-10-CM | POA: Diagnosis not present

## 2022-06-01 DIAGNOSIS — J342 Deviated nasal septum: Secondary | ICD-10-CM | POA: Diagnosis not present

## 2022-06-01 DIAGNOSIS — J343 Hypertrophy of nasal turbinates: Secondary | ICD-10-CM | POA: Diagnosis not present

## 2022-06-01 DIAGNOSIS — H903 Sensorineural hearing loss, bilateral: Secondary | ICD-10-CM | POA: Diagnosis not present

## 2022-06-01 DIAGNOSIS — H9313 Tinnitus, bilateral: Secondary | ICD-10-CM | POA: Diagnosis not present

## 2022-06-01 DIAGNOSIS — H838X3 Other specified diseases of inner ear, bilateral: Secondary | ICD-10-CM | POA: Diagnosis not present

## 2022-06-01 DIAGNOSIS — J310 Chronic rhinitis: Secondary | ICD-10-CM | POA: Diagnosis not present

## 2022-06-02 ENCOUNTER — Other Ambulatory Visit: Payer: Self-pay | Admitting: Otolaryngology

## 2022-06-02 DIAGNOSIS — J329 Chronic sinusitis, unspecified: Secondary | ICD-10-CM

## 2022-06-04 ENCOUNTER — Other Ambulatory Visit: Payer: Self-pay | Admitting: Physician Assistant

## 2022-06-04 DIAGNOSIS — Z1231 Encounter for screening mammogram for malignant neoplasm of breast: Secondary | ICD-10-CM

## 2022-06-08 DIAGNOSIS — G5761 Lesion of plantar nerve, right lower limb: Secondary | ICD-10-CM | POA: Diagnosis not present

## 2022-06-09 DIAGNOSIS — E559 Vitamin D deficiency, unspecified: Secondary | ICD-10-CM | POA: Diagnosis not present

## 2022-06-09 DIAGNOSIS — F3341 Major depressive disorder, recurrent, in partial remission: Secondary | ICD-10-CM | POA: Diagnosis not present

## 2022-06-09 DIAGNOSIS — N951 Menopausal and female climacteric states: Secondary | ICD-10-CM | POA: Diagnosis not present

## 2022-06-09 DIAGNOSIS — J302 Other seasonal allergic rhinitis: Secondary | ICD-10-CM | POA: Diagnosis not present

## 2022-06-09 DIAGNOSIS — I10 Essential (primary) hypertension: Secondary | ICD-10-CM | POA: Diagnosis not present

## 2022-06-09 DIAGNOSIS — R7303 Prediabetes: Secondary | ICD-10-CM | POA: Diagnosis not present

## 2022-06-09 DIAGNOSIS — E782 Mixed hyperlipidemia: Secondary | ICD-10-CM | POA: Diagnosis not present

## 2022-06-09 DIAGNOSIS — R5383 Other fatigue: Secondary | ICD-10-CM | POA: Diagnosis not present

## 2022-06-15 DIAGNOSIS — Z79891 Long term (current) use of opiate analgesic: Secondary | ICD-10-CM | POA: Diagnosis not present

## 2022-06-15 DIAGNOSIS — G894 Chronic pain syndrome: Secondary | ICD-10-CM | POA: Diagnosis not present

## 2022-06-15 DIAGNOSIS — M5137 Other intervertebral disc degeneration, lumbosacral region: Secondary | ICD-10-CM | POA: Diagnosis not present

## 2022-06-15 DIAGNOSIS — M542 Cervicalgia: Secondary | ICD-10-CM | POA: Diagnosis not present

## 2022-06-15 DIAGNOSIS — M545 Low back pain, unspecified: Secondary | ICD-10-CM | POA: Diagnosis not present

## 2022-06-15 DIAGNOSIS — M25561 Pain in right knee: Secondary | ICD-10-CM | POA: Diagnosis not present

## 2022-06-15 DIAGNOSIS — M25562 Pain in left knee: Secondary | ICD-10-CM | POA: Diagnosis not present

## 2022-06-17 DIAGNOSIS — F410 Panic disorder [episodic paroxysmal anxiety] without agoraphobia: Secondary | ICD-10-CM | POA: Diagnosis not present

## 2022-06-17 DIAGNOSIS — F319 Bipolar disorder, unspecified: Secondary | ICD-10-CM | POA: Diagnosis not present

## 2022-06-22 DIAGNOSIS — N898 Other specified noninflammatory disorders of vagina: Secondary | ICD-10-CM | POA: Diagnosis not present

## 2022-06-22 DIAGNOSIS — Z113 Encounter for screening for infections with a predominantly sexual mode of transmission: Secondary | ICD-10-CM | POA: Diagnosis not present

## 2022-06-29 ENCOUNTER — Ambulatory Visit
Admission: RE | Admit: 2022-06-29 | Discharge: 2022-06-29 | Disposition: A | Discharge status: Home / Self Care | Payer: Medicare Other | Source: Ambulatory Visit | Attending: Otolaryngology | Admitting: Otolaryngology

## 2022-06-29 DIAGNOSIS — J329 Chronic sinusitis, unspecified: Secondary | ICD-10-CM

## 2022-07-01 DIAGNOSIS — M65871 Other synovitis and tenosynovitis, right ankle and foot: Secondary | ICD-10-CM | POA: Diagnosis not present

## 2022-07-01 DIAGNOSIS — M21961 Unspecified acquired deformity of right lower leg: Secondary | ICD-10-CM | POA: Diagnosis not present

## 2022-07-01 DIAGNOSIS — M792 Neuralgia and neuritis, unspecified: Secondary | ICD-10-CM | POA: Diagnosis not present

## 2022-07-01 DIAGNOSIS — R262 Difficulty in walking, not elsewhere classified: Secondary | ICD-10-CM | POA: Diagnosis not present

## 2022-07-01 DIAGNOSIS — G5761 Lesion of plantar nerve, right lower limb: Secondary | ICD-10-CM | POA: Diagnosis not present

## 2022-07-13 DIAGNOSIS — M5137 Other intervertebral disc degeneration, lumbosacral region: Secondary | ICD-10-CM | POA: Diagnosis not present

## 2022-07-13 DIAGNOSIS — G894 Chronic pain syndrome: Secondary | ICD-10-CM | POA: Diagnosis not present

## 2022-07-13 DIAGNOSIS — M546 Pain in thoracic spine: Secondary | ICD-10-CM | POA: Diagnosis not present

## 2022-07-13 DIAGNOSIS — M25562 Pain in left knee: Secondary | ICD-10-CM | POA: Diagnosis not present

## 2022-07-13 DIAGNOSIS — M542 Cervicalgia: Secondary | ICD-10-CM | POA: Diagnosis not present

## 2022-07-13 DIAGNOSIS — M545 Low back pain, unspecified: Secondary | ICD-10-CM | POA: Diagnosis not present

## 2022-07-13 DIAGNOSIS — Z79891 Long term (current) use of opiate analgesic: Secondary | ICD-10-CM | POA: Diagnosis not present

## 2022-07-13 DIAGNOSIS — M25561 Pain in right knee: Secondary | ICD-10-CM | POA: Diagnosis not present

## 2022-07-16 DIAGNOSIS — G5602 Carpal tunnel syndrome, left upper limb: Secondary | ICD-10-CM | POA: Diagnosis not present

## 2022-07-21 DIAGNOSIS — J302 Other seasonal allergic rhinitis: Secondary | ICD-10-CM | POA: Diagnosis not present

## 2022-07-21 DIAGNOSIS — E782 Mixed hyperlipidemia: Secondary | ICD-10-CM | POA: Diagnosis not present

## 2022-07-21 DIAGNOSIS — F3341 Major depressive disorder, recurrent, in partial remission: Secondary | ICD-10-CM | POA: Diagnosis not present

## 2022-07-21 DIAGNOSIS — R7303 Prediabetes: Secondary | ICD-10-CM | POA: Diagnosis not present

## 2022-07-21 DIAGNOSIS — I10 Essential (primary) hypertension: Secondary | ICD-10-CM | POA: Diagnosis not present

## 2022-07-22 DIAGNOSIS — G5761 Lesion of plantar nerve, right lower limb: Secondary | ICD-10-CM | POA: Diagnosis not present

## 2022-07-22 DIAGNOSIS — M19071 Primary osteoarthritis, right ankle and foot: Secondary | ICD-10-CM | POA: Diagnosis not present

## 2022-07-22 DIAGNOSIS — M65871 Other synovitis and tenosynovitis, right ankle and foot: Secondary | ICD-10-CM | POA: Diagnosis not present

## 2022-07-23 DIAGNOSIS — J310 Chronic rhinitis: Secondary | ICD-10-CM | POA: Diagnosis not present

## 2022-07-23 DIAGNOSIS — J343 Hypertrophy of nasal turbinates: Secondary | ICD-10-CM | POA: Diagnosis not present

## 2022-07-26 DIAGNOSIS — Z6841 Body Mass Index (BMI) 40.0 and over, adult: Secondary | ICD-10-CM | POA: Diagnosis not present

## 2022-07-26 DIAGNOSIS — G4730 Sleep apnea, unspecified: Secondary | ICD-10-CM | POA: Diagnosis not present

## 2022-07-26 DIAGNOSIS — E785 Hyperlipidemia, unspecified: Secondary | ICD-10-CM | POA: Diagnosis not present

## 2022-07-26 DIAGNOSIS — I10 Essential (primary) hypertension: Secondary | ICD-10-CM | POA: Diagnosis not present

## 2022-07-26 DIAGNOSIS — I2510 Atherosclerotic heart disease of native coronary artery without angina pectoris: Secondary | ICD-10-CM | POA: Diagnosis not present

## 2022-08-03 DIAGNOSIS — Z79891 Long term (current) use of opiate analgesic: Secondary | ICD-10-CM | POA: Diagnosis not present

## 2022-08-03 DIAGNOSIS — M546 Pain in thoracic spine: Secondary | ICD-10-CM | POA: Diagnosis not present

## 2022-08-03 DIAGNOSIS — M542 Cervicalgia: Secondary | ICD-10-CM | POA: Diagnosis not present

## 2022-08-03 DIAGNOSIS — M5137 Other intervertebral disc degeneration, lumbosacral region: Secondary | ICD-10-CM | POA: Diagnosis not present

## 2022-08-03 DIAGNOSIS — M25561 Pain in right knee: Secondary | ICD-10-CM | POA: Diagnosis not present

## 2022-08-03 DIAGNOSIS — G894 Chronic pain syndrome: Secondary | ICD-10-CM | POA: Diagnosis not present

## 2022-08-03 DIAGNOSIS — M25562 Pain in left knee: Secondary | ICD-10-CM | POA: Diagnosis not present

## 2022-08-03 DIAGNOSIS — M545 Low back pain, unspecified: Secondary | ICD-10-CM | POA: Diagnosis not present

## 2022-08-16 ENCOUNTER — Ambulatory Visit
Admission: RE | Admit: 2022-08-16 | Discharge: 2022-08-16 | Disposition: A | Discharge status: Home / Self Care | Payer: Medicare Other | Source: Ambulatory Visit | Attending: Physician Assistant | Admitting: Physician Assistant

## 2022-08-16 DIAGNOSIS — Z1231 Encounter for screening mammogram for malignant neoplasm of breast: Secondary | ICD-10-CM

## 2022-08-24 DIAGNOSIS — J300 Vasomotor rhinitis: Secondary | ICD-10-CM | POA: Diagnosis not present

## 2022-08-24 DIAGNOSIS — H1045 Other chronic allergic conjunctivitis: Secondary | ICD-10-CM | POA: Diagnosis not present

## 2022-08-24 DIAGNOSIS — K219 Gastro-esophageal reflux disease without esophagitis: Secondary | ICD-10-CM | POA: Diagnosis not present

## 2022-08-24 DIAGNOSIS — J3089 Other allergic rhinitis: Secondary | ICD-10-CM | POA: Diagnosis not present

## 2022-08-24 DIAGNOSIS — R052 Subacute cough: Secondary | ICD-10-CM | POA: Diagnosis not present

## 2022-08-31 DIAGNOSIS — G5761 Lesion of plantar nerve, right lower limb: Secondary | ICD-10-CM | POA: Diagnosis not present

## 2022-08-31 DIAGNOSIS — M19071 Primary osteoarthritis, right ankle and foot: Secondary | ICD-10-CM | POA: Diagnosis not present

## 2022-08-31 DIAGNOSIS — M65871 Other synovitis and tenosynovitis, right ankle and foot: Secondary | ICD-10-CM | POA: Diagnosis not present

## 2022-09-01 DIAGNOSIS — Z6841 Body Mass Index (BMI) 40.0 and over, adult: Secondary | ICD-10-CM | POA: Diagnosis not present

## 2022-09-01 DIAGNOSIS — I10 Essential (primary) hypertension: Secondary | ICD-10-CM | POA: Diagnosis not present

## 2022-09-06 DIAGNOSIS — E782 Mixed hyperlipidemia: Secondary | ICD-10-CM | POA: Diagnosis not present

## 2022-09-06 DIAGNOSIS — I10 Essential (primary) hypertension: Secondary | ICD-10-CM | POA: Diagnosis not present

## 2022-09-06 DIAGNOSIS — F3341 Major depressive disorder, recurrent, in partial remission: Secondary | ICD-10-CM | POA: Diagnosis not present

## 2022-09-06 DIAGNOSIS — R7303 Prediabetes: Secondary | ICD-10-CM | POA: Diagnosis not present

## 2022-09-06 DIAGNOSIS — J302 Other seasonal allergic rhinitis: Secondary | ICD-10-CM | POA: Diagnosis not present

## 2022-09-07 DIAGNOSIS — F319 Bipolar disorder, unspecified: Secondary | ICD-10-CM | POA: Diagnosis not present

## 2022-09-07 DIAGNOSIS — F410 Panic disorder [episodic paroxysmal anxiety] without agoraphobia: Secondary | ICD-10-CM | POA: Diagnosis not present

## 2022-09-10 IMAGING — MR MR LUMBAR SPINE W/O CM
4 of 5 series · 18 of 48 positions shown · non-contrast
Comparison: MR lumbar 10/01/2018.

CLINICAL DATA: Chronic low back pain radiating down left leg and
arm for 2 years. No known injury. Patient denies history of cancer,
therapeutic injections, surgeries or change in bowels/bladder.

EXAM:
MRI LUMBAR SPINE WITHOUT CONTRAST
TECHNIQUE: Multiplanar, multisequence MR imaging of the lumbar spine was
performed. No intravenous contrast was administered.

[Series 5: T2 · sagittal · 4.0mm · 0.73mm/px · 6 of 15 slices shown (1 of 2)]
[im 1/15]
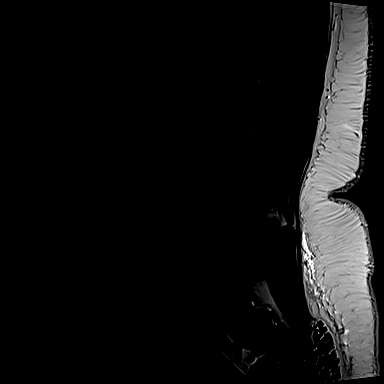
[im 3/15]
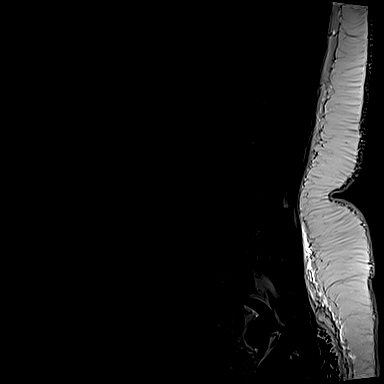
[im 6/15]
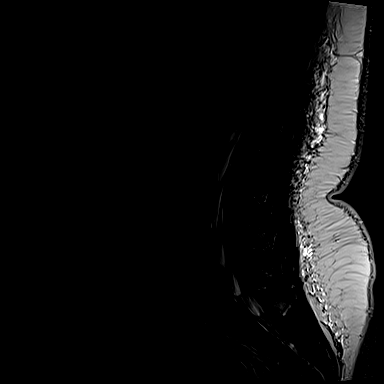
[im 9/15]
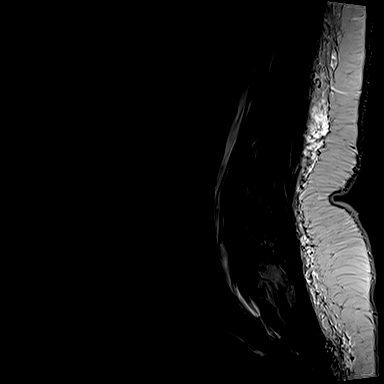
[im 12/15]
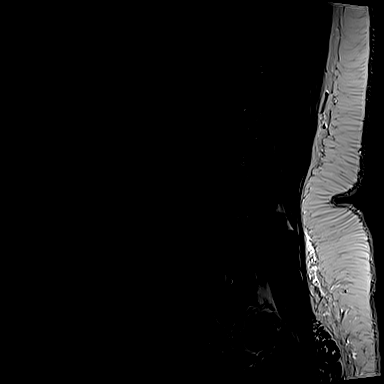
[im 15/15]
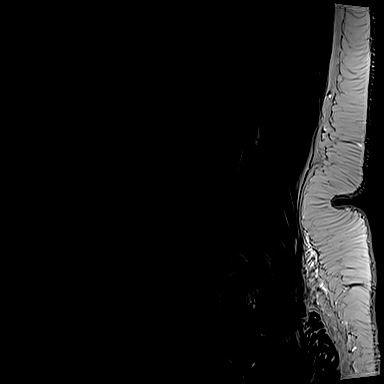

[Series 6: T1 · sagittal · 4.0mm · 0.73mm/px · 3 of 15 slices shown (1 of 2)]
[im 3/15]
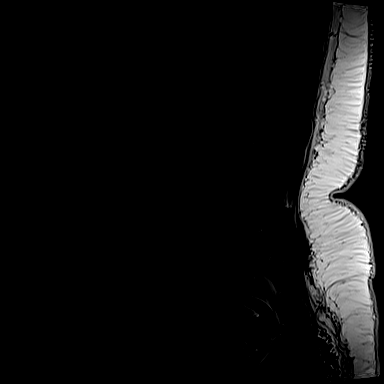
[im 9/15]
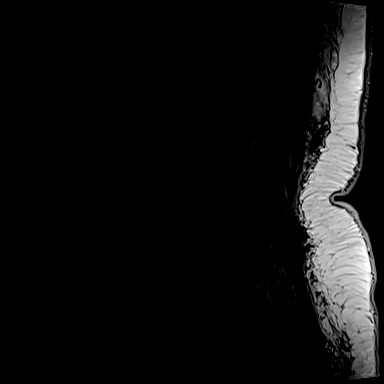
[im 15/15]
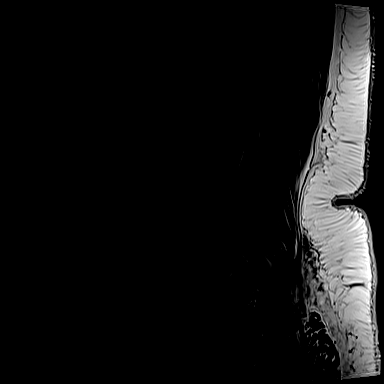

[Series 10: T1 · axial · 4.0mm · 0.28mm/px · z∈[-27,+140]mm · 3 of 40 slices shown (2 of 2)]
[im 6/40]
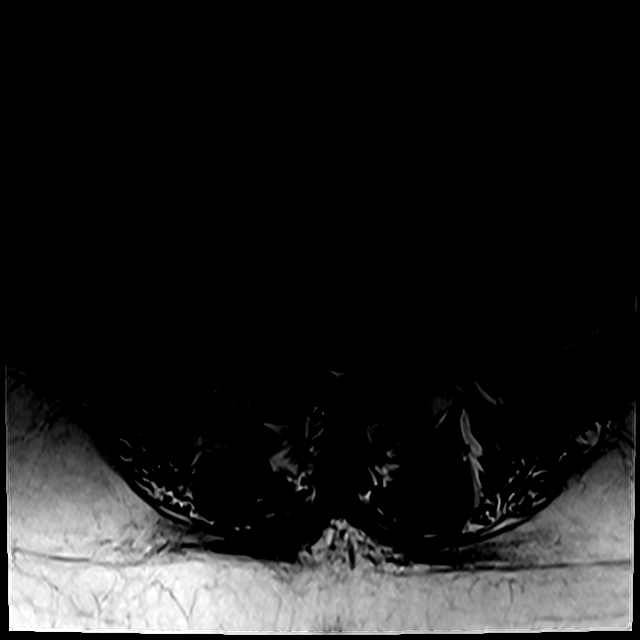
[im 20/40]
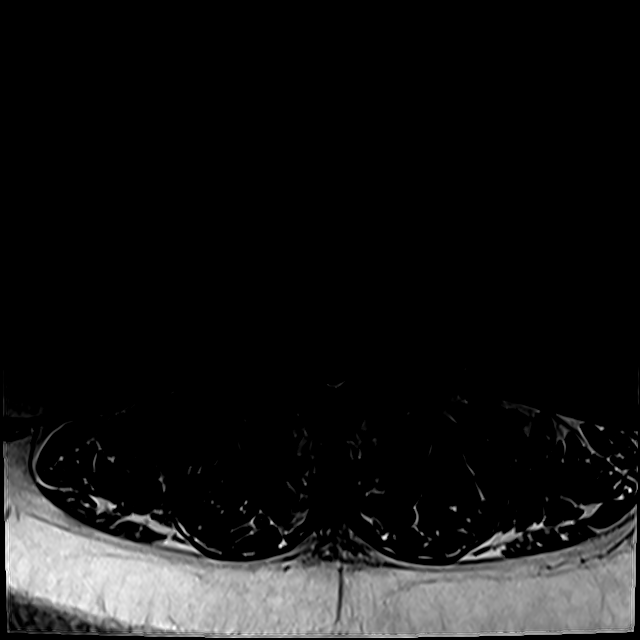
[im 34/40]
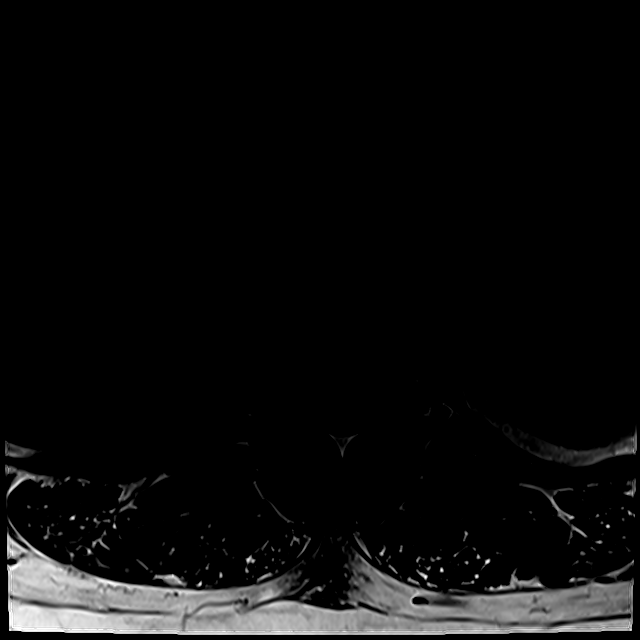

[Series 13: T2 · axial · 4.0mm · 0.28mm/px · z∈[-52,+140]mm · 6 of 40 slices shown (2 of 2)]
[im 1/40]
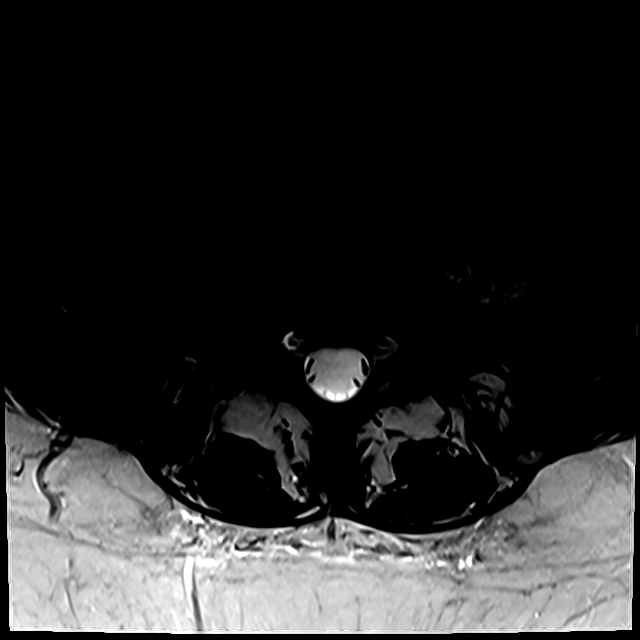
[im 6/40]
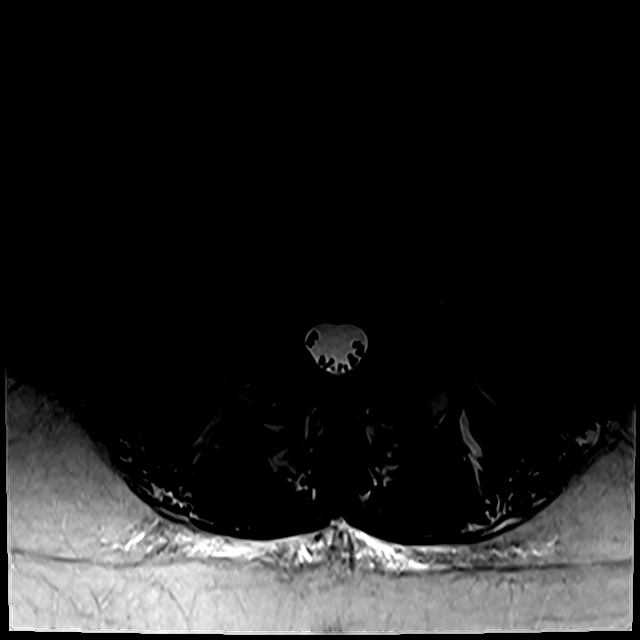
[im 12/40]
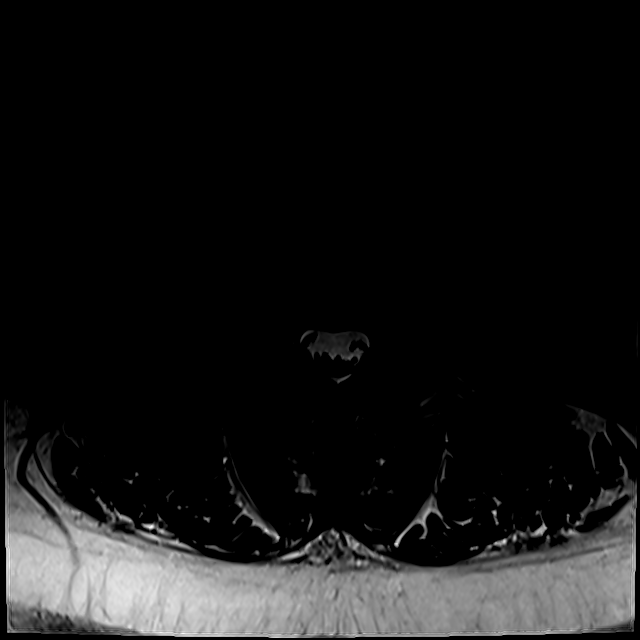
[im 17/40]
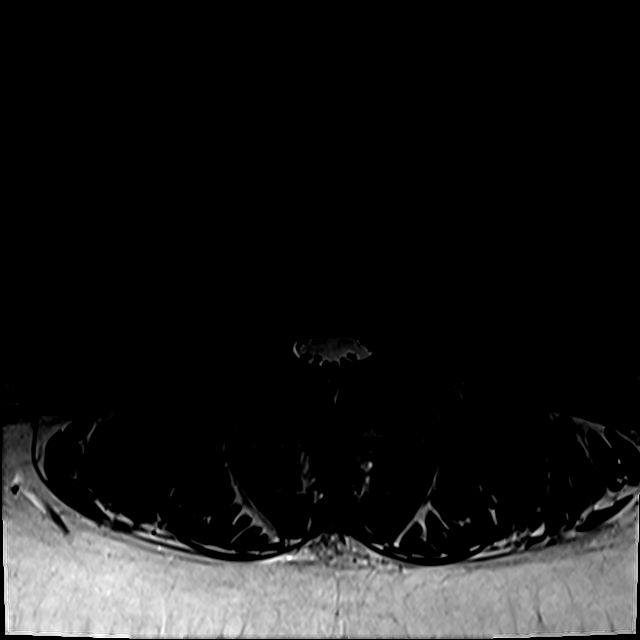
[im 20/40]
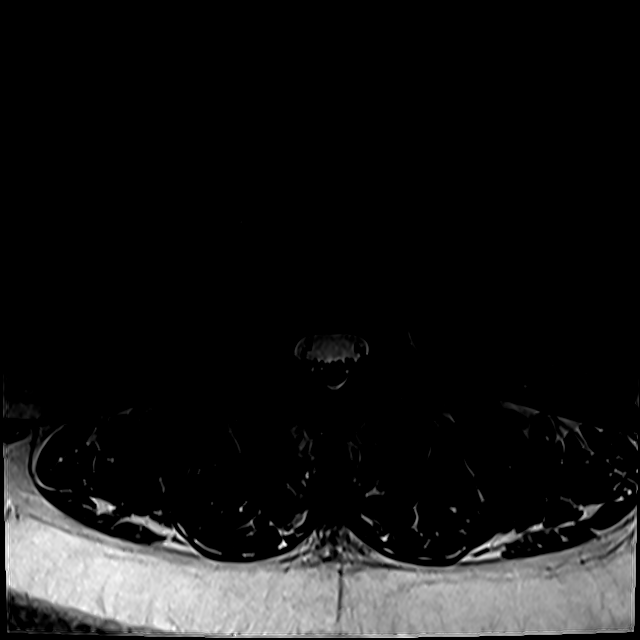
[im 34/40]
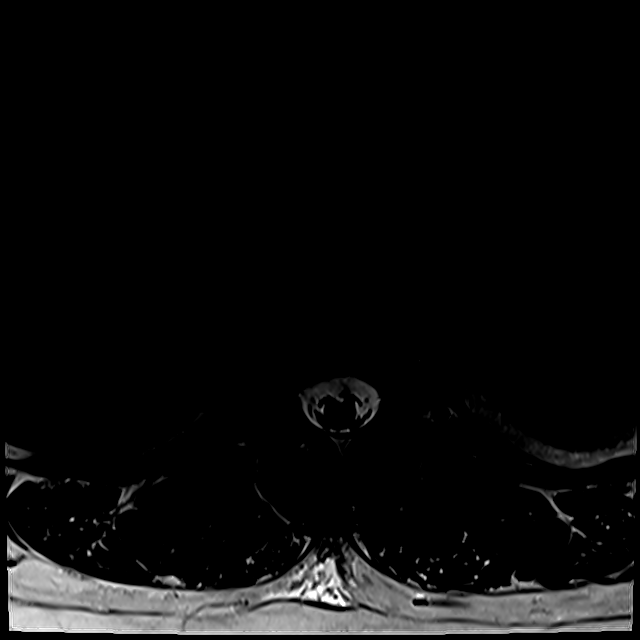

[18 of 48 positions shown; findings below may reference images not displayed]

FINDINGS: Segmentation:  Standard.

Alignment:  1-2 mm anterolisthesis of L4 on L5.

Vertebrae: No acute fracture, evidence of discitis, or aggressive
bone lesion.

Conus medullaris and cauda equina: Conus extends to the L1 level.
Conus and cauda equina appear normal.

Paraspinal and other soft tissues: No acute paraspinal abnormality.

Disc levels:

Disc spaces: Disc desiccation at L4-5. Remainder of the disc spaces
are maintained.

T12-L1: Small right paracentral disc protrusion. No foraminal or
central canal stenosis.

L1-L2: No significant disc bulge. No neural foraminal stenosis. No
central canal stenosis.

L2-L3: No significant disc bulge. No neural foraminal stenosis. No
central canal stenosis.

L3-L4: No significant disc bulge. No neural foraminal stenosis. No
central canal stenosis. Mild bilateral facet arthropathy.

L4-L5: Broad-based disc bulge. Severe bilateral facet arthropathy.
No foraminal or central canal stenosis.

L5-S1: No significant disc bulge. No neural foraminal stenosis. No
central canal stenosis. Mild bilateral facet arthropathy.
IMPRESSION: 1. Lumbar spine spondylosis as described above. No evidence of nerve
root impingement.
2. At L4-5 there is severe bilateral facet arthropathy.
3.  No acute osseous injury of the lumbar spine.

## 2022-09-13 DIAGNOSIS — G5761 Lesion of plantar nerve, right lower limb: Secondary | ICD-10-CM | POA: Diagnosis not present

## 2022-09-13 DIAGNOSIS — M792 Neuralgia and neuritis, unspecified: Secondary | ICD-10-CM | POA: Diagnosis not present

## 2022-09-14 DIAGNOSIS — Z01818 Encounter for other preprocedural examination: Secondary | ICD-10-CM | POA: Diagnosis not present

## 2022-09-14 DIAGNOSIS — E782 Mixed hyperlipidemia: Secondary | ICD-10-CM | POA: Diagnosis not present

## 2022-09-14 DIAGNOSIS — J302 Other seasonal allergic rhinitis: Secondary | ICD-10-CM | POA: Diagnosis not present

## 2022-09-14 DIAGNOSIS — F3341 Major depressive disorder, recurrent, in partial remission: Secondary | ICD-10-CM | POA: Diagnosis not present

## 2022-09-14 DIAGNOSIS — R7303 Prediabetes: Secondary | ICD-10-CM | POA: Diagnosis not present

## 2022-09-14 DIAGNOSIS — I10 Essential (primary) hypertension: Secondary | ICD-10-CM | POA: Diagnosis not present

## 2022-09-17 DIAGNOSIS — G5761 Lesion of plantar nerve, right lower limb: Secondary | ICD-10-CM | POA: Diagnosis not present

## 2022-09-17 DIAGNOSIS — G8918 Other acute postprocedural pain: Secondary | ICD-10-CM | POA: Diagnosis not present

## 2022-10-21 DIAGNOSIS — G5602 Carpal tunnel syndrome, left upper limb: Secondary | ICD-10-CM | POA: Diagnosis not present

## 2022-10-26 DIAGNOSIS — M25561 Pain in right knee: Secondary | ICD-10-CM | POA: Diagnosis not present

## 2022-10-26 DIAGNOSIS — M542 Cervicalgia: Secondary | ICD-10-CM | POA: Diagnosis not present

## 2022-10-26 DIAGNOSIS — G894 Chronic pain syndrome: Secondary | ICD-10-CM | POA: Diagnosis not present

## 2022-10-26 DIAGNOSIS — M25562 Pain in left knee: Secondary | ICD-10-CM | POA: Diagnosis not present

## 2022-10-26 DIAGNOSIS — M5137 Other intervertebral disc degeneration, lumbosacral region: Secondary | ICD-10-CM | POA: Diagnosis not present

## 2022-10-26 DIAGNOSIS — M545 Low back pain, unspecified: Secondary | ICD-10-CM | POA: Diagnosis not present

## 2022-10-26 DIAGNOSIS — Z79891 Long term (current) use of opiate analgesic: Secondary | ICD-10-CM | POA: Diagnosis not present

## 2022-10-28 DIAGNOSIS — I10 Essential (primary) hypertension: Secondary | ICD-10-CM | POA: Diagnosis not present

## 2022-10-28 DIAGNOSIS — Z0001 Encounter for general adult medical examination with abnormal findings: Secondary | ICD-10-CM | POA: Diagnosis not present

## 2022-10-28 DIAGNOSIS — R7303 Prediabetes: Secondary | ICD-10-CM | POA: Diagnosis not present

## 2022-10-28 DIAGNOSIS — F3341 Major depressive disorder, recurrent, in partial remission: Secondary | ICD-10-CM | POA: Diagnosis not present

## 2022-10-28 DIAGNOSIS — J302 Other seasonal allergic rhinitis: Secondary | ICD-10-CM | POA: Diagnosis not present

## 2022-10-28 DIAGNOSIS — E782 Mixed hyperlipidemia: Secondary | ICD-10-CM | POA: Diagnosis not present

## 2022-11-05 DIAGNOSIS — Z6841 Body Mass Index (BMI) 40.0 and over, adult: Secondary | ICD-10-CM | POA: Diagnosis not present

## 2022-11-05 DIAGNOSIS — I2510 Atherosclerotic heart disease of native coronary artery without angina pectoris: Secondary | ICD-10-CM | POA: Diagnosis not present

## 2022-11-05 DIAGNOSIS — I10 Essential (primary) hypertension: Secondary | ICD-10-CM | POA: Diagnosis not present

## 2022-11-19 DIAGNOSIS — F3341 Major depressive disorder, recurrent, in partial remission: Secondary | ICD-10-CM | POA: Diagnosis not present

## 2022-11-19 DIAGNOSIS — E782 Mixed hyperlipidemia: Secondary | ICD-10-CM | POA: Diagnosis not present

## 2022-11-19 DIAGNOSIS — G5602 Carpal tunnel syndrome, left upper limb: Secondary | ICD-10-CM | POA: Diagnosis not present

## 2022-11-19 DIAGNOSIS — J302 Other seasonal allergic rhinitis: Secondary | ICD-10-CM | POA: Diagnosis not present

## 2022-11-19 DIAGNOSIS — I10 Essential (primary) hypertension: Secondary | ICD-10-CM | POA: Diagnosis not present

## 2022-11-19 DIAGNOSIS — R7303 Prediabetes: Secondary | ICD-10-CM | POA: Diagnosis not present

## 2022-11-23 DIAGNOSIS — M25561 Pain in right knee: Secondary | ICD-10-CM | POA: Diagnosis not present

## 2022-11-23 DIAGNOSIS — M5137 Other intervertebral disc degeneration, lumbosacral region: Secondary | ICD-10-CM | POA: Diagnosis not present

## 2022-11-23 DIAGNOSIS — G894 Chronic pain syndrome: Secondary | ICD-10-CM | POA: Diagnosis not present

## 2022-11-23 DIAGNOSIS — Z79891 Long term (current) use of opiate analgesic: Secondary | ICD-10-CM | POA: Diagnosis not present

## 2022-11-23 DIAGNOSIS — M542 Cervicalgia: Secondary | ICD-10-CM | POA: Diagnosis not present

## 2022-11-23 DIAGNOSIS — M545 Low back pain, unspecified: Secondary | ICD-10-CM | POA: Diagnosis not present

## 2022-11-23 DIAGNOSIS — M25562 Pain in left knee: Secondary | ICD-10-CM | POA: Diagnosis not present

## 2022-11-26 ENCOUNTER — Other Ambulatory Visit: Payer: Self-pay | Admitting: Physician Assistant

## 2022-11-26 DIAGNOSIS — Z1231 Encounter for screening mammogram for malignant neoplasm of breast: Secondary | ICD-10-CM

## 2022-11-30 DIAGNOSIS — F319 Bipolar disorder, unspecified: Secondary | ICD-10-CM | POA: Diagnosis not present

## 2022-11-30 DIAGNOSIS — F410 Panic disorder [episodic paroxysmal anxiety] without agoraphobia: Secondary | ICD-10-CM | POA: Diagnosis not present

## 2022-12-13 DIAGNOSIS — N911 Secondary amenorrhea: Secondary | ICD-10-CM | POA: Diagnosis not present

## 2022-12-13 DIAGNOSIS — R0602 Shortness of breath: Secondary | ICD-10-CM | POA: Diagnosis not present

## 2022-12-13 DIAGNOSIS — Z124 Encounter for screening for malignant neoplasm of cervix: Secondary | ICD-10-CM | POA: Diagnosis not present

## 2022-12-13 DIAGNOSIS — E559 Vitamin D deficiency, unspecified: Secondary | ICD-10-CM | POA: Diagnosis not present

## 2022-12-13 DIAGNOSIS — Z79899 Other long term (current) drug therapy: Secondary | ICD-10-CM | POA: Diagnosis not present

## 2022-12-13 DIAGNOSIS — R5383 Other fatigue: Secondary | ICD-10-CM | POA: Diagnosis not present

## 2022-12-13 DIAGNOSIS — E7800 Pure hypercholesterolemia, unspecified: Secondary | ICD-10-CM | POA: Diagnosis not present

## 2022-12-13 DIAGNOSIS — Z1159 Encounter for screening for other viral diseases: Secondary | ICD-10-CM | POA: Diagnosis not present

## 2022-12-13 DIAGNOSIS — Z6841 Body Mass Index (BMI) 40.0 and over, adult: Secondary | ICD-10-CM | POA: Diagnosis not present

## 2022-12-16 DIAGNOSIS — E782 Mixed hyperlipidemia: Secondary | ICD-10-CM | POA: Diagnosis not present

## 2022-12-16 DIAGNOSIS — J4520 Mild intermittent asthma, uncomplicated: Secondary | ICD-10-CM | POA: Diagnosis not present

## 2022-12-21 DIAGNOSIS — M5137 Other intervertebral disc degeneration, lumbosacral region: Secondary | ICD-10-CM | POA: Diagnosis not present

## 2022-12-21 DIAGNOSIS — M545 Low back pain, unspecified: Secondary | ICD-10-CM | POA: Diagnosis not present

## 2022-12-21 DIAGNOSIS — G894 Chronic pain syndrome: Secondary | ICD-10-CM | POA: Diagnosis not present

## 2022-12-21 DIAGNOSIS — M25561 Pain in right knee: Secondary | ICD-10-CM | POA: Diagnosis not present

## 2022-12-21 DIAGNOSIS — Z79891 Long term (current) use of opiate analgesic: Secondary | ICD-10-CM | POA: Diagnosis not present

## 2022-12-21 DIAGNOSIS — M542 Cervicalgia: Secondary | ICD-10-CM | POA: Diagnosis not present

## 2022-12-21 DIAGNOSIS — M25562 Pain in left knee: Secondary | ICD-10-CM | POA: Diagnosis not present

## 2022-12-22 DIAGNOSIS — M19071 Primary osteoarthritis, right ankle and foot: Secondary | ICD-10-CM | POA: Diagnosis not present

## 2022-12-22 DIAGNOSIS — L905 Scar conditions and fibrosis of skin: Secondary | ICD-10-CM | POA: Diagnosis not present

## 2022-12-22 DIAGNOSIS — G5761 Lesion of plantar nerve, right lower limb: Secondary | ICD-10-CM | POA: Diagnosis not present

## 2023-01-03 DIAGNOSIS — G4730 Sleep apnea, unspecified: Secondary | ICD-10-CM | POA: Diagnosis not present

## 2023-01-03 DIAGNOSIS — E785 Hyperlipidemia, unspecified: Secondary | ICD-10-CM | POA: Diagnosis not present

## 2023-01-03 DIAGNOSIS — I10 Essential (primary) hypertension: Secondary | ICD-10-CM | POA: Diagnosis not present

## 2023-01-03 DIAGNOSIS — I2510 Atherosclerotic heart disease of native coronary artery without angina pectoris: Secondary | ICD-10-CM | POA: Diagnosis not present

## 2023-01-03 DIAGNOSIS — Z6841 Body Mass Index (BMI) 40.0 and over, adult: Secondary | ICD-10-CM | POA: Diagnosis not present

## 2023-01-11 DIAGNOSIS — E559 Vitamin D deficiency, unspecified: Secondary | ICD-10-CM | POA: Diagnosis not present

## 2023-01-11 DIAGNOSIS — Z79899 Other long term (current) drug therapy: Secondary | ICD-10-CM | POA: Diagnosis not present

## 2023-01-11 DIAGNOSIS — E7800 Pure hypercholesterolemia, unspecified: Secondary | ICD-10-CM | POA: Diagnosis not present

## 2023-01-11 DIAGNOSIS — Z6841 Body Mass Index (BMI) 40.0 and over, adult: Secondary | ICD-10-CM | POA: Diagnosis not present

## 2023-01-11 DIAGNOSIS — E669 Obesity, unspecified: Secondary | ICD-10-CM | POA: Diagnosis not present

## 2023-01-18 DIAGNOSIS — M25561 Pain in right knee: Secondary | ICD-10-CM | POA: Diagnosis not present

## 2023-01-18 DIAGNOSIS — M545 Low back pain, unspecified: Secondary | ICD-10-CM | POA: Diagnosis not present

## 2023-01-18 DIAGNOSIS — M5137 Other intervertebral disc degeneration, lumbosacral region: Secondary | ICD-10-CM | POA: Diagnosis not present

## 2023-01-18 DIAGNOSIS — M542 Cervicalgia: Secondary | ICD-10-CM | POA: Diagnosis not present

## 2023-01-18 DIAGNOSIS — M25562 Pain in left knee: Secondary | ICD-10-CM | POA: Diagnosis not present

## 2023-01-18 DIAGNOSIS — G894 Chronic pain syndrome: Secondary | ICD-10-CM | POA: Diagnosis not present

## 2023-01-18 DIAGNOSIS — Z79891 Long term (current) use of opiate analgesic: Secondary | ICD-10-CM | POA: Diagnosis not present

## 2023-02-14 DIAGNOSIS — K219 Gastro-esophageal reflux disease without esophagitis: Secondary | ICD-10-CM | POA: Diagnosis not present

## 2023-02-14 DIAGNOSIS — J300 Vasomotor rhinitis: Secondary | ICD-10-CM | POA: Diagnosis not present

## 2023-02-14 DIAGNOSIS — R052 Subacute cough: Secondary | ICD-10-CM | POA: Diagnosis not present

## 2023-02-14 DIAGNOSIS — H1045 Other chronic allergic conjunctivitis: Secondary | ICD-10-CM | POA: Diagnosis not present

## 2023-02-18 DIAGNOSIS — R7303 Prediabetes: Secondary | ICD-10-CM | POA: Diagnosis not present

## 2023-02-18 DIAGNOSIS — F3341 Major depressive disorder, recurrent, in partial remission: Secondary | ICD-10-CM | POA: Diagnosis not present

## 2023-02-18 DIAGNOSIS — J302 Other seasonal allergic rhinitis: Secondary | ICD-10-CM | POA: Diagnosis not present

## 2023-02-18 DIAGNOSIS — I10 Essential (primary) hypertension: Secondary | ICD-10-CM | POA: Diagnosis not present

## 2023-02-18 DIAGNOSIS — E782 Mixed hyperlipidemia: Secondary | ICD-10-CM | POA: Diagnosis not present

## 2023-03-01 DIAGNOSIS — R7303 Prediabetes: Secondary | ICD-10-CM | POA: Diagnosis not present

## 2023-03-01 DIAGNOSIS — J302 Other seasonal allergic rhinitis: Secondary | ICD-10-CM | POA: Diagnosis not present

## 2023-03-01 DIAGNOSIS — R609 Edema, unspecified: Secondary | ICD-10-CM | POA: Diagnosis not present

## 2023-03-01 DIAGNOSIS — F3341 Major depressive disorder, recurrent, in partial remission: Secondary | ICD-10-CM | POA: Diagnosis not present

## 2023-03-01 DIAGNOSIS — E782 Mixed hyperlipidemia: Secondary | ICD-10-CM | POA: Diagnosis not present

## 2023-03-01 DIAGNOSIS — I10 Essential (primary) hypertension: Secondary | ICD-10-CM | POA: Diagnosis not present

## 2023-03-08 DIAGNOSIS — I10 Essential (primary) hypertension: Secondary | ICD-10-CM | POA: Diagnosis not present

## 2023-03-08 DIAGNOSIS — J302 Other seasonal allergic rhinitis: Secondary | ICD-10-CM | POA: Diagnosis not present

## 2023-03-08 DIAGNOSIS — R7303 Prediabetes: Secondary | ICD-10-CM | POA: Diagnosis not present

## 2023-03-08 DIAGNOSIS — E782 Mixed hyperlipidemia: Secondary | ICD-10-CM | POA: Diagnosis not present

## 2023-03-08 DIAGNOSIS — F3341 Major depressive disorder, recurrent, in partial remission: Secondary | ICD-10-CM | POA: Diagnosis not present

## 2023-03-08 DIAGNOSIS — R609 Edema, unspecified: Secondary | ICD-10-CM | POA: Diagnosis not present

## 2023-03-24 ENCOUNTER — Emergency Department (HOSPITAL_COMMUNITY): Payer: Medicare Other

## 2023-03-24 ENCOUNTER — Encounter (HOSPITAL_COMMUNITY): Payer: Self-pay

## 2023-03-24 ENCOUNTER — Emergency Department (HOSPITAL_COMMUNITY)
Admission: EM | Admit: 2023-03-24 | Discharge: 2023-03-24 | Disposition: A | Discharge status: Home / Self Care | Payer: Medicare Other | Attending: Emergency Medicine | Admitting: Emergency Medicine

## 2023-03-24 DIAGNOSIS — R112 Nausea with vomiting, unspecified: Secondary | ICD-10-CM | POA: Diagnosis not present

## 2023-03-24 DIAGNOSIS — R0600 Dyspnea, unspecified: Secondary | ICD-10-CM | POA: Diagnosis not present

## 2023-03-24 DIAGNOSIS — J45909 Unspecified asthma, uncomplicated: Secondary | ICD-10-CM | POA: Diagnosis not present

## 2023-03-24 DIAGNOSIS — Z79899 Other long term (current) drug therapy: Secondary | ICD-10-CM | POA: Diagnosis not present

## 2023-03-24 DIAGNOSIS — I10 Essential (primary) hypertension: Secondary | ICD-10-CM | POA: Insufficient documentation

## 2023-03-24 DIAGNOSIS — R1110 Vomiting, unspecified: Secondary | ICD-10-CM | POA: Diagnosis not present

## 2023-03-24 DIAGNOSIS — R0789 Other chest pain: Secondary | ICD-10-CM | POA: Insufficient documentation

## 2023-03-24 DIAGNOSIS — K760 Fatty (change of) liver, not elsewhere classified: Secondary | ICD-10-CM | POA: Diagnosis not present

## 2023-03-24 DIAGNOSIS — R079 Chest pain, unspecified: Secondary | ICD-10-CM

## 2023-03-24 DIAGNOSIS — R0602 Shortness of breath: Secondary | ICD-10-CM | POA: Insufficient documentation

## 2023-03-24 DIAGNOSIS — R109 Unspecified abdominal pain: Secondary | ICD-10-CM | POA: Diagnosis not present

## 2023-03-24 LAB — CBC
HCT: 42.6 % (ref 36.0–46.0)
Hemoglobin: 13.2 g/dL (ref 12.0–15.0)
MCH: 27.2 pg (ref 26.0–34.0)
MCHC: 31 g/dL (ref 30.0–36.0)
MCV: 87.8 fL (ref 80.0–100.0)
Platelets: 260 10*3/uL (ref 150–400)
RBC: 4.85 MIL/uL (ref 3.87–5.11)
RDW: 12.3 % (ref 11.5–15.5)
WBC: 6.2 10*3/uL (ref 4.0–10.5)
nRBC: 0 % (ref 0.0–0.2)

## 2023-03-24 LAB — COMPREHENSIVE METABOLIC PANEL
ALT: 20 U/L (ref 0–44)
AST: 22 U/L (ref 15–41)
Albumin: 3.1 g/dL — ABNORMAL LOW (ref 3.5–5.0)
Alkaline Phosphatase: 83 U/L (ref 38–126)
Anion gap: 5 (ref 5–15)
BUN: 7 mg/dL (ref 6–20)
CO2: 28 mmol/L (ref 22–32)
Calcium: 9.1 mg/dL (ref 8.9–10.3)
Chloride: 109 mmol/L (ref 98–111)
Creatinine, Ser: 0.85 mg/dL (ref 0.44–1.00)
GFR, Estimated: >60 mL/min (ref >60)
Glucose, Bld: 96 mg/dL (ref 70–99)
Potassium: 3.8 mmol/L (ref 3.5–5.1)
Sodium: 142 mmol/L (ref 135–145)
Total Bilirubin: 0.5 mg/dL (ref 0.3–1.2)
Total Protein: 5.8 g/dL — ABNORMAL LOW (ref 6.5–8.1)

## 2023-03-24 LAB — TROPONIN I (HIGH SENSITIVITY)
Troponin I (High Sensitivity): 3 ng/L (ref <18)
Troponin I (High Sensitivity): 3 ng/L (ref <18)

## 2023-03-24 LAB — D-DIMER, QUANTITATIVE: D-Dimer, Quant: 0.37 ug/mL-FEU (ref 0.00–0.50)

## 2023-03-24 LAB — CBG MONITORING, ED: Glucose-Capillary: 65 mg/dL — ABNORMAL LOW (ref 70–99)

## 2023-03-24 LAB — LIPASE, BLOOD: Lipase: 38 U/L (ref 11–51)

## 2023-03-24 MED ORDER — ONDANSETRON 8 MG PO TBDP: 8.0000 mg | ORAL_TABLET | Freq: Three times a day (TID) | ORAL | 0 refills | Status: DC | PRN

## 2023-03-24 MED ORDER — ONDANSETRON HCL 4 MG/2ML IJ SOLN
4.0000 mg | Freq: Once | INTRAMUSCULAR | Status: AC
  Administered 2023-03-24: 4 mg via INTRAVENOUS
  Filled 2023-03-24: qty 2

## 2023-03-24 MED ORDER — ASPIRIN 81 MG PO CHEW
324.0000 mg | CHEWABLE_TABLET | Freq: Once | ORAL | Status: AC
  Administered 2023-03-24: 324 mg via ORAL
  Filled 2023-03-24: qty 4

## 2023-03-24 MED ORDER — PANTOPRAZOLE SODIUM 20 MG PO TBEC: 20.0000 mg | DELAYED_RELEASE_TABLET | Freq: Every day | ORAL | 0 refills | Status: DC

## 2023-03-24 MED ORDER — PANTOPRAZOLE SODIUM 40 MG IV SOLR
40.0000 mg | Freq: Once | INTRAVENOUS | Status: AC
  Administered 2023-03-24: 40 mg via INTRAVENOUS
  Filled 2023-03-24: qty 10

## 2023-03-24 NOTE — Discharge Instructions (Signed)
Take the medications as prescribed to help with nausea and possible acid reflux.  Follow-up with your doctor to be rechecked.

## 2023-03-24 NOTE — ED Provider Notes (Signed)
[place] EMERGENCY DEPARTMENT AT [facility] Provider Note   CSN: [id number] Arrival date & time: 03/24/23  1248     History  Chief Complaint  Patient presents with   Shortness of Breath    Jo Allen is a 57 y.o. female.   Shortness of Breath    Patient has history of chronic back pain, trigeminal neuralgia, hypertension asthma who presents to the ED for evaluation of chest discomfort and shortness of breath.  Patient states over the last week or so she has been using her inhaler more frequently.  Patient states she has been walking around a lot with a new job and was using it to make sure she did not get short of breath.  Patient was at [facility] eating something today when she suddenly started feeling nauseous.  She went and vomited.  Patient ended up regurgitating all her food.  Following that she started having pressure in her chest.  Patient states that is now improving.  She is having some pain in her upper abdomen  Home Medications Prior to Admission medications   Medication Sig Start Date End Date Taking? Authorizing Provider  ondansetron (ZOFRAN-ODT) 8 MG disintegrating tablet Take 1 tablet (8 mg total) by mouth every 8 (eight) hours as needed for nausea or vomiting. 03/24/23  Yes Linwood Dibbles, MD  pantoprazole (PROTONIX) 20 MG tablet Take 1 tablet (20 mg total) by mouth daily. 03/24/23  Yes Linwood Dibbles, MD  acetaminophen (TYLENOL 8 HOUR) 650 MG CR tablet Take 1 tablet (650 mg total) by mouth every 8 (eight) hours as needed. 12/03/19   Derwood Kaplan, MD  albuterol (VENTOLIN HFA) 108 (90 Base) MCG/ACT inhaler Inhale 1-2 puffs into the lungs every 6 (six) hours as needed for wheezing or shortness of breath. 01/03/22   Tomi Bamberger, PA-C  azithromycin (ZITHROMAX) 250 MG tablet Take 1 tablet (250 mg total) by mouth daily. Take first 2 tablets together, then 1 every day until finished. 01/03/22   Tomi Bamberger, PA-C  Biotin 1000 MCG CHEW Chew 5,000 mcg by mouth  daily.    [provider]  buPROPion (WELLBUTRIN XL) 150 MG 24 hr tablet Take 150 mg by mouth 3 (three) times daily. 11/22/19   [provider]  celecoxib (CELEBREX) 100 MG capsule Take 1-2 capsules (100-200 mg total) by mouth 2 (two) times daily. 02/01/21   Wieters, Hallie C, PA-C  Cholecalciferol (VITAMIN D3) 25 MCG (1000 UT) CAPS Take 2,000 Units by mouth daily.    [provider]  CRANBERRY PO Take 1 tablet by mouth daily.    [provider]  doxepin (SINEQUAN) 25 MG capsule Take 25-50 mg by mouth at bedtime as needed (sleep aid).  11/22/19   [provider]  DULoxetine (CYMBALTA) 30 MG capsule Take 30 mg by mouth 2 (two) times daily. 10/20/19   [provider]  ferrous sulfate 325 (65 FE) MG EC tablet Take 325 mg by mouth daily with breakfast.    [provider]  Ginkgo 60 MG TABS Take 60 mg by mouth daily.    [provider]  hydrochlorothiazide (HYDRODIURIL) 25 MG tablet Take 25 mg by mouth daily.    [provider]  ibuprofen (ADVIL) 600 MG tablet Take 1 tablet (600 mg total) by mouth every 6 (six) hours as needed. 12/03/19   Derwood Kaplan, MD  Boris Lown Oil (OMEGA-3) 500 MG CAPS Take 500 mg by mouth daily.    [provider]  Lactobacillus (PROBIOTIC  ACIDOPHILUS PO) Take 1 tablet by mouth daily.    [provider]  lamoTRIgine (LAMICTAL) 150 MG tablet Take 150 mg by mouth 2 (two) times daily. 11/22/19   [provider]  methocarbamol (ROBAXIN) 500 MG tablet Take 1 tablet (500 mg total) by mouth at bedtime. 10/24/19   Lorelee New, PA-C  montelukast (SINGULAIR) 10 MG tablet Take 10 mg by mouth at bedtime.    [provider]  Multiple Vitamins-Minerals (ONE-A-DAY WOMENS PO) Take 1 tablet by mouth daily.    [provider]  phentermine 37.5 MG capsule Take 37.5 mg by mouth every morning.    [provider]  pregabalin (LYRICA) 50 MG capsule Take 1 capsule (50 mg total)  by mouth 3 (three) times daily. 07/09/21   Anson Fret, MD  tiZANidine (ZANAFLEX) 2 MG tablet Take 1-2 tablets (2-4 mg total) by mouth every 6 (six) hours as needed for muscle spasms. 02/01/21   Wieters, Hallie C, PA-C  verapamil (VERELAN PM) 360 MG 24 hr capsule Take 360 mg by mouth every morning.    [provider]  vitamin B-12 (CYANOCOBALAMIN) 1000 MCG tablet Take 1,000 mcg by mouth daily.    [provider]  vitamin E 100 UNIT capsule Take 300 Units by mouth daily.     [provider]      Allergies    Morphine and codeine, Penicillins, and Tramadol    Review of Systems   Review of Systems  Respiratory:  Positive for shortness of breath.     Physical Exam Updated Vital Signs BP 120/62 (BP Location: Right Arm)   Pulse 83   Temp 97.8 F (36.6 C) (Oral)   Resp 18   Ht 1.626 m (5\' 4" )   Wt 108.9 kg   SpO2 94%   BMI 41.20 kg/m  Physical Exam Vitals and nursing note reviewed.  Constitutional:      General: She is not in acute distress.    Appearance: She is well-developed.  HENT:     Head: Normocephalic and atraumatic.     Right Ear: External ear normal.     Left Ear: External ear normal.  Eyes:     General: No scleral icterus.       Right eye: No discharge.        Left eye: No discharge.     Conjunctiva/sclera: Conjunctivae normal.  Neck:     Trachea: No tracheal deviation.  Cardiovascular:     Rate and Rhythm: Normal rate and regular rhythm.  Pulmonary:     Effort: Pulmonary effort is normal. No respiratory distress.     Breath sounds: Normal breath sounds. No stridor. No wheezing or rales.  Abdominal:     General: Bowel sounds are normal. There is no distension.     Palpations: Abdomen is soft.     Tenderness: There is abdominal tenderness in the right upper quadrant and epigastric area. There is no guarding or rebound.     Comments: Mild tenderness palpation epigastric region  Musculoskeletal:        General: No tenderness or  deformity.     Cervical back: Neck supple.  Skin:    General: Skin is warm and dry.     Findings: No rash.  Neurological:     General: No focal deficit present.     Mental Status: She is alert.     Cranial Nerves: No cranial nerve deficit, dysarthria or facial asymmetry.     Sensory: No  sensory deficit.     Motor: No abnormal muscle tone or seizure activity.     Coordination: Coordination normal.  Psychiatric:        Mood and Affect: Mood normal.     ED Results / Procedures / Treatments   Labs (all labs ordered are listed, but only abnormal results are displayed) Labs Reviewed  COMPREHENSIVE METABOLIC PANEL - Abnormal; Notable for the following components:      Result Value   Total Protein 5.8 (*)    Albumin 3.1 (*)    All other components within normal limits  CBG MONITORING, ED - Abnormal; Notable for the following components:   Glucose-Capillary 65 (*)    All other components within normal limits  CBC  D-DIMER, QUANTITATIVE  LIPASE, BLOOD  TROPONIN I (HIGH SENSITIVITY)  TROPONIN I (HIGH SENSITIVITY)    EKG EKG Interpretation  Date/Time:  Thursday March 24 2023 12:59:00 EDT Ventricular Rate:  78 PR Interval:  154 QRS Duration: 76 QT Interval:  367 QTC Calculation: 418 R Axis:   47 Text Interpretation: Sinus arrhythmia No significant change since last tracing Confirmed by Linwood Dibbles [phone]) on 03/24/2023 1:07:10 PM  Radiology US Abdomen Limited RUQ (LIVER/GB)  Result Date: 03/24/2023 CLINICAL DATA:  Abdominal pain.  Recent episode of vomiting EXAM: ULTRASOUND ABDOMEN LIMITED RIGHT UPPER QUADRANT COMPARISON:  04/26/2014 ultrasound FINDINGS: Gallbladder: Gallbladder is mildly distended. No shadowing stones, wall thickening or adjacent fluid. Common bile duct: Diameter: 2 mm Liver: Diffusely echogenic hepatic parenchyma consistent with fatty liver infiltration. With this level of echogenicity evaluation for underlying mass lesion is limited and if needed follow-up  contrast CT or MRI as clinically directed. Portal vein is patent on color Doppler imaging with normal direction of blood flow towards the liver. Other: None. IMPRESSION: No gallstones or ductal dilatation.  Fatty liver infiltration Electronically Signed   By: Karen Kays M.D.   On: 03/24/2023 14:35   DG Chest 2 View  Result Date: 03/24/2023 CLINICAL DATA:  Dyspnea EXAM: CHEST - 2 VIEW COMPARISON:  X-ray 05/24/2021 FINDINGS: Overlapping cardiac leads. Film is slightly rotated to the left. No consolidation, pneumothorax or effusion. No edema. Normal cardiopericardial silhouette. Overlapping cardiac leads. Degenerative changes of the spine. Diffuse bridging osteophytes. IMPRESSION: No acute cardiopulmonary disease. Electronically Signed   By: Karen Kays M.D.   On: 03/24/2023 14:34    Procedures Procedures    Medications Ordered in ED Medications  aspirin chewable tablet 324 mg (324 mg Oral Given 03/24/23 1402)  ondansetron (ZOFRAN) injection 4 mg (4 mg Intravenous Given 03/24/23 1411)  pantoprazole (PROTONIX) injection 40 mg (40 mg Intravenous Given 03/24/23 1412)    ED Course/ Medical Decision Making/ A&P Clinical Course as of 03/24/23 1655  Thu Mar 24, 2023  1559 Troponin normal.  Lipase normal.  D-dimer negative.  CBC normal.  Metabolic panel normal [JK]  1600 Chest x-ray without acute findings [JK]  1600 Ultrasound without evidence of gallstones [JK]    Clinical Course User Index [JK] Linwood Dibbles, MD                             Medical Decision Making Differential diagnosis includes but not limited to pneumonia, acute coronary syndrome, biliary colic, pancreatitis  Amount and/or Complexity of Data Reviewed Labs: ordered. Radiology: ordered.  Risk OTC drugs. Prescription drug management.   Patient presented to the ED for evaluation of chest pain that occurred after an episode of vomiting.  On exam patient was not wheezing.  No respiratory difficulty.  She was comfortable.  She did  have some mild abdominal tenderness.  Lipase and LFTs are unremarkable.  Ultrasound does not show signs of cholecystitis or cholelithiasis.  Patient's cardiac enzymes are normal.  No evidence of pneumonia or pneumothorax.  It is possible her vomiting caused some esophageal irritation.  Patient has not had any recurrent episodes while she has been in the ED.  Will try her on a course of antacids and antinausea medications.        Final Clinical Impression(s) / ED Diagnoses Final diagnoses:  Chest pain, unspecified type  Nausea and vomiting, unspecified vomiting type    Rx / DC Orders ED Discharge Orders          Ordered    ondansetron (ZOFRAN-ODT) 8 MG disintegrating tablet  Every 8 hours PRN        03/24/23 1652    pantoprazole (PROTONIX) 20 MG tablet  Daily        03/24/23 1652              Linwood Dibbles, MD 03/24/23 1655

## 2023-03-24 NOTE — ED Triage Notes (Signed)
Pt arrived via POV, c/o SOB for several days, has been using inhalers more often. States that she had lunch today, started with some sternal chest pain prior to vomiting, pain continued after vomiting. Denies any fever or any other recent sx. Worsening pain with mvmt.

## 2023-03-25 DIAGNOSIS — K219 Gastro-esophageal reflux disease without esophagitis: Secondary | ICD-10-CM | POA: Diagnosis not present

## 2023-03-25 DIAGNOSIS — E782 Mixed hyperlipidemia: Secondary | ICD-10-CM | POA: Diagnosis not present

## 2023-03-25 DIAGNOSIS — J302 Other seasonal allergic rhinitis: Secondary | ICD-10-CM | POA: Diagnosis not present

## 2023-03-25 DIAGNOSIS — I10 Essential (primary) hypertension: Secondary | ICD-10-CM | POA: Diagnosis not present

## 2023-03-25 DIAGNOSIS — I83893 Varicose veins of bilateral lower extremities with other complications: Secondary | ICD-10-CM | POA: Diagnosis not present

## 2023-03-25 DIAGNOSIS — R7303 Prediabetes: Secondary | ICD-10-CM | POA: Diagnosis not present

## 2023-03-25 DIAGNOSIS — F3341 Major depressive disorder, recurrent, in partial remission: Secondary | ICD-10-CM | POA: Diagnosis not present

## 2023-03-29 ENCOUNTER — Telehealth: Payer: Self-pay

## 2023-03-29 NOTE — Telephone Encounter (Signed)
Transition Care Management Unsuccessful Follow-up Telephone Call  Date of discharge and from where:  03/24/2023 [facility]  Attempts:  1st Attempt  Reason for unsuccessful TCM follow-up call:  Unable to reach patient  Nevia Henkin [facility]  [facility] Population Health Community Resource Care Guide   [email or web address]  ?? [phone]   Website: [email or web address]  [email or web address]

## 2023-03-30 ENCOUNTER — Telehealth: Payer: Self-pay

## 2023-03-30 NOTE — Telephone Encounter (Signed)
Transition Care Management Follow-up Telephone Call Date of discharge and from where: 03/24/2023 [facility] How have you been since you were released from the hospital? Patient is feeling better Any questions or concerns? No  Items Reviewed: Did the pt receive and understand the discharge instructions provided? Yes  Medications obtained and verified? Yes  Other?  Patient consented to [facility] referral to local food pantries Any new allergies since your discharge? No  Dietary orders reviewed? Yes Do you have support at home? Yes   Follow up appointments reviewed:  PCP Hospital f/u appt confirmed? Yes  Scheduled to see Jackie Plum, MD on 03/25/2023 @ [facility] [place] Specialist Hospital f/u appt confirmed? No  Scheduled to see  on  @ . Are transportation arrangements needed? No  If their condition worsens, is the pt aware to call PCP or go to the Emergency Dept.? Yes Was the patient provided with contact information for the PCP's office or ED? Yes Was to pt encouraged to call back with questions or concerns? Yes  Jo Allen [facility]  [facility] Population Health Community Resource Care Guide   [email or web address]  ?? [phone]   Website: [email or web address]  [email or web address]

## 2023-03-31 DIAGNOSIS — M9903 Segmental and somatic dysfunction of lumbar region: Secondary | ICD-10-CM | POA: Diagnosis not present

## 2023-03-31 DIAGNOSIS — M5134 Other intervertebral disc degeneration, thoracic region: Secondary | ICD-10-CM | POA: Diagnosis not present

## 2023-03-31 DIAGNOSIS — M5417 Radiculopathy, lumbosacral region: Secondary | ICD-10-CM | POA: Diagnosis not present

## 2023-03-31 DIAGNOSIS — M5032 Other cervical disc degeneration, mid-cervical region, unspecified level: Secondary | ICD-10-CM | POA: Diagnosis not present

## 2023-03-31 DIAGNOSIS — M9901 Segmental and somatic dysfunction of cervical region: Secondary | ICD-10-CM | POA: Diagnosis not present

## 2023-03-31 DIAGNOSIS — M9902 Segmental and somatic dysfunction of thoracic region: Secondary | ICD-10-CM | POA: Diagnosis not present

## 2023-04-04 DIAGNOSIS — I2510 Atherosclerotic heart disease of native coronary artery without angina pectoris: Secondary | ICD-10-CM | POA: Diagnosis not present

## 2023-04-04 DIAGNOSIS — M9902 Segmental and somatic dysfunction of thoracic region: Secondary | ICD-10-CM | POA: Diagnosis not present

## 2023-04-04 DIAGNOSIS — M5134 Other intervertebral disc degeneration, thoracic region: Secondary | ICD-10-CM | POA: Diagnosis not present

## 2023-04-04 DIAGNOSIS — M5417 Radiculopathy, lumbosacral region: Secondary | ICD-10-CM | POA: Diagnosis not present

## 2023-04-04 DIAGNOSIS — Z6841 Body Mass Index (BMI) 40.0 and over, adult: Secondary | ICD-10-CM | POA: Diagnosis not present

## 2023-04-04 DIAGNOSIS — M9903 Segmental and somatic dysfunction of lumbar region: Secondary | ICD-10-CM | POA: Diagnosis not present

## 2023-04-04 DIAGNOSIS — M5032 Other cervical disc degeneration, mid-cervical region, unspecified level: Secondary | ICD-10-CM | POA: Diagnosis not present

## 2023-04-04 DIAGNOSIS — M9901 Segmental and somatic dysfunction of cervical region: Secondary | ICD-10-CM | POA: Diagnosis not present

## 2023-04-04 DIAGNOSIS — I10 Essential (primary) hypertension: Secondary | ICD-10-CM | POA: Diagnosis not present

## 2023-04-07 ENCOUNTER — Other Ambulatory Visit: Payer: Self-pay | Admitting: *Deleted

## 2023-04-07 DIAGNOSIS — M5134 Other intervertebral disc degeneration, thoracic region: Secondary | ICD-10-CM | POA: Diagnosis not present

## 2023-04-07 DIAGNOSIS — M9901 Segmental and somatic dysfunction of cervical region: Secondary | ICD-10-CM | POA: Diagnosis not present

## 2023-04-07 DIAGNOSIS — M79604 Pain in right leg: Secondary | ICD-10-CM

## 2023-04-07 DIAGNOSIS — E782 Mixed hyperlipidemia: Secondary | ICD-10-CM | POA: Diagnosis not present

## 2023-04-07 DIAGNOSIS — J302 Other seasonal allergic rhinitis: Secondary | ICD-10-CM | POA: Diagnosis not present

## 2023-04-07 DIAGNOSIS — I2510 Atherosclerotic heart disease of native coronary artery without angina pectoris: Secondary | ICD-10-CM | POA: Diagnosis not present

## 2023-04-07 DIAGNOSIS — F3341 Major depressive disorder, recurrent, in partial remission: Secondary | ICD-10-CM | POA: Diagnosis not present

## 2023-04-07 DIAGNOSIS — I10 Essential (primary) hypertension: Secondary | ICD-10-CM | POA: Diagnosis not present

## 2023-04-07 DIAGNOSIS — K219 Gastro-esophageal reflux disease without esophagitis: Secondary | ICD-10-CM | POA: Diagnosis not present

## 2023-04-07 DIAGNOSIS — I83893 Varicose veins of bilateral lower extremities with other complications: Secondary | ICD-10-CM | POA: Diagnosis not present

## 2023-04-07 DIAGNOSIS — R7303 Prediabetes: Secondary | ICD-10-CM | POA: Diagnosis not present

## 2023-04-07 DIAGNOSIS — M5032 Other cervical disc degeneration, mid-cervical region, unspecified level: Secondary | ICD-10-CM | POA: Diagnosis not present

## 2023-04-07 DIAGNOSIS — M5417 Radiculopathy, lumbosacral region: Secondary | ICD-10-CM | POA: Diagnosis not present

## 2023-04-07 DIAGNOSIS — M9902 Segmental and somatic dysfunction of thoracic region: Secondary | ICD-10-CM | POA: Diagnosis not present

## 2023-04-07 DIAGNOSIS — M9903 Segmental and somatic dysfunction of lumbar region: Secondary | ICD-10-CM | POA: Diagnosis not present

## 2023-04-08 ENCOUNTER — Other Ambulatory Visit: Payer: Self-pay | Admitting: *Deleted

## 2023-04-11 ENCOUNTER — Other Ambulatory Visit: Payer: Self-pay | Admitting: *Deleted

## 2023-04-11 DIAGNOSIS — M79604 Pain in right leg: Secondary | ICD-10-CM

## 2023-04-14 ENCOUNTER — Encounter: Payer: Self-pay | Admitting: Cardiology

## 2023-04-14 ENCOUNTER — Ambulatory Visit: Payer: Medicare Other | Admitting: Cardiology

## 2023-04-14 VITALS — BP 133/81 | HR 75 | Resp 16 | Ht 64.0 in | Wt 238.0 lb

## 2023-04-14 DIAGNOSIS — I10 Essential (primary) hypertension: Secondary | ICD-10-CM | POA: Diagnosis not present

## 2023-04-14 DIAGNOSIS — Z6841 Body Mass Index (BMI) 40.0 and over, adult: Secondary | ICD-10-CM | POA: Diagnosis not present

## 2023-04-14 DIAGNOSIS — Z955 Presence of coronary angioplasty implant and graft: Secondary | ICD-10-CM | POA: Diagnosis not present

## 2023-04-14 DIAGNOSIS — I25118 Atherosclerotic heart disease of native coronary artery with other forms of angina pectoris: Secondary | ICD-10-CM | POA: Diagnosis not present

## 2023-04-14 DIAGNOSIS — E782 Mixed hyperlipidemia: Secondary | ICD-10-CM | POA: Diagnosis not present

## 2023-04-14 MED ORDER — METOPROLOL SUCCINATE ER 25 MG PO TB24
25.0000 mg | ORAL_TABLET | Freq: Every day | ORAL | 0 refills | Status: DC
Start: 2023-04-14 — End: 2023-04-29

## 2023-04-14 MED ORDER — ASPIRIN 81 MG PO TBEC
81.0000 mg | DELAYED_RELEASE_TABLET | Freq: Every day | ORAL | 0 refills | Status: DC
Start: 2023-04-14 — End: 2023-04-30

## 2023-04-14 MED ORDER — NITROGLYCERIN 0.4 MG SL SUBL
0.4000 mg | SUBLINGUAL_TABLET | SUBLINGUAL | 0 refills | Status: DC | PRN
Start: 2023-04-14 — End: 2024-08-26

## 2023-04-14 NOTE — Progress Notes (Signed)
ID:  Jo Allen, DOB 1966-10-08, MRN [id number]  PCP:  Norm Salt, PA  Cardiologist:  Tessa Lerner, DO, [facility] (established care 04/14/23)  REASON FOR CONSULT: CAD   REQUESTING PHYSICIAN:  Norm Salt, PA [address] [place],  [place] [address]  Chief Complaint  Patient presents with   Coronary Artery Disease   New Patient (Initial Visit)   Chest Pain    HPI  Jo Allen is a 57 y.o. African-American female who presents to the clinic for evaluation of CAD at the request of Norm Salt, [name]. Her past medical history and cardiovascular risk factors include: Hypertension, asthma, hypertensive heart disease, CAD w/ prior coronary stents, varicose veins, hyperlipidemia, prediabetes, history of carpal tunnel, depression , obesity due to excess calories.   Patient is referred to the practice for evaluation of coronary artery disease.  She currently sees [facility] and during her assessment she voiced that she has had prior cardiac stents.  She now referred to cardiology for further evaluation.   Chest pressure: Started approximately 2 weeks ago. Last for about 30 minutes. Substernal. Intensity: 5/10 Brought on by effort related activities and emotional stress. Self-limited and improved with resting. No recent use of sublingual nitroglycerin tablets. Patient states that the discomfort she is now experiencing could be similar to what she was experiencing 10 years ago when she had a stent placed while living in [place].  I do not have records available to further evaluate her coronary anatomy or coronary interventions back in [place].  She is currently seeing [facility] and with lifestyle changes and pharmacological therapy has lost 14 pounds.  This change  FUNCTIONAL STATUS: No structured exercise program or daily routine.   CARDIAC DATABASE: EKG: April 14, 2023: Sinus rhythm, 70 bpm, without underlying ischemia or  injury pattern.  Echocardiogram: No results found for this or any previous visit from the past 1095 days.   Stress Testing: No results found for this or any previous visit from the past 1095 days.  Heart Catheterization: More than 10 years ago in [place].    ALLERGIES: Allergies  Allergen Reactions   Morphine And Codeine Nausea And Vomiting, Hives and Itching   Trazodone Nausea Only    Other Reaction(s): dizziness, headache   Penicillins Hives    Has patient had a PCN reaction causing immediate rash, facial/tongue/throat swelling, SOB or lightheadedness with hypotension: Yes  Has patient had a PCN reaction causing severe rash involving mucus membranes or skin necrosis: Yes  Has patient had a PCN reaction that required hospitalization No  Has patient had a PCN reaction occurring within the last 10 years: No  If all of the above answers are "NO", then may proceed with Cephalosporin use.  Has patient had a PCN reaction causing immediate rash, facial/tongue/throat swelling, SOB or lightheadedness with hypotension: Yes  Has patient had a PCN reaction causing severe rash involving mucus membranes or skin necrosis: Yes  Has patient had a PCN reaction that required hospitalization No  Has patient had a PCN reaction occurring within the last 10 years: No  If all of the above answers are "NO", then may proceed with Cephalosporin use.   Tramadol     Upset stomach  Other Reaction(s): Other   Adrenal Cortex Extract     Other Reaction(s): Not available    MEDICATION LIST PRIOR TO VISIT: Current Meds  Medication Sig   acetaminophen (TYLENOL 8 HOUR) 650 MG CR tablet Take 1 tablet (  650 mg total) by mouth every 8 (eight) hours as needed.   albuterol (VENTOLIN HFA) 108 (90 Base) MCG/ACT inhaler Inhale 1-2 puffs into the lungs every 6 (six) hours as needed for wheezing or shortness of breath.   amLODipine (NORVASC) 5 MG tablet Take 5 mg by mouth daily.   aspirin EC 81 MG tablet Take 1  tablet (81 mg total) by mouth daily. Swallow whole.   Biotin 1000 MCG CHEW Chew 5,000 mcg by mouth daily.   Cholecalciferol (VITAMIN D3) 25 MCG (1000 UT) CAPS Take 2,000 Units by mouth daily.   CRANBERRY PO Take 1 tablet by mouth daily.   doxepin (SINEQUAN) 25 MG capsule Take 25-50 mg by mouth at bedtime as needed (sleep aid).    ferrous sulfate 325 (65 FE) MG EC tablet Take 325 mg by mouth daily with breakfast.   Ginkgo 60 MG TABS Take 60 mg by mouth daily.   ibuprofen (ADVIL) 600 MG tablet Take 1 tablet (600 mg total) by mouth every 6 (six) hours as needed.   Lactobacillus (PROBIOTIC ACIDOPHILUS PO) Take 1 tablet by mouth daily.   metoprolol succinate (TOPROL XL) 25 MG 24 hr tablet Take 1 tablet (25 mg total) by mouth daily.   Multiple Vitamins-Minerals (ONE-A-DAY WOMENS PO) Take 1 tablet by mouth daily.   nitroGLYCERIN (NITROSTAT) 0.4 MG SL tablet Place 1 tablet (0.4 mg total) under the tongue every 5 (five) minutes as needed for chest pain. If you require more than two tablets five minutes apart go to the nearest ER via EMS.   ondansetron (ZOFRAN-ODT) 8 MG disintegrating tablet Take 1 tablet (8 mg total) by mouth every 8 (eight) hours as needed for nausea or vomiting.   pantoprazole (PROTONIX) 20 MG tablet Take 1 tablet (20 mg total) by mouth daily.   pregabalin (LYRICA) 50 MG capsule Take 1 capsule (50 mg total) by mouth 3 (three) times daily.   rosuvastatin (CRESTOR) 20 MG tablet Take 20 mg by mouth daily.   tiZANidine (ZANAFLEX) 2 MG tablet Take 1-2 tablets (2-4 mg total) by mouth every 6 (six) hours as needed for muscle spasms.   vitamin B-12 (CYANOCOBALAMIN) 1000 MCG tablet Take 1,000 mcg by mouth daily.   vitamin E 100 UNIT capsule Take 300 Units by mouth daily.      PAST MEDICAL HISTORY: Past Medical History:  Diagnosis Date   Asthma    CAD (coronary artery disease)    GERD (gastroesophageal reflux disease)    Headache    Heart disease    Hypertension    Obesity     Paresthesia 08/20/2015   Prediabetes    Transfusion history    49yrs ago    PAST SURGICAL HISTORY: Past Surgical History:  Procedure Laterality Date   ANGIOPLASTY     BREAST BIOPSY Left    BREAST BIOPSY WITH SENTINEL LYMPH NODE BIOPSY AND NEEDLE LOCALIZATION Left    COLONOSCOPY WITH PROPOFOL N/A 02/19/2014   Procedure: COLONOSCOPY WITH PROPOFOL;  Surgeon: Charolett Bumpers, MD;  Location: [facility];  Service: Endoscopy;  Laterality: N/A;   JOINT REPLACEMENT Bilateral    TOTAL KNEE ARTHROPLASTY Bilateral     FAMILY HISTORY: The patient family history includes Depression in her brother; Diabetes in her mother; Headache in her sister; Hypertension in her mother; Stroke in her father.  SOCIAL HISTORY:  The patient  reports that she has never smoked. She has never used smokeless tobacco. She reports that she does not drink alcohol and does not use  drugs.  REVIEW OF SYSTEMS: Review of Systems  Cardiovascular:  Positive for chest pain. Negative for claudication, dyspnea on exertion, irregular heartbeat, leg swelling, near-syncope, orthopnea, palpitations, paroxysmal nocturnal dyspnea and syncope.  Hematologic/Lymphatic: Negative for bleeding problem.  Musculoskeletal:  Negative for muscle cramps and myalgias.  Neurological:  Positive for light-headedness. Negative for dizziness.    PHYSICAL EXAM:    04/14/2023   10:36 AM 03/24/2023    2:35 PM 03/24/2023   12:58 PM  Vitals with BMI  Height 5\' 4"   5\' 4"   Weight 238 lbs  240 lbs  BMI 40.83  41.18  Systolic 133 120 161  Diastolic 81 62 83  Pulse 75 83 76    Physical Exam  Constitutional: No distress.  Age appropriate, hemodynamically stable.   Neck: No JVD present.  Cardiovascular: Normal rate, regular rhythm, S1 normal, S2 normal, intact distal pulses and normal pulses. Exam reveals no gallop, no S3 and no S4.  No murmur heard. Pulmonary/Chest: Effort normal and breath sounds normal. No stridor. She has no wheezes. She has no  rales.  Abdominal: Soft. Bowel sounds are normal. She exhibits no distension. There is no abdominal tenderness.  Musculoskeletal:        General: No edema.     Cervical back: Neck supple.  Neurological: She is alert and oriented to person, place, and time. She has intact cranial nerves (2-12).  Skin: Skin is warm and moist.   LABORATORY DATA: External Labs: Collected: Mar 01, 2023 provided by referring physician. BUN 12, creatinine 0.79. eGFR 87. Sodium 142, potassium 4.5, chloride 106, bicarb 28     Latest Ref Rng & Units 03/24/2023    2:15 PM 06/30/2021   11:48 PM 05/24/2021   10:51 AM  CBC  WBC 4.0 - 10.5 K/uL 6.2  7.5  4.9   Hemoglobin 12.0 - 15.0 g/dL 09.6  04.5  40.9   Hematocrit 36.0 - 46.0 % 42.6  43.0  41.2   Platelets 150 - 400 K/uL 260  275  227        Latest Ref Rng & Units 03/24/2023    2:15 PM 06/30/2021   11:48 PM 05/24/2021   10:51 AM  CMP  Glucose 70 - 99 mg/dL 96  811  914   BUN 6 - 20 mg/dL 7  13  10    Creatinine 0.44 - 1.00 mg/dL 7.82  9.56  2.13   Sodium 135 - 145 mmol/L 142  137  140   Potassium 3.5 - 5.1 mmol/L 3.8  3.6  3.9   Chloride 98 - 111 mmol/L 109  102  104   CO2 22 - 32 mmol/L 28  29  28    Calcium 8.9 - 10.3 mg/dL 9.1  9.4  9.7   Total Protein 6.5 - 8.1 g/dL 5.8  7.6  7.0   Total Bilirubin 0.3 - 1.2 mg/dL 0.5  0.4  0.4   Alkaline Phos 38 - 126 U/L 83  105  92   AST 15 - 41 U/L 22  22  19    ALT 0 - 44 U/L 20  18  17      No results found for: "CHOL", "HDL", "LDLCALC", "LDLDIRECT", "TRIG", "CHOLHDL" No components found for: "NTPROBNP" No results for input(s): "PROBNP" in the last 8760 hours. No results for input(s): "TSH" in the last 8760 hours.  BMP Recent Labs    03/24/23 1415  NA 142  K 3.8  CL 109  CO2 28  GLUCOSE 96  BUN 7  CREATININE 0.85  CALCIUM 9.1  GFRNONAA >60    HEMOGLOBIN A1C No results found for: "HGBA1C", "MPG"  IMPRESSION:    ICD-10-CM   1. Coronary artery disease involving native coronary artery of native  heart with other form of angina pectoris (HCC)  I25.118 EKG 12-Lead    PCV ECHOCARDIOGRAM COMPLETE    nitroGLYCERIN (NITROSTAT) 0.4 MG SL tablet    metoprolol succinate (TOPROL XL) 25 MG 24 hr tablet    aspirin EC 81 MG tablet    CBC    CMP14+EGFR    Lipid Panel With LDL/HDL Ratio    LDL cholesterol, direct    2. Coronary stent patent  Z95.5 PCV ECHOCARDIOGRAM COMPLETE    nitroGLYCERIN (NITROSTAT) 0.4 MG SL tablet    metoprolol succinate (TOPROL XL) 25 MG 24 hr tablet    aspirin EC 81 MG tablet    3. Benign hypertension  I10     4. Mixed hyperlipidemia  E78.2     5. Class 3 severe obesity due to excess calories with serious comorbidity and body mass index (BMI) of 40.0 to 44.9 in adult [facility])  E66.01    Z68.41        RECOMMENDATIONS: Kenijah Benningfield is a 57 y.o. African-American female whose past medical history and cardiac risk factors include: Hypertension, asthma, hypertensive heart disease, CAD w/ prior coronary stents, varicose veins, hyperlipidemia, prediabetes, history of carpal tunnel, depression , obesity due to excess calories.   Coronary artery disease involving native coronary artery of native heart with other form of angina pectoris [facility]) Coronary stent patent Cardiac discomfort suggestive of angina pectoris. Currently asymptomatic. EKG nonischemic. In the setting of anginal chest pain and risk factors which include but not limited to: premature CAD, prior PCI, hyperlipidemia, etc. recommended ischemic workup. Discussed ischemic evaluation with either stress test or angiography-reviewed the risks, benefits, alternatives, limitations. Patient would like to proceed with invasive angiography with possible intervention. Start aspirin 81 mg p.o. daily. Start Toprol-XL 25 mg p.o. daily. Sublingual nitroglycerin tablets to use on a as needed basis. Educated on seeking medical attention sooner by going to the closest ER via EMS if the symptoms increase in intensity,  frequency, duration, or has typical chest pain as discussed in the office.  Patient verbalized understanding.  The procedure of left heart catheterization with possible intervention was explained to the patient in detail.  The indication, alternatives, risks and benefits were reviewed.  Complications include but not limited to bleeding, infection, vascular injury, stroke, myocardial infarction, arrhythmia (requiring medical or cardiopulmonary resuscitation), kidney injury (requiring short-term or long-term hemodialysis), radiation-related injury in the case of prolonged fluoroscopy use, emergent cardiac surgery, temporary or permanent pacemaker, and death. The patient understands the risks of serious complication is 1-2 in 1000 with diagnostic cardiac cath and 1-2% or less with angioplasty/stenting.  The patient voices understanding and provides verbal feedback her questions and concerns are addressed to her satisfaction and patient wishes to proceed with coronary angiography with possible PCI.  Benign hypertension Office blood pressures are within acceptable limits. Medications reconciled. No additional changes warranted at this time. Currently managed by primary care provider.  Mixed hyperlipidemia Currently on rosuvastatin.   She denies myalgia or other side effects. No recent lipids available for review. Will check fasting lipids, direct LDL, CMP as part of precath workup. Currently managed by primary care provider.   Class 3 severe obesity due to excess calories with serious comorbidity and body mass index (BMI) of 40.0 to  44.9 in adult [facility]) Body mass index is 40.85 kg/m. Currently enrolled into weight loss clinic. Currently on Zepbound -has lost 14 pounds. I reviewed with her importance of diet, regular physical activity/exercise, weight loss.   Until the heart catheterization is not completed recommended avoiding overexertion.  Data Reviewed: I have independently reviewed  external notes provided by the referring provider as part of this office visit.   I have independently reviewed results of EKG, Labs,  as part of medical decision making. I have ordered the following tests:  Orders Placed This Encounter  Procedures   CBC   CMP14+EGFR   Lipid Panel With LDL/HDL Ratio   LDL cholesterol, direct   EKG 12-Lead   PCV ECHOCARDIOGRAM COMPLETE    Standing Status:   Future    Standing Expiration Date:   04/13/2024  I have made medications changes at today's encounter as noted above.  FINAL MEDICATION LIST END OF ENCOUNTER: Meds ordered this encounter  Medications   nitroGLYCERIN (NITROSTAT) 0.4 MG SL tablet    Sig: Place 1 tablet (0.4 mg total) under the tongue every 5 (five) minutes as needed for chest pain. If you require more than two tablets five minutes apart go to the nearest ER via EMS.    Dispense:  30 tablet    Refill:  0   metoprolol succinate (TOPROL XL) 25 MG 24 hr tablet    Sig: Take 1 tablet (25 mg total) by mouth daily.    Dispense:  90 tablet    Refill:  0   aspirin EC 81 MG tablet    Sig: Take 1 tablet (81 mg total) by mouth daily. Swallow whole.    Dispense:  90 tablet    Refill:  0    Medications Discontinued During This Encounter  Medication Reason   azithromycin (ZITHROMAX) 250 MG tablet    celecoxib (CELEBREX) 100 MG capsule    hydrochlorothiazide (HYDRODIURIL) 25 MG tablet    Krill Oil (OMEGA-3) 500 MG CAPS    methocarbamol (ROBAXIN) 500 MG tablet    montelukast (SINGULAIR) 10 MG tablet    phentermine 37.5 MG capsule    verapamil (VERELAN PM) 360 MG 24 hr capsule      Current Outpatient Medications:    acetaminophen (TYLENOL 8 HOUR) 650 MG CR tablet, Take 1 tablet (650 mg total) by mouth every 8 (eight) hours as needed., Disp: 30 tablet, Rfl: 0   albuterol (VENTOLIN HFA) 108 (90 Base) MCG/ACT inhaler, Inhale 1-2 puffs into the lungs every 6 (six) hours as needed for wheezing or shortness of breath., Disp: 1 each, Rfl: 0    amLODipine (NORVASC) 5 MG tablet, Take 5 mg by mouth daily., Disp: , Rfl:    aspirin EC 81 MG tablet, Take 1 tablet (81 mg total) by mouth daily. Swallow whole., Disp: 90 tablet, Rfl: 0   Biotin 1000 MCG CHEW, Chew 5,000 mcg by mouth daily., Disp: , Rfl:    Cholecalciferol (VITAMIN D3) 25 MCG (1000 UT) CAPS, Take 2,000 Units by mouth daily., Disp: , Rfl:    CRANBERRY PO, Take 1 tablet by mouth daily., Disp: , Rfl:    doxepin (SINEQUAN) 25 MG capsule, Take 25-50 mg by mouth at bedtime as needed (sleep aid). , Disp: , Rfl:    ferrous sulfate 325 (65 FE) MG EC tablet, Take 325 mg by mouth daily with breakfast., Disp: , Rfl:    Ginkgo 60 MG TABS, Take 60 mg by mouth daily., Disp: , Rfl:  ibuprofen (ADVIL) 600 MG tablet, Take 1 tablet (600 mg total) by mouth every 6 (six) hours as needed., Disp: 30 tablet, Rfl: 0   Lactobacillus (PROBIOTIC ACIDOPHILUS PO), Take 1 tablet by mouth daily., Disp: , Rfl:    metoprolol succinate (TOPROL XL) 25 MG 24 hr tablet, Take 1 tablet (25 mg total) by mouth daily., Disp: 90 tablet, Rfl: 0   Multiple Vitamins-Minerals (ONE-A-DAY WOMENS PO), Take 1 tablet by mouth daily., Disp: , Rfl:    nitroGLYCERIN (NITROSTAT) 0.4 MG SL tablet, Place 1 tablet (0.4 mg total) under the tongue every 5 (five) minutes as needed for chest pain. If you require more than two tablets five minutes apart go to the nearest ER via EMS., Disp: 30 tablet, Rfl: 0   ondansetron (ZOFRAN-ODT) 8 MG disintegrating tablet, Take 1 tablet (8 mg total) by mouth every 8 (eight) hours as needed for nausea or vomiting., Disp: 12 tablet, Rfl: 0   pantoprazole (PROTONIX) 20 MG tablet, Take 1 tablet (20 mg total) by mouth daily., Disp: 14 tablet, Rfl: 0   pregabalin (LYRICA) 50 MG capsule, Take 1 capsule (50 mg total) by mouth 3 (three) times daily., Disp: 60 capsule, Rfl: 5   rosuvastatin (CRESTOR) 20 MG tablet, Take 20 mg by mouth daily., Disp: , Rfl:    tiZANidine (ZANAFLEX) 2 MG tablet, Take 1-2 tablets (2-4 mg  total) by mouth every 6 (six) hours as needed for muscle spasms., Disp: 30 tablet, Rfl: 0   vitamin B-12 (CYANOCOBALAMIN) 1000 MCG tablet, Take 1,000 mcg by mouth daily., Disp: , Rfl:    vitamin E 100 UNIT capsule, Take 300 Units by mouth daily. , Disp: , Rfl:    buPROPion (WELLBUTRIN XL) 150 MG 24 hr tablet, Take 150 mg by mouth 3 (three) times daily. (Patient not taking: Reported on 04/14/2023), Disp: , Rfl:    DULoxetine (CYMBALTA) 30 MG capsule, Take 30 mg by mouth 2 (two) times daily. (Patient not taking: Reported on 04/14/2023), Disp: , Rfl:    lamoTRIgine (LAMICTAL) 150 MG tablet, Take 150 mg by mouth 2 (two) times daily. (Patient not taking: Reported on 04/14/2023), Disp: , Rfl:   Orders Placed This Encounter  Procedures   CBC   CMP14+EGFR   Lipid Panel With LDL/HDL Ratio   LDL cholesterol, direct   EKG 12-Lead   PCV ECHOCARDIOGRAM COMPLETE    There are no Patient Instructions on file for this visit.   --Continue cardiac medications as reconciled in final medication list. --Return in about 3 weeks (around 05/05/2023) for Follow up after heart cath. or sooner if needed. --Continue follow-up with your primary care physician regarding the management of your other chronic comorbid conditions.  Patient's questions and concerns were addressed to her satisfaction. She voices understanding of the instructions provided during this encounter.   This note was created using a voice recognition software as a result there may be grammatical errors inadvertently enclosed that do not reflect the nature of this encounter. Every attempt is made to correct such errors.  Tessa Lerner, [place], [facility]  Pager:  [phone] Office: [phone]

## 2023-04-14 NOTE — H&P (View-Only) (Signed)
 ID:  Jo Allen, DOB 06/27/1966, MRN [id number]  PCP:  Vanstory, Ashley N, PA  Cardiologist:  Tou Hayner, DO, FACC (established care 04/14/23)  REASON FOR CONSULT: CAD   REQUESTING PHYSICIAN:  Vanstory, Ashley N, PA [address] [place],  [place] [address]  Chief Complaint  Patient presents with   Coronary Artery Disease   New Patient (Initial Visit)   Chest Pain    HPI  Jo Allen is a 56 y.o. African-American female who presents to the clinic for evaluation of CAD at the request of Vanstory, Ashley N, PA. Her past medical history and cardiovascular risk factors include: Hypertension, asthma, hypertensive heart disease, CAD w/ prior coronary stents, varicose veins, hyperlipidemia, prediabetes, history of carpal tunnel, depression , obesity due to excess calories.   Patient is referred to the practice for evaluation of coronary artery disease.  She currently sees [facility] and during her assessment she voiced that she has had prior cardiac stents.  She now referred to cardiology for further evaluation.   Chest pressure: Started approximately 2 weeks ago. Last for about 30 minutes. Substernal. Intensity: 5/10 Brought on by effort related activities and emotional stress. Self-limited and improved with resting. No recent use of sublingual nitroglycerin tablets. Patient states that the discomfort she is now experiencing could be similar to what she was experiencing 10 years ago when she had a stent placed while living in [place].  I do not have records available to further evaluate her coronary anatomy or coronary interventions back in [place].  She is currently seeing [facility] and with lifestyle changes and pharmacological therapy has lost 14 pounds.  This change  FUNCTIONAL STATUS: No structured exercise program or daily routine.   CARDIAC DATABASE: EKG: April 14, 2023: Sinus rhythm, 70 bpm, without underlying ischemia or  injury pattern.  Echocardiogram: No results found for this or any previous visit from the past 1095 days.   Stress Testing: No results found for this or any previous visit from the past 1095 days.  Heart Catheterization: More than 10 years ago in [place].    ALLERGIES: Allergies  Allergen Reactions   Morphine And Codeine Nausea And Vomiting, Hives and Itching   Trazodone Nausea Only    Other Reaction(s): dizziness, headache   Penicillins Hives    Has patient had a PCN reaction causing immediate rash, facial/tongue/throat swelling, SOB or lightheadedness with hypotension: Yes  Has patient had a PCN reaction causing severe rash involving mucus membranes or skin necrosis: Yes  Has patient had a PCN reaction that required hospitalization No  Has patient had a PCN reaction occurring within the last 10 years: No  If all of the above answers are "NO", then may proceed with Cephalosporin use.  Has patient had a PCN reaction causing immediate rash, facial/tongue/throat swelling, SOB or lightheadedness with hypotension: Yes  Has patient had a PCN reaction causing severe rash involving mucus membranes or skin necrosis: Yes  Has patient had a PCN reaction that required hospitalization No  Has patient had a PCN reaction occurring within the last 10 years: No  If all of the above answers are "NO", then may proceed with Cephalosporin use.   Tramadol     Upset stomach  Other Reaction(s): Other   Adrenal Cortex Extract     Other Reaction(s): Not available    MEDICATION LIST PRIOR TO VISIT: Current Meds  Medication Sig   acetaminophen (TYLENOL 8 HOUR) 650 MG CR tablet Take 1 tablet (  650 mg total) by mouth every 8 (eight) hours as needed.   albuterol (VENTOLIN HFA) 108 (90 Base) MCG/ACT inhaler Inhale 1-2 puffs into the lungs every 6 (six) hours as needed for wheezing or shortness of breath.   amLODipine (NORVASC) 5 MG tablet Take 5 mg by mouth daily.   aspirin EC 81 MG tablet Take 1  tablet (81 mg total) by mouth daily. Swallow whole.   Biotin 1000 MCG CHEW Chew 5,000 mcg by mouth daily.   Cholecalciferol (VITAMIN D3) 25 MCG (1000 UT) CAPS Take 2,000 Units by mouth daily.   CRANBERRY PO Take 1 tablet by mouth daily.   doxepin (SINEQUAN) 25 MG capsule Take 25-50 mg by mouth at bedtime as needed (sleep aid).    ferrous sulfate 325 (65 FE) MG EC tablet Take 325 mg by mouth daily with breakfast.   Ginkgo 60 MG TABS Take 60 mg by mouth daily.   ibuprofen (ADVIL) 600 MG tablet Take 1 tablet (600 mg total) by mouth every 6 (six) hours as needed.   Lactobacillus (PROBIOTIC ACIDOPHILUS PO) Take 1 tablet by mouth daily.   metoprolol succinate (TOPROL XL) 25 MG 24 hr tablet Take 1 tablet (25 mg total) by mouth daily.   Multiple Vitamins-Minerals (ONE-A-DAY WOMENS PO) Take 1 tablet by mouth daily.   nitroGLYCERIN (NITROSTAT) 0.4 MG SL tablet Place 1 tablet (0.4 mg total) under the tongue every 5 (five) minutes as needed for chest pain. If you require more than two tablets five minutes apart go to the nearest ER via EMS.   ondansetron (ZOFRAN-ODT) 8 MG disintegrating tablet Take 1 tablet (8 mg total) by mouth every 8 (eight) hours as needed for nausea or vomiting.   pantoprazole (PROTONIX) 20 MG tablet Take 1 tablet (20 mg total) by mouth daily.   pregabalin (LYRICA) 50 MG capsule Take 1 capsule (50 mg total) by mouth 3 (three) times daily.   rosuvastatin (CRESTOR) 20 MG tablet Take 20 mg by mouth daily.   tiZANidine (ZANAFLEX) 2 MG tablet Take 1-2 tablets (2-4 mg total) by mouth every 6 (six) hours as needed for muscle spasms.   vitamin B-12 (CYANOCOBALAMIN) 1000 MCG tablet Take 1,000 mcg by mouth daily.   vitamin E 100 UNIT capsule Take 300 Units by mouth daily.      PAST MEDICAL HISTORY: Past Medical History:  Diagnosis Date   Asthma    CAD (coronary artery disease)    GERD (gastroesophageal reflux disease)    Headache    Heart disease    Hypertension    Obesity     Paresthesia 08/20/2015   Prediabetes    Transfusion history    5yrs ago    PAST SURGICAL HISTORY: Past Surgical History:  Procedure Laterality Date   ANGIOPLASTY     BREAST BIOPSY Left    BREAST BIOPSY WITH SENTINEL LYMPH NODE BIOPSY AND NEEDLE LOCALIZATION Left    COLONOSCOPY WITH PROPOFOL N/A 02/19/2014   Procedure: COLONOSCOPY WITH PROPOFOL;  Surgeon: Martin K Johnson, MD;  Location: [facility];  Service: Endoscopy;  Laterality: N/A;   JOINT REPLACEMENT Bilateral    TOTAL KNEE ARTHROPLASTY Bilateral     FAMILY HISTORY: The patient family history includes Depression in her brother; Diabetes in her mother; Headache in her sister; Hypertension in her mother; Stroke in her father.  SOCIAL HISTORY:  The patient  reports that she has never smoked. She has never used smokeless tobacco. She reports that she does not drink alcohol and does not use   drugs.  REVIEW OF SYSTEMS: Review of Systems  Cardiovascular:  Positive for chest pain. Negative for claudication, dyspnea on exertion, irregular heartbeat, leg swelling, near-syncope, orthopnea, palpitations, paroxysmal nocturnal dyspnea and syncope.  Hematologic/Lymphatic: Negative for bleeding problem.  Musculoskeletal:  Negative for muscle cramps and myalgias.  Neurological:  Positive for light-headedness. Negative for dizziness.    PHYSICAL EXAM:    04/14/2023   10:36 AM 03/24/2023    2:35 PM 03/24/2023   12:58 PM  Vitals with BMI  Height 5' 4"  5' 4"  Weight 238 lbs  240 lbs  BMI 40.83  41.18  Systolic 133 120 187  Diastolic 81 62 83  Pulse 75 83 76    Physical Exam  Constitutional: No distress.  Age appropriate, hemodynamically stable.   Neck: No JVD present.  Cardiovascular: Normal rate, regular rhythm, S1 normal, S2 normal, intact distal pulses and normal pulses. Exam reveals no gallop, no S3 and no S4.  No murmur heard. Pulmonary/Chest: Effort normal and breath sounds normal. No stridor. She has no wheezes. She has no  rales.  Abdominal: Soft. Bowel sounds are normal. She exhibits no distension. There is no abdominal tenderness.  Musculoskeletal:        General: No edema.     Cervical back: Neck supple.  Neurological: She is alert and oriented to person, place, and time. She has intact cranial nerves (2-12).  Skin: Skin is warm and moist.   LABORATORY DATA: External Labs: Collected: Mar 01, 2023 provided by referring physician. BUN 12, creatinine 0.79. eGFR 87. Sodium 142, potassium 4.5, chloride 106, bicarb 28     Latest Ref Rng & Units 03/24/2023    2:15 PM 06/30/2021   11:48 PM 05/24/2021   10:51 AM  CBC  WBC 4.0 - 10.5 K/uL 6.2  7.5  4.9   Hemoglobin 12.0 - 15.0 g/dL 13.2  13.4  13.0   Hematocrit 36.0 - 46.0 % 42.6  43.0  41.2   Platelets 150 - 400 K/uL 260  275  227        Latest Ref Rng & Units 03/24/2023    2:15 PM 06/30/2021   11:48 PM 05/24/2021   10:51 AM  CMP  Glucose 70 - 99 mg/dL 96  104  101   BUN 6 - 20 mg/dL 7  13  10   Creatinine 0.44 - 1.00 mg/dL 0.85  0.67  0.67   Sodium 135 - 145 mmol/L 142  137  140   Potassium 3.5 - 5.1 mmol/L 3.8  3.6  3.9   Chloride 98 - 111 mmol/L 109  102  104   CO2 22 - 32 mmol/L 28  29  28   Calcium 8.9 - 10.3 mg/dL 9.1  9.4  9.7   Total Protein 6.5 - 8.1 g/dL 5.8  7.6  7.0   Total Bilirubin 0.3 - 1.2 mg/dL 0.5  0.4  0.4   Alkaline Phos 38 - 126 U/L 83  105  92   AST 15 - 41 U/L 22  22  19   ALT 0 - 44 U/L 20  18  17     No results found for: "CHOL", "HDL", "LDLCALC", "LDLDIRECT", "TRIG", "CHOLHDL" No components found for: "NTPROBNP" No results for input(s): "PROBNP" in the last 8760 hours. No results for input(s): "TSH" in the last 8760 hours.  BMP Recent Labs    03/24/23 1415  NA 142  K 3.8  CL 109  CO2 28  GLUCOSE 96    BUN 7  CREATININE 0.85  CALCIUM 9.1  GFRNONAA >60    HEMOGLOBIN A1C No results found for: "HGBA1C", "MPG"  IMPRESSION:    ICD-10-CM   1. Coronary artery disease involving native coronary artery of native  heart with other form of angina pectoris (HCC)  I25.118 EKG 12-Lead    PCV ECHOCARDIOGRAM COMPLETE    nitroGLYCERIN (NITROSTAT) 0.4 MG SL tablet    metoprolol succinate (TOPROL XL) 25 MG 24 hr tablet    aspirin EC 81 MG tablet    CBC    CMP14+EGFR    Lipid Panel With LDL/HDL Ratio    LDL cholesterol, direct    2. Coronary stent patent  Z95.5 PCV ECHOCARDIOGRAM COMPLETE    nitroGLYCERIN (NITROSTAT) 0.4 MG SL tablet    metoprolol succinate (TOPROL XL) 25 MG 24 hr tablet    aspirin EC 81 MG tablet    3. Benign hypertension  I10     4. Mixed hyperlipidemia  E78.2     5. Class 3 severe obesity due to excess calories with serious comorbidity and body mass index (BMI) of 40.0 to 44.9 in adult (HCC)  E66.01    Z68.41        RECOMMENDATIONS: Jo Allen is a 56 y.o. African-American female whose past medical history and cardiac risk factors include: Hypertension, asthma, hypertensive heart disease, CAD w/ prior coronary stents, varicose veins, hyperlipidemia, prediabetes, history of carpal tunnel, depression , obesity due to excess calories.   Coronary artery disease involving native coronary artery of native heart with other form of angina pectoris (HCC) Coronary stent patent Cardiac discomfort suggestive of angina pectoris. Currently asymptomatic. EKG nonischemic. In the setting of anginal chest pain and risk factors which include but not limited to: premature CAD, prior PCI, hyperlipidemia, etc. recommended ischemic workup. Discussed ischemic evaluation with either stress test or angiography-reviewed the risks, benefits, alternatives, limitations. Patient would like to proceed with invasive angiography with possible intervention. Start aspirin 81 mg p.o. daily. Start Toprol-XL 25 mg p.o. daily. Sublingual nitroglycerin tablets to use on a as needed basis. Educated on seeking medical attention sooner by going to the closest ER via EMS if the symptoms increase in intensity,  frequency, duration, or has typical chest pain as discussed in the office.  Patient verbalized understanding.  The procedure of left heart catheterization with possible intervention was explained to the patient in detail.  The indication, alternatives, risks and benefits were reviewed.  Complications include but not limited to bleeding, infection, vascular injury, stroke, myocardial infarction, arrhythmia (requiring medical or cardiopulmonary resuscitation), kidney injury (requiring short-term or long-term hemodialysis), radiation-related injury in the case of prolonged fluoroscopy use, emergent cardiac surgery, temporary or permanent pacemaker, and death. The patient understands the risks of serious complication is 1-2 in 1000 with diagnostic cardiac cath and 1-2% or less with angioplasty/stenting.  The patient voices understanding and provides verbal feedback her questions and concerns are addressed to her satisfaction and patient wishes to proceed with coronary angiography with possible PCI.  Benign hypertension Office blood pressures are within acceptable limits. Medications reconciled. No additional changes warranted at this time. Currently managed by primary care provider.  Mixed hyperlipidemia Currently on rosuvastatin.   She denies myalgia or other side effects. No recent lipids available for review. Will check fasting lipids, direct LDL, CMP as part of precath workup. Currently managed by primary care provider.   Class 3 severe obesity due to excess calories with serious comorbidity and body mass index (BMI) of 40.0 to   44.9 in adult (HCC) Body mass index is 40.85 kg/m. Currently enrolled into weight loss clinic. Currently on Zepbound -has lost 14 pounds. I reviewed with her importance of diet, regular physical activity/exercise, weight loss.   Until the heart catheterization is not completed recommended avoiding overexertion.  Data Reviewed: I have independently reviewed  external notes provided by the referring provider as part of this office visit.   I have independently reviewed results of EKG, Labs,  as part of medical decision making. I have ordered the following tests:  Orders Placed This Encounter  Procedures   CBC   CMP14+EGFR   Lipid Panel With LDL/HDL Ratio   LDL cholesterol, direct   EKG 12-Lead   PCV ECHOCARDIOGRAM COMPLETE    Standing Status:   Future    Standing Expiration Date:   04/13/2024  I have made medications changes at today's encounter as noted above.  FINAL MEDICATION LIST END OF ENCOUNTER: Meds ordered this encounter  Medications   nitroGLYCERIN (NITROSTAT) 0.4 MG SL tablet    Sig: Place 1 tablet (0.4 mg total) under the tongue every 5 (five) minutes as needed for chest pain. If you require more than two tablets five minutes apart go to the nearest ER via EMS.    Dispense:  30 tablet    Refill:  0   metoprolol succinate (TOPROL XL) 25 MG 24 hr tablet    Sig: Take 1 tablet (25 mg total) by mouth daily.    Dispense:  90 tablet    Refill:  0   aspirin EC 81 MG tablet    Sig: Take 1 tablet (81 mg total) by mouth daily. Swallow whole.    Dispense:  90 tablet    Refill:  0    Medications Discontinued During This Encounter  Medication Reason   azithromycin (ZITHROMAX) 250 MG tablet    celecoxib (CELEBREX) 100 MG capsule    hydrochlorothiazide (HYDRODIURIL) 25 MG tablet    Krill Oil (OMEGA-3) 500 MG CAPS    methocarbamol (ROBAXIN) 500 MG tablet    montelukast (SINGULAIR) 10 MG tablet    phentermine 37.5 MG capsule    verapamil (VERELAN PM) 360 MG 24 hr capsule      Current Outpatient Medications:    acetaminophen (TYLENOL 8 HOUR) 650 MG CR tablet, Take 1 tablet (650 mg total) by mouth every 8 (eight) hours as needed., Disp: 30 tablet, Rfl: 0   albuterol (VENTOLIN HFA) 108 (90 Base) MCG/ACT inhaler, Inhale 1-2 puffs into the lungs every 6 (six) hours as needed for wheezing or shortness of breath., Disp: 1 each, Rfl: 0    amLODipine (NORVASC) 5 MG tablet, Take 5 mg by mouth daily., Disp: , Rfl:    aspirin EC 81 MG tablet, Take 1 tablet (81 mg total) by mouth daily. Swallow whole., Disp: 90 tablet, Rfl: 0   Biotin 1000 MCG CHEW, Chew 5,000 mcg by mouth daily., Disp: , Rfl:    Cholecalciferol (VITAMIN D3) 25 MCG (1000 UT) CAPS, Take 2,000 Units by mouth daily., Disp: , Rfl:    CRANBERRY PO, Take 1 tablet by mouth daily., Disp: , Rfl:    doxepin (SINEQUAN) 25 MG capsule, Take 25-50 mg by mouth at bedtime as needed (sleep aid). , Disp: , Rfl:    ferrous sulfate 325 (65 FE) MG EC tablet, Take 325 mg by mouth daily with breakfast., Disp: , Rfl:    Ginkgo 60 MG TABS, Take 60 mg by mouth daily., Disp: , Rfl:      ibuprofen (ADVIL) 600 MG tablet, Take 1 tablet (600 mg total) by mouth every 6 (six) hours as needed., Disp: 30 tablet, Rfl: 0   Lactobacillus (PROBIOTIC ACIDOPHILUS PO), Take 1 tablet by mouth daily., Disp: , Rfl:    metoprolol succinate (TOPROL XL) 25 MG 24 hr tablet, Take 1 tablet (25 mg total) by mouth daily., Disp: 90 tablet, Rfl: 0   Multiple Vitamins-Minerals (ONE-A-DAY WOMENS PO), Take 1 tablet by mouth daily., Disp: , Rfl:    nitroGLYCERIN (NITROSTAT) 0.4 MG SL tablet, Place 1 tablet (0.4 mg total) under the tongue every 5 (five) minutes as needed for chest pain. If you require more than two tablets five minutes apart go to the nearest ER via EMS., Disp: 30 tablet, Rfl: 0   ondansetron (ZOFRAN-ODT) 8 MG disintegrating tablet, Take 1 tablet (8 mg total) by mouth every 8 (eight) hours as needed for nausea or vomiting., Disp: 12 tablet, Rfl: 0   pantoprazole (PROTONIX) 20 MG tablet, Take 1 tablet (20 mg total) by mouth daily., Disp: 14 tablet, Rfl: 0   pregabalin (LYRICA) 50 MG capsule, Take 1 capsule (50 mg total) by mouth 3 (three) times daily., Disp: 60 capsule, Rfl: 5   rosuvastatin (CRESTOR) 20 MG tablet, Take 20 mg by mouth daily., Disp: , Rfl:    tiZANidine (ZANAFLEX) 2 MG tablet, Take 1-2 tablets (2-4 mg  total) by mouth every 6 (six) hours as needed for muscle spasms., Disp: 30 tablet, Rfl: 0   vitamin B-12 (CYANOCOBALAMIN) 1000 MCG tablet, Take 1,000 mcg by mouth daily., Disp: , Rfl:    vitamin E 100 UNIT capsule, Take 300 Units by mouth daily. , Disp: , Rfl:    buPROPion (WELLBUTRIN XL) 150 MG 24 hr tablet, Take 150 mg by mouth 3 (three) times daily. (Patient not taking: Reported on 04/14/2023), Disp: , Rfl:    DULoxetine (CYMBALTA) 30 MG capsule, Take 30 mg by mouth 2 (two) times daily. (Patient not taking: Reported on 04/14/2023), Disp: , Rfl:    lamoTRIgine (LAMICTAL) 150 MG tablet, Take 150 mg by mouth 2 (two) times daily. (Patient not taking: Reported on 04/14/2023), Disp: , Rfl:   Orders Placed This Encounter  Procedures   CBC   CMP14+EGFR   Lipid Panel With LDL/HDL Ratio   LDL cholesterol, direct   EKG 12-Lead   PCV ECHOCARDIOGRAM COMPLETE    There are no Patient Instructions on file for this visit.   --Continue cardiac medications as reconciled in final medication list. --Return in about 3 weeks (around 05/05/2023) for Follow up after heart cath. or sooner if needed. --Continue follow-up with your primary care physician regarding the management of your other chronic comorbid conditions.  Patient's questions and concerns were addressed to her satisfaction. She voices understanding of the instructions provided during this encounter.   This note was created using a voice recognition software as a result there may be grammatical errors inadvertently enclosed that do not reflect the nature of this encounter. Every attempt is made to correct such errors.  Reyce Lubeck, DO, FACC  Pager:  [phone] Office: [phone] 

## 2023-04-15 ENCOUNTER — Ambulatory Visit: Payer: Medicare Other

## 2023-04-15 DIAGNOSIS — Z955 Presence of coronary angioplasty implant and graft: Secondary | ICD-10-CM

## 2023-04-15 DIAGNOSIS — I25118 Atherosclerotic heart disease of native coronary artery with other forms of angina pectoris: Secondary | ICD-10-CM

## 2023-04-18 ENCOUNTER — Ambulatory Visit (HOSPITAL_COMMUNITY)
Admission: RE | Admit: 2023-04-18 | Discharge: 2023-04-18 | Disposition: A | Discharge status: Home / Self Care | Payer: Medicare Other | Source: Ambulatory Visit | Attending: Surgery | Admitting: Surgery

## 2023-04-18 DIAGNOSIS — M79605 Pain in left leg: Secondary | ICD-10-CM | POA: Diagnosis not present

## 2023-04-18 DIAGNOSIS — M79604 Pain in right leg: Secondary | ICD-10-CM | POA: Insufficient documentation

## 2023-04-18 DIAGNOSIS — M9901 Segmental and somatic dysfunction of cervical region: Secondary | ICD-10-CM | POA: Diagnosis not present

## 2023-04-18 DIAGNOSIS — M5417 Radiculopathy, lumbosacral region: Secondary | ICD-10-CM | POA: Diagnosis not present

## 2023-04-18 DIAGNOSIS — M9902 Segmental and somatic dysfunction of thoracic region: Secondary | ICD-10-CM | POA: Diagnosis not present

## 2023-04-18 DIAGNOSIS — M5134 Other intervertebral disc degeneration, thoracic region: Secondary | ICD-10-CM | POA: Diagnosis not present

## 2023-04-18 DIAGNOSIS — M5032 Other cervical disc degeneration, mid-cervical region, unspecified level: Secondary | ICD-10-CM | POA: Diagnosis not present

## 2023-04-18 DIAGNOSIS — M9903 Segmental and somatic dysfunction of lumbar region: Secondary | ICD-10-CM | POA: Diagnosis not present

## 2023-04-20 DIAGNOSIS — M9901 Segmental and somatic dysfunction of cervical region: Secondary | ICD-10-CM | POA: Diagnosis not present

## 2023-04-20 DIAGNOSIS — M67911 Unspecified disorder of synovium and tendon, right shoulder: Secondary | ICD-10-CM | POA: Diagnosis not present

## 2023-04-20 DIAGNOSIS — M542 Cervicalgia: Secondary | ICD-10-CM | POA: Diagnosis not present

## 2023-04-20 DIAGNOSIS — M9902 Segmental and somatic dysfunction of thoracic region: Secondary | ICD-10-CM | POA: Diagnosis not present

## 2023-04-20 DIAGNOSIS — M5032 Other cervical disc degeneration, mid-cervical region, unspecified level: Secondary | ICD-10-CM | POA: Diagnosis not present

## 2023-04-20 DIAGNOSIS — M5134 Other intervertebral disc degeneration, thoracic region: Secondary | ICD-10-CM | POA: Diagnosis not present

## 2023-04-20 DIAGNOSIS — M24811 Other specific joint derangements of right shoulder, not elsewhere classified: Secondary | ICD-10-CM | POA: Diagnosis not present

## 2023-04-20 DIAGNOSIS — M9903 Segmental and somatic dysfunction of lumbar region: Secondary | ICD-10-CM | POA: Diagnosis not present

## 2023-04-20 DIAGNOSIS — S46811A Strain of other muscles, fascia and tendons at shoulder and upper arm level, right arm, initial encounter: Secondary | ICD-10-CM | POA: Diagnosis not present

## 2023-04-20 DIAGNOSIS — M5417 Radiculopathy, lumbosacral region: Secondary | ICD-10-CM | POA: Diagnosis not present

## 2023-04-25 DIAGNOSIS — M5417 Radiculopathy, lumbosacral region: Secondary | ICD-10-CM | POA: Diagnosis not present

## 2023-04-25 DIAGNOSIS — M9902 Segmental and somatic dysfunction of thoracic region: Secondary | ICD-10-CM | POA: Diagnosis not present

## 2023-04-25 DIAGNOSIS — M9903 Segmental and somatic dysfunction of lumbar region: Secondary | ICD-10-CM | POA: Diagnosis not present

## 2023-04-25 DIAGNOSIS — M5032 Other cervical disc degeneration, mid-cervical region, unspecified level: Secondary | ICD-10-CM | POA: Diagnosis not present

## 2023-04-25 DIAGNOSIS — M5134 Other intervertebral disc degeneration, thoracic region: Secondary | ICD-10-CM | POA: Diagnosis not present

## 2023-04-25 DIAGNOSIS — M9901 Segmental and somatic dysfunction of cervical region: Secondary | ICD-10-CM | POA: Diagnosis not present

## 2023-04-25 NOTE — Progress Notes (Unsigned)
[facility] [place]  ASSESSMENT / PLAN: Jo Allen is a 57 y.o. female with chronic venous insufficiency of bilateral lower extremity causing swelling (C3 disease).  Venous duplex is significant for saphenofemoral reflux, but small greater saphenous vein. Not suitable for ablation. I suspect her lower extremity discomfort is from radiculopathy or spinal stenosis.  Recommend compression and elevation for symptomatic relief. Follow up with me as needed.  CHIEF COMPLAINT: bilateral leg discomfort  HISTORY OF PRESENT ILLNESS: Jo Allen is a 57 y.o. female presents clinic for evaluation of bilateral radiating discomfort from the hips down to the calves associated with swelling and paresthesias.  Patient notes her discomfort is most notable when seated on the toilet or similarly hard surface chair.  The pain radiates into her calves and causes cramping discomfort and paresthesias.  She also notes some swelling in her ankles and feet.  She has been previously diagnosed with venous varicosities.  Past Medical History:  Diagnosis Date   Asthma    CAD (coronary artery disease)    GERD (gastroesophageal reflux disease)    Headache    Heart disease    Hypertension    Obesity    Paresthesia 08/20/2015   Prediabetes    Transfusion history    8yrs ago    Past Surgical History:  Procedure Laterality Date   ANGIOPLASTY     BREAST BIOPSY Left    BREAST BIOPSY WITH SENTINEL LYMPH NODE BIOPSY AND NEEDLE LOCALIZATION Left    COLONOSCOPY WITH PROPOFOL N/A 02/19/2014   Procedure: COLONOSCOPY WITH PROPOFOL;  Surgeon: Charolett Bumpers, MD;  Location: [facility];  Service: Endoscopy;  Laterality: N/A;   JOINT REPLACEMENT Bilateral    TOTAL KNEE ARTHROPLASTY Bilateral     Family History  Problem Relation Age of Onset   Hypertension Mother    Diabetes Mother    Stroke Father    Headache Sister    Depression Brother    Breast cancer Neg Hx     Social History    Socioeconomic History   Marital status: Single    Spouse name: Not on file   Number of children: Not on file   Years of education: Not on file   Highest education level: Not on file  Occupational History   Not on file  Tobacco Use   Smoking status: Never   Smokeless tobacco: Never  Vaping Use   Vaping Use: Never used  Substance and Sexual Activity   Alcohol use: No   Drug use: No   Sexual activity: Not Currently  Other Topics Concern   Not on file  Social History Narrative   Not on file   Social Determinants of Health   Financial Resource Strain: Not on file  Food Insecurity: Food Insecurity Present (03/30/2023)   Hunger Vital Sign    Worried About Running Out of Food in the Last Year: Sometimes true    Ran Out of Food in the Last Year: Never true  Transportation Needs: Not on file  Physical Activity: Not on file  Stress: Not on file  Social Connections: Not on file  Intimate Partner Violence: Not on file    Allergies  Allergen Reactions   Morphine And Codeine Nausea And Vomiting, Hives and Itching   Trazodone Nausea Only    Other Reaction(s): dizziness, headache   Penicillins Hives    Has patient had a PCN reaction causing immediate rash, facial/tongue/throat swelling, SOB or lightheadedness with hypotension: Yes  Has patient had a PCN  reaction causing severe rash involving mucus membranes or skin necrosis: Yes  Has patient had a PCN reaction that required hospitalization No  Has patient had a PCN reaction occurring within the last 10 years: No  If all of the above answers are "NO", then may proceed with Cephalosporin use.  Has patient had a PCN reaction causing immediate rash, facial/tongue/throat swelling, SOB or lightheadedness with hypotension: Yes  Has patient had a PCN reaction causing severe rash involving mucus membranes or skin necrosis: Yes  Has patient had a PCN reaction that required hospitalization No  Has patient had a PCN reaction occurring  within the last 10 years: No  If all of the above answers are "NO", then may proceed with Cephalosporin use.   Tramadol     Upset stomach  Other Reaction(s): Other   Adrenal Cortex Extract     Other Reaction(s): Not available    Current Outpatient Medications  Medication Sig Dispense Refill   acetaminophen (TYLENOL 8 HOUR) 650 MG CR tablet Take 1 tablet (650 mg total) by mouth every 8 (eight) hours as needed. 30 tablet 0   albuterol (VENTOLIN HFA) 108 (90 Base) MCG/ACT inhaler Inhale 1-2 puffs into the lungs every 6 (six) hours as needed for wheezing or shortness of breath. 1 each 0   amLODipine (NORVASC) 5 MG tablet Take 5 mg by mouth daily.     aspirin EC 81 MG tablet Take 1 tablet (81 mg total) by mouth daily. Swallow whole. 90 tablet 0   Biotin 1000 MCG CHEW Chew 5,000 mcg by mouth daily.     buPROPion (WELLBUTRIN XL) 150 MG 24 hr tablet Take 150 mg by mouth 3 (three) times daily. (Patient not taking: Reported on 04/14/2023)     Cholecalciferol (VITAMIN D3) 25 MCG (1000 UT) CAPS Take 2,000 Units by mouth daily.     CRANBERRY PO Take 1 tablet by mouth daily.     doxepin (SINEQUAN) 25 MG capsule Take 25-50 mg by mouth at bedtime as needed (sleep aid).      DULoxetine (CYMBALTA) 30 MG capsule Take 30 mg by mouth 2 (two) times daily. (Patient not taking: Reported on 04/14/2023)     ferrous sulfate 325 (65 FE) MG EC tablet Take 325 mg by mouth daily with breakfast.     Ginkgo 60 MG TABS Take 60 mg by mouth daily.     ibuprofen (ADVIL) 600 MG tablet Take 1 tablet (600 mg total) by mouth every 6 (six) hours as needed. 30 tablet 0   Lactobacillus (PROBIOTIC ACIDOPHILUS PO) Take 1 tablet by mouth daily.     lamoTRIgine (LAMICTAL) 150 MG tablet Take 150 mg by mouth 2 (two) times daily. (Patient not taking: Reported on 04/14/2023)     metoprolol succinate (TOPROL XL) 25 MG 24 hr tablet Take 1 tablet (25 mg total) by mouth daily. 90 tablet 0   Multiple Vitamins-Minerals (ONE-A-DAY WOMENS PO) Take 1  tablet by mouth daily.     nitroGLYCERIN (NITROSTAT) 0.4 MG SL tablet Place 1 tablet (0.4 mg total) under the tongue every 5 (five) minutes as needed for chest pain. If you require more than two tablets five minutes apart go to the nearest ER via EMS. 30 tablet 0   ondansetron (ZOFRAN-ODT) 8 MG disintegrating tablet Take 1 tablet (8 mg total) by mouth every 8 (eight) hours as needed for nausea or vomiting. 12 tablet 0   pantoprazole (PROTONIX) 20 MG tablet Take 1 tablet (20 mg total) by mouth daily.  14 tablet 0   pregabalin (LYRICA) 50 MG capsule Take 1 capsule (50 mg total) by mouth 3 (three) times daily. 60 capsule 5   rosuvastatin (CRESTOR) 20 MG tablet Take 20 mg by mouth daily.     tiZANidine (ZANAFLEX) 2 MG tablet Take 1-2 tablets (2-4 mg total) by mouth every 6 (six) hours as needed for muscle spasms. 30 tablet 0   vitamin B-12 (CYANOCOBALAMIN) 1000 MCG tablet Take 1,000 mcg by mouth daily.     vitamin E 100 UNIT capsule Take 300 Units by mouth daily.      No current facility-administered medications for this visit.    PHYSICAL EXAM There were no vitals filed for this visit.  Middle-aged no acute distress Regular rate and rhythm Unlabored breathing Mild edema about the feet and ankle bilaterally 2+ dorsalis pedis pulses  PERTINENT LABORATORY AND RADIOLOGIC DATA  Most recent CBC    Latest Ref Rng & Units 03/24/2023    2:15 PM 06/30/2021   11:48 PM 05/24/2021   10:51 AM  CBC  WBC 4.0 - 10.5 K/uL 6.2  7.5  4.9   Hemoglobin 12.0 - 15.0 g/dL 16.1  09.6  04.5   Hematocrit 36.0 - 46.0 % 42.6  43.0  41.2   Platelets 150 - 400 K/uL 260  275  227      Most recent CMP    Latest Ref Rng & Units 03/24/2023    2:15 PM 06/30/2021   11:48 PM 05/24/2021   10:51 AM  CMP  Glucose 70 - 99 mg/dL 96  409  811   BUN 6 - 20 mg/dL 7  13  10    Creatinine 0.44 - 1.00 mg/dL 9.14  7.82  9.56   Sodium 135 - 145 mmol/L 142  137  140   Potassium 3.5 - 5.1 mmol/L 3.8  3.6  3.9   Chloride 98 - 111 mmol/L  109  102  104   CO2 22 - 32 mmol/L 28  29  28    Calcium 8.9 - 10.3 mg/dL 9.1  9.4  9.7   Total Protein 6.5 - 8.1 g/dL 5.8  7.6  7.0   Total Bilirubin 0.3 - 1.2 mg/dL 0.5  0.4  0.4   Alkaline Phos 38 - 126 U/L 83  105  92   AST 15 - 41 U/L 22  22  19    ALT 0 - 44 U/L 20  18  17      Right:  - No evidence of deep vein thrombosis seen in the right lower extremity,  from the common femoral through the popliteal veins.  - No evidence of superficial venous thrombosis in the right lower  extremity.    - Deep vein reflux in the CFV.   - Superficial vein reflux in the SFJ and GSV at the proximal calf.   Rande Brunt. Lenell Antu, MD FACS [facility] Phone Number: [phone] 04/25/2023 8:54 AM   Total time spent on preparing this encounter including chart review, data review, collecting history, examining the patient, coordinating care for this new patient, 45 minutes.  Portions of this report may have been transcribed using voice recognition software.  Every effort has been made to ensure accuracy; however, inadvertent computerized transcription errors may still be present.

## 2023-04-26 ENCOUNTER — Ambulatory Visit (INDEPENDENT_AMBULATORY_CARE_PROVIDER_SITE_OTHER): Payer: Medicare Other | Admitting: Vascular Surgery

## 2023-04-26 ENCOUNTER — Encounter: Payer: Self-pay | Admitting: Vascular Surgery

## 2023-04-26 VITALS — BP 126/82 | HR 77 | Temp 98.3°F | Resp 20 | Ht 64.0 in | Wt 238.0 lb

## 2023-04-26 DIAGNOSIS — I25118 Atherosclerotic heart disease of native coronary artery with other forms of angina pectoris: Secondary | ICD-10-CM | POA: Diagnosis not present

## 2023-04-26 DIAGNOSIS — I872 Venous insufficiency (chronic) (peripheral): Secondary | ICD-10-CM

## 2023-04-26 DIAGNOSIS — F410 Panic disorder [episodic paroxysmal anxiety] without agoraphobia: Secondary | ICD-10-CM | POA: Diagnosis not present

## 2023-04-26 DIAGNOSIS — F319 Bipolar disorder, unspecified: Secondary | ICD-10-CM | POA: Diagnosis not present

## 2023-04-27 DIAGNOSIS — M542 Cervicalgia: Secondary | ICD-10-CM | POA: Diagnosis not present

## 2023-04-27 LAB — CMP14+EGFR
ALT: 19 IU/L (ref 0–32)
AST: 14 IU/L (ref 0–40)
Albumin: 3.6 g/dL — ABNORMAL LOW (ref 3.8–4.9)
Alkaline Phosphatase: 105 IU/L (ref 44–121)
BUN/Creatinine Ratio: 14 (ref 9–23)
BUN: 11 mg/dL (ref 6–24)
Bilirubin Total: <0.2 mg/dL (ref 0.0–1.2)
CO2: 26 mmol/L (ref 20–29)
Calcium: 9.2 mg/dL (ref 8.7–10.2)
Chloride: 105 mmol/L (ref 96–106)
Creatinine, Ser: 0.77 mg/dL (ref 0.57–1.00)
Globulin, Total: 1.8 g/dL (ref 1.5–4.5)
Glucose: 93 mg/dL (ref 70–99)
Potassium: 4.7 mmol/L (ref 3.5–5.2)
Sodium: 142 mmol/L (ref 134–144)
Total Protein: 5.4 g/dL — ABNORMAL LOW (ref 6.0–8.5)
eGFR: 89 mL/min/{1.73_m2} (ref >59)

## 2023-04-27 LAB — CBC
Hematocrit: 41.4 % (ref 34.0–46.6)
Hemoglobin: 13.2 g/dL (ref 11.1–15.9)
MCH: 27 pg (ref 26.6–33.0)
MCHC: 31.9 g/dL (ref 31.5–35.7)
MCV: 85 fL (ref 79–97)
Platelets: 274 10*3/uL (ref 150–450)
RBC: 4.89 x10E6/uL (ref 3.77–5.28)
RDW: 11.7 % (ref 11.7–15.4)
WBC: 7.3 10*3/uL (ref 3.4–10.8)

## 2023-04-27 LAB — LIPID PANEL WITH LDL/HDL RATIO
Cholesterol, Total: 171 mg/dL (ref 100–199)
HDL: 47 mg/dL (ref >39)
LDL Chol Calc (NIH): 108 mg/dL — ABNORMAL HIGH (ref 0–99)
LDL/HDL Ratio: 2.3 ratio (ref 0.0–3.2)
Triglycerides: 85 mg/dL (ref 0–149)
VLDL Cholesterol Cal: 16 mg/dL (ref 5–40)

## 2023-04-27 LAB — LDL CHOLESTEROL, DIRECT: LDL Direct: 113 mg/dL — ABNORMAL HIGH (ref 0–99)

## 2023-04-28 DIAGNOSIS — M5417 Radiculopathy, lumbosacral region: Secondary | ICD-10-CM | POA: Diagnosis not present

## 2023-04-28 DIAGNOSIS — M9903 Segmental and somatic dysfunction of lumbar region: Secondary | ICD-10-CM | POA: Diagnosis not present

## 2023-04-28 DIAGNOSIS — M9901 Segmental and somatic dysfunction of cervical region: Secondary | ICD-10-CM | POA: Diagnosis not present

## 2023-04-28 DIAGNOSIS — M9902 Segmental and somatic dysfunction of thoracic region: Secondary | ICD-10-CM | POA: Diagnosis not present

## 2023-04-28 DIAGNOSIS — M5134 Other intervertebral disc degeneration, thoracic region: Secondary | ICD-10-CM | POA: Diagnosis not present

## 2023-04-28 DIAGNOSIS — M5032 Other cervical disc degeneration, mid-cervical region, unspecified level: Secondary | ICD-10-CM | POA: Diagnosis not present

## 2023-04-29 ENCOUNTER — Other Ambulatory Visit: Payer: Self-pay

## 2023-04-29 ENCOUNTER — Observation Stay (HOSPITAL_COMMUNITY)
Admission: RE | Admit: 2023-04-29 | Discharge: 2023-04-30 | Disposition: A | Discharge status: Home / Self Care | Payer: Medicare Other | Attending: Cardiology | Admitting: Cardiology

## 2023-04-29 ENCOUNTER — Encounter (HOSPITAL_COMMUNITY)
Admission: RE | Disposition: A | Discharge status: Home / Self Care | Payer: Self-pay | Source: Home / Self Care | Attending: Cardiology

## 2023-04-29 ENCOUNTER — Encounter (HOSPITAL_COMMUNITY): Payer: Self-pay | Admitting: Cardiology

## 2023-04-29 DIAGNOSIS — I2510 Atherosclerotic heart disease of native coronary artery without angina pectoris: Principal | ICD-10-CM | POA: Insufficient documentation

## 2023-04-29 DIAGNOSIS — Z955 Presence of coronary angioplasty implant and graft: Secondary | ICD-10-CM | POA: Diagnosis not present

## 2023-04-29 DIAGNOSIS — Z7982 Long term (current) use of aspirin: Secondary | ICD-10-CM | POA: Diagnosis not present

## 2023-04-29 DIAGNOSIS — I110 Hypertensive heart disease with heart failure: Secondary | ICD-10-CM | POA: Insufficient documentation

## 2023-04-29 DIAGNOSIS — S5011XA Contusion of right forearm, initial encounter: Secondary | ICD-10-CM | POA: Diagnosis not present

## 2023-04-29 DIAGNOSIS — J45909 Unspecified asthma, uncomplicated: Secondary | ICD-10-CM | POA: Diagnosis not present

## 2023-04-29 DIAGNOSIS — I25118 Atherosclerotic heart disease of native coronary artery with other forms of angina pectoris: Secondary | ICD-10-CM

## 2023-04-29 DIAGNOSIS — Z6841 Body Mass Index (BMI) 40.0 and over, adult: Secondary | ICD-10-CM | POA: Insufficient documentation

## 2023-04-29 DIAGNOSIS — I5032 Chronic diastolic (congestive) heart failure: Secondary | ICD-10-CM | POA: Insufficient documentation

## 2023-04-29 DIAGNOSIS — Z96653 Presence of artificial knee joint, bilateral: Secondary | ICD-10-CM | POA: Insufficient documentation

## 2023-04-29 DIAGNOSIS — Z79899 Other long term (current) drug therapy: Secondary | ICD-10-CM | POA: Insufficient documentation

## 2023-04-29 DIAGNOSIS — I209 Angina pectoris, unspecified: Secondary | ICD-10-CM

## 2023-04-29 HISTORY — PX: LEFT HEART CATH AND CORONARY ANGIOGRAPHY: CATH118249

## 2023-04-29 SURGERY — LEFT HEART CATH AND CORONARY ANGIOGRAPHY
Anesthesia: LOCAL

## 2023-04-29 MED ORDER — SODIUM CHLORIDE 0.9 % WEIGHT BASED INFUSION: 1.0000 mL/kg/h | INTRAVENOUS | Status: DC

## 2023-04-29 MED ORDER — SODIUM CHLORIDE 0.9 % IV SOLN: 250.0000 mL | INTRAVENOUS | Status: DC | PRN

## 2023-04-29 MED ORDER — DOXEPIN HCL 25 MG PO CAPS
25.0000 mg | ORAL_CAPSULE | Freq: Every evening | ORAL | Status: DC | PRN
  Administered 2023-04-29: 25 mg via ORAL
  Filled 2023-04-29 (×2): qty 1

## 2023-04-29 MED ORDER — BIOTIN 1000 MCG PO CHEW: 5000.0000 ug | CHEWABLE_TABLET | Freq: Every day | ORAL | Status: DC

## 2023-04-29 MED ORDER — PANTOPRAZOLE SODIUM 20 MG PO TBEC: 20.0000 mg | DELAYED_RELEASE_TABLET | Freq: Every day | ORAL | Status: DC

## 2023-04-29 MED ORDER — LABETALOL HCL 5 MG/ML IV SOLN
10.0000 mg | Freq: Once | INTRAVENOUS | Status: AC
  Administered 2023-04-29: 10 mg via INTRAVENOUS

## 2023-04-29 MED ORDER — SODIUM CHLORIDE 0.9% FLUSH: 3.0000 mL | INTRAVENOUS | Status: DC | PRN

## 2023-04-29 MED ORDER — SODIUM CHLORIDE 0.9 % WEIGHT BASED INFUSION
1.0000 mL/kg/h | INTRAVENOUS | Status: DC
Start: 2023-04-30 — End: 2023-04-29

## 2023-04-29 MED ORDER — AMLODIPINE BESYLATE 10 MG PO TABS: 10.0000 mg | ORAL_TABLET | Freq: Every day | ORAL | 1 refills | Status: AC

## 2023-04-29 MED ORDER — MELATONIN 3 MG PO TABS: 3.0000 mg | ORAL_TABLET | Freq: Every day | ORAL | Status: DC

## 2023-04-29 MED ORDER — IOHEXOL 350 MG/ML SOLN
INTRAVENOUS | Status: DC | PRN
  Administered 2023-04-29: 50 mL

## 2023-04-29 MED ORDER — VERAPAMIL HCL 2.5 MG/ML IV SOLN
INTRAVENOUS | Status: DC | PRN
  Administered 2023-04-29: 10 mL via INTRA_ARTERIAL

## 2023-04-29 MED ORDER — MIDAZOLAM HCL 2 MG/2ML IJ SOLN
INTRAMUSCULAR | Status: AC
  Filled 2023-04-29: qty 2

## 2023-04-29 MED ORDER — FERROUS SULFATE 325 (65 FE) MG PO TBEC: 325.0000 mg | DELAYED_RELEASE_TABLET | ORAL | Status: DC

## 2023-04-29 MED ORDER — AMLODIPINE BESYLATE 10 MG PO TABS
10.0000 mg | ORAL_TABLET | Freq: Every day | ORAL | Status: DC
  Administered 2023-04-29 – 2023-04-30 (×2): 10 mg via ORAL
  Filled 2023-04-29 (×2): qty 1

## 2023-04-29 MED ORDER — ONDANSETRON HCL 4 MG/2ML IJ SOLN
4.0000 mg | Freq: Four times a day (QID) | INTRAMUSCULAR | Status: DC | PRN
  Administered 2023-04-29: 4 mg via INTRAVENOUS
  Filled 2023-04-29: qty 2

## 2023-04-29 MED ORDER — VERAPAMIL HCL 2.5 MG/ML IV SOLN
INTRAVENOUS | Status: AC
  Filled 2023-04-29: qty 2

## 2023-04-29 MED ORDER — PREGABALIN 50 MG PO CAPS: 50.0000 mg | ORAL_CAPSULE | Freq: Three times a day (TID) | ORAL | Status: DC

## 2023-04-29 MED ORDER — DOXEPIN HCL 25 MG PO CAPS: 25.0000 mg | ORAL_CAPSULE | Freq: Every evening | ORAL | Status: DC | PRN

## 2023-04-29 MED ORDER — METOPROLOL SUCCINATE ER 50 MG PO TB24
50.0000 mg | ORAL_TABLET | Freq: Every day | ORAL | 1 refills | Status: DC
Start: 2023-04-29 — End: 2024-08-26

## 2023-04-29 MED ORDER — VITAMIN B-12 1000 MCG PO TABS
1000.0000 ug | ORAL_TABLET | Freq: Every day | ORAL | Status: DC
  Administered 2023-04-29 – 2023-04-30 (×2): 1000 ug via ORAL
  Filled 2023-04-29 (×2): qty 1

## 2023-04-29 MED ORDER — SODIUM CHLORIDE 0.9% FLUSH
3.0000 mL | Freq: Two times a day (BID) | INTRAVENOUS | Status: DC
  Administered 2023-04-29: 3 mL via INTRAVENOUS

## 2023-04-29 MED ORDER — LIDOCAINE HCL (PF) 1 % IJ SOLN
INTRAMUSCULAR | Status: DC | PRN
  Administered 2023-04-29 (×2): 2 mL via INTRADERMAL

## 2023-04-29 MED ORDER — LORATADINE 10 MG PO TABS
10.0000 mg | ORAL_TABLET | Freq: Every day | ORAL | Status: DC
  Administered 2023-04-29 – 2023-04-30 (×2): 10 mg via ORAL
  Filled 2023-04-29 (×2): qty 1

## 2023-04-29 MED ORDER — FENTANYL CITRATE (PF) 100 MCG/2ML IJ SOLN
INTRAMUSCULAR | Status: AC
  Filled 2023-04-29: qty 2

## 2023-04-29 MED ORDER — HEPARIN (PORCINE) IN NACL 1000-0.9 UT/500ML-% IV SOLN
INTRAVENOUS | Status: DC | PRN
  Administered 2023-04-29 (×2): 500 mL

## 2023-04-29 MED ORDER — LIDOCAINE HCL (PF) 1 % IJ SOLN
INTRAMUSCULAR | Status: AC
  Filled 2023-04-29: qty 30

## 2023-04-29 MED ORDER — MIDAZOLAM HCL 2 MG/2ML IJ SOLN
INTRAMUSCULAR | Status: DC | PRN
  Administered 2023-04-29: 2 mg via INTRAVENOUS

## 2023-04-29 MED ORDER — ONDANSETRON 4 MG PO TBDP: 8.0000 mg | ORAL_TABLET | Freq: Three times a day (TID) | ORAL | Status: DC | PRN

## 2023-04-29 MED ORDER — ASPIRIN 81 MG PO TBEC: 81.0000 mg | DELAYED_RELEASE_TABLET | Freq: Every day | ORAL | Status: DC

## 2023-04-29 MED ORDER — ALBUTEROL SULFATE (2.5 MG/3ML) 0.083% IN NEBU: 2.5000 mg | INHALATION_SOLUTION | Freq: Four times a day (QID) | RESPIRATORY_TRACT | Status: DC | PRN

## 2023-04-29 MED ORDER — HEPARIN SODIUM (PORCINE) 1000 UNIT/ML IJ SOLN
INTRAMUSCULAR | Status: DC | PRN
  Administered 2023-04-29: 5000 [IU] via INTRAVENOUS

## 2023-04-29 MED ORDER — ACETAMINOPHEN ER 650 MG PO TBCR: 650.0000 mg | EXTENDED_RELEASE_TABLET | Freq: Three times a day (TID) | ORAL | Status: DC | PRN

## 2023-04-29 MED ORDER — HYDRALAZINE HCL 20 MG/ML IJ SOLN
10.0000 mg | INTRAMUSCULAR | Status: DC | PRN
  Administered 2023-04-29: 10 mg via INTRAVENOUS

## 2023-04-29 MED ORDER — GINKGO 60 MG PO TABS: 60.0000 mg | ORAL_TABLET | Freq: Every day | ORAL | Status: DC

## 2023-04-29 MED ORDER — ASPIRIN 81 MG PO CHEW
81.0000 mg | CHEWABLE_TABLET | ORAL | Status: AC
  Administered 2023-04-29: 81 mg via ORAL
  Filled 2023-04-29: qty 1

## 2023-04-29 MED ORDER — ROSUVASTATIN CALCIUM 20 MG PO TABS
20.0000 mg | ORAL_TABLET | Freq: Every day | ORAL | Status: DC
  Administered 2023-04-29 – 2023-04-30 (×2): 20 mg via ORAL
  Filled 2023-04-29 (×2): qty 1

## 2023-04-29 MED ORDER — SODIUM CHLORIDE 0.9 % WEIGHT BASED INFUSION: 3.0000 mL/kg/h | INTRAVENOUS | Status: DC

## 2023-04-29 MED ORDER — SODIUM CHLORIDE 0.9 % WEIGHT BASED INFUSION
3.0000 mL/kg/h | INTRAVENOUS | Status: DC
  Administered 2023-04-29: 3 mL/kg/h via INTRAVENOUS

## 2023-04-29 MED ORDER — ACETAMINOPHEN 325 MG PO TABS
650.0000 mg | ORAL_TABLET | ORAL | Status: DC | PRN
  Administered 2023-04-29 – 2023-04-30 (×3): 650 mg via ORAL
  Filled 2023-04-29 (×3): qty 2

## 2023-04-29 MED ORDER — HYDRALAZINE HCL 20 MG/ML IJ SOLN
INTRAMUSCULAR | Status: AC
  Filled 2023-04-29: qty 1

## 2023-04-29 MED ORDER — VITAMIN E 180 MG (400 UNIT) PO CAPS: 400.0000 [IU] | ORAL_CAPSULE | Freq: Every day | ORAL | Status: DC

## 2023-04-29 MED ORDER — LABETALOL HCL 5 MG/ML IV SOLN
INTRAVENOUS | Status: AC
  Filled 2023-04-29: qty 4

## 2023-04-29 MED ORDER — SODIUM CHLORIDE 0.9% FLUSH: 3.0000 mL | Freq: Two times a day (BID) | INTRAVENOUS | Status: DC

## 2023-04-29 MED ORDER — TIZANIDINE HCL 2 MG PO TABS: 2.0000 mg | ORAL_TABLET | Freq: Four times a day (QID) | ORAL | Status: DC | PRN

## 2023-04-29 MED ORDER — NITROGLYCERIN 0.4 MG SL SUBL: 0.4000 mg | SUBLINGUAL_TABLET | SUBLINGUAL | Status: DC | PRN

## 2023-04-29 MED ORDER — SODIUM CHLORIDE 0.9 % WEIGHT BASED INFUSION: 1.0000 mL/kg/h | INTRAVENOUS | Status: AC

## 2023-04-29 MED ORDER — FENTANYL CITRATE (PF) 100 MCG/2ML IJ SOLN
INTRAMUSCULAR | Status: DC | PRN
  Administered 2023-04-29: 25 ug via INTRAVENOUS

## 2023-04-29 MED ORDER — HEPARIN SODIUM (PORCINE) 1000 UNIT/ML IJ SOLN
INTRAMUSCULAR | Status: AC
  Filled 2023-04-29: qty 10

## 2023-04-29 SURGICAL SUPPLY — 10 items
CATH INFINITI AMBI 5FR TG (CATHETERS) IMPLANT
DEVICE RAD COMP TR BAND LRG (VASCULAR PRODUCTS) IMPLANT
GLIDESHEATH SLEND A-KIT 6F 22G (SHEATH) IMPLANT
GUIDEWIRE INQWIRE 1.5J.035X260 (WIRE) IMPLANT
INQWIRE 1.5J .035X260CM (WIRE) ×1
KIT HEART LEFT (KITS) ×1 IMPLANT
PACK CARDIAC CATHETERIZATION (CUSTOM PROCEDURE TRAY) ×1 IMPLANT
SHEATH PROBE COVER 6X72 (BAG) IMPLANT
TRANSDUCER W/STOPCOCK (MISCELLANEOUS) ×1 IMPLANT
TUBING CIL FLEX 10 FLL-RA (TUBING) ×1 IMPLANT

## 2023-04-29 NOTE — Plan of Care (Signed)

## 2023-04-29 NOTE — Progress Notes (Addendum)
1000- Bp protocol started / per order for forearm hematoma up to elbow.(Ulnar side) 1020- cath lab here to assist with band placement and BP protocol.  Dr. Jacinto Halim was paged by charge nurse for orders.  New orders given and iv Bp meds given. Charge Nurse Page inquired about getting a forearm Ultrasound and was denied.  Patient also became diaphoretic,nauseated  and warm - was given zofran IV 4 mg with relief.  Dr. Jacinto Halim stated he would assess  patient at bedside.  Will continue to monitor patient . Cath Lab staff is still here with patient.

## 2023-04-29 NOTE — Progress Notes (Signed)
   04/29/23 1000  First Vascular Site Assessment  #1 - Location of Site Assessment Right ulnar  #1 - Vascular Site Assessment Scale  (BP Cuff protocol right forarm)  #1 - Air in Band 8 cc  #1 - Hematoma present? Yes (BP cuff protocol right forarm)

## 2023-04-29 NOTE — Progress Notes (Signed)
1250- BP cuff removed, right forearm still has 2 areas that remain taught and painful.  Cathlab nurse to come and assess.

## 2023-04-29 NOTE — Progress Notes (Addendum)
Report was called to [facility] nurse on [address] [place]. Right arm remains stable  and soft, CDI. Patient transferred to   [address] [place] via wheelchair Nurse and  NT.

## 2023-04-29 NOTE — Progress Notes (Signed)
1120- Dr. Jacinto Halim here to assess patient. Per Dr. Jacinto Halim we should leave cuff inflated at 60x 1 hour and slowly release every 5 min. Per Dr. Jacinto Halim we will leave TRB at 10 and not remove any air for now.

## 2023-04-29 NOTE — Discharge Instructions (Signed)

## 2023-04-29 NOTE — Interval H&P Note (Signed)
History and Physical Interval Note:  04/29/2023 7:51 AM  Jo Allen  has presented today for surgery, with the diagnosis of cad.  The various methods of treatment have been discussed with the patient and family. After consideration of risks, benefits and other options for treatment, the patient has consented to  Procedure(s): LEFT HEART CATH AND CORONARY ANGIOGRAPHY (N/A) and possible coronary angioplasty as a surgical intervention.  The patient's history has been reviewed, patient examined, no change in status, stable for surgery.  I have reviewed the patient's chart and labs.  Questions were answered to the patient's satisfaction.    Cath Lab Visit (complete for each Cath Lab visit)  Clinical Evaluation Leading to the Procedure:   ACS: No.  Non-ACS:    Anginal Classification: CCS III  Anti-ischemic medical therapy: Minimal Therapy (1 class of medications)  Non-Invasive Test Results: No non-invasive testing performed  Prior CABG: No previous CABG   Jo Allen

## 2023-04-30 ENCOUNTER — Encounter (HOSPITAL_COMMUNITY): Payer: Self-pay | Admitting: Cardiology

## 2023-04-30 DIAGNOSIS — I110 Hypertensive heart disease with heart failure: Secondary | ICD-10-CM | POA: Diagnosis not present

## 2023-04-30 DIAGNOSIS — J45909 Unspecified asthma, uncomplicated: Secondary | ICD-10-CM | POA: Diagnosis not present

## 2023-04-30 DIAGNOSIS — I2510 Atherosclerotic heart disease of native coronary artery without angina pectoris: Secondary | ICD-10-CM | POA: Diagnosis not present

## 2023-04-30 DIAGNOSIS — Z79899 Other long term (current) drug therapy: Secondary | ICD-10-CM | POA: Diagnosis not present

## 2023-04-30 DIAGNOSIS — Z7982 Long term (current) use of aspirin: Secondary | ICD-10-CM | POA: Diagnosis not present

## 2023-04-30 DIAGNOSIS — Z96653 Presence of artificial knee joint, bilateral: Secondary | ICD-10-CM | POA: Diagnosis not present

## 2023-04-30 DIAGNOSIS — I5032 Chronic diastolic (congestive) heart failure: Secondary | ICD-10-CM | POA: Diagnosis not present

## 2023-04-30 DIAGNOSIS — S5011XA Contusion of right forearm, initial encounter: Secondary | ICD-10-CM | POA: Diagnosis not present

## 2023-04-30 NOTE — TOC Transition Note (Signed)
Transition of Care [facility]) - CM/SW Discharge Note   Patient Details  Name: Jo Allen MRN: [id number] Date of Birth: 05-14-1966  Transition of Care [facility]) CM/SW Contact:  Ronny Bacon, RN Phone Number: 04/30/2023, 9:09 AM   Clinical Narrative:  Patient being discharged home. Patient reports has medicare and supplemental BC/BS coverage. Patient also reports has family that can assist with getting her prescriptions.     Final next level of care: Home/Self Care Barriers to Discharge: No Barriers Identified   Patient Goals and CMS Choice      Discharge Placement                         Discharge Plan and Services Additional resources added to the After Visit Summary for                                       Social Determinants of Health (SDOH) Interventions SDOH Screenings   Food Insecurity: No Food Insecurity (04/29/2023)  Recent Concern: Food Insecurity - Food Insecurity Present (03/30/2023)  Housing: Low Risk  (04/29/2023)  Transportation Needs: No Transportation Needs (04/29/2023)  Utilities: Not At Risk (04/29/2023)  Financial Resource Strain: Low Risk  (01/03/2023)   Received from [facility]  Physical Activity: Unknown (11/02/2022)   Received from [facility]  Social Connections: Socially Integrated (11/02/2022)   Received from [facility]  Stress: Stress Concern Present (11/02/2022)   Received from [facility]  Tobacco Use: Low Risk  (04/30/2023)     Readmission Risk Interventions     No data to display

## 2023-04-30 NOTE — Care Management Obs Status (Signed)
MEDICARE OBSERVATION STATUS NOTIFICATION   Patient Details  Name: Jo Allen MRN: [id number] Date of Birth: Aug 23, 1966   Medicare Observation Status Notification Given:  Yes    Ronny Bacon, RN 04/30/2023, 9:05 AM

## 2023-04-30 NOTE — Discharge Summary (Signed)
Physician Discharge Summary  Patient ID: Jo Allen MRN: [id number] DOB/AGE: 59/18/1967 59 y.o. [place], [name]   Admit date: 04/29/2023 Discharge date: 04/30/2023  Primary Discharge Diagnosis Dyspnea on exertion secondary to chronic diastolic heart failure and hypertensive heart disease Chest pain noncardiac Primary hypertension Morbid obesity   Significant Diagnostic Studies:  Left Heart Catheterization 04/29/23:  LV 131/12, EDP 19 mmHg.  Ao 161/83, mean [place] 15 mmHg.  No pressure gradient across the aortic valve.  LVEF 55 to 60%. RCA: Dominant, smooth and normal. LM: Large-caliber vessel.  Smooth and normal. LCx: Very large caliber vessel, gives origin to a small AV groove branch and continues distally as to large distal circumflex gives origin to large OM1 and OM 2.  Smooth and normal.  Mildly tortuous. LAD: Large-caliber vessel.  It is tortuous in the proximal and mid segment.  It is smooth and normal.    Impression: Normal LV systolic function, EF 55 to 60%.  Moderately elevated EDP. Normal coronary arteries, tortuous vessels suggestive of hypertensive heart disease.  Radiology:   Hospital Course: Verlisa Tackett is a 57 y.o. female  patient with history of hypertension, hyperlipidemia, who presents with dyspnea on exertion, patient states that she has had a coronary stent some 15 to 20 years ago done elsewhere.  In view of her marked symptoms of class III, she was recommended for cardiac catheterization especially in view of morbid obesity with high likelihood of false positive test, patient preference, she was scheduled for left heart catheterization.   Cardiac catheterization revealing normal coronary arteries however it was an ulnar approach as the radial artery was very small, ultrasound guidance was utilized.  Patient postprocedure developed right arm hematoma and although it was small, patient stated that she did not have anybody at home to stay with her I  did not feel comfortable going home hence was kept overnight.  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  Patient was not seen by me today but I did discuss with RN.  Recommendations on discharge: I have increased the dose of amlodipine to 10 mg daily and metoprolol succinate to 50 mg daily in view of uncontrolled hypertension, blood pressure has improved since being in the hospital.  Weight loss has been discussed with the patient.  I have spoken to the family members as well yesterday.  Discharge Exam:    04/30/2023    4:35 AM 04/29/2023   11:31 PM 04/29/2023    8:38 PM  Vitals with BMI  Systolic 135 137 409  Diastolic 75 70 77  Pulse 70 74 83     Physical Exam Neck:     Vascular: No carotid bruit or JVD.  Cardiovascular:     Rate and Rhythm: Normal rate and regular rhythm.     Pulses: Intact distal pulses.     Heart sounds: Normal heart sounds. No murmur heard.    No gallop.  Pulmonary:     Effort: Pulmonary effort is normal.     Breath sounds: Normal breath sounds.  Abdominal:     General: Bowel sounds are normal.     Palpations: Abdomen is soft.  Musculoskeletal:     Right lower leg: No edema.     Left lower leg: No edema.     Labs:   Lab Results  Component Value Date   WBC 7.3 04/26/2023  HGB 13.2 04/26/2023   HCT 41.4 04/26/2023   MCV 85 04/26/2023   PLT 274 04/26/2023    Recent Labs  Lab 04/26/23 0743  NA 142  K 4.7  CL 105  CO2 26  BUN 11  CREATININE 0.77  CALCIUM 9.2  PROT 5.4*  BILITOT <0.2  ALKPHOS 105  ALT 19  AST 14  GLUCOSE 93    Lipid Panel     Component Value Date/Time   CHOL 171 04/26/2023 0743   TRIG 85 04/26/2023 0743   HDL 47 04/26/2023 0743   LDLCALC 108 (H) 04/26/2023 0743   FOLLOW UP PLANS AND APPOINTMENTS Discharge Instructions     Diet - low sodium heart healthy   Complete by: As directed     Increase activity slowly   Complete by: As directed       Allergies as of 04/30/2023       Reactions   Morphine And Codeine Nausea And Vomiting, Hives, Itching   Trazodone Nausea Only   dizziness, headache   Penicillins Hives   Has patient had a PCN reaction causing immediate rash, facial/tongue/throat swelling, SOB or lightheadedness with hypotension: Yes Has patient had a PCN reaction causing severe rash involving mucus membranes or skin necrosis: Yes Has patient had a PCN reaction that required hospitalization No Has patient had a PCN reaction occurring within the last 10 years: No If all of the above answers are "NO", then may proceed with Cephalosporin use. Has patient had a PCN reaction causing immediate rash, facial/tongue/throat swelling, SOB or lightheadedness with hypotension: Yes  Has patient had a PCN reaction causing severe rash involving mucus membranes or skin necrosis: Yes  Has patient had a PCN reaction that required hospitalization No  Has patient had a PCN reaction occurring within the last 10 years: No  If all of the above answers are "NO", then may proceed with Cephalosporin use.   Tramadol    Upset stomach        Medication List     STOP taking these medications    aspirin EC 81 MG tablet       TAKE these medications    acetaminophen 650 MG CR tablet Commonly known as: Tylenol 8 Hour Take 1 tablet (650 mg total) by mouth every 8 (eight) hours as needed.   albuterol 108 (90 Base) MCG/ACT inhaler Commonly known as: VENTOLIN HFA Inhale 1-2 puffs into the lungs every 6 (six) hours as needed for wheezing or shortness of breath.   amLODipine 10 MG tablet Commonly known as: NORVASC Take 1 tablet (10 mg total) by mouth daily. What changed:  medication strength how much to take   Biotin 1000 MCG Chew Chew 5,000 mcg by mouth daily.   CRANBERRY PO Take 1 tablet by mouth daily.   cyanocobalamin 1000 MCG tablet Commonly known as: VITAMIN  B12 Take 1,000 mcg by mouth daily.   doxepin 25 MG capsule Commonly known as: SINEQUAN Take 25-50 mg by mouth at bedtime as needed (sleep aid).   ferrous sulfate 325 (65 FE) MG EC tablet Take 325 mg by mouth 2 (two) times a week.   Ginkgo 60 MG Tabs Take 60 mg by mouth daily.   levocetirizine 5 MG tablet Commonly known as: XYZAL Take 5 mg by mouth daily.   metoprolol succinate 50 MG 24 hr tablet Commonly known as: Toprol XL Take 1 tablet (50 mg total) by mouth daily. What changed:  medication strength how much to take   nitroGLYCERIN  0.4 MG SL tablet Commonly known as: Nitrostat Place 1 tablet (0.4 mg total) under the tongue every 5 (five) minutes as needed for chest pain. If you require more than two tablets five minutes apart go to the nearest ER via EMS.   ondansetron 8 MG disintegrating tablet Commonly known as: ZOFRAN-ODT Take 1 tablet (8 mg total) by mouth every 8 (eight) hours as needed for nausea or vomiting.   pantoprazole 20 MG tablet Commonly known as: PROTONIX Take 1 tablet (20 mg total) by mouth daily. What changed:  when to take this reasons to take this   pregabalin 50 MG capsule Commonly known as: LYRICA Take 1 capsule (50 mg total) by mouth 3 (three) times daily.   PROBIOTIC ACIDOPHILUS PO Take 1 tablet by mouth daily.   rosuvastatin 20 MG tablet Commonly known as: CRESTOR Take 20 mg by mouth daily.   tiZANidine 2 MG tablet Commonly known as: ZANAFLEX Take 1-2 tablets (2-4 mg total) by mouth every 6 (six) hours as needed for muscle spasms.   Vitamin D3 50 MCG (2000 UT) capsule Take 2,000 Units by mouth daily.   Vitamin E 180 MG (400 UNIT) Caps Take 400 Units by mouth daily.        Follow-up Information     Tessa Lerner, DO Follow up on 05/13/2023.   Specialties: Cardiology, Vascular Surgery Why: 05/13/2023 10:15 AM Contact information: [address] [place] [address] [phone]                  [name], MD, [facility] 04/30/2023, 7:50 AM Office: [phone]

## 2023-05-02 ENCOUNTER — Encounter (HOSPITAL_COMMUNITY): Payer: Self-pay | Admitting: Cardiology

## 2023-05-02 DIAGNOSIS — M9901 Segmental and somatic dysfunction of cervical region: Secondary | ICD-10-CM | POA: Diagnosis not present

## 2023-05-02 DIAGNOSIS — M9902 Segmental and somatic dysfunction of thoracic region: Secondary | ICD-10-CM | POA: Diagnosis not present

## 2023-05-02 DIAGNOSIS — M9903 Segmental and somatic dysfunction of lumbar region: Secondary | ICD-10-CM | POA: Diagnosis not present

## 2023-05-02 DIAGNOSIS — M5032 Other cervical disc degeneration, mid-cervical region, unspecified level: Secondary | ICD-10-CM | POA: Diagnosis not present

## 2023-05-02 DIAGNOSIS — M5417 Radiculopathy, lumbosacral region: Secondary | ICD-10-CM | POA: Diagnosis not present

## 2023-05-02 DIAGNOSIS — M5134 Other intervertebral disc degeneration, thoracic region: Secondary | ICD-10-CM | POA: Diagnosis not present

## 2023-05-03 LAB — LIPOPROTEIN A (LPA): Lipoprotein (a): 86.1 nmol/L — ABNORMAL HIGH (ref <75.0)

## 2023-05-03 NOTE — Progress Notes (Signed)
Lipoprotein (a) 05/03/2023:  <75.0 nmol/L 86.1 High

## 2023-05-04 DIAGNOSIS — M542 Cervicalgia: Secondary | ICD-10-CM | POA: Diagnosis not present

## 2023-05-05 DIAGNOSIS — R7303 Prediabetes: Secondary | ICD-10-CM | POA: Diagnosis not present

## 2023-05-05 DIAGNOSIS — I2510 Atherosclerotic heart disease of native coronary artery without angina pectoris: Secondary | ICD-10-CM | POA: Diagnosis not present

## 2023-05-05 DIAGNOSIS — M5134 Other intervertebral disc degeneration, thoracic region: Secondary | ICD-10-CM | POA: Diagnosis not present

## 2023-05-05 DIAGNOSIS — E782 Mixed hyperlipidemia: Secondary | ICD-10-CM | POA: Diagnosis not present

## 2023-05-05 DIAGNOSIS — I10 Essential (primary) hypertension: Secondary | ICD-10-CM | POA: Diagnosis not present

## 2023-05-05 DIAGNOSIS — I83893 Varicose veins of bilateral lower extremities with other complications: Secondary | ICD-10-CM | POA: Diagnosis not present

## 2023-05-05 DIAGNOSIS — J302 Other seasonal allergic rhinitis: Secondary | ICD-10-CM | POA: Diagnosis not present

## 2023-05-05 DIAGNOSIS — K219 Gastro-esophageal reflux disease without esophagitis: Secondary | ICD-10-CM | POA: Diagnosis not present

## 2023-05-05 DIAGNOSIS — F3341 Major depressive disorder, recurrent, in partial remission: Secondary | ICD-10-CM | POA: Diagnosis not present

## 2023-05-09 DIAGNOSIS — M9903 Segmental and somatic dysfunction of lumbar region: Secondary | ICD-10-CM | POA: Diagnosis not present

## 2023-05-09 DIAGNOSIS — M5134 Other intervertebral disc degeneration, thoracic region: Secondary | ICD-10-CM | POA: Diagnosis not present

## 2023-05-09 DIAGNOSIS — M5032 Other cervical disc degeneration, mid-cervical region, unspecified level: Secondary | ICD-10-CM | POA: Diagnosis not present

## 2023-05-09 DIAGNOSIS — M9901 Segmental and somatic dysfunction of cervical region: Secondary | ICD-10-CM | POA: Diagnosis not present

## 2023-05-09 DIAGNOSIS — M9902 Segmental and somatic dysfunction of thoracic region: Secondary | ICD-10-CM | POA: Diagnosis not present

## 2023-05-09 DIAGNOSIS — M5417 Radiculopathy, lumbosacral region: Secondary | ICD-10-CM | POA: Diagnosis not present

## 2023-05-13 ENCOUNTER — Ambulatory Visit: Payer: Medicare Other | Admitting: Cardiology

## 2023-05-13 ENCOUNTER — Encounter: Payer: Self-pay | Admitting: Cardiology

## 2023-05-13 VITALS — BP 122/83 | HR 69 | Resp 16 | Ht 64.0 in | Wt 237.4 lb

## 2023-05-13 DIAGNOSIS — E782 Mixed hyperlipidemia: Secondary | ICD-10-CM | POA: Diagnosis not present

## 2023-05-13 DIAGNOSIS — I119 Hypertensive heart disease without heart failure: Secondary | ICD-10-CM

## 2023-05-13 DIAGNOSIS — I10 Essential (primary) hypertension: Secondary | ICD-10-CM

## 2023-05-13 DIAGNOSIS — Z6841 Body Mass Index (BMI) 40.0 and over, adult: Secondary | ICD-10-CM | POA: Diagnosis not present

## 2023-05-13 NOTE — Progress Notes (Signed)
ID:  Jo Allen, DOB 05-07-1966, MRN [id number]  PCP:  Norm Salt, PA  Cardiologist:  Tessa Lerner, DO, [facility] (established care 04/14/2023)  Date: 05/13/23 Last Office Visit: 04/14/2023  Chief Complaint  Patient presents with   Post cath f/u    HPI  Jo Allen is a 57 y.o. African-American female whose past medical history and cardiovascular risk factors include: Hypertension, asthma, hypertensive heart disease,  varicose veins, hyperlipidemia, prediabetes, history of carpal tunnel, depression , obesity due to excess calories.   During last office visit patient's precordial discomfort was very suggestive of angina pectoris and given multiple cardiovascular risk factors which included but not limited to premature CAD, prior PCI, hyperlipidemia shared decision was to proceed with angiography to evaluate for obstructive disease.  She underwent left heart catheterization in July 2024 and was noted to have normal coronary arteries, moderately elevated LVEDP, findings to suggest hypertensive heart disease.  She presents today for follow-up. She denies anginal chest pain or heart failure symptoms.  During the right heart catheterization she was noted to have normal coronary arteries however there was an ulnar approach as the right radial artery was noted to be small, ultrasound guidance was utilized.  Unfortunately postprocedure she developed a right arm hematoma though small in size she was kept overnight for observation.  She was sent home the following day.  After going home patient stated that she had noticed a swelling in the right arm as well as ecchymosis.  Overall swelling and ecchymosis are improving.  She has good strength in bilateral upper extremities.  She denies anginal chest pain.  During that initial consultation patient stated that she had a stent within the coronary vasculature.  However, based on the heart catheterization report no stents were visualized.  Patient  states that the stents were placed more than 10 years ago in [place].  At the time of hospital discharge her medications were uptitrated-amlodipine to 10 mg p.o. daily metoprolol succinate to 50 mg p.o. daily her blood pressures since then have improved and she currently is asymptomatic.   FUNCTIONAL STATUS: No structured exercise program or daily routine.   CARDIAC DATABASE: EKG: April 14, 2023: Sinus rhythm, 70 bpm, without underlying ischemia or injury pattern.  Echocardiogram: 04/15/2023: Normal LV systolic function with visual EF 55-60%. Left ventricle cavity is normal in size. Normal left ventricular wall thickness. Presence of a septal bulge. Normal global wall motion. Normal diastolic filling pattern, normal LAP.  Mild mitral annular calcification, no evidence of mitral stenosis. No significant valvular heart disease.  No prior study for comparison.    Stress Testing: No results found for this or any previous visit from the past 1095 days.  Heart Catheterization: Left Heart Catheterization 04/29/23:  LV 131/12, EDP 19 mmHg.  Ao 161/83, mean 15 mmHg.  No pressure gradient across the aortic valve.  LVEF 55 to 60%. RCA: Dominant, smooth and normal. LM: Large-caliber vessel.  Smooth and normal. LCx: Very large caliber vessel, gives origin to a small AV groove branch and continues distally as to large distal circumflex gives origin to large OM1 and OM 2.  Smooth and normal.  Mildly tortuous. LAD: Large-caliber vessel.  It is tortuous in the proximal and mid segment.  It is smooth and normal.    Impression: Normal LV systolic function, EF 55 to 60%.  Moderately elevated EDP. Normal coronary arteries, tortuous vessels suggestive of hypertensive heart disease.    ALLERGIES: Allergies  Allergen Reactions   Morphine And Codeine  Nausea And Vomiting, Hives and Itching   Trazodone Nausea Only    dizziness, headache   Penicillins Hives    Has patient had a PCN reaction causing  immediate rash, facial/tongue/throat swelling, SOB or lightheadedness with hypotension: Yes  Has patient had a PCN reaction causing severe rash involving mucus membranes or skin necrosis: Yes  Has patient had a PCN reaction that required hospitalization No  Has patient had a PCN reaction occurring within the last 10 years: No  If all of the above answers are "NO", then may proceed with Cephalosporin use.  Has patient had a PCN reaction causing immediate rash, facial/tongue/throat swelling, SOB or lightheadedness with hypotension: Yes  Has patient had a PCN reaction causing severe rash involving mucus membranes or skin necrosis: Yes  Has patient had a PCN reaction that required hospitalization No  Has patient had a PCN reaction occurring within the last 10 years: No  If all of the above answers are "NO", then may proceed with Cephalosporin use.   Tramadol     Upset stomach    MEDICATION LIST PRIOR TO VISIT: Current Meds  Medication Sig   acetaminophen (TYLENOL 8 HOUR) 650 MG CR tablet Take 1 tablet (650 mg total) by mouth every 8 (eight) hours as needed.   albuterol (VENTOLIN HFA) 108 (90 Base) MCG/ACT inhaler Inhale 1-2 puffs into the lungs every 6 (six) hours as needed for wheezing or shortness of breath.   amLODipine (NORVASC) 10 MG tablet Take 1 tablet (10 mg total) by mouth daily.   Biotin 1000 MCG CHEW Chew 5,000 mcg by mouth daily.   Cholecalciferol (VITAMIN D3) 50 MCG (2000 UT) capsule Take 2,000 Units by mouth daily.   CRANBERRY PO Take 1 tablet by mouth daily.   doxepin (SINEQUAN) 25 MG capsule Take 25-50 mg by mouth at bedtime as needed (sleep aid).    ferrous sulfate 325 (65 FE) MG EC tablet Take 325 mg by mouth 2 (two) times a week.   Ginkgo 60 MG TABS Take 60 mg by mouth daily.   Lactobacillus (PROBIOTIC ACIDOPHILUS PO) Take 1 tablet by mouth daily.   levocetirizine (XYZAL) 5 MG tablet Take 5 mg by mouth daily.   Magnesium Gluconate (MAGNESIUM 27 PO) Take by mouth.    metoprolol succinate (TOPROL XL) 50 MG 24 hr tablet Take 1 tablet (50 mg total) by mouth daily.   nitroGLYCERIN (NITROSTAT) 0.4 MG SL tablet Place 1 tablet (0.4 mg total) under the tongue every 5 (five) minutes as needed for chest pain. If you require more than two tablets five minutes apart go to the nearest ER via EMS.   ondansetron (ZOFRAN-ODT) 8 MG disintegrating tablet Take 1 tablet (8 mg total) by mouth every 8 (eight) hours as needed for nausea or vomiting.   pantoprazole (PROTONIX) 20 MG tablet Take 1 tablet (20 mg total) by mouth daily. (Patient taking differently: Take 20 mg by mouth daily as needed for heartburn or indigestion.)   pregabalin (LYRICA) 50 MG capsule Take 1 capsule (50 mg total) by mouth 3 (three) times daily.   ramelteon (ROZEREM) 8 MG tablet Take 8 mg by mouth at bedtime.   rosuvastatin (CRESTOR) 20 MG tablet Take 20 mg by mouth daily.   tiZANidine (ZANAFLEX) 2 MG tablet Take 1-2 tablets (2-4 mg total) by mouth every 6 (six) hours as needed for muscle spasms.   vitamin B-12 (CYANOCOBALAMIN) 1000 MCG tablet Take 1,000 mcg by mouth daily.   Vitamin E 180 MG (400 UNIT)  CAPS Take 400 Units by mouth daily.     PAST MEDICAL HISTORY: Past Medical History:  Diagnosis Date   Asthma    CAD (coronary artery disease)    GERD (gastroesophageal reflux disease)    Headache    Heart disease    Hypertension    Obesity    Paresthesia 08/20/2015   Prediabetes    Transfusion history    65yrs ago    PAST SURGICAL HISTORY: Past Surgical History:  Procedure Laterality Date   ANGIOPLASTY     BREAST BIOPSY Left    BREAST BIOPSY WITH SENTINEL LYMPH NODE BIOPSY AND NEEDLE LOCALIZATION Left    COLONOSCOPY WITH PROPOFOL N/A 02/19/2014   Procedure: COLONOSCOPY WITH PROPOFOL;  Surgeon: Charolett Bumpers, MD;  Location: [facility] ENDOSCOPY;  Service: Endoscopy;  Laterality: N/A;   JOINT REPLACEMENT Bilateral    LEFT HEART CATH AND CORONARY ANGIOGRAPHY N/A 04/29/2023   Procedure: LEFT HEART CATH  AND CORONARY ANGIOGRAPHY;  Surgeon: [name], MD;  Location: [facility] INVASIVE CV [facility];  Service: Cardiovascular;  Laterality: N/A;   TOTAL KNEE ARTHROPLASTY Bilateral     FAMILY HISTORY: The patient family history includes Depression in her brother; Diabetes in her mother; Headache in her sister; Hypertension in her mother; Stroke in her father.  SOCIAL HISTORY:  The patient  reports that she has never smoked. She has never used smokeless tobacco. She reports that she does not drink alcohol and does not use drugs.  REVIEW OF SYSTEMS: Review of Systems  Cardiovascular:  Negative for chest pain, claudication, dyspnea on exertion, irregular heartbeat, leg swelling, near-syncope, orthopnea, palpitations, paroxysmal nocturnal dyspnea and syncope.  Respiratory:  Negative for shortness of breath.   Hematologic/Lymphatic: Negative for bleeding problem.  Musculoskeletal:  Negative for muscle cramps and myalgias.  Neurological:  Negative for dizziness and light-headedness.    PHYSICAL EXAM:    05/13/2023   10:23 AM 04/30/2023    4:35 AM 04/29/2023   11:31 PM  Vitals with BMI  Height 5\' 4"     Weight 237 lbs 6 oz    BMI 40.73    Systolic 122 135 259  Diastolic 83 75 70  Pulse 69 70 74    Physical Exam  Constitutional: No distress.  Age appropriate, hemodynamically stable.   Neck: No JVD present.  Cardiovascular: Normal rate, regular rhythm, S1 normal, S2 normal, intact distal pulses and normal pulses. Exam reveals no gallop, no S3 and no S4.  No murmur heard. Pulmonary/Chest: Effort normal and breath sounds normal. No stridor. She has no wheezes. She has no rales.  Abdominal: Soft. Bowel sounds are normal. She exhibits no distension. There is no abdominal tenderness.  Musculoskeletal:        General: No edema.     Cervical back: Neck supple.     Comments: Right radial and ulnar sites are healed well.  No hematoma noted.  Mild ecchymosis along the ulnar and radial aspect of the forearm.  No  bruits auscultated.  Neurological: She is alert and oriented to person, place, and time. She has intact cranial nerves (2-12).  Skin: Skin is warm and moist.   LABORATORY DATA: External Labs: Collected: Mar 01, 2023 provided by referring physician. BUN 12, creatinine 0.79. eGFR 87. Sodium 142, potassium 4.5, chloride 106, bicarb 28     Latest Ref Rng & Units 04/26/2023    7:43 AM 03/24/2023    2:15 PM 06/30/2021   11:48 PM  CBC  WBC 3.4 - 10.8 x10E3/uL 7.3  6.2  7.5   Hemoglobin 11.1 - 15.9 g/dL 40.1  02.7  25.3   Hematocrit 34.0 - 46.6 % 41.4  42.6  43.0   Platelets 150 - 450 x10E3/uL 274  260  275        Latest Ref Rng & Units 04/26/2023    7:43 AM 03/24/2023    2:15 PM 06/30/2021   11:48 PM  CMP  Glucose 70 - 99 mg/dL 93  96  664   BUN 6 - 24 mg/dL 11  7  13    Creatinine 0.57 - 1.00 mg/dL 4.03  4.74  2.59   Sodium 134 - 144 mmol/L 142  142  137   Potassium 3.5 - 5.2 mmol/L 4.7  3.8  3.6   Chloride 96 - 106 mmol/L 105  109  102   CO2 20 - 29 mmol/L 26  28  29    Calcium 8.7 - 10.2 mg/dL 9.2  9.1  9.4   Total Protein 6.0 - 8.5 g/dL 5.4  5.8  7.6   Total Bilirubin 0.0 - 1.2 mg/dL <5.6  0.5  0.4   Alkaline Phos 44 - 121 IU/L 105  83  105   AST 0 - 40 IU/L 14  22  22    ALT 0 - 32 IU/L 19  20  18      Lab Results  Component Value Date   CHOL 171 04/26/2023   HDL 47 04/26/2023   LDLCALC 108 (H) 04/26/2023   LDLDIRECT 113 (H) 04/26/2023   TRIG 85 04/26/2023   No components found for: "NTPROBNP" No results for input(s): "PROBNP" in the last 8760 hours. No results for input(s): "TSH" in the last 8760 hours.  BMP Recent Labs    03/24/23 1415 04/26/23 0743  NA 142 142  K 3.8 4.7  CL 109 105  CO2 28 26  GLUCOSE 96 93  BUN 7 11  CREATININE 0.85 0.77  CALCIUM 9.1 9.2  GFRNONAA >60  --     HEMOGLOBIN A1C No results found for: "HGBA1C", "MPG"  IMPRESSION:    ICD-10-CM   1. Hypertensive heart disease without heart failure  I11.9     2. Benign hypertension  I10      3. Mixed hyperlipidemia  E78.2     4. Class 3 severe obesity due to excess calories with serious comorbidity and body mass index (BMI) of 40.0 to 44.9 in adult [facility])  E66.01    Z68.41         RECOMMENDATIONS: Kalliyan Gunnett is a 57 y.o. African-American female whose past medical history and cardiac risk factors include: Hypertension, asthma, hypertensive heart disease,  varicose veins, hyperlipidemia, prediabetes, history of carpal tunnel, depression , obesity due to excess calories.   During initial consultation patient's precordial discomfort was concerning for anginal discomfort. She also reported having history of CAD with prior stents.  And given her additional cardiovascular risk factors that shared decision was to proceed with angiography.  Patient was found to have no obstructive disease and findings were consistent with hypertensive heart disease.  The angiogram did not illustrate the presence of prior stents.  The patient was adamant that more than 10 years ago when she was living in [place] she did have coronary interventions performed.  She does not have any documentation with regards to what type of stent and where it was placed.  Right radial site is healing well.  She still has some ecchymosis.  She has good movement of the arm.  Which is scanned in the media section.  Educated her on the importance of improving her modifiable cardiovascular risk factors which include but not limited to blood pressure management, lipid management, and weight loss.  Since the uptitration of amlodipine and Toprol-XL patient's blood pressures have improved continue current medical therapy.  Most recent lipid profile from July 2024 independently reviewed, direct LDL is 113 mg/dL, shared decision was to continue current dose of statin and she will follow-up with PCP.    Would like to see him back in 6 months or sooner if needed.   FINAL MEDICATION LIST END OF ENCOUNTER: No orders of the  defined types were placed in this encounter.   There are no discontinued medications.    Current Outpatient Medications:    acetaminophen (TYLENOL 8 HOUR) 650 MG CR tablet, Take 1 tablet (650 mg total) by mouth every 8 (eight) hours as needed., Disp: 30 tablet, Rfl: 0   albuterol (VENTOLIN HFA) 108 (90 Base) MCG/ACT inhaler, Inhale 1-2 puffs into the lungs every 6 (six) hours as needed for wheezing or shortness of breath., Disp: 1 each, Rfl: 0   amLODipine (NORVASC) 10 MG tablet, Take 1 tablet (10 mg total) by mouth daily., Disp: 90 tablet, Rfl: 1   Biotin 1000 MCG CHEW, Chew 5,000 mcg by mouth daily., Disp: , Rfl:    Cholecalciferol (VITAMIN D3) 50 MCG (2000 UT) capsule, Take 2,000 Units by mouth daily., Disp: , Rfl:    CRANBERRY PO, Take 1 tablet by mouth daily., Disp: , Rfl:    doxepin (SINEQUAN) 25 MG capsule, Take 25-50 mg by mouth at bedtime as needed (sleep aid). , Disp: , Rfl:    ferrous sulfate 325 (65 FE) MG EC tablet, Take 325 mg by mouth 2 (two) times a week., Disp: , Rfl:    Ginkgo 60 MG TABS, Take 60 mg by mouth daily., Disp: , Rfl:    Lactobacillus (PROBIOTIC ACIDOPHILUS PO), Take 1 tablet by mouth daily., Disp: , Rfl:    levocetirizine (XYZAL) 5 MG tablet, Take 5 mg by mouth daily., Disp: , Rfl:    Magnesium Gluconate (MAGNESIUM 27 PO), Take by mouth., Disp: , Rfl:    metoprolol succinate (TOPROL XL) 50 MG 24 hr tablet, Take 1 tablet (50 mg total) by mouth daily., Disp: 90 tablet, Rfl: 1   nitroGLYCERIN (NITROSTAT) 0.4 MG SL tablet, Place 1 tablet (0.4 mg total) under the tongue every 5 (five) minutes as needed for chest pain. If you require more than two tablets five minutes apart go to the nearest ER via EMS., Disp: 30 tablet, Rfl: 0   ondansetron (ZOFRAN-ODT) 8 MG disintegrating tablet, Take 1 tablet (8 mg total) by mouth every 8 (eight) hours as needed for nausea or vomiting., Disp: 12 tablet, Rfl: 0   pantoprazole (PROTONIX) 20 MG tablet, Take 1 tablet (20 mg total) by mouth  daily. (Patient taking differently: Take 20 mg by mouth daily as needed for heartburn or indigestion.), Disp: 14 tablet, Rfl: 0   pregabalin (LYRICA) 50 MG capsule, Take 1 capsule (50 mg total) by mouth 3 (three) times daily., Disp: 60 capsule, Rfl: 5   ramelteon (ROZEREM) 8 MG tablet, Take 8 mg by mouth at bedtime., Disp: , Rfl:    rosuvastatin (CRESTOR) 20 MG tablet, Take 20 mg by mouth daily., Disp: , Rfl:    tiZANidine (ZANAFLEX) 2 MG tablet, Take 1-2 tablets (2-4 mg total) by mouth every 6 (six) hours as needed for muscle spasms., Disp: 30  tablet, Rfl: 0   vitamin B-12 (CYANOCOBALAMIN) 1000 MCG tablet, Take 1,000 mcg by mouth daily., Disp: , Rfl:    Vitamin E 180 MG (400 UNIT) CAPS, Take 400 Units by mouth daily., Disp: , Rfl:   No orders of the defined types were placed in this encounter.   There are no Patient Instructions on file for this visit.   --Continue cardiac medications as reconciled in final medication list. --Return in about 6 months (around 11/13/2023) for Follow up hypertensive heart disease. . or sooner if needed. --Continue follow-up with your primary care physician regarding the management of your other chronic comorbid conditions.  Patient's questions and concerns were addressed to her satisfaction. She voices understanding of the instructions provided during this encounter.   This note was created using a voice recognition software as a result there may be grammatical errors inadvertently enclosed that do not reflect the nature of this encounter. Every attempt is made to correct such errors.  Tessa Lerner, [place], [facility]  Pager:  [phone] Office: [phone]

## 2023-05-16 DIAGNOSIS — M5032 Other cervical disc degeneration, mid-cervical region, unspecified level: Secondary | ICD-10-CM | POA: Diagnosis not present

## 2023-05-16 DIAGNOSIS — M9901 Segmental and somatic dysfunction of cervical region: Secondary | ICD-10-CM | POA: Diagnosis not present

## 2023-05-16 DIAGNOSIS — M5417 Radiculopathy, lumbosacral region: Secondary | ICD-10-CM | POA: Diagnosis not present

## 2023-05-16 DIAGNOSIS — M9902 Segmental and somatic dysfunction of thoracic region: Secondary | ICD-10-CM | POA: Diagnosis not present

## 2023-05-16 DIAGNOSIS — M5134 Other intervertebral disc degeneration, thoracic region: Secondary | ICD-10-CM | POA: Diagnosis not present

## 2023-05-16 DIAGNOSIS — M9903 Segmental and somatic dysfunction of lumbar region: Secondary | ICD-10-CM | POA: Diagnosis not present

## 2023-05-17 DIAGNOSIS — M546 Pain in thoracic spine: Secondary | ICD-10-CM | POA: Diagnosis not present

## 2023-05-17 DIAGNOSIS — M545 Low back pain, unspecified: Secondary | ICD-10-CM | POA: Diagnosis not present

## 2023-05-19 DIAGNOSIS — Z124 Encounter for screening for malignant neoplasm of cervix: Secondary | ICD-10-CM | POA: Diagnosis not present

## 2023-05-19 DIAGNOSIS — M5134 Other intervertebral disc degeneration, thoracic region: Secondary | ICD-10-CM | POA: Diagnosis not present

## 2023-05-23 DIAGNOSIS — M5417 Radiculopathy, lumbosacral region: Secondary | ICD-10-CM | POA: Diagnosis not present

## 2023-05-23 DIAGNOSIS — M9903 Segmental and somatic dysfunction of lumbar region: Secondary | ICD-10-CM | POA: Diagnosis not present

## 2023-05-23 DIAGNOSIS — M5032 Other cervical disc degeneration, mid-cervical region, unspecified level: Secondary | ICD-10-CM | POA: Diagnosis not present

## 2023-05-23 DIAGNOSIS — M5134 Other intervertebral disc degeneration, thoracic region: Secondary | ICD-10-CM | POA: Diagnosis not present

## 2023-05-23 DIAGNOSIS — M9902 Segmental and somatic dysfunction of thoracic region: Secondary | ICD-10-CM | POA: Diagnosis not present

## 2023-05-23 DIAGNOSIS — M9901 Segmental and somatic dysfunction of cervical region: Secondary | ICD-10-CM | POA: Diagnosis not present

## 2023-05-24 DIAGNOSIS — F319 Bipolar disorder, unspecified: Secondary | ICD-10-CM | POA: Diagnosis not present

## 2023-05-24 DIAGNOSIS — F410 Panic disorder [episodic paroxysmal anxiety] without agoraphobia: Secondary | ICD-10-CM | POA: Diagnosis not present

## 2023-05-25 DIAGNOSIS — G8929 Other chronic pain: Secondary | ICD-10-CM | POA: Diagnosis not present

## 2023-05-25 DIAGNOSIS — M545 Low back pain, unspecified: Secondary | ICD-10-CM | POA: Diagnosis not present

## 2023-05-25 DIAGNOSIS — M47816 Spondylosis without myelopathy or radiculopathy, lumbar region: Secondary | ICD-10-CM | POA: Diagnosis not present

## 2023-05-25 DIAGNOSIS — M5135 Other intervertebral disc degeneration, thoracolumbar region: Secondary | ICD-10-CM | POA: Diagnosis not present

## 2023-05-25 DIAGNOSIS — M5126 Other intervertebral disc displacement, lumbar region: Secondary | ICD-10-CM | POA: Diagnosis not present

## 2023-05-25 DIAGNOSIS — S39012D Strain of muscle, fascia and tendon of lower back, subsequent encounter: Secondary | ICD-10-CM | POA: Diagnosis not present

## 2023-05-30 DIAGNOSIS — M9903 Segmental and somatic dysfunction of lumbar region: Secondary | ICD-10-CM | POA: Diagnosis not present

## 2023-05-30 DIAGNOSIS — M5032 Other cervical disc degeneration, mid-cervical region, unspecified level: Secondary | ICD-10-CM | POA: Diagnosis not present

## 2023-05-30 DIAGNOSIS — M9902 Segmental and somatic dysfunction of thoracic region: Secondary | ICD-10-CM | POA: Diagnosis not present

## 2023-05-30 DIAGNOSIS — M9901 Segmental and somatic dysfunction of cervical region: Secondary | ICD-10-CM | POA: Diagnosis not present

## 2023-05-30 DIAGNOSIS — M5417 Radiculopathy, lumbosacral region: Secondary | ICD-10-CM | POA: Diagnosis not present

## 2023-05-30 DIAGNOSIS — M5134 Other intervertebral disc degeneration, thoracic region: Secondary | ICD-10-CM | POA: Diagnosis not present

## 2023-06-03 DIAGNOSIS — S39012D Strain of muscle, fascia and tendon of lower back, subsequent encounter: Secondary | ICD-10-CM | POA: Diagnosis not present

## 2023-06-13 DIAGNOSIS — M5032 Other cervical disc degeneration, mid-cervical region, unspecified level: Secondary | ICD-10-CM | POA: Diagnosis not present

## 2023-06-13 DIAGNOSIS — M9902 Segmental and somatic dysfunction of thoracic region: Secondary | ICD-10-CM | POA: Diagnosis not present

## 2023-06-13 DIAGNOSIS — M5417 Radiculopathy, lumbosacral region: Secondary | ICD-10-CM | POA: Diagnosis not present

## 2023-06-13 DIAGNOSIS — M9903 Segmental and somatic dysfunction of lumbar region: Secondary | ICD-10-CM | POA: Diagnosis not present

## 2023-06-13 DIAGNOSIS — M9901 Segmental and somatic dysfunction of cervical region: Secondary | ICD-10-CM | POA: Diagnosis not present

## 2023-06-13 DIAGNOSIS — M5134 Other intervertebral disc degeneration, thoracic region: Secondary | ICD-10-CM | POA: Diagnosis not present

## 2023-06-16 DIAGNOSIS — S39012D Strain of muscle, fascia and tendon of lower back, subsequent encounter: Secondary | ICD-10-CM | POA: Diagnosis not present

## 2023-06-21 DIAGNOSIS — S39012D Strain of muscle, fascia and tendon of lower back, subsequent encounter: Secondary | ICD-10-CM | POA: Diagnosis not present

## 2023-07-01 DIAGNOSIS — S39012D Strain of muscle, fascia and tendon of lower back, subsequent encounter: Secondary | ICD-10-CM | POA: Diagnosis not present

## 2023-07-05 DIAGNOSIS — F319 Bipolar disorder, unspecified: Secondary | ICD-10-CM | POA: Diagnosis not present

## 2023-07-05 DIAGNOSIS — F410 Panic disorder [episodic paroxysmal anxiety] without agoraphobia: Secondary | ICD-10-CM | POA: Diagnosis not present

## 2023-07-08 DIAGNOSIS — S39012D Strain of muscle, fascia and tendon of lower back, subsequent encounter: Secondary | ICD-10-CM | POA: Diagnosis not present

## 2023-07-12 DIAGNOSIS — J302 Other seasonal allergic rhinitis: Secondary | ICD-10-CM | POA: Diagnosis not present

## 2023-07-12 DIAGNOSIS — Z23 Encounter for immunization: Secondary | ICD-10-CM | POA: Diagnosis not present

## 2023-07-12 DIAGNOSIS — J4520 Mild intermittent asthma, uncomplicated: Secondary | ICD-10-CM | POA: Diagnosis not present

## 2023-07-12 DIAGNOSIS — I2510 Atherosclerotic heart disease of native coronary artery without angina pectoris: Secondary | ICD-10-CM | POA: Diagnosis not present

## 2023-07-12 DIAGNOSIS — I10 Essential (primary) hypertension: Secondary | ICD-10-CM | POA: Diagnosis not present

## 2023-07-12 DIAGNOSIS — R7303 Prediabetes: Secondary | ICD-10-CM | POA: Diagnosis not present

## 2023-07-12 DIAGNOSIS — K219 Gastro-esophageal reflux disease without esophagitis: Secondary | ICD-10-CM | POA: Diagnosis not present

## 2023-07-12 DIAGNOSIS — E782 Mixed hyperlipidemia: Secondary | ICD-10-CM | POA: Diagnosis not present

## 2023-07-13 DIAGNOSIS — M9901 Segmental and somatic dysfunction of cervical region: Secondary | ICD-10-CM | POA: Diagnosis not present

## 2023-07-13 DIAGNOSIS — M9903 Segmental and somatic dysfunction of lumbar region: Secondary | ICD-10-CM | POA: Diagnosis not present

## 2023-07-13 DIAGNOSIS — M5417 Radiculopathy, lumbosacral region: Secondary | ICD-10-CM | POA: Diagnosis not present

## 2023-07-13 DIAGNOSIS — M5134 Other intervertebral disc degeneration, thoracic region: Secondary | ICD-10-CM | POA: Diagnosis not present

## 2023-07-13 DIAGNOSIS — M5032 Other cervical disc degeneration, mid-cervical region, unspecified level: Secondary | ICD-10-CM | POA: Diagnosis not present

## 2023-07-13 DIAGNOSIS — M9902 Segmental and somatic dysfunction of thoracic region: Secondary | ICD-10-CM | POA: Diagnosis not present

## 2023-07-15 DIAGNOSIS — M47816 Spondylosis without myelopathy or radiculopathy, lumbar region: Secondary | ICD-10-CM | POA: Diagnosis not present

## 2023-07-26 DIAGNOSIS — I10 Essential (primary) hypertension: Secondary | ICD-10-CM | POA: Diagnosis not present

## 2023-07-26 DIAGNOSIS — K219 Gastro-esophageal reflux disease without esophagitis: Secondary | ICD-10-CM | POA: Diagnosis not present

## 2023-07-26 DIAGNOSIS — R7303 Prediabetes: Secondary | ICD-10-CM | POA: Diagnosis not present

## 2023-07-26 DIAGNOSIS — E782 Mixed hyperlipidemia: Secondary | ICD-10-CM | POA: Diagnosis not present

## 2023-07-26 DIAGNOSIS — J4530 Mild persistent asthma, uncomplicated: Secondary | ICD-10-CM | POA: Diagnosis not present

## 2023-07-26 DIAGNOSIS — I2510 Atherosclerotic heart disease of native coronary artery without angina pectoris: Secondary | ICD-10-CM | POA: Diagnosis not present

## 2023-08-02 DIAGNOSIS — M5126 Other intervertebral disc displacement, lumbar region: Secondary | ICD-10-CM | POA: Diagnosis not present

## 2023-08-03 DIAGNOSIS — M9903 Segmental and somatic dysfunction of lumbar region: Secondary | ICD-10-CM | POA: Diagnosis not present

## 2023-08-03 DIAGNOSIS — M5134 Other intervertebral disc degeneration, thoracic region: Secondary | ICD-10-CM | POA: Diagnosis not present

## 2023-08-03 DIAGNOSIS — M9901 Segmental and somatic dysfunction of cervical region: Secondary | ICD-10-CM | POA: Diagnosis not present

## 2023-08-03 DIAGNOSIS — M5032 Other cervical disc degeneration, mid-cervical region, unspecified level: Secondary | ICD-10-CM | POA: Diagnosis not present

## 2023-08-03 DIAGNOSIS — M5417 Radiculopathy, lumbosacral region: Secondary | ICD-10-CM | POA: Diagnosis not present

## 2023-08-03 DIAGNOSIS — M9902 Segmental and somatic dysfunction of thoracic region: Secondary | ICD-10-CM | POA: Diagnosis not present

## 2023-08-04 DIAGNOSIS — M25561 Pain in right knee: Secondary | ICD-10-CM | POA: Diagnosis not present

## 2023-08-04 DIAGNOSIS — M545 Low back pain, unspecified: Secondary | ICD-10-CM | POA: Diagnosis not present

## 2023-08-04 DIAGNOSIS — Z79891 Long term (current) use of opiate analgesic: Secondary | ICD-10-CM | POA: Diagnosis not present

## 2023-08-04 DIAGNOSIS — G894 Chronic pain syndrome: Secondary | ICD-10-CM | POA: Diagnosis not present

## 2023-08-04 DIAGNOSIS — M542 Cervicalgia: Secondary | ICD-10-CM | POA: Diagnosis not present

## 2023-08-04 DIAGNOSIS — M5137 Other intervertebral disc degeneration, lumbosacral region with discogenic back pain only: Secondary | ICD-10-CM | POA: Diagnosis not present

## 2023-08-04 DIAGNOSIS — M25562 Pain in left knee: Secondary | ICD-10-CM | POA: Diagnosis not present

## 2023-08-05 DIAGNOSIS — R7303 Prediabetes: Secondary | ICD-10-CM | POA: Diagnosis not present

## 2023-08-05 DIAGNOSIS — M25561 Pain in right knee: Secondary | ICD-10-CM | POA: Diagnosis not present

## 2023-08-05 DIAGNOSIS — I2510 Atherosclerotic heart disease of native coronary artery without angina pectoris: Secondary | ICD-10-CM | POA: Diagnosis not present

## 2023-08-05 DIAGNOSIS — K219 Gastro-esophageal reflux disease without esophagitis: Secondary | ICD-10-CM | POA: Diagnosis not present

## 2023-08-05 DIAGNOSIS — J4530 Mild persistent asthma, uncomplicated: Secondary | ICD-10-CM | POA: Diagnosis not present

## 2023-08-05 DIAGNOSIS — I10 Essential (primary) hypertension: Secondary | ICD-10-CM | POA: Diagnosis not present

## 2023-08-05 DIAGNOSIS — E782 Mixed hyperlipidemia: Secondary | ICD-10-CM | POA: Diagnosis not present

## 2023-08-18 ENCOUNTER — Ambulatory Visit
Admission: RE | Admit: 2023-08-18 | Discharge: 2023-08-18 | Disposition: A | Discharge status: Home / Self Care | Payer: Medicare Other | Source: Ambulatory Visit | Attending: Physician Assistant | Admitting: Physician Assistant

## 2023-08-18 DIAGNOSIS — Z1231 Encounter for screening mammogram for malignant neoplasm of breast: Secondary | ICD-10-CM

## 2023-09-01 DIAGNOSIS — M25562 Pain in left knee: Secondary | ICD-10-CM | POA: Diagnosis not present

## 2023-09-01 DIAGNOSIS — M5137 Other intervertebral disc degeneration, lumbosacral region with discogenic back pain only: Secondary | ICD-10-CM | POA: Diagnosis not present

## 2023-09-01 DIAGNOSIS — G894 Chronic pain syndrome: Secondary | ICD-10-CM | POA: Diagnosis not present

## 2023-09-01 DIAGNOSIS — Z79891 Long term (current) use of opiate analgesic: Secondary | ICD-10-CM | POA: Diagnosis not present

## 2023-09-01 DIAGNOSIS — M545 Low back pain, unspecified: Secondary | ICD-10-CM | POA: Diagnosis not present

## 2023-09-01 DIAGNOSIS — M25561 Pain in right knee: Secondary | ICD-10-CM | POA: Diagnosis not present

## 2023-09-02 DIAGNOSIS — M5416 Radiculopathy, lumbar region: Secondary | ICD-10-CM | POA: Diagnosis not present

## 2023-09-27 DIAGNOSIS — M5416 Radiculopathy, lumbar region: Secondary | ICD-10-CM | POA: Diagnosis not present

## 2023-09-27 DIAGNOSIS — M47816 Spondylosis without myelopathy or radiculopathy, lumbar region: Secondary | ICD-10-CM | POA: Diagnosis not present

## 2023-09-29 DIAGNOSIS — F319 Bipolar disorder, unspecified: Secondary | ICD-10-CM | POA: Diagnosis not present

## 2023-09-29 DIAGNOSIS — M25561 Pain in right knee: Secondary | ICD-10-CM | POA: Diagnosis not present

## 2023-09-29 DIAGNOSIS — Z79891 Long term (current) use of opiate analgesic: Secondary | ICD-10-CM | POA: Diagnosis not present

## 2023-09-29 DIAGNOSIS — G894 Chronic pain syndrome: Secondary | ICD-10-CM | POA: Diagnosis not present

## 2023-10-20 DIAGNOSIS — J0100 Acute maxillary sinusitis, unspecified: Secondary | ICD-10-CM | POA: Diagnosis not present

## 2023-10-20 DIAGNOSIS — M25561 Pain in right knee: Secondary | ICD-10-CM | POA: Diagnosis not present

## 2023-10-20 DIAGNOSIS — R051 Acute cough: Secondary | ICD-10-CM | POA: Diagnosis not present

## 2023-11-01 DIAGNOSIS — E782 Mixed hyperlipidemia: Secondary | ICD-10-CM | POA: Diagnosis not present

## 2023-11-01 DIAGNOSIS — Z0001 Encounter for general adult medical examination with abnormal findings: Secondary | ICD-10-CM | POA: Diagnosis not present

## 2023-11-01 DIAGNOSIS — I2510 Atherosclerotic heart disease of native coronary artery without angina pectoris: Secondary | ICD-10-CM | POA: Diagnosis not present

## 2023-11-01 DIAGNOSIS — I10 Essential (primary) hypertension: Secondary | ICD-10-CM | POA: Diagnosis not present

## 2023-11-01 DIAGNOSIS — J4530 Mild persistent asthma, uncomplicated: Secondary | ICD-10-CM | POA: Diagnosis not present

## 2023-11-01 DIAGNOSIS — K219 Gastro-esophageal reflux disease without esophagitis: Secondary | ICD-10-CM | POA: Diagnosis not present

## 2023-11-01 DIAGNOSIS — R7303 Prediabetes: Secondary | ICD-10-CM | POA: Diagnosis not present

## 2023-11-10 DIAGNOSIS — G894 Chronic pain syndrome: Secondary | ICD-10-CM | POA: Diagnosis not present

## 2023-11-10 DIAGNOSIS — M545 Low back pain, unspecified: Secondary | ICD-10-CM | POA: Diagnosis not present

## 2023-11-10 DIAGNOSIS — M542 Cervicalgia: Secondary | ICD-10-CM | POA: Diagnosis not present

## 2023-11-10 DIAGNOSIS — M25561 Pain in right knee: Secondary | ICD-10-CM | POA: Diagnosis not present

## 2023-11-10 DIAGNOSIS — Z79891 Long term (current) use of opiate analgesic: Secondary | ICD-10-CM | POA: Diagnosis not present

## 2023-11-14 ENCOUNTER — Ambulatory Visit: Payer: Self-pay | Admitting: Cardiology

## 2023-11-16 ENCOUNTER — Other Ambulatory Visit (HOSPITAL_COMMUNITY): Payer: Self-pay | Admitting: Physician Assistant

## 2023-11-16 DIAGNOSIS — M25562 Pain in left knee: Secondary | ICD-10-CM | POA: Diagnosis not present

## 2023-11-16 DIAGNOSIS — M25561 Pain in right knee: Secondary | ICD-10-CM

## 2023-11-24 DIAGNOSIS — M25562 Pain in left knee: Secondary | ICD-10-CM | POA: Diagnosis not present

## 2023-11-24 DIAGNOSIS — M25561 Pain in right knee: Secondary | ICD-10-CM | POA: Diagnosis not present

## 2023-11-24 DIAGNOSIS — M25661 Stiffness of right knee, not elsewhere classified: Secondary | ICD-10-CM | POA: Diagnosis not present

## 2023-11-24 DIAGNOSIS — M6281 Muscle weakness (generalized): Secondary | ICD-10-CM | POA: Diagnosis not present

## 2023-11-24 DIAGNOSIS — M25662 Stiffness of left knee, not elsewhere classified: Secondary | ICD-10-CM | POA: Diagnosis not present

## 2023-11-25 ENCOUNTER — Encounter (HOSPITAL_COMMUNITY)
Admission: RE | Admit: 2023-11-25 | Discharge: 2023-11-25 | Disposition: A | Discharge status: Home / Self Care | Payer: Medicare Other | Source: Ambulatory Visit | Attending: Physician Assistant | Admitting: Physician Assistant

## 2023-11-25 DIAGNOSIS — M25562 Pain in left knee: Secondary | ICD-10-CM | POA: Insufficient documentation

## 2023-11-25 DIAGNOSIS — M25561 Pain in right knee: Secondary | ICD-10-CM | POA: Insufficient documentation

## 2023-11-25 DIAGNOSIS — M25569 Pain in unspecified knee: Secondary | ICD-10-CM | POA: Diagnosis not present

## 2023-11-25 DIAGNOSIS — Z96653 Presence of artificial knee joint, bilateral: Secondary | ICD-10-CM | POA: Diagnosis not present

## 2023-11-25 MED ORDER — TECHNETIUM TC 99M MEDRONATE IV KIT
20.0000 | PACK | Freq: Once | INTRAVENOUS | Status: AC | PRN
  Administered 2023-11-25: 20 via INTRAVENOUS

## 2023-11-29 DIAGNOSIS — M25561 Pain in right knee: Secondary | ICD-10-CM | POA: Diagnosis not present

## 2023-11-29 DIAGNOSIS — M25562 Pain in left knee: Secondary | ICD-10-CM | POA: Diagnosis not present

## 2023-11-30 DIAGNOSIS — F319 Bipolar disorder, unspecified: Secondary | ICD-10-CM | POA: Diagnosis not present

## 2023-11-30 DIAGNOSIS — F410 Panic disorder [episodic paroxysmal anxiety] without agoraphobia: Secondary | ICD-10-CM | POA: Diagnosis not present

## 2023-12-02 DIAGNOSIS — M25562 Pain in left knee: Secondary | ICD-10-CM | POA: Diagnosis not present

## 2023-12-02 DIAGNOSIS — M25561 Pain in right knee: Secondary | ICD-10-CM | POA: Diagnosis not present

## 2023-12-06 DIAGNOSIS — M25561 Pain in right knee: Secondary | ICD-10-CM | POA: Diagnosis not present

## 2023-12-06 DIAGNOSIS — M25562 Pain in left knee: Secondary | ICD-10-CM | POA: Diagnosis not present

## 2023-12-08 DIAGNOSIS — M25561 Pain in right knee: Secondary | ICD-10-CM | POA: Diagnosis not present

## 2023-12-08 DIAGNOSIS — G894 Chronic pain syndrome: Secondary | ICD-10-CM | POA: Diagnosis not present

## 2023-12-08 DIAGNOSIS — M545 Low back pain, unspecified: Secondary | ICD-10-CM | POA: Diagnosis not present

## 2023-12-08 DIAGNOSIS — Z79891 Long term (current) use of opiate analgesic: Secondary | ICD-10-CM | POA: Diagnosis not present

## 2023-12-08 DIAGNOSIS — M542 Cervicalgia: Secondary | ICD-10-CM | POA: Diagnosis not present

## 2023-12-13 DIAGNOSIS — M25562 Pain in left knee: Secondary | ICD-10-CM | POA: Diagnosis not present

## 2023-12-13 DIAGNOSIS — M25561 Pain in right knee: Secondary | ICD-10-CM | POA: Diagnosis not present

## 2023-12-15 DIAGNOSIS — M25561 Pain in right knee: Secondary | ICD-10-CM | POA: Diagnosis not present

## 2023-12-15 DIAGNOSIS — M25562 Pain in left knee: Secondary | ICD-10-CM | POA: Diagnosis not present

## 2023-12-19 DIAGNOSIS — E785 Hyperlipidemia, unspecified: Secondary | ICD-10-CM | POA: Diagnosis not present

## 2023-12-19 DIAGNOSIS — G4730 Sleep apnea, unspecified: Secondary | ICD-10-CM | POA: Diagnosis not present

## 2023-12-19 DIAGNOSIS — Z1330 Encounter for screening examination for mental health and behavioral disorders, unspecified: Secondary | ICD-10-CM | POA: Diagnosis not present

## 2023-12-19 DIAGNOSIS — I10 Essential (primary) hypertension: Secondary | ICD-10-CM | POA: Diagnosis not present

## 2023-12-19 DIAGNOSIS — I2510 Atherosclerotic heart disease of native coronary artery without angina pectoris: Secondary | ICD-10-CM | POA: Diagnosis not present

## 2023-12-19 DIAGNOSIS — Z6841 Body Mass Index (BMI) 40.0 and over, adult: Secondary | ICD-10-CM | POA: Diagnosis not present

## 2023-12-19 DIAGNOSIS — E66813 Obesity, class 3: Secondary | ICD-10-CM | POA: Diagnosis not present

## 2023-12-20 DIAGNOSIS — M25562 Pain in left knee: Secondary | ICD-10-CM | POA: Diagnosis not present

## 2023-12-20 DIAGNOSIS — M25561 Pain in right knee: Secondary | ICD-10-CM | POA: Diagnosis not present

## 2023-12-22 DIAGNOSIS — M25562 Pain in left knee: Secondary | ICD-10-CM | POA: Diagnosis not present

## 2023-12-22 DIAGNOSIS — M25561 Pain in right knee: Secondary | ICD-10-CM | POA: Diagnosis not present

## 2023-12-27 DIAGNOSIS — F410 Panic disorder [episodic paroxysmal anxiety] without agoraphobia: Secondary | ICD-10-CM | POA: Diagnosis not present

## 2023-12-27 DIAGNOSIS — F3132 Bipolar disorder, current episode depressed, moderate: Secondary | ICD-10-CM | POA: Diagnosis not present

## 2023-12-27 DIAGNOSIS — M25562 Pain in left knee: Secondary | ICD-10-CM | POA: Diagnosis not present

## 2023-12-27 DIAGNOSIS — M25561 Pain in right knee: Secondary | ICD-10-CM | POA: Diagnosis not present

## 2023-12-30 DIAGNOSIS — H9313 Tinnitus, bilateral: Secondary | ICD-10-CM | POA: Diagnosis not present

## 2024-01-03 DIAGNOSIS — M25561 Pain in right knee: Secondary | ICD-10-CM | POA: Diagnosis not present

## 2024-01-03 DIAGNOSIS — M25562 Pain in left knee: Secondary | ICD-10-CM | POA: Diagnosis not present

## 2024-01-20 DIAGNOSIS — F410 Panic disorder [episodic paroxysmal anxiety] without agoraphobia: Secondary | ICD-10-CM | POA: Diagnosis not present

## 2024-01-20 DIAGNOSIS — F3132 Bipolar disorder, current episode depressed, moderate: Secondary | ICD-10-CM | POA: Diagnosis not present

## 2024-01-27 DIAGNOSIS — M545 Low back pain, unspecified: Secondary | ICD-10-CM | POA: Diagnosis not present

## 2024-01-27 DIAGNOSIS — Z79891 Long term (current) use of opiate analgesic: Secondary | ICD-10-CM | POA: Diagnosis not present

## 2024-01-27 DIAGNOSIS — E782 Mixed hyperlipidemia: Secondary | ICD-10-CM | POA: Diagnosis not present

## 2024-01-27 DIAGNOSIS — F329 Major depressive disorder, single episode, unspecified: Secondary | ICD-10-CM | POA: Diagnosis not present

## 2024-01-27 DIAGNOSIS — R7303 Prediabetes: Secondary | ICD-10-CM | POA: Diagnosis not present

## 2024-01-27 DIAGNOSIS — K219 Gastro-esophageal reflux disease without esophagitis: Secondary | ICD-10-CM | POA: Diagnosis not present

## 2024-01-27 DIAGNOSIS — M25562 Pain in left knee: Secondary | ICD-10-CM | POA: Diagnosis not present

## 2024-01-27 DIAGNOSIS — I10 Essential (primary) hypertension: Secondary | ICD-10-CM | POA: Diagnosis not present

## 2024-01-27 DIAGNOSIS — G894 Chronic pain syndrome: Secondary | ICD-10-CM | POA: Diagnosis not present

## 2024-01-27 DIAGNOSIS — I2510 Atherosclerotic heart disease of native coronary artery without angina pectoris: Secondary | ICD-10-CM | POA: Diagnosis not present

## 2024-01-27 DIAGNOSIS — M25561 Pain in right knee: Secondary | ICD-10-CM | POA: Diagnosis not present

## 2024-01-27 DIAGNOSIS — J4530 Mild persistent asthma, uncomplicated: Secondary | ICD-10-CM | POA: Diagnosis not present

## 2024-02-03 ENCOUNTER — Other Ambulatory Visit: Payer: Self-pay

## 2024-02-03 ENCOUNTER — Emergency Department (HOSPITAL_COMMUNITY)
Admission: EM | Admit: 2024-02-03 | Discharge: 2024-02-03 | Disposition: A | Discharge status: Home / Self Care | Attending: Emergency Medicine | Admitting: Emergency Medicine

## 2024-02-03 ENCOUNTER — Emergency Department (HOSPITAL_COMMUNITY)

## 2024-02-03 ENCOUNTER — Encounter (HOSPITAL_COMMUNITY): Payer: Self-pay

## 2024-02-03 DIAGNOSIS — R1030 Lower abdominal pain, unspecified: Secondary | ICD-10-CM | POA: Diagnosis not present

## 2024-02-03 DIAGNOSIS — S3991XA Unspecified injury of abdomen, initial encounter: Secondary | ICD-10-CM | POA: Diagnosis not present

## 2024-02-03 DIAGNOSIS — R160 Hepatomegaly, not elsewhere classified: Secondary | ICD-10-CM | POA: Insufficient documentation

## 2024-02-03 DIAGNOSIS — M542 Cervicalgia: Secondary | ICD-10-CM | POA: Diagnosis present

## 2024-02-03 DIAGNOSIS — Y92410 Unspecified street and highway as the place of occurrence of the external cause: Secondary | ICD-10-CM | POA: Diagnosis not present

## 2024-02-03 DIAGNOSIS — I700 Atherosclerosis of aorta: Secondary | ICD-10-CM | POA: Insufficient documentation

## 2024-02-03 DIAGNOSIS — S161XXA Strain of muscle, fascia and tendon at neck level, initial encounter: Secondary | ICD-10-CM | POA: Diagnosis not present

## 2024-02-03 DIAGNOSIS — S3993XA Unspecified injury of pelvis, initial encounter: Secondary | ICD-10-CM | POA: Diagnosis not present

## 2024-02-03 DIAGNOSIS — M8580 Other specified disorders of bone density and structure, unspecified site: Secondary | ICD-10-CM | POA: Diagnosis not present

## 2024-02-03 DIAGNOSIS — I2510 Atherosclerotic heart disease of native coronary artery without angina pectoris: Secondary | ICD-10-CM | POA: Diagnosis not present

## 2024-02-03 DIAGNOSIS — I10 Essential (primary) hypertension: Secondary | ICD-10-CM | POA: Insufficient documentation

## 2024-02-03 DIAGNOSIS — Z79899 Other long term (current) drug therapy: Secondary | ICD-10-CM | POA: Diagnosis not present

## 2024-02-03 DIAGNOSIS — M79631 Pain in right forearm: Secondary | ICD-10-CM | POA: Diagnosis not present

## 2024-02-03 DIAGNOSIS — R0789 Other chest pain: Secondary | ICD-10-CM | POA: Diagnosis not present

## 2024-02-03 DIAGNOSIS — S299XXA Unspecified injury of thorax, initial encounter: Secondary | ICD-10-CM | POA: Diagnosis not present

## 2024-02-03 DIAGNOSIS — S199XXA Unspecified injury of neck, initial encounter: Secondary | ICD-10-CM | POA: Diagnosis not present

## 2024-02-03 DIAGNOSIS — S20219A Contusion of unspecified front wall of thorax, initial encounter: Secondary | ICD-10-CM | POA: Diagnosis not present

## 2024-02-03 DIAGNOSIS — S3992XA Unspecified injury of lower back, initial encounter: Secondary | ICD-10-CM | POA: Diagnosis not present

## 2024-02-03 DIAGNOSIS — K760 Fatty (change of) liver, not elsewhere classified: Secondary | ICD-10-CM | POA: Insufficient documentation

## 2024-02-03 LAB — I-STAT CHEM 8, ED
BUN: <3 mg/dL — ABNORMAL LOW (ref 6–20)
Calcium, Ion: 1.22 mmol/L (ref 1.15–1.40)
Chloride: 105 mmol/L (ref 98–111)
Creatinine, Ser: 0.9 mg/dL (ref 0.44–1.00)
Glucose, Bld: 96 mg/dL (ref 70–99)
HCT: 40 % (ref 36.0–46.0)
Hemoglobin: 13.6 g/dL (ref 12.0–15.0)
Potassium: 3.8 mmol/L (ref 3.5–5.1)
Sodium: 140 mmol/L (ref 135–145)
TCO2: 25 mmol/L (ref 22–32)

## 2024-02-03 LAB — TROPONIN I (HIGH SENSITIVITY)
Troponin I (High Sensitivity): 2 ng/L (ref <18)
Troponin I (High Sensitivity): 2 ng/L (ref <18)

## 2024-02-03 MED ORDER — OXYCODONE-ACETAMINOPHEN 5-325 MG PO TABS
1.0000 | ORAL_TABLET | Freq: Once | ORAL | Status: AC
  Administered 2024-02-03: 1 via ORAL
  Filled 2024-02-03: qty 1

## 2024-02-03 MED ORDER — CYCLOBENZAPRINE HCL 10 MG PO TABS
5.0000 mg | ORAL_TABLET | Freq: Once | ORAL | Status: AC
  Administered 2024-02-03: 5 mg via ORAL
  Filled 2024-02-03: qty 1

## 2024-02-03 MED ORDER — LIDOCAINE 5 % EX PTCH: 1.0000 | MEDICATED_PATCH | CUTANEOUS | 0 refills | Status: DC

## 2024-02-03 MED ORDER — LIDOCAINE 5 % EX PTCH
1.0000 | MEDICATED_PATCH | Freq: Once | CUTANEOUS | Status: DC
  Administered 2024-02-03: 1 via TRANSDERMAL
  Filled 2024-02-03: qty 1

## 2024-02-03 MED ORDER — IOHEXOL 300 MG/ML  SOLN
100.0000 mL | Freq: Once | INTRAMUSCULAR | Status: AC | PRN
  Administered 2024-02-03: 100 mL via INTRAVENOUS

## 2024-02-03 MED ORDER — CYCLOBENZAPRINE HCL 10 MG PO TABS: 10.0000 mg | ORAL_TABLET | Freq: Two times a day (BID) | ORAL | 0 refills | Status: DC | PRN

## 2024-02-03 MED ORDER — KETOROLAC TROMETHAMINE 15 MG/ML IJ SOLN
15.0000 mg | Freq: Once | INTRAMUSCULAR | Status: AC
  Administered 2024-02-03: 15 mg via INTRAVENOUS
  Filled 2024-02-03: qty 1
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
## 2024-02-03 NOTE — ED Notes (Signed)
 Patient appears awake, alert and oriented. C-collar in placed. Patient verbalized pain still high-Provider made aware with new orders. Side rails up x 2. Call light within reach.

## 2024-02-07 DIAGNOSIS — M7918 Myalgia, other site: Secondary | ICD-10-CM | POA: Diagnosis not present

## 2024-02-07 DIAGNOSIS — M5441 Lumbago with sciatica, right side: Secondary | ICD-10-CM | POA: Diagnosis not present

## 2024-02-07 DIAGNOSIS — M542 Cervicalgia: Secondary | ICD-10-CM | POA: Diagnosis not present

## 2024-02-17 DIAGNOSIS — J300 Vasomotor rhinitis: Secondary | ICD-10-CM | POA: Diagnosis not present

## 2024-02-17 DIAGNOSIS — R052 Subacute cough: Secondary | ICD-10-CM | POA: Diagnosis not present

## 2024-02-17 DIAGNOSIS — K219 Gastro-esophageal reflux disease without esophagitis: Secondary | ICD-10-CM | POA: Diagnosis not present

## 2024-02-17 DIAGNOSIS — H1045 Other chronic allergic conjunctivitis: Secondary | ICD-10-CM | POA: Diagnosis not present

## 2024-02-23 DIAGNOSIS — F410 Panic disorder [episodic paroxysmal anxiety] without agoraphobia: Secondary | ICD-10-CM | POA: Diagnosis not present

## 2024-02-23 DIAGNOSIS — F3132 Bipolar disorder, current episode depressed, moderate: Secondary | ICD-10-CM | POA: Diagnosis not present

## 2024-03-05 ENCOUNTER — Ambulatory Visit (INDEPENDENT_AMBULATORY_CARE_PROVIDER_SITE_OTHER): Admitting: Otolaryngology

## 2024-03-09 ENCOUNTER — Other Ambulatory Visit: Payer: Self-pay | Admitting: Physician Assistant

## 2024-03-09 DIAGNOSIS — R7303 Prediabetes: Secondary | ICD-10-CM | POA: Diagnosis not present

## 2024-03-09 DIAGNOSIS — I2510 Atherosclerotic heart disease of native coronary artery without angina pectoris: Secondary | ICD-10-CM | POA: Diagnosis not present

## 2024-03-09 DIAGNOSIS — K219 Gastro-esophageal reflux disease without esophagitis: Secondary | ICD-10-CM | POA: Diagnosis not present

## 2024-03-09 DIAGNOSIS — I10 Essential (primary) hypertension: Secondary | ICD-10-CM | POA: Diagnosis not present

## 2024-03-09 DIAGNOSIS — M5441 Lumbago with sciatica, right side: Secondary | ICD-10-CM | POA: Diagnosis not present

## 2024-03-09 DIAGNOSIS — F329 Major depressive disorder, single episode, unspecified: Secondary | ICD-10-CM | POA: Diagnosis not present

## 2024-03-09 DIAGNOSIS — Z1231 Encounter for screening mammogram for malignant neoplasm of breast: Secondary | ICD-10-CM

## 2024-03-09 DIAGNOSIS — E782 Mixed hyperlipidemia: Secondary | ICD-10-CM | POA: Diagnosis not present

## 2024-03-09 DIAGNOSIS — J4530 Mild persistent asthma, uncomplicated: Secondary | ICD-10-CM | POA: Diagnosis not present

## 2024-04-10 ENCOUNTER — Ambulatory Visit (INDEPENDENT_AMBULATORY_CARE_PROVIDER_SITE_OTHER): Admitting: Otolaryngology

## 2024-04-10 DIAGNOSIS — F3132 Bipolar disorder, current episode depressed, moderate: Secondary | ICD-10-CM | POA: Diagnosis not present

## 2024-04-10 DIAGNOSIS — F410 Panic disorder [episodic paroxysmal anxiety] without agoraphobia: Secondary | ICD-10-CM | POA: Diagnosis not present

## 2024-04-19 ENCOUNTER — Other Ambulatory Visit: Payer: Self-pay

## 2024-04-19 ENCOUNTER — Inpatient Hospital Stay (HOSPITAL_COMMUNITY)

## 2024-04-19 ENCOUNTER — Encounter (HOSPITAL_COMMUNITY): Payer: Self-pay

## 2024-04-19 ENCOUNTER — Emergency Department (HOSPITAL_COMMUNITY)

## 2024-04-19 ENCOUNTER — Inpatient Hospital Stay (HOSPITAL_COMMUNITY)
Admission: EM | Admit: 2024-04-19 | Discharge: 2024-04-27 | DRG: 439 | Disposition: A | Discharge status: Home / Self Care | Attending: Internal Medicine | Admitting: Internal Medicine

## 2024-04-19 DIAGNOSIS — Z885 Allergy status to narcotic agent status: Secondary | ICD-10-CM

## 2024-04-19 DIAGNOSIS — E785 Hyperlipidemia, unspecified: Secondary | ICD-10-CM | POA: Diagnosis present

## 2024-04-19 DIAGNOSIS — Z833 Family history of diabetes mellitus: Secondary | ICD-10-CM

## 2024-04-19 DIAGNOSIS — R1032 Left lower quadrant pain: Secondary | ICD-10-CM | POA: Diagnosis not present

## 2024-04-19 DIAGNOSIS — Z818 Family history of other mental and behavioral disorders: Secondary | ICD-10-CM

## 2024-04-19 DIAGNOSIS — R0602 Shortness of breath: Secondary | ICD-10-CM | POA: Diagnosis not present

## 2024-04-19 DIAGNOSIS — R933 Abnormal findings on diagnostic imaging of other parts of digestive tract: Secondary | ICD-10-CM | POA: Diagnosis not present

## 2024-04-19 DIAGNOSIS — R079 Chest pain, unspecified: Secondary | ICD-10-CM | POA: Diagnosis not present

## 2024-04-19 DIAGNOSIS — C252 Malignant neoplasm of tail of pancreas: Secondary | ICD-10-CM | POA: Diagnosis present

## 2024-04-19 DIAGNOSIS — K8689 Other specified diseases of pancreas: Secondary | ICD-10-CM | POA: Diagnosis present

## 2024-04-19 DIAGNOSIS — Z791 Long term (current) use of non-steroidal anti-inflammatories (NSAID): Secondary | ICD-10-CM | POA: Diagnosis not present

## 2024-04-19 DIAGNOSIS — K760 Fatty (change of) liver, not elsewhere classified: Secondary | ICD-10-CM | POA: Diagnosis not present

## 2024-04-19 DIAGNOSIS — K859 Acute pancreatitis without necrosis or infection, unspecified: Secondary | ICD-10-CM

## 2024-04-19 DIAGNOSIS — Z79899 Other long term (current) drug therapy: Secondary | ICD-10-CM | POA: Diagnosis not present

## 2024-04-19 DIAGNOSIS — Z823 Family history of stroke: Secondary | ICD-10-CM | POA: Diagnosis not present

## 2024-04-19 DIAGNOSIS — F39 Unspecified mood [affective] disorder: Secondary | ICD-10-CM | POA: Diagnosis present

## 2024-04-19 DIAGNOSIS — K219 Gastro-esophageal reflux disease without esophagitis: Secondary | ICD-10-CM | POA: Diagnosis present

## 2024-04-19 DIAGNOSIS — E66813 Obesity, class 3: Secondary | ICD-10-CM | POA: Diagnosis present

## 2024-04-19 DIAGNOSIS — I2510 Atherosclerotic heart disease of native coronary artery without angina pectoris: Secondary | ICD-10-CM | POA: Diagnosis not present

## 2024-04-19 DIAGNOSIS — C787 Secondary malignant neoplasm of liver and intrahepatic bile duct: Secondary | ICD-10-CM | POA: Diagnosis not present

## 2024-04-19 DIAGNOSIS — K7689 Other specified diseases of liver: Secondary | ICD-10-CM | POA: Diagnosis present

## 2024-04-19 DIAGNOSIS — E876 Hypokalemia: Secondary | ICD-10-CM | POA: Diagnosis present

## 2024-04-19 DIAGNOSIS — K8500 Idiopathic acute pancreatitis without necrosis or infection: Secondary | ICD-10-CM | POA: Diagnosis not present

## 2024-04-19 DIAGNOSIS — Z8249 Family history of ischemic heart disease and other diseases of the circulatory system: Secondary | ICD-10-CM | POA: Diagnosis not present

## 2024-04-19 DIAGNOSIS — R7303 Prediabetes: Secondary | ICD-10-CM | POA: Diagnosis not present

## 2024-04-19 DIAGNOSIS — Z6841 Body Mass Index (BMI) 40.0 and over, adult: Secondary | ICD-10-CM

## 2024-04-19 DIAGNOSIS — R160 Hepatomegaly, not elsewhere classified: Secondary | ICD-10-CM | POA: Diagnosis not present

## 2024-04-19 DIAGNOSIS — I10 Essential (primary) hypertension: Secondary | ICD-10-CM | POA: Diagnosis not present

## 2024-04-19 DIAGNOSIS — Z880 Allergy status to penicillin: Secondary | ICD-10-CM

## 2024-04-19 DIAGNOSIS — Z96653 Presence of artificial knee joint, bilateral: Secondary | ICD-10-CM | POA: Diagnosis present

## 2024-04-19 DIAGNOSIS — R1013 Epigastric pain: Secondary | ICD-10-CM | POA: Diagnosis not present

## 2024-04-19 DIAGNOSIS — R1084 Generalized abdominal pain: Secondary | ICD-10-CM | POA: Diagnosis not present

## 2024-04-19 HISTORY — DX: Acute pancreatitis without necrosis or infection, unspecified: K85.90

## 2024-04-19 LAB — COMPREHENSIVE METABOLIC PANEL WITH GFR
ALT: 14 U/L (ref 0–44)
AST: 16 U/L (ref 15–41)
Albumin: 3.3 g/dL — ABNORMAL LOW (ref 3.5–5.0)
Alkaline Phosphatase: 95 U/L (ref 38–126)
Anion gap: 6 (ref 5–15)
BUN: 11 mg/dL (ref 6–20)
CO2: 30 mmol/L (ref 22–32)
Calcium: 9 mg/dL (ref 8.9–10.3)
Chloride: 106 mmol/L (ref 98–111)
Creatinine, Ser: 0.85 mg/dL (ref 0.44–1.00)
GFR, Estimated: >60 mL/min (ref >60)
Glucose, Bld: 136 mg/dL — ABNORMAL HIGH (ref 70–99)
Potassium: 3.6 mmol/L (ref 3.5–5.1)
Sodium: 142 mmol/L (ref 135–145)
Total Bilirubin: 0.3 mg/dL (ref 0.0–1.2)
Total Protein: 6.1 g/dL — ABNORMAL LOW (ref 6.5–8.1)

## 2024-04-19 LAB — URINALYSIS, ROUTINE W REFLEX MICROSCOPIC
Bilirubin Urine: NEGATIVE
Glucose, UA: NEGATIVE mg/dL
Hgb urine dipstick: NEGATIVE
Ketones, ur: NEGATIVE mg/dL
Nitrite: NEGATIVE
Protein, ur: NEGATIVE mg/dL
Specific Gravity, Urine: 1.008 (ref 1.005–1.030)
pH: 8 (ref 5.0–8.0)

## 2024-04-19 LAB — CBC WITH DIFFERENTIAL/PLATELET
Abs Immature Granulocytes: 0.05 10*3/uL (ref 0.00–0.07)
Basophils Absolute: 0 10*3/uL (ref 0.0–0.1)
Basophils Relative: 1 %
Eosinophils Absolute: 0.1 10*3/uL (ref 0.0–0.5)
Eosinophils Relative: 2 %
HCT: 44.2 % (ref 36.0–46.0)
Hemoglobin: 13.9 g/dL (ref 12.0–15.0)
Immature Granulocytes: 1 %
Lymphocytes Relative: 24 %
Lymphs Abs: 2.1 10*3/uL (ref 0.7–4.0)
MCH: 26.7 pg (ref 26.0–34.0)
MCHC: 31.4 g/dL (ref 30.0–36.0)
MCV: 84.8 fL (ref 80.0–100.0)
Monocytes Absolute: 0.6 10*3/uL (ref 0.1–1.0)
Monocytes Relative: 7 %
Neutro Abs: 5.7 10*3/uL (ref 1.7–7.7)
Neutrophils Relative %: 65 %
Platelets: 254 10*3/uL (ref 150–400)
RBC: 5.21 MIL/uL — ABNORMAL HIGH (ref 3.87–5.11)
RDW: 12.6 % (ref 11.5–15.5)
WBC: 8.6 10*3/uL (ref 4.0–10.5)
nRBC: 0 % (ref 0.0–0.2)

## 2024-04-19 LAB — TROPONIN I (HIGH SENSITIVITY)
Troponin I (High Sensitivity): <2 ng/L (ref <18)
Troponin I (High Sensitivity): 2 ng/L (ref <18)

## 2024-04-19 LAB — LIPASE, BLOOD: Lipase: 259 U/L — ABNORMAL HIGH (ref 11–51)

## 2024-04-19 MED ORDER — GADOBUTROL 1 MMOL/ML IV SOLN
9.0000 mL | Freq: Once | INTRAVENOUS | Status: AC | PRN
  Administered 2024-04-19: 9 mL via INTRAVENOUS

## 2024-04-19 MED ORDER — LAMOTRIGINE 100 MG PO TABS
200.0000 mg | ORAL_TABLET | Freq: Two times a day (BID) | ORAL | Status: DC
Start: 2024-04-19 — End: 2024-04-27
  Administered 2024-04-19 – 2024-04-27 (×17): 200 mg via ORAL
  Filled 2024-04-19 (×17): qty 2

## 2024-04-19 MED ORDER — HYDROMORPHONE HCL 1 MG/ML IJ SOLN
0.5000 mg | INTRAMUSCULAR | Status: DC | PRN
  Administered 2024-04-19 – 2024-04-22 (×3): 1 mg via INTRAVENOUS
  Administered 2024-04-23: 0.5 mg via INTRAVENOUS
  Administered 2024-04-23: 1 mg via INTRAVENOUS
  Filled 2024-04-19 (×5): qty 1

## 2024-04-19 MED ORDER — GABAPENTIN 100 MG PO CAPS
100.0000 mg | ORAL_CAPSULE | Freq: Three times a day (TID) | ORAL | Status: DC
  Administered 2024-04-19 – 2024-04-26 (×21): 100 mg via ORAL
  Filled 2024-04-19 (×23): qty 1

## 2024-04-19 MED ORDER — PANTOPRAZOLE SODIUM 40 MG PO TBEC
40.0000 mg | DELAYED_RELEASE_TABLET | Freq: Every day | ORAL | Status: DC
  Administered 2024-04-20 – 2024-04-27 (×8): 40 mg via ORAL
  Filled 2024-04-19 (×8): qty 1

## 2024-04-19 MED ORDER — IOHEXOL 300 MG/ML  SOLN
100.0000 mL | Freq: Once | INTRAMUSCULAR | Status: AC | PRN
  Administered 2024-04-19: 100 mL via INTRAVENOUS

## 2024-04-19 MED ORDER — ACETAMINOPHEN 650 MG RE SUPP: 650.0000 mg | Freq: Four times a day (QID) | RECTAL | Status: DC | PRN

## 2024-04-19 MED ORDER — FERROUS SULFATE 325 (65 FE) MG PO TABS
325.0000 mg | ORAL_TABLET | Freq: Every day | ORAL | Status: DC
  Administered 2024-04-20 – 2024-04-27 (×8): 325 mg via ORAL
  Filled 2024-04-19 (×9): qty 1

## 2024-04-19 MED ORDER — ACETAMINOPHEN 325 MG PO TABS
650.0000 mg | ORAL_TABLET | Freq: Four times a day (QID) | ORAL | Status: DC | PRN
  Administered 2024-04-23: 650 mg via ORAL
  Filled 2024-04-19: qty 2

## 2024-04-19 MED ORDER — QUETIAPINE FUMARATE 100 MG PO TABS
100.0000 mg | ORAL_TABLET | Freq: Every day | ORAL | Status: DC
  Administered 2024-04-19 – 2024-04-26 (×8): 100 mg via ORAL
  Filled 2024-04-19 (×8): qty 1

## 2024-04-19 MED ORDER — METOPROLOL SUCCINATE ER 50 MG PO TB24
50.0000 mg | ORAL_TABLET | Freq: Every day | ORAL | Status: DC
  Administered 2024-04-19 – 2024-04-27 (×9): 50 mg via ORAL
  Filled 2024-04-19 (×9): qty 1

## 2024-04-19 MED ORDER — HYDROMORPHONE HCL 1 MG/ML IJ SOLN
1.0000 mg | Freq: Once | INTRAMUSCULAR | Status: AC
  Administered 2024-04-19: 1 mg via INTRAVENOUS
  Filled 2024-04-19: qty 1

## 2024-04-19 MED ORDER — AMLODIPINE BESYLATE 10 MG PO TABS
10.0000 mg | ORAL_TABLET | Freq: Every day | ORAL | Status: DC
  Administered 2024-04-19 – 2024-04-27 (×9): 10 mg via ORAL
  Filled 2024-04-19 (×9): qty 1

## 2024-04-19 MED ORDER — CYCLOBENZAPRINE HCL 10 MG PO TABS
10.0000 mg | ORAL_TABLET | Freq: Two times a day (BID) | ORAL | Status: DC | PRN
  Administered 2024-04-22: 10 mg via ORAL
  Filled 2024-04-19: qty 1

## 2024-04-19 MED ORDER — LORATADINE 10 MG PO TABS
10.0000 mg | ORAL_TABLET | Freq: Every day | ORAL | Status: DC
  Administered 2024-04-19 – 2024-04-27 (×9): 10 mg via ORAL
  Filled 2024-04-19 (×9): qty 1

## 2024-04-19 MED ORDER — ONDANSETRON HCL 4 MG/2ML IJ SOLN
4.0000 mg | Freq: Once | INTRAMUSCULAR | Status: AC
  Administered 2024-04-19: 4 mg via INTRAVENOUS
  Filled 2024-04-19: qty 2

## 2024-04-19 MED ORDER — HYDROMORPHONE HCL 1 MG/ML IJ SOLN
0.5000 mg | Freq: Once | INTRAMUSCULAR | Status: AC
  Administered 2024-04-19: 0.5 mg via INTRAVENOUS
  Filled 2024-04-19: qty 1

## 2024-04-19 MED ORDER — FERROUS SULFATE 325 (65 FE) MG PO TBEC
325.0000 mg | DELAYED_RELEASE_TABLET | Freq: Every day | ORAL | Status: DC
  Filled 2024-04-19: qty 1

## 2024-04-19 MED ORDER — OXYCODONE HCL 5 MG PO TABS
5.0000 mg | ORAL_TABLET | ORAL | Status: DC | PRN
  Administered 2024-04-19 – 2024-04-25 (×11): 5 mg via ORAL
  Filled 2024-04-19 (×12): qty 1

## 2024-04-19 MED ORDER — TIZANIDINE HCL 2 MG PO TABS
1.0000 mg | ORAL_TABLET | Freq: Every day | ORAL | Status: DC
  Administered 2024-04-19 – 2024-04-26 (×8): 1 mg via ORAL
  Filled 2024-04-19 (×8): qty 1

## 2024-04-19 MED ORDER — ENOXAPARIN SODIUM 40 MG/0.4ML IJ SOSY
40.0000 mg | PREFILLED_SYRINGE | INTRAMUSCULAR | Status: DC
  Administered 2024-04-20 – 2024-04-27 (×8): 40 mg via SUBCUTANEOUS
  Filled 2024-04-19 (×8): qty 0.4

## 2024-04-19 MED ORDER — ONDANSETRON HCL 4 MG/2ML IJ SOLN
4.0000 mg | Freq: Four times a day (QID) | INTRAMUSCULAR | Status: DC | PRN
  Administered 2024-04-19 – 2024-04-23 (×2): 4 mg via INTRAVENOUS
  Filled 2024-04-19 (×2): qty 2

## 2024-04-19 MED ORDER — ALBUTEROL SULFATE (2.5 MG/3ML) 0.083% IN NEBU: 2.5000 mg | INHALATION_SOLUTION | RESPIRATORY_TRACT | Status: DC | PRN

## 2024-04-19 MED ORDER — ONDANSETRON HCL 4 MG PO TABS
4.0000 mg | ORAL_TABLET | Freq: Four times a day (QID) | ORAL | Status: DC | PRN
  Administered 2024-04-23: 4 mg via ORAL
  Filled 2024-04-19: qty 1

## 2024-04-19 MED ORDER — BUPROPION HCL ER (XL) 150 MG PO TB24
150.0000 mg | ORAL_TABLET | Freq: Every day | ORAL | Status: DC
  Administered 2024-04-19 – 2024-04-27 (×9): 150 mg via ORAL
  Filled 2024-04-19 (×9): qty 1

## 2024-04-19 MED ORDER — LACTATED RINGERS IV SOLN: INTRAVENOUS | Status: AC

## 2024-04-19 NOTE — H&P (Signed)
 History and Physical  Jo Allen FMW:[id number] DOB: 08-19-1966 DOA: 04/19/2024  PCP: [name], PA   Chief Complaint: Abdominal pain, nausea  HPI: Jo Allen is a 58 y.o. female with medical history significant for hypertension, GERD, obesity being admitted to the hospital with acute pancreatitis.  Patient states she was in her usual state of health until about 3 days ago, when she started having very epigastric abdominal pain which gradually worsened.  She had some associated nausea without vomiting, and sensation of shortness of breath due to abdominal pain.  Pain finally got so bad overnight that she decided to come to the ER this morning for evaluation.  Pain is like an epigastric burning sensation, radiating up into her chest, as well as into her back.  Has never had pain like this before, has never had pancreatitis.  Denies any new medications, she does not smoke or drink alcohol.  Review of Systems: Please see HPI for pertinent positives and negatives. A complete 10 system review of systems are otherwise negative.  Past Medical History:  Diagnosis Date   Asthma    CAD (coronary artery disease)    GERD (gastroesophageal reflux disease)    Headache    Heart disease    Hypertension    Obesity    Paresthesia 08/20/2015   Prediabetes    Transfusion history    67yrs ago   Past Surgical History:  Procedure Laterality Date   ANGIOPLASTY     BREAST BIOPSY Left    BREAST BIOPSY WITH SENTINEL LYMPH NODE BIOPSY AND NEEDLE LOCALIZATION Left    COLONOSCOPY WITH PROPOFOL  N/A 02/19/2014   Procedure: COLONOSCOPY WITH PROPOFOL ;  Surgeon: Gladis MARLA Louder, MD;  Location: [facility];  Service: Endoscopy;  Laterality: N/A;   JOINT REPLACEMENT Bilateral    LEFT HEART CATH AND CORONARY ANGIOGRAPHY N/A 04/29/2023   Procedure: LEFT HEART CATH AND CORONARY ANGIOGRAPHY;  Surgeon: Ladona Heinz, MD;  Location: [facility];  Service: Cardiovascular;  Laterality: N/A;   TOTAL KNEE  ARTHROPLASTY Bilateral    Social History:  reports that she has never smoked. She has never used smokeless tobacco. She reports that she does not drink alcohol and does not use drugs.  Allergies  Allergen Reactions   Morphine  And Codeine Nausea And Vomiting, Hives and Itching   Trazodone Nausea Only    dizziness, headache   Penicillins Hives    Has patient had a PCN reaction causing immediate rash, facial/tongue/throat swelling, SOB or lightheadedness with hypotension: Yes  Has patient had a PCN reaction causing severe rash involving mucus membranes or skin necrosis: Yes  Has patient had a PCN reaction that required hospitalization No  Has patient had a PCN reaction occurring within the last 10 years: No  If all of the above answers are NO, then may proceed with Cephalosporin use.  Has patient had a PCN reaction causing immediate rash, facial/tongue/throat swelling, SOB or lightheadedness with hypotension: Yes  Has patient had a PCN reaction causing severe rash involving mucus membranes or skin necrosis: Yes  Has patient had a PCN reaction that required hospitalization No  Has patient had a PCN reaction occurring within the last 10 years: No  If all of the above answers are NO, then may proceed with Cephalosporin use.   Tramadol      Upset stomach    Family History  Problem Relation Age of Onset   Hypertension Mother    Diabetes Mother    Stroke Father    Headache  Sister    Depression Brother    Breast cancer Neg Hx      Prior to Admission medications   Medication Sig Start Date End Date Taking? Authorizing Provider  acetaminophen  (TYLENOL  8 HOUR) 650 MG CR tablet Take 1 tablet (650 mg total) by mouth every 8 (eight) hours as needed. 12/03/19   Nanavati, Ankit, MD  albuterol  (VENTOLIN  HFA) 108 (90 Base) MCG/ACT inhaler Inhale 1-2 puffs into the lungs every 6 (six) hours as needed for wheezing or shortness of breath. 01/03/22   Billy Asberry FALCON, PA-C  amLODipine  (NORVASC )  10 MG tablet Take 1 tablet (10 mg total) by mouth daily. 04/29/23   Ladona Heinz, MD  Biotin  1000 MCG CHEW Chew 5,000 mcg by mouth daily.    [provider]  Cholecalciferol (VITAMIN D3) 50 MCG (2000 UT) capsule Take 2,000 Units by mouth daily.    [provider]  CRANBERRY PO Take 1 tablet by mouth daily.    [provider]  cyclobenzaprine  (FLEXERIL ) 10 MG tablet Take 1 tablet (10 mg total) by mouth 2 (two) times daily as needed for muscle spasms. 02/03/24   Kingsley, Victoria K, DO  doxepin  (SINEQUAN ) 25 MG capsule Take 25-50 mg by mouth at bedtime as needed (sleep aid).  11/22/19   [provider]  ferrous sulfate  325 (65 FE) MG EC tablet Take 325 mg by mouth 2 (two) times a week.    [provider]  Ginkgo 60 MG TABS Take 60 mg by mouth daily.    [provider]  Lactobacillus (PROBIOTIC ACIDOPHILUS PO) Take 1 tablet by mouth daily.    [provider]  levocetirizine (XYZAL) 5 MG tablet Take 5 mg by mouth daily.    [provider]  lidocaine  (LIDODERM ) 5 % Place 1 patch onto the skin daily. Remove & Discard patch within 12 hours or as directed by MD 02/03/24   Kingsley, Victoria K, DO  Magnesium Gluconate (MAGNESIUM 27 PO) Take by mouth.    [provider]  metoprolol  succinate (TOPROL  XL) 50 MG 24 hr tablet Take 1 tablet (50 mg total) by mouth daily. 04/29/23 10/26/23  Ladona Heinz, MD  nitroGLYCERIN  (NITROSTAT ) 0.4 MG SL tablet Place 1 tablet (0.4 mg total) under the tongue every 5 (five) minutes as needed for chest pain. If you require more than two tablets five minutes apart go to the nearest ER via EMS. 04/14/23 05/14/23  Tolia, Sunit, DO  ondansetron  (ZOFRAN -ODT) 8 MG disintegrating tablet Take 1 tablet (8 mg total) by mouth every 8 (eight) hours as needed for nausea or vomiting. 03/24/23   Randol Simmonds, MD  pantoprazole  (PROTONIX ) 20 MG tablet Take 1 tablet (20 mg total) by mouth daily. Patient taking differently: Take 20 mg  by mouth daily as needed for heartburn or indigestion. 03/24/23   Randol Simmonds, MD  pregabalin  (LYRICA ) 50 MG capsule Take 1 capsule (50 mg total) by mouth 3 (three) times daily. 07/09/21   Ines Onetha NOVAK, MD  ramelteon (ROZEREM) 8 MG tablet Take 8 mg by mouth at bedtime.    [provider]  rosuvastatin  (CRESTOR ) 20 MG tablet Take 20 mg by mouth daily. 04/07/23   [provider]  tiZANidine  (ZANAFLEX ) 2 MG tablet Take 1-2 tablets (2-4 mg total) by mouth every 6 (six) hours as needed for muscle spasms. 02/01/21   Wieters, Hallie C, PA-C  vitamin B-12 (CYANOCOBALAMIN ) 1000 MCG tablet Take 1,000 mcg by mouth daily.    [provider]  Vitamin E  180 MG (400 UNIT) CAPS Take 400 Units by mouth daily.    [provider]    Physical Exam: BP (!) 141/97 (BP Location: Right Arm)   Pulse 80   Temp 97.8 F (36.6 C) (Oral)   Resp 11   Ht 5' 4 (1.626 m)   Wt 108.9 kg   SpO2 93%   BMI 41.20 kg/m  General:  Alert, oriented, calm, in no acute distress  Eyes: EOMI, clear conjuctivae, white sclerea Neck: supple, no masses, trachea mildline  Cardiovascular: RRR, no murmurs or rubs, no peripheral edema  Respiratory: clear to auscultation bilaterally, no wheezes, no crackles  Abdomen: soft, tender, nondistended, normal bowel tones heard  Skin: dry, no rashes  Musculoskeletal: no joint effusions, normal range of motion  Psychiatric: appropriate affect, normal speech  Neurologic: extraocular muscles intact, clear speech, moving all extremities with intact sensorium         Labs on Admission:  Basic Metabolic Panel: Recent Labs  Lab 04/19/24 0549  NA 142  K 3.6  CL 106  CO2 30  GLUCOSE 136*  BUN 11  CREATININE 0.85  CALCIUM  9.0   Liver Function Tests: Recent Labs  Lab 04/19/24 0549  AST 16  ALT 14  ALKPHOS 95  BILITOT 0.3  PROT 6.1*  ALBUMIN 3.3*   Recent Labs  Lab 04/19/24 0549  LIPASE 259*   No results for input(s): AMMONIA in the last 168  hours. CBC: Recent Labs  Lab 04/19/24 0549  WBC 8.6  NEUTROABS 5.7  HGB 13.9  HCT 44.2  MCV 84.8  PLT 254   Cardiac Enzymes: No results for input(s): CKTOTAL, CKMB, CKMBINDEX, TROPONINI in the last 168 hours. BNP (last 3 results) No results for input(s): BNP in the last 8760 hours.  ProBNP (last 3 results) No results for input(s): PROBNP in the last 8760 hours.  CBG: No results for input(s): GLUCAP in the last 168 hours.  Radiological Exams on Admission: CT ABDOMEN PELVIS W CONTRAST Result Date: 04/19/2024 CLINICAL DATA:  Left lower quadrant abdominal pain. Pain and discomfort under left breast, epigastric area, and left lower back. EXAM: CT ABDOMEN AND PELVIS WITH CONTRAST TECHNIQUE: Multidetector CT imaging of the abdomen and pelvis was performed using the standard protocol following bolus administration of intravenous contrast. RADIATION DOSE REDUCTION: This exam was performed according to the departmental dose-optimization program which includes automated exposure control, adjustment of the mA and/or kV according to patient size and/or use of iterative reconstruction technique. CONTRAST:  OMNIPAQUE  IOHEXOL  300 MG/ML  SOLN COMPARISON:  02/03/2024 FINDINGS: Lower chest: Unremarkable. Hepatobiliary: No suspicious focal abnormality within the liver parenchyma. There is no evidence for gallstones, gallbladder wall thickening, or pericholecystic fluid. No intrahepatic or extrahepatic biliary dilation. Pancreas: Intra and peripancreatic edema noted in the pancreatic tail. No main duct dilatation. 14 mm ill-defined hypoattenuating focus in the tail of pancreas (24/2) may reflect focal edema. Pancreatic parenchyma enhances throughout. No focal peripancreatic fluid collection. Spleen: No splenomegaly. No suspicious focal mass lesion. 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 Adrenals/Urinary Tract: No adrenal nodule or mass. Kidneys unremarkable. No evidence for  hydroureter. Bladder is decompressed which accentuates wall thickness. Stomach/Bowel: Stomach is unremarkable. No gastric wall thickening. No evidence of outlet obstruction. Duodenum is normally positioned as is the ligament of Treitz. No small bowel wall thickening. No small bowel dilatation. The terminal ileum is normal. The appendix is not well visualized, but there is no edema or inflammation in the  region of the cecal tip to suggest appendicitis. No gross colonic mass. No colonic wall thickening. Vascular/Lymphatic: There is mild atherosclerotic calcification of the abdominal aorta without aneurysm. There is no gastrohepatic or hepatoduodenal ligament lymphadenopathy. No retroperitoneal or mesenteric lymphadenopathy. No pelvic sidewall lymphadenopathy. Reproductive: Unremarkable. Other: No intraperitoneal free fluid. Musculoskeletal: No worrisome lytic or sclerotic osseous abnormality. IMPRESSION: 1. Intra and peripancreatic edema involving the pancreatic tail with thrombosis of the splenic vein along the tail the pancreas. Imaging features compatible with acute pancreatitis. No focal peripancreatic fluid collection. 2. 14 mm ill-defined hypoattenuating focus in the tail of pancreas may reflect focal edema. Consider follow-up MRI abdomen with and without contrast to exclude underlying parenchymal lesion. 3.  Aortic Atherosclerosis (ICD10-I70.0). Electronically Signed   By: Camellia Candle M.D.   On: 04/19/2024 08:05   DG Chest Port 1 View Result Date: 04/19/2024 CLINICAL DATA:  Shortness of breath and left chest pain EXAM: PORTABLE CHEST 1 VIEW COMPARISON:  03/24/2023 FINDINGS: The lungs are clear without focal pneumonia, edema, pneumothorax or pleural effusion. Cardiopericardial silhouette is at upper limits of normal for size. No acute bony abnormality. IMPRESSION: No active disease. Electronically Signed   By: Camellia Candle M.D.   On: 04/19/2024 06:18   Assessment/Plan Jo Allen is a 58 y.o. female  with medical history significant for hypertension, GERD, obesity being admitted to the hospital with acute pancreatitis.  Acute pancreatitis-etiology is unclear, no evidence of stone, ductal dilatation, LFTs are normal.  Patient does not smoke or drink. -Inpatient admission -IV fluids -N.p.o. except for ice chips and meds -Pain and nausea control  Hypoattenuating focus in the tail of the pancreas-may be focal edema, versus mass lesion.  Discussed with patient, will obtain MRI abdomen with and without contrast for further evaluation.  Hypertension-continue home medications once reconciled, patient is unsure of the medications that she is taking  GERD-omeprazole p.o.  Morbid obesity-BMI 41.2, complicating all aspects of care  DVT prophylaxis: Lovenox     Code Status: Full Code  Consults called: None  Admission status: The appropriate patient status for this patient is INPATIENT. Inpatient status is judged to be reasonable and necessary in order to provide the required intensity of service to ensure the patient's safety. The patient's presenting symptoms, physical exam findings, and initial radiographic and laboratory data in the context of their chronic comorbidities is felt to place them at high risk for further clinical deterioration. Furthermore, it is not anticipated that the patient will be medically stable for discharge from the hospital within 2 midnights of admission.    I certify that at the point of admission it is my clinical judgment that the patient will require inpatient hospital care spanning beyond 2 midnights from the point of admission due to high intensity of service, high risk for further deterioration and high frequency of surveillance required  Time spent: 59 minutes  Venancio Chenier CHRISTELLA Gail MD [facility] Pager [phone]  If 7PM-7AM, please contact night-coverage [email or web address] Password [email or web address]  04/19/2024, 10:26 AM

## 2024-04-19 NOTE — ED Triage Notes (Signed)
 Pt states that around Sunday she started having pain discomfort under left breast, epigastric area, and left lower back. Tried home remedies like ginger and Ibuprofen  which helped a little bit but increasingly got worse past few days.  Endorses sob when taking deep breaths.

## 2024-04-19 NOTE — Plan of Care (Signed)

## 2024-04-19 NOTE — ED Provider Notes (Signed)
 See previous note for full details on HPI.  Pending CT at the time of shift change. Physical Exam  BP (!) 141/97 (BP Location: Right Arm)   Pulse 80   Temp 97.8 F (36.6 C) (Oral)   Resp 11   Ht 5' 4 (1.626 m)   Wt 108.9 kg   SpO2 93%   BMI 41.20 kg/m     Procedures  Procedures  ED Course / MDM    Medical Decision Making Amount and/or Complexity of Data Reviewed Labs: ordered. Radiology: ordered. ECG/medicine tests: ordered.  Risk Prescription drug management.   Lipase elevated.  CT with evidence of pancreatitis.  Patient denies any alcohol use.  No gallstones on CT.  Reports ongoing pain.  Last p.o. intake was yesterday morning.  Will admit for pain control.  Discussed with hospitalist will evaluate patient for admission.       Hildegard Loge, PA-C 04/19/24 1017    Long, Joshua G, MD 04/19/24 1239

## 2024-04-19 NOTE — ED Provider Notes (Signed)
 [facility]PT [facility] Emergency Department Provider Note MRN:  [id number]  Arrival date & time: 04/19/24     Chief Complaint   Abdominal Pain   History of Present Illness   Jo Allen is a 58 y.o. year-old female presents to the ED with chief complaint of epigastric abdominal pain that radiates down into the left lower quadrant.  She states that her symptoms started about 3 days ago.  She reports associated subjective fevers, but denies any chills.  She has not measured her temperature.  She denies nausea, vomiting, or diarrhea.  Denies dysuria or hematuria.  She states she has not had a bowel movement in about 3 days.  She denies any successful treatments.  Denies any other associated symptoms.  History provided by patient.   Review of Systems  Pertinent positive and negative review of systems noted in HPI.    Physical Exam   Vitals:   04/19/24 0524  BP: (!) 155/95  Pulse: 96  Resp: 20  Temp: 98.2 F (36.8 C)  SpO2: 98%    CONSTITUTIONAL:  non toxic-appearing, NAD NEURO:  Alert and oriented x 3, CN 3-12 grossly intact EYES:  eyes equal and reactive ENT/NECK:  Supple, no stridor  CARDIO:  normal rate, regular rhythm, appears well-perfused  PULM:  No respiratory distress, CTAB GI/GU:  non-distended, LLQ TTP MSK/SPINE:  No gross deformities, no edema, moves all extremities  SKIN:  no rash, atraumatic   *Additional and/or pertinent findings included in MDM below  Diagnostic and Interventional Summary    EKG Interpretation Date/Time:    Ventricular Rate:    PR Interval:    QRS Duration:    QT Interval:    QTC Calculation:   R Axis:      Text Interpretation:         Labs Reviewed  CBC WITH DIFFERENTIAL/PLATELET - Abnormal; Notable for the following components:      Result Value   RBC 5.21 (*)    All other components within normal limits  COMPREHENSIVE METABOLIC PANEL WITH GFR  LIPASE, BLOOD  URINALYSIS, ROUTINE W REFLEX MICROSCOPIC   TROPONIN I (HIGH SENSITIVITY)    DG Chest Port 1 View  Final Result    CT ABDOMEN PELVIS W CONTRAST    (Results Pending)    Medications  HYDROmorphone  (DILAUDID ) injection 0.5 mg (0.5 mg Intravenous Given 04/19/24 0606)  ondansetron  (ZOFRAN ) injection 4 mg (4 mg Intravenous Given 04/19/24 0606)     Procedures  /  Critical Care Procedures  ED Course and Medical Decision Making  I have reviewed the triage vital signs, the nursing notes, and pertinent available records from the EMR.  Social Determinants Affecting Complexity of Care: Patient has no clinically significant social determinants affecting this chief complaint..   ED Course:    Medical Decision Making Amount and/or Complexity of Data Reviewed Labs: ordered. Radiology: ordered. ECG/medicine tests: ordered.  Risk Prescription drug management.         Consultants:    Treatment and Plan: Patient here with LLQ abdominal pain and epigastric pain.    Labs and workup pending.  Signed out to [place], PA-C, who will continue care.    Final Clinical Impressions(s) / ED Diagnoses  No diagnosis found.  ED Discharge Orders     None         Discharge Instructions Discussed with and Provided to Patient:   Discharge Instructions   None      Vicky Charleston, PA-C 04/19/24 9355  Bari Charmaine FALCON, MD 04/22/24 [phone]

## 2024-04-20 DIAGNOSIS — K8500 Idiopathic acute pancreatitis without necrosis or infection: Secondary | ICD-10-CM | POA: Diagnosis not present

## 2024-04-20 DIAGNOSIS — I10 Essential (primary) hypertension: Secondary | ICD-10-CM | POA: Diagnosis present

## 2024-04-20 DIAGNOSIS — K8689 Other specified diseases of pancreas: Secondary | ICD-10-CM | POA: Diagnosis present

## 2024-04-20 DIAGNOSIS — E785 Hyperlipidemia, unspecified: Secondary | ICD-10-CM | POA: Diagnosis present

## 2024-04-20 DIAGNOSIS — K219 Gastro-esophageal reflux disease without esophagitis: Secondary | ICD-10-CM | POA: Diagnosis present

## 2024-04-20 LAB — COMPREHENSIVE METABOLIC PANEL WITH GFR
ALT: 11 U/L (ref 0–44)
AST: 14 U/L — ABNORMAL LOW (ref 15–41)
Albumin: 2.9 g/dL — ABNORMAL LOW (ref 3.5–5.0)
Alkaline Phosphatase: 85 U/L (ref 38–126)
Anion gap: 11 (ref 5–15)
BUN: 7 mg/dL (ref 6–20)
CO2: 27 mmol/L (ref 22–32)
Calcium: 8.7 mg/dL — ABNORMAL LOW (ref 8.9–10.3)
Chloride: 99 mmol/L (ref 98–111)
Creatinine, Ser: 0.77 mg/dL (ref 0.44–1.00)
GFR, Estimated: >60 mL/min (ref >60)
Glucose, Bld: 97 mg/dL (ref 70–99)
Potassium: 4 mmol/L (ref 3.5–5.1)
Sodium: 137 mmol/L (ref 135–145)
Total Bilirubin: 0.3 mg/dL (ref 0.0–1.2)
Total Protein: 5.3 g/dL — ABNORMAL LOW (ref 6.5–8.1)

## 2024-04-20 LAB — CBC
HCT: 40.9 % (ref 36.0–46.0)
Hemoglobin: 12.2 g/dL (ref 12.0–15.0)
MCH: 26.5 pg (ref 26.0–34.0)
MCHC: 29.8 g/dL — ABNORMAL LOW (ref 30.0–36.0)
MCV: 88.9 fL (ref 80.0–100.0)
Platelets: 215 K/uL (ref 150–400)
RBC: 4.6 MIL/uL (ref 3.87–5.11)
RDW: 12.8 % (ref 11.5–15.5)
WBC: 7.2 K/uL (ref 4.0–10.5)
nRBC: 0 % (ref 0.0–0.2)

## 2024-04-20 LAB — HIV ANTIBODY (ROUTINE TESTING W REFLEX): HIV Screen 4th Generation wRfx: NONREACTIVE

## 2024-04-20 MED ORDER — LACTATED RINGERS IV SOLN: INTRAVENOUS | Status: DC

## 2024-04-20 NOTE — Progress Notes (Signed)
 Progress Note   Patient: Jo Allen FMW:[id number] DOB: 29-Aug-1966 DOA: 04/19/2024     1 DOS: the patient was seen and examined on 04/20/2024   Brief hospital course: 57yo with h/o HTN, GERD, and morbid obesity who presented on 7/3 with abdominal pain.  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  Currently treating acute pancreatitis, GI consulted.  Assessment and Plan:  Acute pancreatitis Patient presented with abdominal pain and nausea No evidence of stone, ductal dilatation, LFTs are normal Admitted to med surg IV fluids N.p.o. except for ice chips and meds Pain and nausea control CT with hypoattenuating focus in the tail of the pancreas - focal edema versus mass lesion MRI with acute pancreatitis with concern for underlying adenocarcinoma with liver mets GI consulted - recommends completing treatment of pancreatitis and then repeating imaging in 6 weeks to decide if EUS is needed CA 19-9 ordered   Hypertension She does not appear to be taking medications for this issue at this time    HLD Reports no longer taking rosuvastatin   GERD Continue PPI   Morbid/class 3 obesity Body mass index is 41.2 kg/m.SABRA  Weight loss should be encouraged Outpatient PCP/bariatric medicine f/u encouraged Significantly low or high BMI is associated with higher medical risk including morbidity and mortality     Consultants: GI  Procedures: None  Antibiotics: None   30 Day Unplanned Readmission Risk Score    Flowsheet Row ED to Hosp-Admission (Current) from 04/19/2024 in [facility]  30 Day Unplanned Readmission Risk Score (%) 11.05 Filed at 04/20/2024 0801    This score is the patient's risk of an unplanned readmission within 30 days of being discharged (0 -100%). The score is based on dignosis, age, lab data,  medications, orders, and past utilization.   Low:  0-14.9   Medium: 15-21.9   High: 22-29.9   Extreme: 30 and above           Subjective: Still with LUQ pain and nausea.  LUQ pain radiates into her back, started acutely on Sunday.   Objective: Vitals:   04/20/24 0925 04/20/24 1336  BP: 122/73 (!) 143/71  Pulse:  80  Resp:  16  Temp:  99.1 F (37.3 C)  SpO2:  94%   No intake or output data in the 24 hours ending 04/20/24 1437 Filed Weights   04/19/24 0531 04/19/24 1434  Weight: 108.9 kg 108.9 kg    Exam:  General:  Appears calm and comfortable and is in NAD Eyes:   normal lids, iris ENT:  grossly normal hearing, lips & tongue, mmm Cardiovascular:  RRR. No LE edema.  Respiratory:   CTA bilaterally with no wheezes/rales/rhonchi.  Normal respiratory effort. Abdomen:  soft, marked LUQ TTP, mildly distended (vs. Habitus) Skin:  no rash or induration seen on limited exam Musculoskeletal:  grossly normal tone BUE/BLE, good ROM, no bony abnormality Psychiatric:  grossly normal mood and affect, speech fluent and appropriate, AOx3 Neurologic:  CN 2-12 grossly intact, moves all extremities in coordinated fashion  Data Reviewed: I have reviewed the patient's lab results since admission.  Pertinent labs for today include:   Unremarkable CMP Albumin 2.9 Stable CBC    Family Communication: None present; I spoke (separately) by telephone with her niece, her son, her sister, and then her Jo Allen  Disposition: Status is: Inpatient Remains inpatient appropriate because: ongoing management  Time spent: 50 minutes  Unresulted Labs (From admission, onward)     Start     Ordered   04/20/24 0833  Cancer antigen 19-9  Once,   R        04/20/24 9167             Author: Delon Herald, MD 04/20/2024 2:37 PM  For on call review [email or web address].

## 2024-04-20 NOTE — Hospital Course (Addendum)
 57yo with h/o HTN, GERD, and morbid obesity who presented on 7/3 with abdominal pain.  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  Currently treating acute pancreatitis, GI consulted.

## 2024-04-20 NOTE — Plan of Care (Signed)

## 2024-04-20 NOTE — Consult Note (Signed)
 Referring Provider: Dr. Barbarann Primary Care Physician:  Jo Allen Primary Gastroenterologist:  Sampson  Reason for Consultation: Pancreatitis  HPI: Jo Allen is a 58 y.o. female with the acute onset of severe sharp upper abdominal pain that was worse in the epigastric and LUQ and began on Monday. Nausea in ER and vomited 3 times since admit but denies N/V at home. Subjective fever and chills at home. Lipase 259, normal LFTs. Denies previous diagnosis of pancreatitis. Denies alcohol. Denies weight loss. Occasional NSAIDs. CT and MRI shows acute pancreatitis and question of hypoenhancing lesion (1.1 X 0.8 cm on MRI) in central pancreatic tail.    Past Medical History:  Diagnosis Date   Asthma    CAD (coronary artery disease)    GERD (gastroesophageal reflux disease)    Headache    Heart disease    Hypertension    Obesity    Paresthesia 08/20/2015   Prediabetes    Transfusion history    14yrs ago    Past Surgical History:  Procedure Laterality Date   ANGIOPLASTY     BREAST BIOPSY Left    BREAST BIOPSY WITH SENTINEL LYMPH NODE BIOPSY AND NEEDLE LOCALIZATION Left    COLONOSCOPY WITH PROPOFOL  N/A 02/19/2014   Procedure: COLONOSCOPY WITH PROPOFOL ;  Surgeon: Gladis MARLA Louder, MD;  Location: [facility];  Service: Endoscopy;  Laterality: N/A;   JOINT REPLACEMENT Bilateral    LEFT HEART CATH AND CORONARY ANGIOGRAPHY N/A 04/29/2023   Procedure: LEFT HEART CATH AND CORONARY ANGIOGRAPHY;  Surgeon: Ladona Heinz, MD;  Location: [facility];  Service: Cardiovascular;  Laterality: N/A;   TOTAL KNEE ARTHROPLASTY Bilateral     Prior to Admission medications   Medication Sig Start Date End Date Taking? Authorizing Provider  albuterol  (VENTOLIN  HFA) 108 (90 Base) MCG/ACT inhaler Inhale 1-2 puffs into the lungs every 6 (six) hours as needed for wheezing or shortness of breath. Patient taking differently: Inhale 2 puffs into the lungs every 6 (six) hours as needed for wheezing  or shortness of breath. 01/03/22  Yes Billy Asberry FALCON, PA-C  Biotin  1000 MCG CHEW Chew 2,000 mcg by mouth daily.   Yes [provider]  buPROPion  (WELLBUTRIN  XL) 150 MG 24 hr tablet Take 150 mg by mouth daily. 01/24/24  Yes [provider]  Cholecalciferol (VITAMIN D3) 50 MCG (2000 UT) capsule Take 4,000 Units by mouth daily.   Yes [provider]  CRANBERRY PO Take 1 tablet by mouth daily.   Yes [provider]  cyclobenzaprine  (FLEXERIL ) 10 MG tablet Take 1 tablet (10 mg total) by mouth 2 (two) times daily as needed for muscle spasms. Patient taking differently: Take 10 mg by mouth daily. 02/03/24  Yes Kingsley, Victoria K, DO  ferrous sulfate  325 (65 FE) MG EC tablet Take 325 mg by mouth daily.   Yes [provider]  gabapentin  (NEURONTIN ) 100 MG capsule Take 100 mg by mouth every 8 (eight) hours. 12/13/23  Yes [provider]  lamoTRIgine  (LAMICTAL ) 200 MG tablet Take 200 mg by mouth 2 (two) times daily.   Yes [provider]  levocetirizine (XYZAL) 5 MG tablet Take 5 mg by mouth daily.   Yes [provider]  Magnesium Gluconate (MAGNESIUM 27 PO) Take 1 tablet by mouth daily.   Yes [provider]  meloxicam (MOBIC) 15 MG tablet Take 15 mg by mouth daily.   Yes [provider]  metoprolol  succinate (TOPROL  XL) 50 MG 24 hr tablet Take 1 tablet (  50 mg total) by mouth daily. 04/29/23 04/19/24 Yes Ladona Heinz, MD  ondansetron  (ZOFRAN -ODT) 8 MG disintegrating tablet Take 1 tablet (8 mg total) by mouth every 8 (eight) hours as needed for nausea or vomiting. 03/24/23  Yes Randol Simmonds, MD  QUEtiapine  (SEROQUEL ) 100 MG tablet Take 100 mg by mouth at bedtime. 04/04/24  Yes [provider]  tiZANidine  (ZANAFLEX ) 2 MG tablet Take 1-2 tablets (2-4 mg total) by mouth every 6 (six) hours as needed for muscle spasms. Patient taking differently: Take 1 mg by mouth at bedtime. 02/01/21  Yes Wieters, Hallie C, PA-C  vitamin B-12  (CYANOCOBALAMIN ) 1000 MCG tablet Take 1,000 mcg by mouth daily.   Yes [provider]  zaleplon (SONATA) 5 MG capsule Take 5 mg by mouth at bedtime. 04/04/24  Yes [provider]  amLODipine  (NORVASC ) 10 MG tablet Take 1 tablet (10 mg total) by mouth daily. Patient not taking: Reported on 04/19/2024 04/29/23   Ladona Heinz, MD  hydrOXYzine (ATARAX) 50 MG tablet Take 50 mg by mouth at bedtime. Patient not taking: Reported on 04/19/2024 02/29/24   [provider]  nitroGLYCERIN  (NITROSTAT ) 0.4 MG SL tablet Place 1 tablet (0.4 mg total) under the tongue every 5 (five) minutes as needed for chest pain. If you require more than two tablets five minutes apart go to the nearest ER via EMS. Patient not taking: Reported on 04/19/2024 04/14/23 05/14/23  Tolia, Sunit, DO  ramelteon (ROZEREM) 8 MG tablet Take 8 mg by mouth at bedtime. Patient not taking: Reported on 04/19/2024    [provider]  rosuvastatin  (CRESTOR ) 20 MG tablet Take 20 mg by mouth daily. Patient not taking: Reported on 04/19/2024 04/07/23   [provider]    Scheduled Meds:  amLODipine   10 mg Oral Daily   buPROPion   150 mg Oral Daily   enoxaparin  (LOVENOX ) injection  40 mg Subcutaneous Q24H   ferrous sulfate   325 mg Oral Q breakfast   gabapentin   100 mg Oral Q8H   lamoTRIgine   200 mg Oral BID   loratadine   10 mg Oral Daily   metoprolol  succinate  50 mg Oral Daily   pantoprazole   40 mg Oral Daily   QUEtiapine   100 mg Oral QHS   tiZANidine   1 mg Oral QHS   Continuous Infusions:  lactated ringers  150 mL/hr at 04/20/24 1106   PRN Meds:.acetaminophen  **OR** acetaminophen , albuterol , cyclobenzaprine , HYDROmorphone  (DILAUDID ) injection, ondansetron  **OR** ondansetron  (ZOFRAN ) IV, oxyCODONE   Allergies as of 04/19/2024 - Review Complete 04/19/2024  Allergen Reaction Noted   Morphine  and codeine Nausea And Vomiting, Hives, and Itching 11/06/2013   Trazodone Nausea Only 04/14/2023   Penicillins Hives  06/04/2013   Tramadol   02/27/2016    Family History  Problem Relation Age of Onset   Hypertension Mother    Diabetes Mother    Stroke Father    Headache Sister    Depression Brother    Breast cancer Neg Hx     Social History   Socioeconomic History   Marital status: Single    Spouse name: Not on file   Number of children: Not on file   Years of education: Not on file   Highest education level: Not on file  Occupational History   Not on file  Tobacco Use   Smoking status: Never   Smokeless tobacco: Never  Vaping Use   Vaping status: Never Used  Substance and Sexual Activity   Alcohol use: No   Drug use: No  Sexual activity: Not Currently  Other Topics Concern   Not on file  Social History Narrative   Not on file   Social Drivers of Health   Financial Resource Strain: Low Risk  (12/19/2023)   Received from [facility]   Overall Financial Resource Strain (CARDIA)    Difficulty of Paying Living Expenses: Not hard at all  Food Insecurity: No Food Insecurity (04/19/2024)   Hunger Vital Sign    Worried About Running Out of Food in the Last Year: Never true    Ran Out of Food in the Last Year: Never true  Transportation Needs: No Transportation Needs (04/19/2024)   PRAPARE - Administrator, Civil Service (Medical): No    Lack of Transportation (Non-Medical): No  Physical Activity: Unknown (11/02/2022)   Received from [facility]   Exercise Vital Sign    On average, how many days per week do you engage in moderate to strenuous exercise (like a brisk walk)?: Patient declined    On average, how many minutes do you engage in exercise at this level?: 0 min  Stress: Stress Concern Present (11/02/2022)   Received from [facility] of Occupational [facility] - Occupational Stress Questionnaire    Feeling of Stress : To some extent  Social Connections: Socially Integrated (11/02/2022)   Received from [facility]   Social Network    How would  you rate your social network (family, work, friends)?: Good participation with social networks  Intimate Partner Violence: Not At Risk (04/19/2024)   Humiliation, Afraid, Rape, and Kick questionnaire    Fear of Current or Ex-Partner: No    Emotionally Abused: No    Physically Abused: No    Sexually Abused: No    Review of Systems: All negative except as stated above in HPI.  Physical Exam: Vital signs: Vitals:   04/20/24 0421 04/20/24 0925  BP: (!) 99/58 122/73  Pulse: 85   Resp: 18   Temp: 99.8 F (37.7 C)   SpO2: 93%    Last BM Date : 04/16/24 General:  Lethargic, obese, pleasant and cooperative in NAD Head: normocephalic, atraumatic Eyes: anicteric sclera ENT: oropharynx clear Neck: supple, nontender Lungs:  Clear throughout to auscultation.   No wheezes, crackles, or rhonchi. No acute distress. Heart:  Regular rate and rhythm; no murmurs, clicks, rubs,  or gallops. Abdomen: diffuse tenderness with guarding, soft, nondistended, +BS  Rectal:  Deferred Ext: no edema  GI:  Lab Results: Recent Labs    04/19/24 0549 04/20/24 0419  WBC 8.6 7.2  HGB 13.9 12.2  HCT 44.2 40.9  PLT 254 215   BMET Recent Labs    04/19/24 0549 04/20/24 0419  NA 142 137  K 3.6 4.0  CL 106 99  CO2 30 27  GLUCOSE 136* 97  BUN 11 7  CREATININE 0.85 0.77  CALCIUM  9.0 8.7*   LFT Recent Labs    04/20/24 0419  PROT 5.3*  ALBUMIN 2.9*  AST 14*  ALT 11  ALKPHOS 85  BILITOT 0.3   PT/INR No results for input(s): LABPROT, INR in the last 72 hours.   Studies/Results:  Impression/Plan: Acute pancreatitis with question of pancreatic lesion in pancreatic tail. Inflammation of the pancreas makes it difficult to clearly identify lesions in pancreas. After resolution of the pancreatitis will need a repeat MRI in about 6 weeks to re-evaluate the pancreatic parenchyma and if the lesion is seen at that time will need an EUS. IVFs. Supportive care.  Continue ice chips today. Clear liquid  diet tomorrow. Patient reports no transportation to get to her doctor appointments when she is discharged.     LOS: 1 day   [name] Sol  04/20/2024, 12:41 PM  Questions please call [phone]

## 2024-04-21 DIAGNOSIS — K8500 Idiopathic acute pancreatitis without necrosis or infection: Secondary | ICD-10-CM | POA: Diagnosis not present

## 2024-04-21 LAB — CBC WITH DIFFERENTIAL/PLATELET
Abs Immature Granulocytes: 0.01 K/uL (ref 0.00–0.07)
Basophils Absolute: 0 K/uL (ref 0.0–0.1)
Basophils Relative: 0 %
Eosinophils Absolute: 0.2 K/uL (ref 0.0–0.5)
Eosinophils Relative: 3 %
HCT: 38.4 % (ref 36.0–46.0)
Hemoglobin: 11.9 g/dL — ABNORMAL LOW (ref 12.0–15.0)
Immature Granulocytes: 0 %
Lymphocytes Relative: 39 %
Lymphs Abs: 2.2 K/uL (ref 0.7–4.0)
MCH: 27.1 pg (ref 26.0–34.0)
MCHC: 31 g/dL (ref 30.0–36.0)
MCV: 87.5 fL (ref 80.0–100.0)
Monocytes Absolute: 0.4 K/uL (ref 0.1–1.0)
Monocytes Relative: 7 %
Neutro Abs: 2.8 K/uL (ref 1.7–7.7)
Neutrophils Relative %: 51 %
Platelets: 211 K/uL (ref 150–400)
RBC: 4.39 MIL/uL (ref 3.87–5.11)
RDW: 12.2 % (ref 11.5–15.5)
WBC: 5.6 K/uL (ref 4.0–10.5)
nRBC: 0 % (ref 0.0–0.2)

## 2024-04-21 LAB — COMPREHENSIVE METABOLIC PANEL WITH GFR
ALT: 11 U/L (ref 0–44)
AST: 15 U/L (ref 15–41)
Albumin: 2.7 g/dL — ABNORMAL LOW (ref 3.5–5.0)
Alkaline Phosphatase: 81 U/L (ref 38–126)
Anion gap: 11 (ref 5–15)
BUN: 7 mg/dL (ref 6–20)
CO2: 27 mmol/L (ref 22–32)
Calcium: 8.7 mg/dL — ABNORMAL LOW (ref 8.9–10.3)
Chloride: 101 mmol/L (ref 98–111)
Creatinine, Ser: 0.91 mg/dL (ref 0.44–1.00)
GFR, Estimated: >60 mL/min (ref >60)
Glucose, Bld: 83 mg/dL (ref 70–99)
Potassium: 3.5 mmol/L (ref 3.5–5.1)
Sodium: 139 mmol/L (ref 135–145)
Total Bilirubin: 0.6 mg/dL (ref 0.0–1.2)
Total Protein: 5.1 g/dL — ABNORMAL LOW (ref 6.5–8.1)

## 2024-04-21 NOTE — Progress Notes (Signed)
 Progress Note   Patient: Jo Allen FMW:[id number] DOB: 03-Nov-1965 DOA: 04/19/2024     2 DOS: the patient was seen and examined on 04/21/2024   Brief hospital course: 57yo with h/o HTN, GERD, and morbid obesity who presented on 7/3 with abdominal pain.  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  Currently treating acute pancreatitis, GI consulted.  Assessment and Plan:  Acute pancreatitis Patient presented with abdominal pain and nausea No evidence of stone, ductal dilatation, LFTs are normal Admitted to med surg IV fluids N.p.o. except for ice chips and meds - changed to CLD this AM by GI but quickly developed nausea so will revert back Pain and nausea control CT with hypoattenuating focus in the tail of the pancreas - focal edema versus mass lesion MRI with acute pancreatitis with concern for underlying adenocarcinoma with liver mets GI consulted - recommends completing treatment of pancreatitis and then repeating imaging in 6 weeks to decide if EUS is needed CA 19-9 pending   Hypertension She does not appear to be taking medications for this issue at this time  BP 138/78, range 116/68-149/88   HLD Reports no longer taking rosuvastatin    GERD Continue PPI   Morbid/class 3 obesity Body mass index is 41.2 kg/m.SABRA  Weight loss should be encouraged Outpatient PCP/bariatric medicine f/u encouraged Significantly low or high BMI is associated with higher medical risk including morbidity and mortality        Consultants: GI   Procedures: None   Antibiotics: None     30 Day Unplanned Readmission Risk Score    Flowsheet Row ED to Hosp-Admission (Current) from 04/19/2024 in [facility]  30 Day Unplanned Readmission Risk Score (%) 11.35 Filed at 04/21/2024 0800    This score is the patient's risk of an  unplanned readmission within 30 days of being discharged (0 -100%). The score is based on dignosis, age, lab data, medications, orders, and past utilization.   Low:  0-14.9   Medium: 15-21.9   High: 22-29.9   Extreme: 30 and above           Subjective: Feeling some better this AM.  Understands that the plan is to repeat imaging in 4-6 weeks and proceed with EUS at that time if needed.   Objective: Vitals:   04/21/24 0403 04/21/24 0926  BP: 116/68 138/78  Pulse: 82 79  Resp: 17   Temp: 98.4 F (36.9 C)   SpO2: 92%     Intake/Output Summary (Last 24 hours) at 04/21/2024 1114 Last data filed at 04/20/2024 1655 Gross per 24 hour  Intake 872.5 ml  Output --  Net 872.5 ml   Filed Weights   04/19/24 0531 04/19/24 1434  Weight: 108.9 kg 108.9 kg    Exam:  General:  Appears calm and comfortable and is in NAD Eyes:   normal lids, iris ENT:  grossly normal hearing, lips & tongue, mmm Cardiovascular:  RRR. No LE edema.  Respiratory:   CTA bilaterally with no wheezes/rales/rhonchi.  Normal respiratory effort. Abdomen:  soft, scant LUQ TTP, mildly distended (vs. Habitus) Skin:  no rash or induration seen on limited exam Musculoskeletal:  grossly normal tone BUE/BLE, good ROM, no bony abnormality Psychiatric:  grossly normal mood and affect, speech fluent and appropriate, AOx3 Neurologic:  CN 2-12 grossly intact, moves all extremities in coordinated fashion  Data Reviewed: I have reviewed the  patient's lab results since admission.  Pertinent labs for today include:   Unremarkable CMP Albumin 2.7 Stable CBC     Family Communication: None present; face time with niece and sister  Disposition: Status is: Inpatient Remains inpatient appropriate because: ongoing monitoring     Time spent: 50 minutes  Unresulted Labs (From admission, onward)     Start     Ordered   04/20/24 0833  Cancer antigen 19-9  Once,   R        04/20/24 9167             Author: Delon Herald, MD 04/21/2024 11:14 AM  For on call review [email or web address].

## 2024-04-21 NOTE — Progress Notes (Signed)
 [facility] Gastroenterology Progress Note  Jo Allen 58 y.o. Feb 29, 1966   Subjective: Abdominal pain has improved.  Denies nausea vomiting.  Tolerating ice chips.  Objective: Vital signs: Vitals:   04/21/24 0403 04/21/24 0926  BP: 116/68 138/78  Pulse: 82 79  Resp: 17   Temp: 98.4 F (36.9 C)   SpO2: 92%     Physical Exam: Gen: alert, no acute distress, obese, pleasant HEENT: anicteric sclera CV: RRR Chest: CTA B Abd: Less tender in upper quadrants with guarding, soft, nondistended, positive bowel sounds Ext: no edema  Lab Results: Recent Labs    04/19/24 0549 04/20/24 0419  NA 142 137  K 3.6 4.0  CL 106 99  CO2 30 27  GLUCOSE 136* 97  BUN 11 7  CREATININE 0.85 0.77  CALCIUM  9.0 8.7*   Recent Labs    04/19/24 0549 04/20/24 0419  AST 16 14*  ALT 14 11  ALKPHOS 95 85  BILITOT 0.3 0.3  PROT 6.1* 5.3*  ALBUMIN 3.3* 2.9*   Recent Labs    04/19/24 0549 04/20/24 0419 04/21/24 0913  WBC 8.6 7.2 5.6  NEUTROABS 5.7  --  2.8  HGB 13.9 12.2 11.9*  HCT 44.2 40.9 38.4  MCV 84.8 88.9 87.5  PLT 254 215 211      Assessment/Plan: Acute pancreatitis with question of pancreatic lesion and pancreatic tail - clinically improving.  Changed diet to clear liquids and will advance slowly if tolerates.  Continue supportive care.  Will need outpatient follow-up imaging after resolution of pancreatitis to see if a lesion is indeed present.  Will follow-up.   Jo Allen 04/21/2024, 9:37 AM  Questions please call [phone] ID: Jo Allen, female   DOB: [birth year], 58 y.o.   MRN: [id number]

## 2024-04-21 NOTE — TOC Initial Note (Signed)
 Transition of Care [facility]) - Initial/Assessment Note    Patient Details  Name: Jo Allen MRN: [id number] Date of Birth: [birth year]  [facility] CM/SW Contact:    Sonda Manuella Quill, RN Phone Number: 04/21/2024, 4:04 PM  Clinical Narrative:                 Beatris w/ pt in room; pt says she shares apartment w/ her son Criss Bartles [phone]); she verified insurance/PCP; pt says she is not sure if her son can provide transportation because he works out of town; she also says she does not have money to pay for transportation; she denied SDOH risks; pt says she does not have DME, HH services, or home oxygen; TOC will follow.  Expected Discharge Plan: Home/Self Care Barriers to Discharge: Continued Medical Work up   Patient Goals and CMS Choice Patient states their goals for this hospitalization and ongoing recovery are:: home          Expected Discharge Plan and Services   Discharge Planning Services: CM Consult   Living arrangements for the past 2 months: Apartment                                      Prior Living Arrangements/Services Living arrangements for the past 2 months: Apartment Lives with:: Adult Children Patient language and need for interpreter reviewed:: Yes Do you feel safe going back to the place where you live?: Yes      Need for Family Participation in Patient Care: Yes (Comment) Care giver support system in place?: Yes (comment)   Criminal Activity/Legal Involvement Pertinent to Current Situation/Hospitalization: No - Comment as needed  Activities of Daily Living   ADL Screening (condition at time of admission) Independently performs ADLs?: Yes (appropriate for developmental age) Is the patient deaf or have difficulty hearing?: No Does the patient have difficulty seeing, even when wearing glasses/contacts?: No Does the patient have difficulty concentrating, remembering, or making decisions?: No  Permission  Sought/Granted Permission sought to share information with : Case Manager Permission granted to share information with : Yes, Verbal Permission Granted  Share Information with NAME: Case Manager     Permission granted to share info w Relationship: Karima Carrell (son) [phone]     Emotional Assessment Appearance:: Appears stated age Attitude/Demeanor/Rapport: Gracious Affect (typically observed): Accepting Orientation: : Oriented to Self, Oriented to Place, Oriented to  Time, Oriented to Situation Alcohol / Substance Use: Not Applicable Psych Involvement: No (comment)  Admission diagnosis:  Acute pancreatitis [K85.90] Patient Active Problem List   Diagnosis Date Noted   Pancreatic mass 04/20/2024   Essential hypertension 04/20/2024   Dyslipidemia 04/20/2024   GERD (gastroesophageal reflux disease) 04/20/2024   Morbid obesity with BMI of 40.0-44.9, adult (HCC) 04/20/2024   Acute pancreatitis 04/19/2024   Angina pectoris (HCC) 04/29/2023   Hematoma of right forearm 04/29/2023   Numbness and tingling in left hand 07/09/2021   Chronic back pain 07/09/2021   Bilateral headaches 07/09/2021   Trigeminal neuralgia of left side of face 07/09/2021   Other chronic pain 07/09/2021   Paresthesia 08/20/2015   PCP:  [name], PA Pharmacy:   [id number] [place], [place] - [address] AT [facility] & [address] [place] [address] Phone: [phone] Fax: [phone]     Social Drivers of Health (SDOH) Social  History: SDOH Screenings   Food Insecurity: No Food Insecurity (04/21/2024)  Housing: Low Risk  (04/21/2024)  Transportation Needs: No Transportation Needs (04/21/2024)  Utilities: Not At Risk (04/21/2024)  Financial Resource Strain: Low Risk  (12/19/2023)   Received from [facility]  Physical Activity: Unknown (11/02/2022)   Received from [facility]  Social Connections: Socially Integrated (11/02/2022)    Received from [facility]  Stress: Stress Concern Present (11/02/2022)   Received from [facility]  Tobacco Use: Low Risk  (04/19/2024)   SDOH Interventions: Food Insecurity Interventions: Intervention Not Indicated, Inpatient TOC Housing Interventions: Intervention Not Indicated, Inpatient TOC Transportation Interventions: Intervention Not Indicated, Inpatient TOC Utilities Interventions: Intervention Not Indicated, Inpatient TOC   Readmission Risk Interventions     No data to display

## 2024-04-22 DIAGNOSIS — K8500 Idiopathic acute pancreatitis without necrosis or infection: Secondary | ICD-10-CM | POA: Diagnosis not present

## 2024-04-22 LAB — CANCER ANTIGEN 19-9: CA 19-9: <2 U/mL (ref 0–35)

## 2024-04-22 NOTE — Plan of Care (Signed)
  Problem: Elimination: Goal: Will not experience complications related to urinary retention Outcome: Progressing   Problem: Pain Managment: Goal: General experience of comfort will improve and/or be controlled Outcome: Progressing   Problem: Safety: Goal: Ability to remain free from injury will improve Outcome: Progressing   Problem: Skin Integrity: Goal: Risk for impaired skin integrity will decrease Outcome: Progressing

## 2024-04-22 NOTE — Plan of Care (Signed)
  Problem: Clinical Measurements: Goal: Diagnostic test results will improve Outcome: Progressing Goal: Respiratory complications will improve Outcome: Progressing Goal: Cardiovascular complication will be avoided Outcome: Progressing   Problem: Pain Managment: Goal: General experience of comfort will improve and/or be controlled Outcome: Progressing

## 2024-04-22 NOTE — Progress Notes (Signed)
 [facility] Gastroenterology Progress Note  Jo Allen 58 y.o. 1966-08-13   Subjective: Vomited yesterday after the apple juice.  Denies nausea or vomiting during my evaluation tolerating ice chips.  Left upper quadrant abdominal pain.  Excited about being able to try clear liquids again.  Family in room.  Objective: Vital signs: Vitals:   04/22/24 0543 04/22/24 1203  BP: 136/77 133/66  Pulse: 81 83  Resp: 16 18  Temp: 97.8 F (36.6 C) 98.4 F (36.9 C)  SpO2: 100% 98%    Physical Exam: Gen: alert, no acute distress, obese, pleasant HEENT: anicteric sclera CV: RRR Chest: CTA B Abd: Epigastric and left upper quadrant tenderness with guarding, nondistended, soft, positive bowel sounds Ext: no edema  Lab Results: Recent Labs    04/20/24 0419 04/21/24 0913  NA 137 139  K 4.0 3.5  CL 99 101  CO2 27 27  GLUCOSE 97 83  BUN 7 7  CREATININE 0.77 0.91  CALCIUM  8.7* 8.7*   Recent Labs    04/20/24 0419 04/21/24 0913  AST 14* 15  ALT 11 11  ALKPHOS 85 81  BILITOT 0.3 0.6  PROT 5.3* 5.1*  ALBUMIN 2.9* 2.7*   Recent Labs    04/20/24 0419 04/21/24 0913  WBC 7.2 5.6  NEUTROABS  --  2.8  HGB 12.2 11.9*  HCT 40.9 38.4  MCV 88.9 87.5  PLT 215 211      Assessment/Plan: Acute pancreatitis with question of pancreatic lesion in pancreatic tail - with retrial of clear liquid diet today and if tolerates advance diet slowly tomorrow.  Continue supportive care.  Would not recommend EUS or repeat MRI at this time to evaluate the pancreatic lesion until after resolution of her pancreatitis.  LFTs are normal and I doubt there is any biliary obstruction.  Dr. Rosalie will follow-up tomorrow.   Jo Allen 04/22/2024, 3:38 PM  Questions please call [phone] ID: Jo Allen, female   DOB: [birth year], 58 y.o.   MRN: [id number]

## 2024-04-22 NOTE — Progress Notes (Signed)
 Progress Note   Patient: Jo Allen FMW:[id number] DOB: 07-28-1966 DOA: 04/19/2024     3 DOS: the patient was seen and examined on 04/22/2024   Brief hospital course: 58yo with h/o HTN, GERD, and morbid obesity who presented on 7/3 with abdominal pain.  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  Currently treating acute pancreatitis, GI consulted.  Assessment and Plan:  Acute pancreatitis Patient presented with abdominal pain and nausea No evidence of stone, ductal dilatation, LFTs are normal Admitted to med surg IV fluids N.p.o. except for ice chips and meds - changed to CLD this AM by GI but quickly developed nausea so will revert back Pain and nausea control CT with hypoattenuating focus in the tail of the pancreas - focal edema versus mass lesion MRI with acute pancreatitis with concern for underlying adenocarcinoma with liver mets GI consulted - recommends completing treatment of pancreatitis and then repeating imaging in 6 weeks to decide if EUS is needed CA 19-9 is <2, which is reassuring She is not able to tolerate PO and continues to have generally unchanged symptoms; she may benefit from ERCP/EUS now to further assess but will defer to GI   Hypertension She does not appear to be taking medications for this issue at this time  BP 133/66, range 120/69-150/79   HLD Reports no longer taking rosuvastatin    GERD Continue PPI   Morbid/class 3 obesity Body mass index is 41.2 kg/m.SABRA  Weight loss should be encouraged Outpatient PCP/bariatric medicine f/u encouraged Significantly low or high BMI is associated with higher medical risk including morbidity and mortality        Consultants: GI   Procedures: None   Antibiotics: None    30 Day Unplanned Readmission Risk Score    Flowsheet Row ED to Hosp-Admission (Current) from 04/19/2024 in  [facility]  30 Day Unplanned Readmission Risk Score (%) 12.69 Filed at 04/22/2024 0801    This score is the patient's risk of an unplanned readmission within 30 days of being discharged (0 -100%). The score is based on dignosis, age, lab data, medications, orders, and past utilization.   Low:  0-14.9   Medium: 15-21.9   High: 22-29.9   Extreme: 30 and above           Subjective: Continues to have LUQ pain with radiation to the back.  She was up frequently during the night due to voiding (IVF)   Objective: Vitals:   04/22/24 0543 04/22/24 1203  BP: 136/77 133/66  Pulse: 81 83  Resp: 16 18  Temp: 97.8 F (36.6 C) 98.4 F (36.9 C)  SpO2: 100% 98%    Intake/Output Summary (Last 24 hours) at 04/22/2024 1338 Last data filed at 04/22/2024 0200 Gross per 24 hour  Intake 4140.06 ml  Output --  Net 4140.06 ml   Filed Weights   04/19/24 0531 04/19/24 1434  Weight: 108.9 kg 108.9 kg    Exam:  General:  Appears calm and comfortable and is in NAD Eyes:   normal lids, iris ENT:  grossly normal hearing, lips & tongue, mmm Cardiovascular:  RRR. No LE edema.  Respiratory:   CTA bilaterally with no wheezes/rales/rhonchi.  Normal respiratory effort. Abdomen:  soft, + LUQ TTP, mildly distended (vs. Habitus) Skin:  no rash or induration seen on limited exam Musculoskeletal:  grossly normal tone BUE/BLE, good ROM, no bony abnormality Psychiatric:  grossly normal mood and affect, speech fluent and appropriate, AOx3 Neurologic:  CN 2-12 grossly intact, moves all extremities in coordinated fashion  Data Reviewed: I have reviewed the patient's lab results since admission.  Pertinent labs for today include:   Unremarkable CMP Albumin 2.7 WBC 5.6     Family Communication: None today  Disposition: Status is: Inpatient Remains inpatient appropriate because: ongoing monitoring     Time spent: 50 minutes  Unresulted Labs (From admission,  onward)    None        Author: Delon Herald, MD 04/22/2024 1:38 PM  For on call review [email or web address].

## 2024-04-23 DIAGNOSIS — K8500 Idiopathic acute pancreatitis without necrosis or infection: Secondary | ICD-10-CM | POA: Diagnosis not present

## 2024-04-23 MED ORDER — PANCRELIPASE (LIP-PROT-AMYL) 36000-114000 UNITS PO CPEP
36000.0000 [IU] | ORAL_CAPSULE | Freq: Three times a day (TID) | ORAL | Status: DC
  Administered 2024-04-23 – 2024-04-27 (×11): 36000 [IU] via ORAL
  Filled 2024-04-23 (×11): qty 1

## 2024-04-23 NOTE — Progress Notes (Signed)
 Jo Allen 12:10 PM  Subjective: Patient seen and examined and her case discussed with my partner Dr. Dianna and her hospital computer chart reviewed and she still has left-sided pain and a little nausea but is tolerating clear liquids and has no new complaints and is passing gas but has not had a bowel movement but is walking in the halls  Objective: Vital signs stable afebrile no acute distress abdomen is soft nontender in the upper quadrants no new labs x-rays reviewed  Assessment: Pancreatitis questionable etiology possibly small tumor  Plan: Will need a repeat MRI versus EUS in 6 weeks and in the meantime we will try soft solids and add pancreatic enzymes we will check on tomorrow  [facility]  office [phone] After 5PM or if no answer call [phone]

## 2024-04-23 NOTE — Progress Notes (Signed)
 Progress Note   Patient: Jo Allen FMW:[id number] DOB: 1966-09-07 DOA: 04/19/2024     4 DOS: the patient was seen and examined on 04/23/2024   Brief hospital course: 57yo with h/o HTN, GERD, and morbid obesity who presented on 7/3 with abdominal pain.  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  Currently treating acute pancreatitis, GI consulted.  Assessment and Plan:  Acute pancreatitis Patient presented with abdominal pain and nausea No evidence of stone, ductal dilatation, LFTs are normal Admitted to med surg IV fluids N.p.o. except for ice chips and meds - changed to CLD this AM by GI but quickly developed nausea so will revert back Pain and nausea control CT with hypoattenuating focus in the tail of the pancreas - focal edema versus mass lesion MRI with acute pancreatitis with concern for underlying adenocarcinoma with liver mets GI consulted - recommends completing treatment of pancreatitis and then repeating imaging in 6 weeks to decide if EUS is needed CA 19-9 is <2, which is reassuring She is able to tolerate a small amount of CLD but is still having pain and nausea   Hypertension She does not appear to be taking medications for this issue at this time  BP 131/79, range 111/62-131/79 Controlled, will not add medications   HLD Reports no longer taking rosuvastatin    GERD Continue PPI   Morbid/class 3 obesity Body mass index is 41.2 kg/m.SABRA  Weight loss should be encouraged Outpatient PCP/bariatric medicine f/u encouraged Significantly low or high BMI is associated with higher medical risk including morbidity and mortality        Consultants: GI   Procedures: None   Antibiotics: None    30 Day Unplanned Readmission Risk Score    Flowsheet Row ED to Hosp-Admission (Current) from 04/19/2024 in [facility]  30 Day Unplanned Readmission Risk Score (%) 12.88 Filed at 04/23/2024 0801    This score is the patient's risk of an unplanned readmission within 30 days of being discharged (0 -100%). The score is based on dignosis, age, lab data, medications, orders, and past utilization.   Low:  0-14.9   Medium: 15-21.9   High: 22-29.9   Extreme: 30 and above           Subjective: Tolerating some clears, mostly ices but not other things due to nausea and epigastric burning.  Still having pain but maybe a little better than when she arrived.   Objective: Vitals:   04/23/24 0923 04/23/24 1306  BP: 111/62 131/79  Pulse: 82 92  Resp:  17  Temp:  98.2 F (36.8 C)  SpO2:  91%    Intake/Output Summary (Last 24 hours) at 04/23/2024 1510 Last data filed at 04/22/2024 1625 Gross per 24 hour  Intake 620 ml  Output --  Net 620 ml   Filed Weights   04/19/24 0531 04/19/24 1434  Weight: 108.9 kg 108.9 kg    Exam:  General:  Appears calm and comfortable and is in NAD Eyes:   normal lids, iris ENT:  grossly normal hearing, lips & tongue, mmm Cardiovascular:  RRR. No LE edema.  Respiratory:   CTA bilaterally with no wheezes/rales/rhonchi.  Normal respiratory effort. Abdomen:  soft, + LUQ TTP that is improved from admission, mildly distended (vs. Habitus) Skin:  no rash or induration seen on limited exam Musculoskeletal:  grossly normal tone BUE/BLE, good ROM, no bony  abnormality Psychiatric:  grossly normal mood and affect, speech fluent and appropriate, AOx3 Neurologic:  CN 2-12 grossly intact, moves all extremities in coordinated fashion  Data Reviewed: I have reviewed the patient's lab results since admission.  Pertinent labs for today include:   None     Family Communication: None present  Disposition: Status is: Inpatient Remains inpatient appropriate because: slow improvement     Time spent: 50 minutes  Unresulted Labs (From admission, onward)     Start      Ordered   04/24/24 0500  CBC with Differential/Platelet  Tomorrow morning,   R        04/23/24 1510   04/24/24 0500  Comprehensive metabolic panel with GFR  Tomorrow morning,   R        04/23/24 1510   04/24/24 0500  Lipase, blood  Tomorrow morning,   R        04/23/24 1510             Author: Delon Herald, MD 04/23/2024 3:10 PM  For on call review [email or web address].

## 2024-04-23 NOTE — Plan of Care (Signed)

## 2024-04-23 NOTE — Plan of Care (Signed)
   Problem: Education: Goal: Knowledge of General Education information will improve Description Including pain rating scale, medication(s)/side effects and non-pharmacologic comfort measures Outcome: Progressing   Problem: Health Behavior/Discharge Planning: Goal: Ability to manage health-related needs will improve Outcome: Progressing

## 2024-04-24 DIAGNOSIS — K8500 Idiopathic acute pancreatitis without necrosis or infection: Secondary | ICD-10-CM | POA: Diagnosis not present

## 2024-04-24 LAB — COMPREHENSIVE METABOLIC PANEL WITH GFR
ALT: 11 U/L (ref 0–44)
AST: 16 U/L (ref 15–41)
Albumin: 2.8 g/dL — ABNORMAL LOW (ref 3.5–5.0)
Alkaline Phosphatase: 87 U/L (ref 38–126)
Anion gap: 7 (ref 5–15)
BUN: 7 mg/dL (ref 6–20)
CO2: 28 mmol/L (ref 22–32)
Calcium: 9 mg/dL (ref 8.9–10.3)
Chloride: 105 mmol/L (ref 98–111)
Creatinine, Ser: 0.88 mg/dL (ref 0.44–1.00)
GFR, Estimated: >60 mL/min (ref >60)
Glucose, Bld: 126 mg/dL — ABNORMAL HIGH (ref 70–99)
Potassium: 3.3 mmol/L — ABNORMAL LOW (ref 3.5–5.1)
Sodium: 140 mmol/L (ref 135–145)
Total Bilirubin: 0.6 mg/dL (ref 0.0–1.2)
Total Protein: 5.7 g/dL — ABNORMAL LOW (ref 6.5–8.1)

## 2024-04-24 LAB — CBC WITH DIFFERENTIAL/PLATELET
Abs Immature Granulocytes: 0.01 K/uL (ref 0.00–0.07)
Basophils Absolute: 0 K/uL (ref 0.0–0.1)
Basophils Relative: 1 %
Eosinophils Absolute: 0.2 K/uL (ref 0.0–0.5)
Eosinophils Relative: 4 %
HCT: 36.6 % (ref 36.0–46.0)
Hemoglobin: 11.3 g/dL — ABNORMAL LOW (ref 12.0–15.0)
Immature Granulocytes: 0 %
Lymphocytes Relative: 39 %
Lymphs Abs: 2 K/uL (ref 0.7–4.0)
MCH: 26.7 pg (ref 26.0–34.0)
MCHC: 30.9 g/dL (ref 30.0–36.0)
MCV: 86.3 fL (ref 80.0–100.0)
Monocytes Absolute: 0.5 K/uL (ref 0.1–1.0)
Monocytes Relative: 9 %
Neutro Abs: 2.4 K/uL (ref 1.7–7.7)
Neutrophils Relative %: 47 %
Platelets: 245 K/uL (ref 150–400)
RBC: 4.24 MIL/uL (ref 3.87–5.11)
RDW: 12.2 % (ref 11.5–15.5)
WBC: 5.2 K/uL (ref 4.0–10.5)
nRBC: 0 % (ref 0.0–0.2)

## 2024-04-24 LAB — LIPASE, BLOOD: Lipase: 173 U/L — ABNORMAL HIGH (ref 11–51)

## 2024-04-24 MED ORDER — DOCUSATE SODIUM 50 MG PO CAPS
50.0000 mg | ORAL_CAPSULE | Freq: Once | ORAL | Status: AC
  Administered 2024-04-24: 50 mg via ORAL
  Filled 2024-04-24: qty 1

## 2024-04-24 MED ORDER — POTASSIUM CHLORIDE CRYS ER 20 MEQ PO TBCR
40.0000 meq | EXTENDED_RELEASE_TABLET | Freq: Once | ORAL | Status: AC
Start: 2024-04-24 — End: 2024-04-24
  Administered 2024-04-24: 40 meq via ORAL
  Filled 2024-04-24: qty 2

## 2024-04-24 NOTE — Progress Notes (Signed)
 Jo Allen 10:27 AM  Subjective: Patient did fair with eating yesterday with some feeling like she was not digesting but believes the pancreatic enzymes helped and has no new complaints she is urinating frequently and her hands are beginning to swell  Objective: Vital signs stable afebrile no acute distress abdomen is softer and less tender today chemistries okay potassium little low lipase decreased but still elevated white count okay  Assessment: We discussed the diagnosis and need for follow-up to rule out a pancreatic tumor and we answered all of her questions  Plan: Continue present management can go home when eating okay and pain controlled and then will follow-up with Dr. Burnette to determine EUS versus repeat MRI and adjust IV fluids per medical team but probably can turn down some  [facility]  office [phone] After 5PM or if no answer call [phone]

## 2024-04-24 NOTE — Progress Notes (Signed)
 Progress Note   Patient: Jo Allen FMW:[id number] DOB: Jun 17, 1966 DOA: 04/19/2024     5 DOS: the patient was seen and examined on 04/24/2024   Brief hospital course: 58yo with h/o HTN, GERD, and morbid obesity who presented on 7/3 with abdominal pain.  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  Currently treating acute pancreatitis, GI consulted.  Assessment and Plan:  Acute pancreatitis Patient presented with abdominal pain and nausea No evidence of stone, ductal dilatation, LFTs are normal Admitted to med surg IV fluids N.p.o. except for ice chips and meds - changed to CLD with stuttering start; currently able to tolerate soft diet with some persistent nausea but generally tolerated, no vomiting Pain and nausea control CT with hypoattenuating focus in the tail of the pancreas - focal edema versus mass lesion MRI with acute pancreatitis with concern for underlying adenocarcinoma with liver mets GI consulted - recommends completing treatment of pancreatitis and then repeating imaging in 6 weeks to decide if EUS is needed CA 19-9 is <2, which is reassuring Will stop IVF and IV pain medications today and see if she is able to tolerate If so, anticipate dc to home on 7/9   Hypertension She does not appear to be taking medications for this issue at this time  BP 121/71 Controlled, will not add medications   HLD Reports no longer taking rosuvastatin    GERD Continue PPI   Morbid/class 3 obesity Body mass index is 41.2 kg/m.SABRA  Weight loss should be encouraged Outpatient PCP/bariatric medicine f/u encouraged Significantly low or high BMI is associated with higher medical risk including morbidity and mortality        Consultants: GI   Procedures: None   Antibiotics: None    30 Day Unplanned Readmission Risk Score    Flowsheet Row ED to  Hosp-Admission (Current) from 04/19/2024 in [facility]  30 Day Unplanned Readmission Risk Score (%) 11.46 Filed at 04/24/2024 1200    This score is the patient's risk of an unplanned readmission within 30 days of being discharged (0 -100%). The score is based on dignosis, age, lab data, medications, orders, and past utilization.   Low:  0-14.9   Medium: 15-21.9   High: 22-29.9   Extreme: 30 and above           Subjective: Ate some sausage tonight with resultant epigastric pain/reflux/nausea but no vomiting.  Does not feel quite ready to go home yet.   Objective: Vitals:   04/24/24 0930 04/24/24 1216  BP: 123/71 121/71  Pulse: 79 82  Resp:    Temp:  97.9 F (36.6 C)  SpO2:  99%    Intake/Output Summary (Last 24 hours) at 04/24/2024 1539 Last data filed at 04/24/2024 0700 Gross per 24 hour  Intake 720 ml  Output --  Net 720 ml   Filed Weights   04/19/24 0531 04/19/24 1434  Weight: 108.9 kg 108.9 kg    Exam:  General:  Appears calm and comfortable and is in NAD Eyes:   normal lids, iris ENT:  grossly normal hearing, lips & tongue, mmm Cardiovascular:  RRR. No LE edema.  Respiratory:   CTA bilaterally with no wheezes/rales/rhonchi.  Normal respiratory effort. Abdomen:  soft, mild LUQ TTP that is clearly improving Skin:  no rash or induration seen on limited exam Musculoskeletal:  grossly normal tone BUE/BLE, good ROM, no bony abnormality  Psychiatric:  grossly normal mood and affect, speech fluent and appropriate, AOx3 Neurologic:  CN 2-12 grossly intact, moves all extremities in coordinated fashion  Data Reviewed: I have reviewed the patient's lab results since admission.  Pertinent labs for today include:   K+ 3.3, repleted Glucose 126 Albumin 2.8 Lipase 173, improved WBC 5.2 Hgb 11.3     Family Communication: None present  Disposition: Status is: Inpatient Remains inpatient appropriate because: ongoing  monitoring     Time spent: 50 minutes  Unresulted Labs (From admission, onward)    None        Author: Delon Herald, MD 04/24/2024 3:39 PM  For on call review [email or web address].

## 2024-04-25 DIAGNOSIS — K8500 Idiopathic acute pancreatitis without necrosis or infection: Secondary | ICD-10-CM | POA: Diagnosis not present

## 2024-04-25 MED ORDER — POLYETHYLENE GLYCOL 3350 17 G PO PACK
17.0000 g | PACK | Freq: Every day | ORAL | Status: DC
  Administered 2024-04-25 – 2024-04-27 (×3): 17 g via ORAL
  Filled 2024-04-25 (×3): qty 1

## 2024-04-25 MED ORDER — POTASSIUM CHLORIDE CRYS ER 20 MEQ PO TBCR
40.0000 meq | EXTENDED_RELEASE_TABLET | Freq: Once | ORAL | Status: AC
  Administered 2024-04-25: 40 meq via ORAL
  Filled 2024-04-25: qty 2

## 2024-04-25 NOTE — Progress Notes (Signed)
 Please see other note.

## 2024-04-25 NOTE — Progress Notes (Signed)
  Progress Note   Patient: Jo Allen FMW:[id number] DOB: [birth year] DOA: 04/19/2024     6 DOS: the patient was seen and examined on 04/25/2024 at 11:45 AM      Brief hospital course: 57yo with h/o HTN, GERD, and morbid obesity who presented on 7/3 with abdominal pain.  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  Currently treating acute pancreatitis, GI consulted.     Assessment and Plan: Acute pancreatitis Patient having abdominal pain after eating such as this morning. - Downgrade to full liquid diet - Monitor overnight - Plan for pancreas enzymes at discharge   Liver lesions Discussed with oncology.  PET scan unreliable in setting of pancreatitis.  Rapid diagnostic clinic would be of no benefit if patietn already established with GI for EUS.  Talked with IR, lesions not possible to biopsy (not visible on CT).  CA19-9 normal. - Outpatient EUS asap  Hypertension Hyperlipidemia BP normal - Continue amlodipine , metoprolol  - Hold Crestor   Mood disorder - Continue Bupropion , Seroquel , Lamictal   Hypokalemia Potassium 3.3 this morning. - Supplement K  Normocytic anemia Mild, no bleeding  Class III obesity Complicates care, BMI 41        Subjective: Patient with pain in LUQ and epigastrium, worse with eating, still taking oxycodone  every few hours.  No fever. No vomiting.  No confusion.  Pain with inspiration.     Physical Exam: BP (!) 147/86 (BP Location: Right Arm)   Pulse 88   Temp (!) 97.2 F (36.2 C) (Oral)   Resp 16   Ht 5' 4 (1.626 m)   Wt 108.9 kg   SpO2 98%   BMI 41.20 kg/m   Obese adult female, lying in bed, interactive and appropriate RRR, no murmurs, no peripheral edema Respiratory rate normal, lungs clear without rales or wheezes Abdomen soft, tenderness in the left upper quadrant and epigastrium, no rigidity or  rebound Attention normal, affect anxious, judgment and insight appear normal, face symmetric, speech fluent  Data Reviewed: Basic metabolic panel shows mild hypokalemia CBC shows mild anemia    Family Communication:     Disposition: Status is: Inpatient         Author: Lonni SHAUNNA Dalton, MD 04/25/2024 5:13 PM  For on call review [email or web address].

## 2024-04-25 NOTE — Plan of Care (Signed)

## 2024-04-25 NOTE — Progress Notes (Signed)
 Jo Allen 12:41 PM  Subjective: Patient doing a little better probably ready to go home soon no new complaints explained a little bit of pain particularly with coughing and deep breath on the left and we discussed outpatient follow-up  Objective: Vital signs stable afebrile no acute distress in good spirits not examined today no new labs  Assessment: Pancreatitis  Plan: We discussed follow-up in our office with Dr. Burnette in 2 to 3 weeks to discuss EUS versus repeat MRCP and please call us  sooner if we can be of any further assistance with this hospital stay and would prescribe her pancreatic enzymes to take with meals as an outpatient  [facility]  office [phone] After 5PM or if no answer call [phone]

## 2024-04-26 DIAGNOSIS — K8500 Idiopathic acute pancreatitis without necrosis or infection: Secondary | ICD-10-CM | POA: Diagnosis not present

## 2024-04-26 LAB — COMPREHENSIVE METABOLIC PANEL WITH GFR
ALT: 14 U/L (ref 0–44)
AST: 22 U/L (ref 15–41)
Albumin: 3.3 g/dL — ABNORMAL LOW (ref 3.5–5.0)
Alkaline Phosphatase: 97 U/L (ref 38–126)
Anion gap: 9 (ref 5–15)
BUN: 8 mg/dL (ref 6–20)
CO2: 25 mmol/L (ref 22–32)
Calcium: 9.1 mg/dL (ref 8.9–10.3)
Chloride: 104 mmol/L (ref 98–111)
Creatinine, Ser: 0.77 mg/dL (ref 0.44–1.00)
GFR, Estimated: >60 mL/min (ref >60)
Glucose, Bld: 144 mg/dL — ABNORMAL HIGH (ref 70–99)
Potassium: 3.6 mmol/L (ref 3.5–5.1)
Sodium: 138 mmol/L (ref 135–145)
Total Bilirubin: 0.5 mg/dL (ref 0.0–1.2)
Total Protein: 6.5 g/dL (ref 6.5–8.1)

## 2024-04-26 LAB — CBC
HCT: 41.1 % (ref 36.0–46.0)
Hemoglobin: 12.6 g/dL (ref 12.0–15.0)
MCH: 26.5 pg (ref 26.0–34.0)
MCHC: 30.7 g/dL (ref 30.0–36.0)
MCV: 86.5 fL (ref 80.0–100.0)
Platelets: 293 K/uL (ref 150–400)
RBC: 4.75 MIL/uL (ref 3.87–5.11)
RDW: 12.1 % (ref 11.5–15.5)
WBC: 5.4 K/uL (ref 4.0–10.5)
nRBC: 0 % (ref 0.0–0.2)

## 2024-04-26 MED ORDER — BISACODYL 10 MG RE SUPP
10.0000 mg | Freq: Every day | RECTAL | Status: DC | PRN
  Filled 2024-04-26: qty 1

## 2024-04-26 MED ORDER — SENNOSIDES-DOCUSATE SODIUM 8.6-50 MG PO TABS
1.0000 | ORAL_TABLET | Freq: Once | ORAL | Status: AC
  Administered 2024-04-26: 1 via ORAL
  Filled 2024-04-26: qty 1

## 2024-04-26 NOTE — Plan of Care (Signed)

## 2024-04-26 NOTE — Progress Notes (Signed)
  Progress Note   Patient: Jo Allen FMW:[id number] DOB: [birth year] DOA: 04/19/2024     7 DOS: the patient was seen and examined on 04/26/2024        Brief hospital course: 57yo with h/o HTN, GERD, and morbid obesity who presented on 7/3 with abdominal pain.  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  Currently treating acute pancreatitis, GI consulted.     Assessment and Plan: Acute pancreatitis Overall trajectory improving, but still with considerable pain, using frequent opiates. - Continue full liquids - Continue pancreas enzymes   Liver lesions Discussed with oncology.  PET scan unreliable in setting of pancreatitis.  Rapid diagnostic clinic would be of no benefit if patietn already established with GI for EUS.  Talked with IR, lesions not possible to biopsy (not visible on CT).  CA19-9 normal. - Outpatient EUS asap  Hypertension Hyperlipidemia BP normal - Continue amlodipine , metoprolol  - Hold Crestor   Mood disorder - Continue Bupropion , Seroquel , Lamictal   Hypokalemia    Normocytic anemia Mild, no bleeding  Class III obesity Complicates care, BMI 41        Subjective: Patient with pain in LUQ and epigastrium, worse with eating, still taking oxycodone  every few hours.  No fever. No vomiting.  No confusion.  Pain with inspiration.     Physical Exam: BP 122/80   Pulse 84   Temp 97.8 F (36.6 C)   Resp 18   Ht 5' 4 (1.626 m)   Wt 108.9 kg   SpO2 98%   BMI 41.20 kg/m   Obese adult female, sitting on edge of bed. interactive and appropriate RRR, no murmurs, no peripheral edema Respiratory rate normal, lungs clear without rales or wheezes Abdomen remains tender, with guarding.  No rigidity or rebound Attention normal, affect anxious, judgment and insight appear normal, face symmetric, speech fluent  Data Reviewed: CMP  and CBC unremarkable   Family Communication:     Disposition: Status is: Inpatient         Author: Lonni SHAUNNA Dalton, MD 04/26/2024 11:19 AM  For on call review [email or web address].

## 2024-04-27 ENCOUNTER — Other Ambulatory Visit (HOSPITAL_COMMUNITY): Payer: Self-pay

## 2024-04-27 DIAGNOSIS — K8500 Idiopathic acute pancreatitis without necrosis or infection: Secondary | ICD-10-CM | POA: Diagnosis not present

## 2024-04-27 MED ORDER — ONDANSETRON HCL 4 MG PO TABS
4.0000 mg | ORAL_TABLET | Freq: Four times a day (QID) | ORAL | 0 refills | Status: DC | PRN
  Filled 2024-04-27: qty 20, 5d supply, fill #0

## 2024-04-27 MED ORDER — PANCRELIPASE (LIP-PROT-AMYL) 36000-114000 UNITS PO CPEP
36000.0000 [IU] | ORAL_CAPSULE | Freq: Three times a day (TID) | ORAL | 0 refills | Status: DC
  Filled 2024-04-27: qty 200, 67d supply, fill #0

## 2024-04-27 MED ORDER — OXYCODONE HCL 5 MG PO TABS
5.0000 mg | ORAL_TABLET | Freq: Four times a day (QID) | ORAL | 0 refills | Status: DC | PRN
  Filled 2024-04-27: qty 15, 4d supply, fill #0

## 2024-04-27 NOTE — Discharge Summary (Signed)
 Physician Discharge Summary   Patient: Jo Allen MRN: [id number] DOB: Sep 23, 1966  Admit date:     04/19/2024  Discharge date: 04/27/24  Discharge Physician: Lonni SHAUNNA Dalton   PCP: [name], PA     Recommendations at discharge:  Follow-up with [facility] gastroenterology Dr. Burnette in 2 to 3 weeks for pancreatic mass and liver masses     Discharge Diagnoses: Principal Problem:   Acute pancreatitis Active Problems:   Pancreatic mass   Essential hypertension   Dyslipidemia   GERD (gastroesophageal reflux disease)   Morbid obesity with BMI of 40.0-44.9, adult [facility])    Hospital Course: 58 y.o. F with obesity, HTN presented with several days epigastric pain.  Lipase elevated, CT showed pancreatitis, also possible pancreas tail mass.  Admitted for further care.       Acute pancreatitis Admitted and treated conservatively.  Advanced to solid diet, and pain tolerable.  Has close GI follow up for below.     Pancreas tail mass and liver lesions CT showed 1.4 mm hypoattenuating pancreas tail mass.  Follow-up MRCP showed hypoenhancing lesion in the central pancreatic tail, as well as numerous tiny diffusion restricting foci scattered in the liver.  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  CA19-9 checked and normal.  GI consulted, and recommended outpatient follow up with Dr. Burnette for EUS vs repeat MRCP in 2-3 weeks.    Discussed with IR, liver lesions not biopsy-able due to the fact they are not visible on CT.  Discussed with oncology.  PET scan unreliable in setting of pancreatitis.  Most timely plan would be the above follow up with GI for EUS vs repeat imaging.       Hypertension Hyperlipidemia BP normal on amlodipine , metoprolol    Mood disorder Stable on Bupropion , Seroquel , Lamictal     Hypokalemia Resolved    Normocytic anemia Mild, no bleeding   Class III obesity Complicates care, BMI 41              The [place]  Controlled Substances Registry was reviewed for this patient prior to discharge.  Consultants: Gastroenterology Dr. Rosalie Procedures performed: CT abdomen MRCP   Disposition: Home Diet recommendation:  Discharge Diet Orders (From admission, onward)     Start     Ordered   04/27/24 0000  Diet - low sodium heart healthy        04/27/24 0923             DISCHARGE MEDICATION: Allergies as of 04/27/2024       Reactions   Morphine  And Codeine Nausea And Vomiting, Hives, Itching   Trazodone Nausea Only   dizziness, headache   Penicillins Hives   Tramadol     Upset stomach        Medication List     STOP taking these medications    ondansetron  8 MG disintegrating tablet Commonly known as: ZOFRAN -ODT   ramelteon 8 MG tablet Commonly known as: ROZEREM   rosuvastatin  20 MG tablet Commonly known as: CRESTOR        TAKE these medications    albuterol  108 (90 Base) MCG/ACT inhaler Commonly known as: VENTOLIN  HFA Inhale 1-2 puffs into the lungs every 6 (six) hours as needed for wheezing or shortness of breath. What changed: how much to take   amLODipine  10 MG tablet Commonly known as: NORVASC  Take 1 tablet (10 mg total) by mouth  daily.   Biotin  1000 MCG Chew Chew 2,000 mcg by mouth daily.   buPROPion  150 MG 24 hr tablet Commonly known as: WELLBUTRIN  XL Take 150 mg by mouth daily.   CRANBERRY PO Take 1 tablet by mouth daily.   Creon  36000-114000 units Cpep capsule Generic drug: lipase/protease/amylase Take 1 capsule (36,000 Units total) by mouth 3 (three) times daily with meals.   cyanocobalamin  1000 MCG tablet Commonly known as: VITAMIN B12 Take 1,000 mcg by mouth daily.   cyclobenzaprine  10 MG tablet Commonly known as: FLEXERIL  Take 1 tablet (10 mg total) by mouth 2 (two) times daily as needed  for muscle spasms. What changed: when to take this   ferrous sulfate  325 (65 FE) MG EC tablet Take 325 mg by mouth daily.   gabapentin  100 MG capsule Commonly known as: NEURONTIN  Take 100 mg by mouth every 8 (eight) hours.   hydrOXYzine 50 MG tablet Commonly known as: ATARAX Take 50 mg by mouth at bedtime.   lamoTRIgine  200 MG tablet Commonly known as: LAMICTAL  Take 200 mg by mouth 2 (two) times daily.   levocetirizine 5 MG tablet Commonly known as: XYZAL Take 5 mg by mouth daily.   MAGNESIUM 27 PO Take 1 tablet by mouth daily.   meloxicam 15 MG tablet Commonly known as: MOBIC Take 15 mg by mouth daily.   metoprolol  succinate 50 MG 24 hr tablet Commonly known as: Toprol  XL Take 1 tablet (50 mg total) by mouth daily.   nitroGLYCERIN  0.4 MG SL tablet Commonly known as: Nitrostat  Place 1 tablet (0.4 mg total) under the tongue every 5 (five) minutes as needed for chest pain. If you require more than two tablets five minutes apart go to the nearest ER via EMS.   ondansetron  4 MG tablet Commonly known as: ZOFRAN  Take 1 tablet (4 mg total) by mouth every 6 (six) hours as needed for nausea.   oxyCODONE  5 MG immediate release tablet Commonly known as: Oxy IR/ROXICODONE  Take 1 tablet (5 mg total) by mouth every 6 (six) hours as needed for moderate pain (pain score 4-6) or severe pain (pain score 7-10).   QUEtiapine  100 MG tablet Commonly known as: SEROQUEL  Take 100 mg by mouth at bedtime.   tiZANidine  2 MG tablet Commonly known as: ZANAFLEX  Take 1-2 tablets (2-4 mg total) by mouth every 6 (six) hours as needed for muscle spasms. What changed:  how much to take when to take this   Vitamin D3 50 MCG (2000 UT) capsule Take 4,000 Units by mouth daily.   zaleplon 5 MG capsule Commonly known as: SONATA Take 5 mg by mouth at bedtime.        Follow-up Information     Rosalie Kitchens, MD. Schedule an appointment as soon as possible for a visit.   Specialty:  Gastroenterology Contact information: [address] [place] [address] [phone]                 Discharge Instructions     Diet - low sodium heart healthy   Complete by: As directed    Discharge instructions   Complete by: As directed    **IMPORTANT DISCHARGE INSTRUCTIONS**   From Dr. Jonel: You were admitted for pancreatitis.  Here, you were found to have a small spot in your pancreas that may be a cancer or may be a small cyst.  Our gastroenterologists can't do further testing until the pancreatitis resolves.  For the next two weeks, while the pancreatitis  is resolving, eat a bland diet. AVOID FRIED FOODS Eat bland things like applesauce, rice, toast, crackers, cereal, mashed potatoes.   Eat lean meats or proteins only and in small amounts (chicken or eggs, no red meat, no greasy meat, no pork)  AVOID FRIED FOODS and AVOID GREASY FOODS  For nausea take ondansetron  For pain, take oxycodone  For constipation, take MiraLAX  daily plus senna or Senokot-S  Resume your other home medicines  Call [facility] Let them know you saw Dr. Rosalie in the hospital and are supposed to follow up with Dr. Burnette in 2-3 weeks   Increase activity slowly   Complete by: As directed        Discharge Exam: Filed Weights   04/19/24 0531 04/19/24 1434  Weight: 108.9 kg 108.9 kg    General: Pt is alert, awake, not in acute distress Cardiovascular: RRR, nl S1-S2, no murmurs appreciated.   No LE edema.   Respiratory: Normal respiratory rate and rhythm.  CTAB without rales or wheezes. Abdominal: Abdomen soft and with some guarding and mild to moderate epigastric TTP.  No distension or HSM.   Neuro/Psych: Strength symmetric in upper and lower extremities.  Judgment and insight appear normal.   Condition at discharge: good  The results of significant diagnostics from this hospitalization (including imaging, microbiology, ancillary and laboratory) are  listed below for reference.   Imaging Studies: MR ABDOMEN W WO CONTRAST Result Date: 04/19/2024 CLINICAL DATA:  Pancreatitis, rule out pancreatic mass suspected by prior CT EXAM: MRI ABDOMEN WITHOUT AND WITH CONTRAST TECHNIQUE: Multiplanar multisequence MR imaging of the abdomen was performed both before and after the administration of intravenous contrast. CONTRAST:  9mL GADAVIST  GADOBUTROL  1 MMOL/ML IV SOLN COMPARISON:  CT abdomen pelvis, 03/20/2024 FINDINGS: Lower chest: No acute abnormality. Hepatobiliary: Hepatomegaly, maximum coronal span 20.2 cm. Hepatic steatosis. Numerous tiny diffusion restricting foci scattered throughout the liver, some of the largest of which are with faint associated hyperenhancement on early arterial phase (series 14, image 26, 29, series 8, image 11). No gallstones, gallbladder wall thickening, or biliary dilatation. Pancreas: Edematous appearance of the pancreatic tail with extensive peripancreatic and retroperitoneal fat stranding. In the central pancreatic tail there is a focal, hypoenhancing lesion corresponding to findings of prior CT measuring 1.1 x 0.8 cm (series 14, image 51). The pancreatic duct is focally effaced in this vicinity. No upstream pancreatic ductal dilatation. Spleen: Normal in size without significant abnormality. Adrenals/Urinary Tract: Adrenal glands are unremarkable. Kidneys are normal, without renal calculi, solid lesion, or hydronephrosis. Stomach/Bowel: Stomach is within normal limits. No evidence of bowel wall thickening, distention, or inflammatory changes. Vascular/Lymphatic: No significant vascular findings are present. No enlarged abdominal lymph nodes. Other: No abdominal wall hernia or abnormality. No ascites. Musculoskeletal: No acute or significant osseous findings. IMPRESSION: 1. Edematous appearance of the pancreatic tail with extensive peripancreatic and retroperitoneal fat stranding, consistent with acute pancreatitis. 2. In the central  pancreatic tail there is a focal, hypoenhancing lesion corresponding to findings of prior CT measuring 1.1 x 0.8 cm. The pancreatic duct is focally effaced in this vicinity. No upstream pancreatic ductal dilatation. 3. Numerous tiny diffusion restricting foci scattered throughout the liver, some of the largest of which are with faint associated hyperenhancement on early arterial phase. 4. 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 Appearance could however be explained by focal pancreatic parenchymal necrosis and developing acute pancreatic fluid collection as well as the incidental presence of  very small focal nodular hyperplasias or other benign liver lesions. Recommend close follow-up to exclude malignancy. 5. Hepatomegaly and hepatic steatosis. Electronically Signed   By: Marolyn JONETTA Jaksch M.D.   On: 04/19/2024 13:16   MR 3D Recon At Scanner Result Date: 04/19/2024 CLINICAL DATA:  Pancreatitis, rule out pancreatic mass suspected by prior CT EXAM: MRI ABDOMEN WITHOUT AND WITH CONTRAST TECHNIQUE: Multiplanar multisequence MR imaging of the abdomen was performed both before and after the administration of intravenous contrast. CONTRAST:  9mL GADAVIST  GADOBUTROL  1 MMOL/ML IV SOLN COMPARISON:  CT abdomen pelvis, 03/20/2024 FINDINGS: Lower chest: No acute abnormality. Hepatobiliary: Hepatomegaly, maximum coronal span 20.2 cm. Hepatic steatosis. Numerous tiny diffusion restricting foci scattered throughout the liver, some of the largest of which are with faint associated hyperenhancement on early arterial phase (series 14, image 26, 29, series 8, image 11). No gallstones, gallbladder wall thickening, or biliary dilatation. Pancreas: Edematous appearance of the pancreatic tail with extensive peripancreatic and retroperitoneal fat stranding. In the central pancreatic tail there is a focal, hypoenhancing lesion corresponding  to findings of prior CT measuring 1.1 x 0.8 cm (series 14, image 51). The pancreatic duct is focally effaced in this vicinity. No upstream pancreatic ductal dilatation. Spleen: Normal in size without significant abnormality. Adrenals/Urinary Tract: Adrenal glands are unremarkable. Kidneys are normal, without renal calculi, solid lesion, or hydronephrosis. Stomach/Bowel: Stomach is within normal limits. No evidence of bowel wall thickening, distention, or inflammatory changes. Vascular/Lymphatic: No significant vascular findings are present. No enlarged abdominal lymph nodes. Other: No abdominal wall hernia or abnormality. No ascites. Musculoskeletal: No acute or significant osseous findings. IMPRESSION: 1. Edematous appearance of the pancreatic tail with extensive peripancreatic and retroperitoneal fat stranding, consistent with acute pancreatitis. 2. In the central pancreatic tail there is a focal, hypoenhancing lesion corresponding to findings of prior CT measuring 1.1 x 0.8 cm. The pancreatic duct is focally effaced in this vicinity. No upstream pancreatic ductal dilatation. 3. Numerous tiny diffusion restricting foci scattered throughout the liver, some of the largest of which are with faint associated hyperenhancement on early arterial phase. 4. 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 Appearance could however be explained by focal pancreatic parenchymal necrosis and developing acute pancreatic fluid collection as well as the incidental presence of very small focal nodular hyperplasias or other benign liver lesions. Recommend close follow-up to exclude malignancy. 5. Hepatomegaly and hepatic steatosis. Electronically Signed   By: Marolyn JONETTA Jaksch M.D.   On: 04/19/2024 13:16   CT ABDOMEN PELVIS W CONTRAST Result Date: 04/19/2024 CLINICAL DATA:  Left lower quadrant abdominal pain. Pain and discomfort under left  breast, epigastric area, and left lower back. EXAM: CT ABDOMEN AND PELVIS WITH CONTRAST TECHNIQUE: Multidetector CT imaging of the abdomen and pelvis was performed using the standard protocol following bolus administration of intravenous contrast. RADIATION DOSE REDUCTION: This exam was performed according to the departmental dose-optimization program which includes automated exposure control, adjustment of the mA and/or kV according to patient size and/or use of iterative reconstruction technique. CONTRAST:  OMNIPAQUE  IOHEXOL  300 MG/ML  SOLN COMPARISON:  02/03/2024 FINDINGS: Lower chest: Unremarkable. Hepatobiliary: No suspicious focal abnormality within the liver parenchyma. There is no evidence for gallstones, gallbladder wall thickening, or pericholecystic fluid. No intrahepatic or extrahepatic biliary dilation. Pancreas: Intra and peripancreatic edema noted in the pancreatic tail. No main duct dilatation. 14 mm ill-defined hypoattenuating focus in the tail of pancreas (24/2) may reflect focal edema. Pancreatic  parenchyma enhances throughout. No focal peripancreatic fluid collection. Spleen: No splenomegaly. No suspicious focal mass lesion. 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 Adrenals/Urinary Tract: No adrenal nodule or mass. Kidneys unremarkable. No evidence for hydroureter. Bladder is decompressed which accentuates wall thickness. Stomach/Bowel: Stomach is unremarkable. No gastric wall thickening. No evidence of outlet obstruction. Duodenum is normally positioned as is the ligament of Treitz. No small bowel wall thickening. No small bowel dilatation. The terminal ileum is normal. The appendix is not well visualized, but there is no edema or inflammation in the region of the cecal tip to suggest appendicitis. No gross colonic mass. No colonic wall thickening. Vascular/Lymphatic: There is mild atherosclerotic calcification of the abdominal aorta without aneurysm. There is no  gastrohepatic or hepatoduodenal ligament lymphadenopathy. No retroperitoneal or mesenteric lymphadenopathy. No pelvic sidewall lymphadenopathy. Reproductive: Unremarkable. Other: No intraperitoneal free fluid. Musculoskeletal: No worrisome lytic or sclerotic osseous abnormality. IMPRESSION: 1. Intra and peripancreatic edema involving the pancreatic tail with thrombosis of the splenic vein along the tail the pancreas. Imaging features compatible with acute pancreatitis. No focal peripancreatic fluid collection. 2. 14 mm ill-defined hypoattenuating focus in the tail of pancreas may reflect focal edema. Consider follow-up MRI abdomen with and without contrast to exclude underlying parenchymal lesion. 3.  Aortic Atherosclerosis (ICD10-I70.0). Electronically Signed   By: Camellia Candle M.D.   On: 04/19/2024 08:05   DG Chest Port 1 View Result Date: 04/19/2024 CLINICAL DATA:  Shortness of breath and left chest pain EXAM: PORTABLE CHEST 1 VIEW COMPARISON:  03/24/2023 FINDINGS: The lungs are clear without focal pneumonia, edema, pneumothorax or pleural effusion. Cardiopericardial silhouette is at upper limits of normal for size. No acute bony abnormality. IMPRESSION: No active disease. Electronically Signed   By: Camellia Candle M.D.   On: 04/19/2024 06:18    Microbiology: No results found for this or any previous visit.  Labs: CBC: Recent Labs  Lab 04/21/24 0913 04/24/24 0433 04/26/24 1003  WBC 5.6 5.2 5.4  NEUTROABS 2.8 2.4  --   HGB 11.9* 11.3* 12.6  HCT 38.4 36.6 41.1  MCV 87.5 86.3 86.5  PLT 211 245 293   Basic Metabolic Panel: Recent Labs  Lab 04/21/24 0913 04/24/24 0433 04/26/24 1003  NA 139 140 138  K 3.5 3.3* 3.6  CL 101 105 104  CO2 27 28 25   GLUCOSE 83 126* 144*  BUN 7 7 8   CREATININE 0.91 0.88 0.77  CALCIUM  8.7* 9.0 9.1   Liver Function Tests: Recent Labs  Lab 04/21/24 0913 04/24/24 0433 04/26/24 1003  AST 15 16 22   ALT 11 11 14   ALKPHOS 81 87 97  BILITOT 0.6 0.6 0.5   PROT 5.1* 5.7* 6.5  ALBUMIN 2.7* 2.8* 3.3*   CBG: No results for input(s): GLUCAP in the last 168 hours.  Discharge time spent: approximately 45 minutes spent on discharge counseling, evaluation of patient on day of discharge, and coordination of discharge planning with nursing, social work, pharmacy and case management  Signed: Lonni SHAUNNA Dalton, MD [facility] 04/27/2024

## 2024-04-27 NOTE — Progress Notes (Signed)
 TOC meds in a secure bag delivered to patient in room  by this RN

## 2024-04-30 ENCOUNTER — Telehealth: Payer: Self-pay

## 2024-04-30 NOTE — Transitions of Care (Post Inpatient/ED Visit) (Signed)
   04/30/2024  Name: Jo Allen MRN: [id number] DOB: 1966-09-18  Today's TOC FU Call Status: Today's TOC FU Call Status:: Unsuccessful Call (1st Attempt)  Attempted to reach the patient regarding the most recent Inpatient/ED visit.  Follow Up Plan: Additional outreach attempts will be made to reach the patient to complete the Transitions of Care (Post Inpatient/ED visit) call.   Rochella Benner J. Locklan Canoy RN, MSN [facility], [facility] RN Care Manager Direct Dial: [phone]  Fax: [phone] Website: [email or web address]

## 2024-05-01 ENCOUNTER — Telehealth: Payer: Self-pay

## 2024-05-01 NOTE — Transitions of Care (Post Inpatient/ED Visit) (Signed)
   05/01/2024  Name: Jo Allen MRN: [id number] DOB: 1965-11-18  Today's TOC FU Call Status: Today's TOC FU Call Status:: Unsuccessful Call (2nd Attempt)  Attempted to reach the patient regarding the most recent Inpatient/ED visit.  Follow Up Plan: Additional outreach attempts will be made to reach the patient to complete the Transitions of Care (Post Inpatient/ED visit) call.   Bricia Taher J. Julie-Anne Torain RN, MSN [facility], [facility] Health RN Care Manager Direct Dial: [phone]  Fax: [phone] Website: [email or web address]

## 2024-05-02 ENCOUNTER — Telehealth: Payer: Self-pay

## 2024-05-02 NOTE — Transitions of Care (Post Inpatient/ED Visit) (Signed)
   05/02/2024  Name: Jo Allen MRN: [id number] DOB: 1966-05-07  Today's TOC FU Call Status: Today's TOC FU Call Status:: Unsuccessful Call (3rd Attempt) Unsuccessful Call (3rd Attempt) Date: 05/02/24  Attempted to reach the patient regarding the most recent Inpatient/ED visit.  Follow Up Plan: No further outreach attempts will be made at this time. We have been unable to contact the patient.  Odilon Cass J. Lakya Schrupp RN, MSN [facility], [facility] RN Care Manager Direct Dial: [phone]  Fax: [phone] Website: [email or web address]

## 2024-05-03 DIAGNOSIS — R935 Abnormal findings on diagnostic imaging of other abdominal regions, including retroperitoneum: Secondary | ICD-10-CM | POA: Diagnosis not present

## 2024-05-03 DIAGNOSIS — K859 Acute pancreatitis without necrosis or infection, unspecified: Secondary | ICD-10-CM | POA: Diagnosis not present

## 2024-05-07 DIAGNOSIS — J4530 Mild persistent asthma, uncomplicated: Secondary | ICD-10-CM | POA: Diagnosis not present

## 2024-05-07 DIAGNOSIS — I10 Essential (primary) hypertension: Secondary | ICD-10-CM | POA: Diagnosis not present

## 2024-05-07 DIAGNOSIS — R7303 Prediabetes: Secondary | ICD-10-CM | POA: Diagnosis not present

## 2024-05-07 DIAGNOSIS — K8689 Other specified diseases of pancreas: Secondary | ICD-10-CM | POA: Diagnosis not present

## 2024-05-07 DIAGNOSIS — K219 Gastro-esophageal reflux disease without esophagitis: Secondary | ICD-10-CM | POA: Diagnosis not present

## 2024-05-07 DIAGNOSIS — F329 Major depressive disorder, single episode, unspecified: Secondary | ICD-10-CM | POA: Diagnosis not present

## 2024-05-07 DIAGNOSIS — I2510 Atherosclerotic heart disease of native coronary artery without angina pectoris: Secondary | ICD-10-CM | POA: Diagnosis not present

## 2024-05-07 DIAGNOSIS — K858 Other acute pancreatitis without necrosis or infection: Secondary | ICD-10-CM | POA: Diagnosis not present

## 2024-05-07 DIAGNOSIS — E782 Mixed hyperlipidemia: Secondary | ICD-10-CM | POA: Diagnosis not present

## 2024-05-07 DIAGNOSIS — M5441 Lumbago with sciatica, right side: Secondary | ICD-10-CM | POA: Diagnosis not present

## 2024-06-01 ENCOUNTER — Ambulatory Visit (INDEPENDENT_AMBULATORY_CARE_PROVIDER_SITE_OTHER): Admitting: Otolaryngology

## 2024-06-19 ENCOUNTER — Other Ambulatory Visit: Payer: Self-pay | Admitting: Gastroenterology

## 2024-06-27 ENCOUNTER — Ambulatory Visit (INDEPENDENT_AMBULATORY_CARE_PROVIDER_SITE_OTHER): Admitting: Otolaryngology

## 2024-06-27 ENCOUNTER — Encounter (HOSPITAL_COMMUNITY): Payer: Self-pay | Admitting: Gastroenterology

## 2024-06-27 NOTE — Progress Notes (Signed)
 Attempted to obtain medical history for pre op call via telephone, unable to reach at this time. HIPAA compliant voicemail message left requesting return call to pre surgical testing department.

## 2024-06-29 DIAGNOSIS — M79671 Pain in right foot: Secondary | ICD-10-CM | POA: Diagnosis not present

## 2024-07-03 NOTE — Anesthesia Preprocedure Evaluation (Addendum)
 Anesthesia Evaluation  Patient identified by MRN, date of birth, ID band Patient awake    Reviewed: Allergy & Precautions, H&P , NPO status , Patient's Chart, lab work & pertinent test results  Airway Mallampati: III  TM Distance: >3 FB Neck ROM: Full    Dental  (+) Dental Advisory Given, Teeth Intact   Pulmonary asthma    Pulmonary exam normal breath sounds clear to auscultation       Cardiovascular hypertension, Pt. on medications + angina  + CAD  Normal cardiovascular exam Rhythm:Regular Rate:Normal  Left Heart Catheterization 04/29/23:  LV 131/12, EDP 19 mmHg.  Ao 161/83, mean Wollin 15 mmHg.  No pressure gradient across the aortic valve.  LVEF 55 to 60%. RCA: Dominant, smooth and normal. LM: Large-caliber vessel.  Smooth and normal. LCx: Very large caliber vessel, gives origin to a small AV groove branch and continues distally as to large distal circumflex gives origin to large OM1 and OM 2.  Smooth and normal.  Mildly tortuous. LAD: Large-caliber vessel.  It is tortuous in the proximal and mid segment.  It is smooth and normal.   Echocardiogram 04/15/2023: Normal LV systolic function with visual EF 55-60%. Left ventricle cavity is normal in size. Normal left ventricular wall thickness. Presence of a septal bulge. Normal global wall motion. Normal diastolic filling pattern, normal LAP. Mild mitral annular calcification, no evidence of mitral stenosis. No significant valvular heart disease. No prior study for comparison.       Neuro/Psych  Headaches  negative psych ROS   GI/Hepatic Neg liver ROS,GERD  Medicated and Controlled,,  Endo/Other  negative endocrine ROS    Renal/GU negative Renal ROS     Musculoskeletal negative musculoskeletal ROS (+)    Abdominal  (+) + obese  Peds  Hematology negative hematology ROS (+)   Anesthesia Other Findings   Reproductive/Obstetrics                               Anesthesia Physical Anesthesia Plan  ASA: 2  Anesthesia Plan: MAC   Post-op Pain Management: Minimal or no pain anticipated   Induction: Intravenous  PONV Risk Score and Plan: 2 and Propofol  infusion, TIVA and Treatment may vary due to age or medical condition  Airway Management Planned:   Additional Equipment:   Intra-op Plan:   Post-operative Plan:   Informed Consent: I have reviewed the patients History and Physical, chart, labs and discussed the procedure including the risks, benefits and alternatives for the proposed anesthesia with the patient or authorized representative who has indicated his/her understanding and acceptance.     Dental advisory given  Plan Discussed with: CRNA  Anesthesia Plan Comments:          Anesthesia Quick Evaluation

## 2024-07-04 ENCOUNTER — Encounter (HOSPITAL_COMMUNITY): Payer: Self-pay | Admitting: Gastroenterology

## 2024-07-04 ENCOUNTER — Ambulatory Visit (HOSPITAL_COMMUNITY)
Admission: RE | Admit: 2024-07-04 | Discharge: 2024-07-04 | Disposition: A | Discharge status: Home / Self Care | Attending: Gastroenterology | Admitting: Gastroenterology

## 2024-07-04 ENCOUNTER — Other Ambulatory Visit: Payer: Self-pay

## 2024-07-04 ENCOUNTER — Ambulatory Visit (HOSPITAL_COMMUNITY): Payer: Self-pay | Admitting: Anesthesiology

## 2024-07-04 ENCOUNTER — Encounter (HOSPITAL_COMMUNITY)
Admission: RE | Disposition: A | Discharge status: Home / Self Care | Payer: Self-pay | Source: Home / Self Care | Attending: Gastroenterology

## 2024-07-04 ENCOUNTER — Ambulatory Visit (HOSPITAL_BASED_OUTPATIENT_CLINIC_OR_DEPARTMENT_OTHER): Payer: Self-pay | Admitting: Anesthesiology

## 2024-07-04 DIAGNOSIS — J45909 Unspecified asthma, uncomplicated: Secondary | ICD-10-CM | POA: Insufficient documentation

## 2024-07-04 DIAGNOSIS — R7303 Prediabetes: Secondary | ICD-10-CM | POA: Diagnosis not present

## 2024-07-04 DIAGNOSIS — R1084 Generalized abdominal pain: Secondary | ICD-10-CM | POA: Diagnosis not present

## 2024-07-04 DIAGNOSIS — K8689 Other specified diseases of pancreas: Secondary | ICD-10-CM | POA: Diagnosis not present

## 2024-07-04 DIAGNOSIS — C229 Malignant neoplasm of liver, not specified as primary or secondary: Secondary | ICD-10-CM | POA: Diagnosis not present

## 2024-07-04 DIAGNOSIS — C7A1 Malignant poorly differentiated neuroendocrine tumors: Secondary | ICD-10-CM | POA: Insufficient documentation

## 2024-07-04 DIAGNOSIS — K219 Gastro-esophageal reflux disease without esophagitis: Secondary | ICD-10-CM | POA: Diagnosis not present

## 2024-07-04 DIAGNOSIS — I10 Essential (primary) hypertension: Secondary | ICD-10-CM

## 2024-07-04 DIAGNOSIS — Z6838 Body mass index (BMI) 38.0-38.9, adult: Secondary | ICD-10-CM | POA: Insufficient documentation

## 2024-07-04 DIAGNOSIS — C252 Malignant neoplasm of tail of pancreas: Secondary | ICD-10-CM | POA: Insufficient documentation

## 2024-07-04 DIAGNOSIS — E669 Obesity, unspecified: Secondary | ICD-10-CM | POA: Diagnosis not present

## 2024-07-04 DIAGNOSIS — I2510 Atherosclerotic heart disease of native coronary artery without angina pectoris: Secondary | ICD-10-CM | POA: Insufficient documentation

## 2024-07-04 DIAGNOSIS — I25119 Atherosclerotic heart disease of native coronary artery with unspecified angina pectoris: Secondary | ICD-10-CM

## 2024-07-04 DIAGNOSIS — R933 Abnormal findings on diagnostic imaging of other parts of digestive tract: Secondary | ICD-10-CM | POA: Diagnosis not present

## 2024-07-04 DIAGNOSIS — R1012 Left upper quadrant pain: Secondary | ICD-10-CM | POA: Diagnosis present

## 2024-07-04 HISTORY — PX: FINE NEEDLE ASPIRATION: SHX6590

## 2024-07-04 HISTORY — PX: EUS: SHX5427

## 2024-07-04 HISTORY — PX: ESOPHAGOGASTRODUODENOSCOPY: SHX5428

## 2024-07-04 SURGERY — ULTRASOUND, UPPER GI TRACT, ENDOSCOPIC
Anesthesia: Monitor Anesthesia Care

## 2024-07-04 MED ORDER — SODIUM CHLORIDE 0.9 % IV SOLN: INTRAVENOUS | Status: DC

## 2024-07-04 MED ORDER — PROPOFOL 1000 MG/100ML IV EMUL
INTRAVENOUS | Status: AC
  Filled 2024-07-04: qty 100

## 2024-07-04 MED ORDER — PROPOFOL 10 MG/ML IV BOLUS
INTRAVENOUS | Status: AC
  Filled 2024-07-04: qty 20

## 2024-07-04 MED ORDER — ONDANSETRON HCL 4 MG/2ML IJ SOLN
INTRAMUSCULAR | Status: AC
  Filled 2024-07-04: qty 2

## 2024-07-04 MED ORDER — ONDANSETRON HCL 4 MG/2ML IJ SOLN
4.0000 mg | Freq: Once | INTRAMUSCULAR | Status: AC
  Administered 2024-07-04: 4 mg via INTRAVENOUS

## 2024-07-04 MED ORDER — LABETALOL HCL 5 MG/ML IV SOLN
INTRAVENOUS | Status: DC | PRN
  Administered 2024-07-04 (×2): 5 mg via INTRAVENOUS

## 2024-07-04 MED ORDER — GLYCOPYRROLATE PF 0.2 MG/ML IJ SOSY
PREFILLED_SYRINGE | INTRAMUSCULAR | Status: DC | PRN
Start: 2024-07-04 — End: 2024-07-04
  Administered 2024-07-04: .2 mg via INTRAVENOUS

## 2024-07-04 MED ORDER — PROPOFOL 500 MG/50ML IV EMUL
INTRAVENOUS | Status: DC | PRN
  Administered 2024-07-04: 100 mg via INTRAVENOUS
  Administered 2024-07-04: 50 mg via INTRAVENOUS
  Administered 2024-07-04: 100 ug/kg/min via INTRAVENOUS
  Administered 2024-07-04: 50 mg via INTRAVENOUS

## 2024-07-04 MED ORDER — LIDOCAINE 2% (20 MG/ML) 5 ML SYRINGE
INTRAMUSCULAR | Status: DC | PRN
  Administered 2024-07-04: 100 mg via INTRAVENOUS

## 2024-07-04 NOTE — Anesthesia Procedure Notes (Signed)
 Procedure Name: MAC Date/Time: 07/04/2024 10:00 AM  Performed by: Nada Corean CROME, CRNAPre-anesthesia Checklist: Patient identified, Emergency Drugs available, Suction available, Patient being monitored and Timeout performed Patient Re-evaluated:Patient Re-evaluated prior to induction Oxygen Delivery Method: Circle system utilized Preoxygenation: Pre-oxygenation with 100% oxygen Induction Type: IV induction Placement Confirmation: positive ETCO2 Dental Injury: Teeth and Oropharynx as per pre-operative assessment  Comments: POM mask used

## 2024-07-04 NOTE — Op Note (Signed)
 [facility] Patient Name: Jo Allen Procedure Date: 07/04/2024 MRN: [id number] Attending MD: Elsie Cree , MD, [id number] Date of Birth: 06/10/1966 CSN: [id number] Age: 58 Admit Type: Outpatient Procedure:                Upper EUS Indications:              Abnormal MRCP, Abdominal pain in the left upper                            quadrant, Weight loss Providers:                Elsie Cree, MD, Darleene Bare, RN, [facility]                            [place], Technician, Rachel Crowder,CRNA Referring MD:              Medicines:                Monitored Anesthesia Care Complications:            No immediate complications. Estimated Blood Loss:     Estimated blood loss: none. Procedure:                Pre-Anesthesia Assessment:                           - Prior to the procedure, a History and Physical                            was performed, and patient medications and                            allergies were reviewed. The patient's tolerance of                            previous anesthesia was also reviewed. The risks                            and benefits of the procedure and the sedation                            options and risks were discussed with the patient.                            All questions were answered, and informed consent                            was obtained. Prior Anticoagulants: The patient has                            taken no anticoagulant or antiplatelet agents. ASA                            Grade Assessment: II - A patient with mild systemic  disease. After reviewing the risks and benefits,                            the patient was deemed in satisfactory condition to                            undergo the procedure.                           After obtaining informed consent, the endoscope was                            passed under direct vision. Throughout the                             procedure, the patient's blood pressure, pulse, and                            oxygen saturations were monitored continuously. The                            [id number] ([id number] Olympus ultrasound scope was                            introduced through the mouth, and advanced to the                            duodenal bulb. The upper EUS was accomplished                            without difficulty. The patient tolerated the                            procedure well. Scope In: Scope Out: Findings:      ENDOSONOGRAPHIC FINDING: :      Multiple 15-20 mm hypoechoic round well-defined lesions were found in       the left lobe of the liver. The lesions were hypoechoic. Fine needle       aspiration for cytology was performed on a representative one,       minimizing path needle needed to tranverse normal-appearing liver. Color       Doppler imaging was utilized prior to needle puncture to confirm a lack       of significant vascular structures within the needle path. Two passes       were made with the 22 gauge needle using a transgastric approach. A       stylet was used. A cytologist was present and performed a preliminary       cytologic examination. Final cytology results are pending.      No lymphadenopathy seen.      There was no sign of significant endosonographic abnormality in the       pancreatic head, genu of the pancreas and uncinate process of the       pancreas.      An irregular mass was identified in the pancreatic body. The mass was       hypoechoic. The  mass measured 31 mm by 22 mm in maximal cross-sectional       diameter. The endosonographic borders were poorly-defined. There was       peripancreatic edema and irregularity around the anterior margin of the       pancreas. Unable to discern clear vascular invasion. The remainder of       the pancreas was examined. The endosonographic appearance of parenchyma       and the upstream pancreatic duct indicated duct dilation.  Fine needle       aspiration for cytology was performed. Color Doppler imaging was       utilized prior to needle puncture to confirm a lack of significant       vascular structures within the needle path. Three passes were made with       the 25 gauge needle using a transgastric approach; this was a separate       needle than the one used for the liver lesion biopsy. A stylet was used.       A cytologist was present and performed a preliminary cytologic       examination. Final cytology results are pending. Impression:               - A lesion was found in the left lobe of the liver.                            The lesion was hypoechoic. Fine needle aspiration                            performed. Worrisome for metastatic process.                           - There was no sign of significant pathology in the                            pancreatic head, genu of the pancreas and uncinate                            process of the pancreas.                           - A mass was identified at the junction of the                            pancreatic body/tail. This was staged T3 N0 Mx by                            endosonographic criteria. Fine needle aspiration                            performed. Moderate Sedation:      Not Applicable - Patient had care per Anesthesia. Recommendation:           - Discharge patient to home (via wheelchair).                           - Resume previous diet today.                           -  Continue present medications.                           - Await cytology results.                           - Return to GI clinic (date not yet determined). Procedure Code(s):        --- Professional ---                           [phone], Esophagogastroduodenoscopy, flexible,                            transoral; with transendoscopic ultrasound-guided                            intramural or transmural fine needle                            aspiration/biopsy(s) (includes  endoscopic                            ultrasound examination of the esophagus, stomach,                            and either the duodenum or a surgically altered                            stomach where the jejunum is examined distal to the                            anastomosis) Diagnosis Code(s):        --- Professional ---                           K76.9, Liver disease, unspecified                           K86.89, Other specified diseases of pancreas                           R10.12, Left upper quadrant pain                           R63.4, Abnormal weight loss                           R93.2, Abnormal findings on diagnostic imaging of                            liver and biliary tract CPT copyright 2022 [employer]. All rights reserved. The codes documented in this report are preliminary and upon coder review may  be revised to meet current compliance requirements. Elsie Cree, MD 07/04/2024 10:57:28 AM This report has been signed electronically. Number of Addenda: 0

## 2024-07-04 NOTE — Transfer of Care (Signed)
 Immediate Anesthesia Transfer of Care Note  Patient: Jo Allen  Procedure(s) Performed: ULTRASOUND, UPPER GI TRACT, ENDOSCOPIC  Patient Location: Endoscopy Unit  Anesthesia Type:MAC  Level of Consciousness: awake, alert , oriented, and patient cooperative  Airway & Oxygen Therapy: Patient Spontanous Breathing and Patient connected to face mask oxygen  Post-op Assessment: Report given to RN and Post -op Vital signs reviewed and stable  Post vital signs: Reviewed and stable  Last Vitals:  Vitals Value Taken Time  BP 167/93 07/04/24 10:45  Temp    Pulse 85 07/04/24 10:47  Resp 22 07/04/24 10:47  SpO2 100 % 07/04/24 10:47  Vitals shown include unfiled device data.  Last Pain:  Vitals:   07/04/24 0858  TempSrc: Temporal  PainSc: 0-No pain         Complications: No notable events documented.

## 2024-07-04 NOTE — Anesthesia Postprocedure Evaluation (Signed)
 Anesthesia Post Note  Patient: [name]  Procedure(s) Performed: ULTRASOUND, UPPER GI TRACT, ENDOSCOPIC     Patient location during evaluation: PACU Anesthesia Type: MAC Level of consciousness: awake and alert Pain management: pain level controlled Vital Signs Assessment: post-procedure vital signs reviewed and stable Respiratory status: spontaneous breathing Cardiovascular status: stable Anesthetic complications: no   No notable events documented.  Last Vitals:  Vitals:   07/04/24 1110 07/04/24 1120  BP: (!) 177/88 (!) 164/91  Pulse: 74 72  Resp: 13 12  Temp:    SpO2: 98% 98%    Last Pain:  Vitals:   07/04/24 1120  TempSrc:   PainSc: 7                  Antron Seth [employer]

## 2024-07-04 NOTE — H&P (Signed)
 [facility] H/P Note  Chief Complaint: abnormal MRI pancreas  HPI: Jo Allen is an 58 y.o. female.  History of pancreatitis.  History of possible small lesion tail of pancreas.  Ongoing mild abdominal discomfort, worse after eating rich/greasy foods.  10 lb weight loss.  Ca 19-9 normal.  MRI recently reviewed showing subtle 1.1 x 0.8 cm abnormality at junction of body/tail pancreas.  Past Medical History:  Diagnosis Date   Asthma    CAD (coronary artery disease)    GERD (gastroesophageal reflux disease)    Headache    Heart disease    Hypertension    Obesity    Paresthesia 08/20/2015   Prediabetes    Transfusion history    9yrs ago    Past Surgical History:  Procedure Laterality Date   ANGIOPLASTY     BREAST BIOPSY Left    BREAST BIOPSY WITH SENTINEL LYMPH NODE BIOPSY AND NEEDLE LOCALIZATION Left    COLONOSCOPY WITH PROPOFOL  N/A 02/19/2014   Procedure: COLONOSCOPY WITH PROPOFOL ;  Surgeon: Jo MARLA Louder, MD;  Location: [facility] ENDOSCOPY;  Service: Endoscopy;  Laterality: N/A;   JOINT REPLACEMENT Bilateral    LEFT HEART CATH AND CORONARY ANGIOGRAPHY N/A 04/29/2023   Procedure: LEFT HEART CATH AND CORONARY ANGIOGRAPHY;  Surgeon: Jo Heinz, MD;  Location: [facility] INVASIVE CV [facility];  Service: Cardiovascular;  Laterality: N/A;   TOTAL KNEE ARTHROPLASTY Bilateral     Medications Prior to Admission  Medication Sig Dispense Refill   amLODipine  (NORVASC ) 10 MG tablet Take 1 tablet (10 mg total) by mouth daily. 90 tablet 1   Biotin  1000 MCG CHEW Chew 2,000 mcg by mouth daily.     buPROPion  (WELLBUTRIN  XL) 150 MG 24 hr tablet Take 150 mg by mouth daily.     Cholecalciferol (VITAMIN D3) 50 MCG (2000 UT) capsule Take 4,000 Units by mouth daily.     CRANBERRY PO Take 1 tablet by mouth daily.     cyclobenzaprine  (FLEXERIL ) 10 MG tablet Take 1 tablet (10 mg total) by mouth 2 (two) times daily as needed for muscle spasms. (Patient taking differently: Take 10 mg by mouth daily.) 20 tablet  0   ferrous sulfate  325 (65 FE) MG EC tablet Take 325 mg by mouth daily.     gabapentin  (NEURONTIN ) 100 MG capsule Take 100 mg by mouth every 8 (eight) hours.     lamoTRIgine  (LAMICTAL ) 200 MG tablet Take 200 mg by mouth 2 (two) times daily.     levocetirizine (XYZAL) 5 MG tablet Take 5 mg by mouth daily.     lipase/protease/amylase (CREON ) 36000 UNITS CPEP capsule Take 1 capsule (36,000 Units total) by mouth 3 (three) times daily with meals. 200 capsule 0   Magnesium Gluconate (MAGNESIUM 27 PO) Take 1 tablet by mouth daily.     meloxicam (MOBIC) 15 MG tablet Take 15 mg by mouth daily.     metoprolol  succinate (TOPROL  XL) 50 MG 24 hr tablet Take 1 tablet (50 mg total) by mouth daily. 90 tablet 1   ondansetron  (ZOFRAN ) 4 MG tablet Take 1 tablet (4 mg total) by mouth every 6 (six) hours as needed for nausea. 20 tablet 0   QUEtiapine  (SEROQUEL ) 100 MG tablet Take 100 mg by mouth at bedtime.     tiZANidine  (ZANAFLEX ) 2 MG tablet Take 1-2 tablets (2-4 mg total) by mouth every 6 (six) hours as needed for muscle spasms. (Patient taking differently: Take 1 mg by mouth at bedtime.) 30 tablet 0   vitamin B-12 (CYANOCOBALAMIN )  1000 MCG tablet Take 1,000 mcg by mouth daily.     zaleplon (SONATA) 5 MG capsule Take 5 mg by mouth at bedtime.     albuterol  (VENTOLIN  HFA) 108 (90 Base) MCG/ACT inhaler Inhale 1-2 puffs into the lungs every 6 (six) hours as needed for wheezing or shortness of breath. (Patient taking differently: Inhale 2 puffs into the lungs every 6 (six) hours as needed for wheezing or shortness of breath.) 1 each 0   hydrOXYzine (ATARAX) 50 MG tablet Take 50 mg by mouth at bedtime. (Patient not taking: Reported on 07/04/2024)     nitroGLYCERIN  (NITROSTAT ) 0.4 MG SL tablet Place 1 tablet (0.4 mg total) under the tongue every 5 (five) minutes as needed for chest pain. If you require more than two tablets five minutes apart go to the nearest ER via EMS. (Patient not taking: Reported on 04/19/2024) 30  tablet 0   oxyCODONE  (OXY IR/ROXICODONE ) 5 MG immediate release tablet Take 1 tablet (5 mg total) by mouth every 6 (six) hours as needed for moderate pain (pain score 4-6) or severe pain (pain score 7-10). 15 tablet 0    Allergies:  Allergies  Allergen Reactions   Morphine  And Codeine Nausea And Vomiting, Hives and Itching   Trazodone Nausea Only    dizziness, headache   Penicillins Hives   Tramadol      Upset stomach    Family History  Problem Relation Age of Onset   Hypertension Mother    Diabetes Mother    Stroke Father    Headache Sister    Depression Brother    Breast cancer Neg Hx     Social History:  reports that she has never smoked. She has never used smokeless tobacco. She reports that she does not drink alcohol and does not use drugs.   ROS: As per HPI, all others negative   Blood pressure 136/64, pulse 77, temperature 97.8 F (36.6 C), temperature source Temporal, resp. rate 16, height 5' 4 (1.626 m), weight 102.1 kg, SpO2 96%. General appearance: NAD, overweight HEENT:  [place]/AT, anicteric LUNGS:  No visible distress CV:  No tachycardia ABD:  Soft, mild upper abdominal tenderness (ongoing for weeks/months) NEURO:  No encephalopathy  No results found for this or any previous visit (from the past 48 hours). No results found.  Assessment/Plan   Idiopathic pancreatitis. Abnormal MRI pancreas:  small subtle abnormality 1.1 x 0.8 cm at body/tail junction. Upper endoscopy ultrasound with possible fine needle aspiration. Risks (bleeding, infection, bowel perforation that could require surgery, sedation-related changes in cardiopulmonary systems), benefits (identification and possible treatment of source of symptoms, exclusion of certain causes of symptoms), and alternatives (watchful waiting, radiographic imaging studies, empiric medical treatment) of upper endoscopy with ultrasound and possible fine needle aspiration (EUS +/- FNA) were explained to patient/family in  detail and patient wishes to proceed.   Jo Allen 07/04/2024, 9:48 AM

## 2024-07-04 NOTE — Discharge Instructions (Signed)

## 2024-07-05 ENCOUNTER — Encounter (HOSPITAL_COMMUNITY): Payer: Self-pay | Admitting: Gastroenterology

## 2024-07-10 DIAGNOSIS — F3132 Bipolar disorder, current episode depressed, moderate: Secondary | ICD-10-CM | POA: Diagnosis not present

## 2024-07-13 DIAGNOSIS — R897 Abnormal histological findings in specimens from other organs, systems and tissues: Secondary | ICD-10-CM | POA: Diagnosis not present

## 2024-07-20 DIAGNOSIS — K219 Gastro-esophageal reflux disease without esophagitis: Secondary | ICD-10-CM | POA: Diagnosis not present

## 2024-07-20 DIAGNOSIS — I2510 Atherosclerotic heart disease of native coronary artery without angina pectoris: Secondary | ICD-10-CM | POA: Diagnosis not present

## 2024-07-20 DIAGNOSIS — M5441 Lumbago with sciatica, right side: Secondary | ICD-10-CM | POA: Diagnosis not present

## 2024-07-20 DIAGNOSIS — F329 Major depressive disorder, single episode, unspecified: Secondary | ICD-10-CM | POA: Diagnosis not present

## 2024-07-20 DIAGNOSIS — I10 Essential (primary) hypertension: Secondary | ICD-10-CM | POA: Diagnosis not present

## 2024-07-20 DIAGNOSIS — J4530 Mild persistent asthma, uncomplicated: Secondary | ICD-10-CM | POA: Diagnosis not present

## 2024-07-20 DIAGNOSIS — E782 Mixed hyperlipidemia: Secondary | ICD-10-CM | POA: Diagnosis not present

## 2024-07-20 DIAGNOSIS — R7303 Prediabetes: Secondary | ICD-10-CM | POA: Diagnosis not present

## 2024-07-24 LAB — CYTOLOGY - NON PAP

## 2024-07-26 ENCOUNTER — Inpatient Hospital Stay

## 2024-07-26 ENCOUNTER — Encounter: Payer: Self-pay | Admitting: Oncology

## 2024-07-26 ENCOUNTER — Inpatient Hospital Stay: Attending: Oncology | Admitting: Oncology

## 2024-07-26 VITALS — BP 114/59 | HR 79 | Temp 97.4°F | Resp 18 | Ht 64.0 in | Wt 216.9 lb

## 2024-07-26 DIAGNOSIS — C7A8 Other malignant neuroendocrine tumors: Secondary | ICD-10-CM | POA: Diagnosis not present

## 2024-07-26 DIAGNOSIS — G893 Neoplasm related pain (acute) (chronic): Secondary | ICD-10-CM | POA: Diagnosis not present

## 2024-07-26 DIAGNOSIS — R9389 Abnormal findings on diagnostic imaging of other specified body structures: Secondary | ICD-10-CM | POA: Diagnosis not present

## 2024-07-26 DIAGNOSIS — C252 Malignant neoplasm of tail of pancreas: Secondary | ICD-10-CM | POA: Diagnosis not present

## 2024-07-26 DIAGNOSIS — C7B8 Other secondary neuroendocrine tumors: Secondary | ICD-10-CM

## 2024-07-26 DIAGNOSIS — R978 Other abnormal tumor markers: Secondary | ICD-10-CM

## 2024-07-26 DIAGNOSIS — Z79899 Other long term (current) drug therapy: Secondary | ICD-10-CM | POA: Insufficient documentation

## 2024-07-26 LAB — CBC WITH DIFFERENTIAL (CANCER CENTER ONLY)
Abs Immature Granulocytes: 0.01 K/uL (ref 0.00–0.07)
Basophils Absolute: 0 K/uL (ref 0.0–0.1)
Basophils Relative: 1 %
Eosinophils Absolute: 0.1 K/uL (ref 0.0–0.5)
Eosinophils Relative: 3 %
HCT: 40.4 % (ref 36.0–46.0)
Hemoglobin: 12.7 g/dL (ref 12.0–15.0)
Immature Granulocytes: 0 %
Lymphocytes Relative: 46 %
Lymphs Abs: 2 K/uL (ref 0.7–4.0)
MCH: 26.2 pg (ref 26.0–34.0)
MCHC: 31.4 g/dL (ref 30.0–36.0)
MCV: 83.3 fL (ref 80.0–100.0)
Monocytes Absolute: 0.4 K/uL (ref 0.1–1.0)
Monocytes Relative: 9 %
Neutro Abs: 1.7 K/uL (ref 1.7–7.7)
Neutrophils Relative %: 41 %
Platelet Count: 242 K/uL (ref 150–400)
RBC: 4.85 MIL/uL (ref 3.87–5.11)
RDW: 13.2 % (ref 11.5–15.5)
WBC Count: 4.2 K/uL (ref 4.0–10.5)
nRBC: 0 % (ref 0.0–0.2)

## 2024-07-26 LAB — CMP (CANCER CENTER ONLY)
ALT: 19 U/L (ref 0–44)
AST: 23 U/L (ref 15–41)
Albumin: 4.2 g/dL (ref 3.5–5.0)
Alkaline Phosphatase: 197 U/L — ABNORMAL HIGH (ref 38–126)
Anion gap: 5 (ref 5–15)
BUN: 10 mg/dL (ref 6–20)
CO2: 32 mmol/L (ref 22–32)
Calcium: 10.1 mg/dL (ref 8.9–10.3)
Chloride: 105 mmol/L (ref 98–111)
Creatinine: 0.66 mg/dL (ref 0.44–1.00)
GFR, Estimated: >60 mL/min (ref >60)
Glucose, Bld: 107 mg/dL — ABNORMAL HIGH (ref 70–99)
Potassium: 3.6 mmol/L (ref 3.5–5.1)
Sodium: 142 mmol/L (ref 135–145)
Total Bilirubin: 0.3 mg/dL (ref 0.0–1.2)
Total Protein: 7.6 g/dL (ref 6.5–8.1)

## 2024-07-26 LAB — TSH: TSH: 1.58 u[IU]/mL (ref 0.350–4.500)

## 2024-07-26 LAB — CEA (ACCESS): CEA (CHCC): 5.47 ng/mL — ABNORMAL HIGH (ref 0.00–5.00)

## 2024-07-26 LAB — LACTATE DEHYDROGENASE: LDH: 146 U/L (ref 98–192)

## 2024-07-26 NOTE — Progress Notes (Signed)
 PATIENT NAVIGATOR PROGRESS NOTE  Name: Jo Allen Date: 07/26/2024 MRN: [id number]  DOB: 1965-11-26   Reason for visit:  Initial Med/Onc visit with Dr. Chinita Pasam  Comments: Patient was seen in office during her initial visit with Dr. Autumn.  Patient was given my direct contact information and was instructed to contact office with any questions or concerns.   Referral entered for Social Work.   Time spent counseling/coordinating care: > 60 minutes

## 2024-07-26 NOTE — Progress Notes (Signed)
 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  Biopsies from pancreatic tail lesion showed malignant cells.  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 Immunohistochemical stains were performed to characterize the tumor cells. The cells are positive for CK7, PAX8, TTF-1, synaptophysin and chromogranin A. The cells are negative for CK20, thyroglobulin, CDX2, GATA3, CK5/6, p40, D2-40, calretinin, WT1, HepPar 1, and Glypican 3. P53 is mutant type of staining. Ki67 proliferation index is approximately 50%.  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 The differential diagnosis includes metastatic high-grade neuroendocrine carcinoma from elsewhere in the body including pancreas (favored), gastrointestinal tract, lung, gynecological system, pancreatic  acinar cell carcinoma or other types of carcinomas with neuroendocrine differentiation (solid pseudopapillary tumor with malignant transformation).   Today I discussed the diagnosis, prognosis, plan of care, treatment options with the patient and her son who was accompanying.  Reviewed NCCN guidelines and discussed management options.  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  Recommended palliative systemic chemotherapy with carboplatin and etoposide. She has expressed reservations about chemotherapy, preferring to rely on spiritual beliefs for healing. Without chemotherapy, the disease is expected to progress and worsen, potentially affecting other organs. Surgery is not feasible due to multiple liver metastases. She will discuss further with rest of the family members and her pastor before making final decision.  On her consultation with us  on 07/26/2024, request admitted for staging PET scan to look for any other evidence of disease.  I will plan to see her with PET scan results.  Will also discuss her case in GI tumor conference, once PET scan results are available.  Chronic pain related to malignancy Experiences chronic pain associated with the malignancy, particularly in the abdominal area. Reports intermittent aching and burning sensations, managed with positioning and heat application. Cautious about using pain medications due to side effects experienced with previous medications. - Recommend use of heating pad for pain relief - Advise use of Aleve  for pain management, ensuring adequate hydration - Avoid Tylenol  until liver function is assessed  Musculoskeletal back pain Reports musculoskeletal back pain, managed with positioning and the use of a back pillow. Uses tizanidine , a muscle relaxant, to manage muscle tightness and cramping. - Continue use of tizanidine  as needed for muscle  relaxation - Consider alternative pain management strategies if current regimen is insufficient  I reviewed lab results and outside records for this visit and discussed relevant results with the patient. Diagnosis, plan of care and treatment options were also discussed  in detail with the patient. Opportunity provided to ask questions and answers provided to her apparent satisfaction. Provided instructions to call our clinic with any problems, questions or concerns prior to return visit. I recommended to continue follow-up with PCP and sub-specialists. She verbalized understanding and agreed with the plan. No barriers to learning was detected.  NCCN guidelines have been consulted in the planning of this patient's care.  Chinita Patten, MD  07/26/2024 7:38 PM  [facility] - A DEPT OF [facility] [address] [place] [address] Dept: [phone] Dept Fax: [phone]   HISTORY OF PRESENTING ILLNESS:   I have reviewed her chart and materials related to her cancer extensively and collaborated history with the patient. Summary of oncologic history is as follows:  ONCOLOGY HISTORY:  Patient was hospitalized when she presented to the ED on 04/19/2024 with complaints of abdominal pain.  CT abdomen and pelvis on 04/19/2024 showed intra and Perry pancreatic edema involving the pancreatic tail with thrombosis of the splenic vein around the tail of the pancreas.  Imaging features were compatible with acute pancreatitis.  14 mm ill-defined hypoattenuating focus in the tail of the pancreas may reflect focal edema.  MRI abdomen was recommended for further evaluation.  On 04/19/2024, MRI of the abdomen showed edematous appearance of the pancreatic tail with extensive peripancreatic and retroperitoneal fat stranding, consistent with acute pancreatitis.  In the central pancreatic tail there was a focal, hypoenhancing lesion measuring 1.1 x 0.8  cm.  Pancreatic duct was focally effaced in this vicinity.  No upstream pancreatic ductal dilatation.  Numerous tiny diffusion restricting foci scattered throughout the liver, some of the largest of which are with faint associated hyperenhancement and early arterial phase.  Constellation of the findings were suspicious for small pancreatic adenocarcinoma underlying acute pancreatitis with liver metastasis.  Close follow-up was recommended.  Patient was evaluated by GI during this hospitalization.  Once her pain was adequately controlled, she was discharged on 04/27/2024 with outpatient GI follow-up.  Upper EUS on 07/04/2024, performed by Dr. Burnette.  It showed multiple 15 to 20 mm hypoechoic, round well-defined lesions in the left lobe of liver, FNA performed.  There was no significant pathology in the pancreatic head, genu of the pancreas and uncinate process of the pancreas.  Irregular hypoechoic mass was identified at the junction of pancreatic body/tail, measuring 31 mm x 22 mm.  Stage-T3, N0 by endosonographic criteria.  There was evidence of pancreatic ductal dilation.  Biopsies from pancreatic tail lesion showed malignant cells.  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 Immunohistochemical stains were performed to characterize the tumor cells. The cells are positive for CK7, PAX8, TTF-1, synaptophysin and chromogranin A. The cells are negative for CK20, thyroglobulin, CDX2, GATA3, CK5/6, p40, D2-40, calretinin, WT1, HepPar 1, and Glypican 3. P53 is mutant type of staining. Ki67 proliferation index is approximately 50%.  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 The differential diagnosis includes metastatic high-grade neuroendocrine carcinoma from elsewhere in the body including pancreas (favored), gastrointestinal tract, lung, gynecological system, pancreatic acinar cell  carcinoma or other types of carcinomas with neuroendocrine differentiation (solid pseudopapillary tumor with malignant transformation). Additional workups will be performed at [facility] including INSM-1 to confirm neuroendocrine lineage, BCL10 and Chymotrypsin to exclude acinar cell carcinoma, LEF-1 and beta-catenin to exclude solid pseudopapillary tumor with malignant transformation (less likely). The results will be  reported in an addendum.   Addendum # 1: Based on the morphologic and immunophenotypic features, a metastatic papillary thyroid  carcinoma cannot be excluded. Correlation with imaging findings is recommended.   Addendum # 2: Additional immunohistochemical stains were performed in outside institution to further classify the tumor. Beta-catenin is negative for nuclear stain. LEF-1 and INSM1 show weak but relatively diffuse staining.  BCL10 and trypsin show weakly nuclear and cytoplasmic staining without typical granular staining. Chymotrypsin is negative. The immunoprofile is non-specific and non-conclusive. Final classification should be rendered in the excisional specimen.  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  Recommended palliative systemic chemotherapy with carboplatin and etoposide.  Patient has reservations against chemotherapy and is not considering chemotherapy at this time but she will discuss further with rest of the family members and her pastor before making final decision.  On her consultation with us  on 07/26/2024, request admitted for staging PET scan to look for any other evidence of disease.  INTERVAL HISTORY:  Discussed the use of AI scribe software for clinical note transcription with the patient, who gave verbal consent to proceed.  History of Present Illness Jo Allen is a 58 year old female with a neuroendocrine tumor presumed pancreatic origin, who  presents for oncology consultation regarding her advanced stage cancer. She is accompanied by her son.  In July, she experienced significant pain on her side, severe enough to prevent her from lying on that side, and was admitted to the hospital. Initial imaging, including a CT scan and an MRI, revealed a tumor in the pancreas and nonspecific findings in the liver. Subsequent endoscopic ultrasound and biopsies identified a neuroendocrine tumor in the body and tail of the pancreas, which had increased in size from half an inch to slightly over an inch. The ultrasound also revealed multiple spots in the liver, which were biopsied.  Her pain is manageable and influenced by her diet, with heavy foods causing a sensation of swelling. She uses a heating pad and a special pillow for comfort due to occasional aching and burning on her side. Current medications include Creon  for digestion and tizanidine , a muscle relaxant, at a dose of 4 mg, which she splits in half as needed. She has a known allergy to tramadol , which causes stomach upset.  She does not smoke or drink alcohol. 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    MEDICAL HISTORY:  Past Medical History:  Diagnosis Date   Asthma    CAD (coronary artery disease)    GERD (gastroesophageal reflux disease)    Headache    Heart disease    Hypertension    Obesity    Paresthesia 08/20/2015   Prediabetes    Transfusion history    30yrs ago    SURGICAL HISTORY: Past Surgical History:  Procedure Laterality Date   ANGIOPLASTY     BREAST BIOPSY Left    BREAST BIOPSY WITH SENTINEL LYMPH NODE BIOPSY AND NEEDLE LOCALIZATION Left    COLONOSCOPY WITH PROPOFOL  N/A 02/19/2014   Procedure: COLONOSCOPY WITH PROPOFOL ;  Surgeon: Gladis MARLA Louder, MD;  Location: [facility];  Service: Endoscopy;  Laterality: N/A;   ESOPHAGOGASTRODUODENOSCOPY N/A 07/04/2024    Procedure: EGD (ESOPHAGOGASTRODUODENOSCOPY);  Surgeon: Burnette Fallow, MD;  Location: [facility];  Service: Gastroenterology;  Laterality: N/A;   EUS N/A 07/04/2024   Procedure: ULTRASOUND, UPPER GI TRACT,  ENDOSCOPIC;  Surgeon: Burnette Fallow, MD;  Location: [facility];  Service: Gastroenterology;  Laterality: N/A;   FINE NEEDLE ASPIRATION  07/04/2024   Procedure: FINE NEEDLE ASPIRATION;  Surgeon: Burnette Fallow, MD;  Location: [facility];  Service: Gastroenterology;;   JOINT REPLACEMENT Bilateral    LEFT HEART CATH AND CORONARY ANGIOGRAPHY N/A 04/29/2023   Procedure: LEFT HEART CATH AND CORONARY ANGIOGRAPHY;  Surgeon: Ladona Heinz, MD;  Location: [facility];  Service: Cardiovascular;  Laterality: N/A;   TOTAL KNEE ARTHROPLASTY Bilateral     SOCIAL HISTORY: She reports that she has never smoked. She has never used smokeless tobacco. She reports that she does not drink alcohol and does not use drugs. Social History   Socioeconomic History   Marital status: Single    Spouse name: Not on file   Number of children: Not on file   Years of education: Not on file   Highest education level: Not on file  Occupational History   Not on file  Tobacco Use   Smoking status: Never   Smokeless tobacco: Never  Vaping Use   Vaping status: Never Used  Substance and Sexual Activity   Alcohol use: No   Drug use: No   Sexual activity: Not Currently  Other Topics Concern   Not on file  Social History Narrative   Not on file   Social Drivers of Health   Financial Resource Strain: Low Risk  (12/19/2023)   Received from [facility]   Overall Financial Resource Strain (CARDIA)    Difficulty of Paying Living Expenses: Not hard at all  Food Insecurity: No Food Insecurity (07/26/2024)   Hunger Vital Sign    Worried About Running Out of Food in the Last Year: Never true    Ran Out of Food in the Last Year: Never true  Transportation Needs: No Transportation Needs (07/26/2024)   PRAPARE -  Administrator, Civil Service (Medical): No    Lack of Transportation (Non-Medical): No  Physical Activity: Unknown (11/02/2022)   Received from [facility]   Exercise Vital Sign    On average, how many days per week do you engage in moderate to strenuous exercise (like a brisk walk)?: Patient declined    On average, how many minutes do you engage in exercise at this level?: 0 min  Stress: Stress Concern Present (11/02/2022)   Received from [facility] - Occupational Stress Questionnaire    Feeling of Stress : To some extent  Social Connections: Socially Integrated (11/02/2022)   Received from [facility]   Social Network    How would you rate your social network (family, work, friends)?: Good participation with social networks  Intimate Partner Violence: Not At Risk (07/26/2024)   Humiliation, Afraid, Rape, and Kick questionnaire    Fear of Current or Ex-Partner: No    Emotionally Abused: No    Physically Abused: No    Sexually Abused: No    FAMILY HISTORY: Family History  Problem Relation Age of Onset   Hypertension Mother    Diabetes Mother    Stroke Father    Headache Sister    Depression Brother    Breast cancer Neg Hx     ALLERGIES:  She is allergic to morphine  and codeine, trazodone, penicillins, and tramadol .  MEDICATIONS:  Current Outpatient Medications  Medication Sig Dispense Refill   albuterol  (VENTOLIN  HFA) 108 (90 Base) MCG/ACT inhaler Inhale 1-2 puffs into the lungs every 6 (  six) hours as needed for wheezing or shortness of breath. 1 each 0   amLODipine  (NORVASC ) 10 MG tablet Take 1 tablet (10 mg total) by mouth daily. 90 tablet 1   Biotin  1000 MCG CHEW Chew 2,000 mcg by mouth daily.     buPROPion  (WELLBUTRIN  XL) 150 MG 24 hr tablet Take 150 mg by mouth daily.     Cholecalciferol (VITAMIN D3) 50 MCG (2000 UT) capsule Take 4,000 Units by mouth daily.     CRANBERRY PO Take 1 tablet by mouth daily.      cyclobenzaprine  (FLEXERIL ) 10 MG tablet Take 1 tablet (10 mg total) by mouth 2 (two) times daily as needed for muscle spasms. (Patient taking differently: Take 10 mg by mouth daily.) 20 tablet 0   ferrous sulfate  325 (65 FE) MG EC tablet Take 325 mg by mouth daily.     gabapentin  (NEURONTIN ) 100 MG capsule Take 100 mg by mouth every 8 (eight) hours.     lamoTRIgine  (LAMICTAL ) 200 MG tablet Take 200 mg by mouth 2 (two) times daily.     levocetirizine (XYZAL) 5 MG tablet Take 5 mg by mouth daily.     lipase/protease/amylase (CREON ) 36000 UNITS CPEP capsule Take 1 capsule (36,000 Units total) by mouth 3 (three) times daily with meals. 200 capsule 0   Magnesium Gluconate (MAGNESIUM 27 PO) Take 1 tablet by mouth daily.     meloxicam (MOBIC) 15 MG tablet Take 15 mg by mouth daily.     metoprolol  succinate (TOPROL  XL) 50 MG 24 hr tablet Take 1 tablet (50 mg total) by mouth daily. 90 tablet 1   ondansetron  (ZOFRAN ) 4 MG tablet Take 1 tablet (4 mg total) by mouth every 6 (six) hours as needed for nausea. 20 tablet 0   QUEtiapine  (SEROQUEL ) 100 MG tablet Take 100 mg by mouth at bedtime.     tiZANidine  (ZANAFLEX ) 2 MG tablet Take 1-2 tablets (2-4 mg total) by mouth every 6 (six) hours as needed for muscle spasms. 30 tablet 0   vitamin B-12 (CYANOCOBALAMIN ) 1000 MCG tablet Take 1,000 mcg by mouth daily.     zaleplon (SONATA) 5 MG capsule Take 5 mg by mouth at bedtime.     nitroGLYCERIN  (NITROSTAT ) 0.4 MG SL tablet Place 1 tablet (0.4 mg total) under the tongue every 5 (five) minutes as needed for chest pain. If you require more than two tablets five minutes apart go to the nearest ER via EMS. (Patient not taking: Reported on 07/26/2024) 30 tablet 0   No current facility-administered medications for this visit.    REVIEW OF SYSTEMS:    Review of Systems - Oncology  All other pertinent systems were reviewed with the patient and are negative.  PHYSICAL EXAMINATION:   Onc Performance Status - 07/26/24 1115        ECOG Perf Status   ECOG Perf Status Fully active, able to carry on all pre-disease performance without restriction      KPS SCALE   KPS % SCORE Normal, no compliants, no evidence of disease          Vitals:   07/26/24 1103  BP: (!) 114/59  Pulse: 79  Resp: 18  Temp: (!) 97.4 F (36.3 C)  SpO2: 100%   Filed Weights   07/26/24 1103  Weight: 216 lb 14.4 oz (98.4 kg)    Physical Exam Constitutional:      General: She is not in acute distress.    Appearance: Normal appearance.  HENT:  Head: Normocephalic and atraumatic.  Cardiovascular:     Rate and Rhythm: Normal rate.     Heart sounds: Normal heart sounds.  Pulmonary:     Effort: Pulmonary effort is normal. No respiratory distress.     Breath sounds: Normal breath sounds.  Abdominal:     General: There is no distension.     Palpations: Abdomen is soft.  Neurological:     General: No focal deficit present.     Mental Status: She is alert and oriented to person, place, and time.  Psychiatric:     Comments: Very emotional during the interview with intermittent tearing.      LABORATORY DATA:   I have reviewed the data as listed.  Results for orders placed or performed in visit on 07/26/24  TSH  Result Value Ref Range   TSH 1.580 0.350 - 4.500 uIU/mL  CEA (Access)  Result Value Ref Range   CEA ([facility]) 5.47 (H) 0.00 - 5.00 ng/mL  Lactate dehydrogenase  Result Value Ref Range   LDH 146 98 - 192 U/L  CMP (Cancer Center only)  Result Value Ref Range   Sodium 142 135 - 145 mmol/L   Potassium 3.6 3.5 - 5.1 mmol/L   Chloride 105 98 - 111 mmol/L   CO2 32 22 - 32 mmol/L   Glucose, Bld 107 (H) 70 - 99 mg/dL   BUN 10 6 - 20 mg/dL   Creatinine 9.33 9.55 - 1.00 mg/dL   Calcium  10.1 8.9 - 10.3 mg/dL   Total Protein 7.6 6.5 - 8.1 g/dL   Albumin 4.2 3.5 - 5.0 g/dL   AST 23 15 - 41 U/L   ALT 19 0 - 44 U/L   Alkaline Phosphatase 197 (H) 38 - 126 U/L   Total Bilirubin 0.3 0.0 - 1.2 mg/dL   GFR, Estimated  >39 >39 mL/min   Anion gap 5 5 - 15  CBC with Differential (Cancer Center Only)  Result Value Ref Range   WBC Count 4.2 4.0 - 10.5 K/uL   RBC 4.85 3.87 - 5.11 MIL/uL   Hemoglobin 12.7 12.0 - 15.0 g/dL   HCT 59.5 63.9 - 53.9 %   MCV 83.3 80.0 - 100.0 fL   MCH 26.2 26.0 - 34.0 pg   MCHC 31.4 30.0 - 36.0 g/dL   RDW 86.7 88.4 - 84.4 %   Platelet Count 242 150 - 400 K/uL   nRBC 0.0 0.0 - 0.2 %   Neutrophils Relative % 41 %   Neutro Abs 1.7 1.7 - 7.7 K/uL   Lymphocytes Relative 46 %   Lymphs Abs 2.0 0.7 - 4.0 K/uL   Monocytes Relative 9 %   Monocytes Absolute 0.4 0.1 - 1.0 K/uL   Eosinophils Relative 3 %   Eosinophils Absolute 0.1 0.0 - 0.5 K/uL   Basophils Relative 1 %   Basophils Absolute 0.0 0.0 - 0.1 K/uL   Immature Granulocytes 0 %   Abs Immature Granulocytes 0.01 0.00 - 0.07 K/uL    RADIOGRAPHIC STUDIES:  I have personally reviewed the radiological images as listed and agree with the findings in the report.  MR 3D Recon At Scanner CLINICAL DATA:  Pancreatitis, rule out pancreatic mass suspected by prior CT  EXAM: MRI ABDOMEN WITHOUT AND WITH CONTRAST  TECHNIQUE: Multiplanar multisequence MR imaging of the abdomen was performed both before and after the administration of intravenous contrast.  CONTRAST:  9mL GADAVIST  GADOBUTROL  1 MMOL/ML IV SOLN  COMPARISON:  CT abdomen pelvis, 03/20/2024  FINDINGS: Lower chest: No acute abnormality.  Hepatobiliary: Hepatomegaly, maximum coronal span 20.2 cm. Hepatic steatosis. Numerous tiny diffusion restricting foci scattered throughout the liver, some of the largest of which are with faint associated hyperenhancement on early arterial phase (series 14, image 26, 29, series 8, image 11). No gallstones, gallbladder wall thickening, or biliary dilatation.  Pancreas: Edematous appearance of the pancreatic tail with extensive peripancreatic and retroperitoneal fat stranding. In the central pancreatic tail there is a focal,  hypoenhancing lesion corresponding to findings of prior CT measuring 1.1 x 0.8 cm (series 14, image 51). The pancreatic duct is focally effaced in this vicinity. No upstream pancreatic ductal dilatation.  Spleen: Normal in size without significant abnormality.  Adrenals/Urinary Tract: Adrenal glands are unremarkable. Kidneys are normal, without renal calculi, solid lesion, or hydronephrosis.  Stomach/Bowel: Stomach is within normal limits. No evidence of bowel wall thickening, distention, or inflammatory changes.  Vascular/Lymphatic: No significant vascular findings are present. No enlarged abdominal lymph nodes.  Other: No abdominal wall hernia or abnormality. No ascites.  Musculoskeletal: No acute or significant osseous findings.  IMPRESSION: 1. Edematous appearance of the pancreatic tail with extensive peripancreatic and retroperitoneal fat stranding, consistent with acute pancreatitis. 2. In the central pancreatic tail there is a focal, hypoenhancing lesion corresponding to findings of prior CT measuring 1.1 x 0.8 cm. The pancreatic duct is focally effaced in this vicinity. No upstream pancreatic ductal dilatation. 3. Numerous tiny diffusion restricting foci scattered throughout the liver, some of the largest of which are with faint associated hyperenhancement on early arterial phase. 4. 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 Appearance could however be explained by focal pancreatic parenchymal necrosis and developing acute pancreatic fluid collection as well as the incidental presence of very small focal nodular hyperplasias or other benign liver lesions. Recommend close follow-up to exclude malignancy. 5. Hepatomegaly and hepatic steatosis.  Electronically Signed   By: Marolyn JONETTA Jaksch M.D.   On: 04/19/2024 13:16 MR ABDOMEN W WO CONTRAST CLINICAL DATA:  Pancreatitis,  rule out pancreatic mass suspected by prior CT  EXAM: MRI ABDOMEN WITHOUT AND WITH CONTRAST  TECHNIQUE: Multiplanar multisequence MR imaging of the abdomen was performed both before and after the administration of intravenous contrast.  CONTRAST:  9mL GADAVIST  GADOBUTROL  1 MMOL/ML IV SOLN  COMPARISON:  CT abdomen pelvis, 03/20/2024  FINDINGS: Lower chest: No acute abnormality.  Hepatobiliary: Hepatomegaly, maximum coronal span 20.2 cm. Hepatic steatosis. Numerous tiny diffusion restricting foci scattered throughout the liver, some of the largest of which are with faint associated hyperenhancement on early arterial phase (series 14, image 26, 29, series 8, image 11). No gallstones, gallbladder wall thickening, or biliary dilatation.  Pancreas: Edematous appearance of the pancreatic tail with extensive peripancreatic and retroperitoneal fat stranding. In the central pancreatic tail there is a focal, hypoenhancing lesion corresponding to findings of prior CT measuring 1.1 x 0.8 cm (series 14, image 51). The pancreatic duct is focally effaced in this vicinity. No upstream pancreatic ductal dilatation.  Spleen: Normal in size without significant abnormality.  Adrenals/Urinary Tract: Adrenal glands are unremarkable. Kidneys are normal, without renal calculi, solid lesion, or hydronephrosis.  Stomach/Bowel: Stomach is within normal limits. No evidence of bowel wall thickening, distention, or inflammatory changes.  Vascular/Lymphatic: No significant vascular findings are present. No enlarged abdominal lymph nodes.  Other: No abdominal wall hernia or abnormality. No ascites.  Musculoskeletal: No acute or significant osseous findings.  IMPRESSION: 1. Edematous appearance  of the pancreatic tail with extensive peripancreatic and retroperitoneal fat stranding, consistent with acute pancreatitis. 2. In the central pancreatic tail there is a focal, hypoenhancing lesion  corresponding to findings of prior CT measuring 1.1 x 0.8 cm. The pancreatic duct is focally effaced in this vicinity. No upstream pancreatic ductal dilatation. 3. Numerous tiny diffusion restricting foci scattered throughout the liver, some of the largest of which are with faint associated hyperenhancement on early arterial phase. 4. 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 Appearance could however be explained by focal pancreatic parenchymal necrosis and developing acute pancreatic fluid collection as well as the incidental presence of very small focal nodular hyperplasias or other benign liver lesions. Recommend close follow-up to exclude malignancy. 5. Hepatomegaly and hepatic steatosis.  Electronically Signed   By: Marolyn JONETTA Jaksch M.D.   On: 04/19/2024 13:16 CT ABDOMEN PELVIS W CONTRAST CLINICAL DATA:  Left lower quadrant abdominal pain. Pain and discomfort under left breast, epigastric area, and left lower back.  EXAM: CT ABDOMEN AND PELVIS WITH CONTRAST  TECHNIQUE: Multidetector CT imaging of the abdomen and pelvis was performed using the standard protocol following bolus administration of intravenous contrast.  RADIATION DOSE REDUCTION: This exam was performed according to the departmental dose-optimization program which includes automated exposure control, adjustment of the mA and/or kV according to patient size and/or use of iterative reconstruction technique.  CONTRAST:  OMNIPAQUE  IOHEXOL  300 MG/ML  SOLN  COMPARISON:  02/03/2024  FINDINGS: Lower chest: Unremarkable.  Hepatobiliary: No suspicious focal abnormality within the liver parenchyma. There is no evidence for gallstones, gallbladder wall thickening, or pericholecystic fluid. No intrahepatic or extrahepatic biliary dilation.  Pancreas: Intra and peripancreatic edema noted in the pancreatic tail.  No main duct dilatation. 14 mm ill-defined hypoattenuating focus in the tail of pancreas (24/2) may reflect focal edema. Pancreatic parenchyma enhances throughout. No focal peripancreatic fluid collection.  Spleen: No splenomegaly. No suspicious focal mass lesion. 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  Adrenals/Urinary Tract: No adrenal nodule or mass. Kidneys unremarkable. No evidence for hydroureter. Bladder is decompressed which accentuates wall thickness.  Stomach/Bowel: Stomach is unremarkable. No gastric wall thickening. No evidence of outlet obstruction. Duodenum is normally positioned as is the ligament of Treitz. No small bowel wall thickening. No small bowel dilatation. The terminal ileum is normal. The appendix is not well visualized, but there is no edema or inflammation in the region of the cecal tip to suggest appendicitis. No gross colonic mass. No colonic wall thickening.  Vascular/Lymphatic: There is mild atherosclerotic calcification of the abdominal aorta without aneurysm. There is no gastrohepatic or hepatoduodenal ligament lymphadenopathy. No retroperitoneal or mesenteric lymphadenopathy. No pelvic sidewall lymphadenopathy.  Reproductive: Unremarkable.  Other: No intraperitoneal free fluid.  Musculoskeletal: No worrisome lytic or sclerotic osseous abnormality.  IMPRESSION: 1. Intra and peripancreatic edema involving the pancreatic tail with thrombosis of the splenic vein along the tail the pancreas. Imaging features compatible with acute pancreatitis. No focal peripancreatic fluid collection. 2. 14 mm ill-defined hypoattenuating focus in the tail of pancreas may reflect focal edema. Consider follow-up MRI abdomen with and without contrast to exclude underlying parenchymal lesion. 3.  Aortic Atherosclerosis (ICD10-I70.0).  Electronically Signed   By: Camellia Candle M.D.   On: 04/19/2024 08:05 DG Chest Port 1 View CLINICAL DATA:   Shortness of breath and left chest pain  EXAM: PORTABLE CHEST 1 VIEW  COMPARISON:  03/24/2023  FINDINGS: The lungs  are clear without focal pneumonia, edema, pneumothorax or pleural effusion. Cardiopericardial silhouette is at upper limits of normal for size. No acute bony abnormality.  IMPRESSION: No active disease.  Electronically Signed   By: Camellia Candle M.D.   On: 04/19/2024 06:18    Orders Placed This Encounter  Procedures   NM PET Image Initial (PI) Skull Base To Thigh    Standing Status:   Future    Expected Date:   07/31/2024    Expiration Date:   07/26/2025    If indicated for the ordered procedure, I authorize the administration of a radiopharmaceutical per Radiology protocol:   Yes    Is the patient pregnant?:   No    Preferred imaging location?:   [place]   CBC with Differential (Cancer Center Only)    Standing Status:   Future    Number of Occurrences:   1    Expiration Date:   07/26/2025   CMP (Cancer Center only)    Standing Status:   Future    Number of Occurrences:   1    Expiration Date:   07/26/2025   Lactate dehydrogenase    Standing Status:   Future    Number of Occurrences:   1    Expiration Date:   07/26/2025   Chromogranin A    Standing Status:   Future    Number of Occurrences:   1    Expiration Date:   07/26/2025   Cancer antigen 19-9    Standing Status:   Future    Number of Occurrences:   1    Expiration Date:   07/26/2025   CEA (Access)    Standing Status:   Future    Number of Occurrences:   1    Expiration Date:   07/26/2025   TSH    Standing Status:   Future    Number of Occurrences:   1    Expiration Date:   07/26/2025    CODE STATUS:  Code Status History     Date Active Date Inactive Code Status Order ID Comments User Context   04/19/2024 1025 04/27/2024 1655 Full Code [id number]  Zella Katha HERO, MD ED   04/29/2023 0847 04/30/2023 1450 Full Code [id number]  Ladona Heinz, MD Inpatient    Questions for Most Recent Historical  Code Status (Order [id number])     Question Answer   By: Consent: discussion documented in EHR            Future Appointments  Date Time Provider Department Center  08/03/2024  9:00 AM Ashiah Karpowicz, [place], MD [facility] None  08/10/2024  9:20 AM Karis Clunes, MD [facility] None  08/13/2024  9:00 AM [facility] PET CT 1 [facility] [place]  08/20/2024 10:00 AM [facility] MM 3 [facility]     I spent a total of 75 minutes during this encounter with the patient including review of chart and various tests results, discussions about plan of care and coordination of care plan.  This document was completed utilizing speech recognition software. Grammatical errors, random word insertions, pronoun errors, and incomplete sentences are an occasional consequence of this system due to software limitations, ambient noise, and hardware issues. Any formal questions or concerns about the content, text or information contained within the body of this dictation should be directly addressed to the provider for clarification.

## 2024-07-26 NOTE — Assessment & Plan Note (Addendum)
 Please review oncology history for additional details and timeline of events.  Biopsies from pancreatic tail lesion showed malignant cells.  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 Immunohistochemical stains were performed to characterize the tumor cells. The cells are positive for CK7, PAX8, TTF-1, synaptophysin and chromogranin A. The cells are negative for CK20, thyroglobulin, CDX2, GATA3, CK5/6, p40, D2-40, calretinin, WT1, HepPar 1, and Glypican 3. P53 is mutant type of staining. Ki67 proliferation index is approximately 50%.  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 The differential diagnosis includes metastatic high-grade neuroendocrine carcinoma from elsewhere in the body including pancreas (favored), gastrointestinal tract, lung, gynecological system, pancreatic acinar cell carcinoma or other types of carcinomas with neuroendocrine differentiation (solid pseudopapillary tumor with malignant transformation).   Today I discussed the diagnosis, prognosis, plan of care, treatment options with the patient and her son who was accompanying.  Reviewed NCCN guidelines and discussed management options.  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  Recommended palliative systemic chemotherapy with carboplatin and etoposide. She has expressed reservations about chemotherapy, preferring to rely on spiritual beliefs for healing. Without chemotherapy, the disease is expected to progress and worsen, potentially affecting other organs. Surgery is not feasible due to multiple liver metastases. She will discuss further with rest of the family members and her pastor before making final decision.  On her consultation with us  on 07/26/2024, request admitted for staging PET scan to look for any  other evidence of disease.  I will plan to see her with PET scan results.  Will also discuss her case in GI tumor conference, once PET scan results are available.

## 2024-07-27 ENCOUNTER — Telehealth: Payer: Self-pay

## 2024-07-27 LAB — CANCER ANTIGEN 19-9: CA 19-9: 4 U/mL (ref 0–35)

## 2024-07-27 NOTE — Telephone Encounter (Signed)
 [facility] Clinical Social Work  Clinical Social Work was referred by Statistician for assessment of psychosocial needs.  Clinical Social Worker attempted to contact patient by phone to offer support and assess for needs.  Per navigator, Patient's son is requesting caregiver support. CSW attempted to reach patient first to go new patient assessment. Call went straight to voicemail.  Lizbeth Sprague, LCSW  Clinical Social Worker [facility]

## 2024-07-28 LAB — CHROMOGRANIN A: Chromogranin A (ng/mL): 98.5 ng/mL (ref 0.0–101.8)

## 2024-07-30 ENCOUNTER — Telehealth: Payer: Self-pay

## 2024-07-30 NOTE — Telephone Encounter (Signed)
 [facility] Clinical Social Work  Clinical Social Work was referred by Statistician for emotional support and caregiver/family issues.  Clinical Social Worker attempted to contact patient by phone to offer support and assess for needs.  Patient's call went straight to VM. CSW made contact with patient's son, appt scheduled for 10/17  Lizbeth Sprague, LCSW  Clinical Social Worker [facility]

## 2024-08-02 ENCOUNTER — Telehealth: Payer: Self-pay | Admitting: Oncology

## 2024-08-02 ENCOUNTER — Telehealth: Payer: Self-pay

## 2024-08-02 DIAGNOSIS — E782 Mixed hyperlipidemia: Secondary | ICD-10-CM | POA: Diagnosis not present

## 2024-08-02 DIAGNOSIS — I10 Essential (primary) hypertension: Secondary | ICD-10-CM | POA: Diagnosis not present

## 2024-08-02 DIAGNOSIS — F329 Major depressive disorder, single episode, unspecified: Secondary | ICD-10-CM | POA: Diagnosis not present

## 2024-08-02 DIAGNOSIS — I2510 Atherosclerotic heart disease of native coronary artery without angina pectoris: Secondary | ICD-10-CM | POA: Diagnosis not present

## 2024-08-02 DIAGNOSIS — J4530 Mild persistent asthma, uncomplicated: Secondary | ICD-10-CM | POA: Diagnosis not present

## 2024-08-02 DIAGNOSIS — M5441 Lumbago with sciatica, right side: Secondary | ICD-10-CM | POA: Diagnosis not present

## 2024-08-02 DIAGNOSIS — R7303 Prediabetes: Secondary | ICD-10-CM | POA: Diagnosis not present

## 2024-08-02 DIAGNOSIS — K219 Gastro-esophageal reflux disease without esophagitis: Secondary | ICD-10-CM | POA: Diagnosis not present

## 2024-08-02 NOTE — Telephone Encounter (Signed)
 Moved patients appointment from 1/17 to 10/22. Called and spoke with the patient, she is aware.

## 2024-08-02 NOTE — Telephone Encounter (Signed)
 [facility] CSW Progress Note  Clinical Social Worker attempted to reach patient. 10/17 appt moved to 10/22 following provider appointment. CSW left VM    Lizbeth Sprague, LCSW Clinical Social Worker [facility]

## 2024-08-03 ENCOUNTER — Inpatient Hospital Stay

## 2024-08-03 ENCOUNTER — Inpatient Hospital Stay: Admitting: Oncology

## 2024-08-06 ENCOUNTER — Encounter (HOSPITAL_COMMUNITY): Admission: RE | Admit: 2024-08-06 | Source: Ambulatory Visit

## 2024-08-07 ENCOUNTER — Telehealth: Payer: Self-pay | Admitting: Oncology

## 2024-08-07 NOTE — Telephone Encounter (Signed)
 I spoke with patient regarding rescheduled appointment to see Dr. Autumn 1 week after PET SCAN on 10/29 instead of 10/22.

## 2024-08-08 ENCOUNTER — Inpatient Hospital Stay

## 2024-08-08 ENCOUNTER — Inpatient Hospital Stay: Admitting: Oncology

## 2024-08-08 ENCOUNTER — Other Ambulatory Visit: Payer: Self-pay

## 2024-08-08 ENCOUNTER — Encounter (HOSPITAL_COMMUNITY)
Admission: RE | Admit: 2024-08-08 | Discharge: 2024-08-08 | Disposition: A | Discharge status: Home / Self Care | Source: Ambulatory Visit | Attending: Oncology | Admitting: Oncology

## 2024-08-08 DIAGNOSIS — K769 Liver disease, unspecified: Secondary | ICD-10-CM | POA: Diagnosis not present

## 2024-08-08 DIAGNOSIS — C7A8 Other malignant neuroendocrine tumors: Secondary | ICD-10-CM | POA: Insufficient documentation

## 2024-08-08 DIAGNOSIS — G893 Neoplasm related pain (acute) (chronic): Secondary | ICD-10-CM | POA: Diagnosis not present

## 2024-08-08 DIAGNOSIS — C252 Malignant neoplasm of tail of pancreas: Secondary | ICD-10-CM | POA: Diagnosis not present

## 2024-08-08 DIAGNOSIS — C7B8 Other secondary neuroendocrine tumors: Secondary | ICD-10-CM | POA: Insufficient documentation

## 2024-08-08 DIAGNOSIS — Z79899 Other long term (current) drug therapy: Secondary | ICD-10-CM | POA: Diagnosis not present

## 2024-08-08 LAB — GLUCOSE, CAPILLARY: Glucose-Capillary: 108 mg/dL — ABNORMAL HIGH (ref 70–99)

## 2024-08-08 MED ORDER — FLUDEOXYGLUCOSE F - 18 (FDG) INJECTION
10.7000 | Freq: Once | INTRAVENOUS | Status: AC | PRN
  Administered 2024-08-08: 10.7 via INTRAVENOUS

## 2024-08-08 NOTE — Progress Notes (Signed)
 [facility] CSW Progress Note  Patient called Clinical Social Worker on this date. Patient arrived at cancer center on this date for originally scheduled appointment. CSW adjusted this appointment on 10/21 due to the cancellation of her provider appointment. Patient was not aware. Patient arrived at [facility] and was not given opportunity to see CSW due to cancelled appt. Patient called CSW after leaving. CSW apologized for not providing enough notice of cancelled appointment. CSW provided guidance if patient needs to CSW in future and appt is not scheduled. Patient has CSW's contact. Appointment has not been rescheduled.    Jo Sprague, LCSW Clinical Social Worker [facility]

## 2024-08-08 NOTE — Progress Notes (Signed)
 The proposed treatment discussed in conference is for discussion purpose only and is not a binding recommendation.  The patients have not been physically examined, or presented with their treatment options.  Therefore, final treatment plans cannot be decided.

## 2024-08-10 ENCOUNTER — Ambulatory Visit (INDEPENDENT_AMBULATORY_CARE_PROVIDER_SITE_OTHER): Admitting: Otolaryngology

## 2024-08-13 ENCOUNTER — Other Ambulatory Visit (HOSPITAL_COMMUNITY)

## 2024-08-14 ENCOUNTER — Other Ambulatory Visit: Payer: Self-pay | Admitting: Oncology

## 2024-08-14 DIAGNOSIS — C7B8 Other secondary neuroendocrine tumors: Secondary | ICD-10-CM

## 2024-08-14 NOTE — Progress Notes (Signed)
 [facility]  ONCOLOGY CLINIC PROGRESS NOTE   Patient Care Team: [name], [name] as PCP - General (Physician Assistant) Ardis, Evalene CROME, RN as Oncology Nurse Navigator Burnette Fallow, MD as Consulting Physician (Gastroenterology) Autumn Millman, MD as Consulting Physician (Oncology)  PATIENT NAME: Jo Allen   MR#: [id number] DOB: 01/07/1966  Date of visit: 08/15/2024   ASSESSMENT & PLAN:   Jo Allen is a 58 y.o. 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   Primary malignant neuroendocrine tumor of pancreas [facility]) Please review oncology history for additional details and timeline of events.  Biopsies from pancreatic tail lesion showed malignant cells.  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 Immunohistochemical stains were performed to characterize the tumor cells. The cells are positive for CK7, PAX8, TTF-1, synaptophysin and chromogranin A. The cells are negative for CK20, thyroglobulin, CDX2, GATA3, CK5/6, p40, D2-40, calretinin, WT1, HepPar 1, and Glypican 3. P53 is mutant type of staining. Ki67 proliferation index is approximately 50%.  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 The differential diagnosis includes metastatic high-grade neuroendocrine carcinoma from elsewhere in the body including pancreas (favored), gastrointestinal tract, lung, gynecological system, pancreatic acinar cell carcinoma or other types of carcinomas with neuroendocrine differentiation (solid pseudopapillary tumor with malignant transformation).   Previously I discussed the diagnosis, prognosis, plan of care,  treatment options with the patient and her son who was accompanying.  Reviewed NCCN guidelines and discussed management options.  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  Recommended palliative systemic chemotherapy with cisplatin and etoposide. She has previously expressed reservations about chemotherapy, preferring to rely on spiritual beliefs for healing. Without chemotherapy, the disease is expected to progress and worsen, potentially affecting other organs. Surgery is not feasible due to multiple liver metastases. She discussed this further with rest of the family members and her pastor before making final decision.  On her consultation with us  on 07/26/2024, we submitted request for staging PET scan.  On 08/08/2024, PET scan showed multiple hepatic lesions with intense metabolic activity, progressed from July 2025, consistent with metastatic disease.  Hypermetabolic mass in the pancreatic body measuring approximately 4.3 x 2.8 cm, presumed primary malignancy.  No hypermetabolic abdominal lymph nodes.  No lung, skeletal mets.  Patient is now agreeable to proceeding with systemic treatments.  Discussed treatment options after reviewing NCCN guidelines.  Plan made to proceed with cisplatin and etoposide.  Cisplatin will be given on day 1, etoposide on days 1-3 of 21-day cycle.  Plan to continue for 6-8 cycles with restaging imaging after every 3 cycles.  Will arrange for chemo education, Port-A-Cath placement and plan to begin treatments in the next week or so.  First cycle may have to be given using peripheral IV, if there is a long delay in Port-A-Cath placement.  Cancer associated pain Intermittent pain managed with Aleve  and heat pads. Tramadol  discussed as an option, with past upset stomach but no allergy. - Prescribe tramadol , to be taken up to four times a day    I reviewed lab results  and outside records for this visit and discussed relevant results with the patient. Diagnosis, plan of care and treatment options were also discussed in detail with the  patient. Opportunity provided to ask questions and answers provided to her apparent satisfaction. Provided instructions to call our clinic with any problems, questions or concerns prior to return visit. I recommended to continue follow-up with PCP and sub-specialists. She verbalized understanding and agreed with the plan.   NCCN guidelines have been consulted in the planning of this patient's care.  I spent a total of 45 minutes during this encounter with the patient including review of chart and various tests results, discussions about plan of care and coordination of care plan.   Chinita Patten, MD  08/15/2024 1:56 PM  [facility] - A DEPT OF [facility] 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  INTERVAL HISTORY:    Discussed the use of AI scribe software for clinical note transcription with the patient, who gave verbal consent to proceed.  History of Present Illness Jo Allen is a 58 year old female with a neuroendocrine tumor who presents for follow-up on her condition and management of pain. She is accompanied by her son and multiple family members were on the phone.  She experiences pain related to her neuroendocrine tumor, which has metastasized to her liver and pancreas. The pain is currently managed with Aleve  taken occasionally, and she has not been using prescription pain medication. She is open to trying tramadol  if needed. No current  pain issues are reported, and no new symptoms are present.  A recent PET scan showed progression of her disease with increased size of liver lesions and a pancreatic mass now measuring 4.3 by 2.8 cm. Previous imaging in July showed smaller lesions, indicating growth over the past three months.  Her blood work shows normal counts, but there is an increase in alkaline phosphatase levels, now at 321, up from 197, indicating liver involvement. Other liver function tests and kidney function are within normal limits.  She is currently taking several medications including Aleve  for pain, tizanidine  as a muscle relaxant, blood pressure medication, Seroquel  for sleep, meloxicam for inflammation, and gabapentin . She has a history of an upset stomach with tramadol , but no other significant allergies were noted.     I have reviewed the past medical history, past surgical history, social history and family history with the patient and they are unchanged from previous note.  HISTORY OF PRESENT ILLNESS:   ONCOLOGY HISTORY:   Patient was hospitalized when she presented to the ED on 04/19/2024 with complaints of abdominal pain.   CT abdomen and pelvis on 04/19/2024 showed intra and Perry pancreatic edema involving the pancreatic tail with thrombosis of the splenic vein around the tail of the pancreas.  Imaging features were compatible with acute pancreatitis.  14 mm ill-defined hypoattenuating focus in the tail of the pancreas may reflect focal edema.  MRI abdomen was recommended for further evaluation.   On 04/19/2024, MRI of the abdomen showed edematous appearance of the pancreatic tail with extensive peripancreatic and retroperitoneal fat stranding, consistent with acute pancreatitis.  In the central pancreatic tail there was a focal, hypoenhancing lesion measuring 1.1 x 0.8 cm.  Pancreatic duct was focally effaced in this vicinity.  No upstream pancreatic ductal dilatation.  Numerous tiny diffusion restricting foci  scattered throughout the liver, some of the largest of which are with faint associated hyperenhancement and early arterial phase.  Constellation of the findings were suspicious for small pancreatic adenocarcinoma underlying acute pancreatitis with liver metastasis.  Close follow-up was recommended.   Patient was evaluated by GI during this hospitalization.  Once her pain was adequately controlled, she was discharged on 04/27/2024 with outpatient GI follow-up.   Upper EUS on 07/04/2024, performed by Dr. Burnette.  It showed multiple 15 to 20 mm hypoechoic, round well-defined lesions in the left lobe of liver, FNA performed.  There was no significant pathology in the pancreatic head, genu of the pancreas and uncinate process of the pancreas.  Irregular hypoechoic mass was identified at the junction of pancreatic body/tail, measuring 31 mm x 22 mm.  Stage-T3, N0 by endosonographic criteria.  There was evidence of pancreatic ductal dilation.   Biopsies from pancreatic tail lesion showed malignant cells.  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 Immunohistochemical stains were performed to characterize the tumor cells. The cells are positive for CK7, PAX8, TTF-1, synaptophysin and chromogranin A. The cells are negative for CK20, thyroglobulin, CDX2, GATA3, CK5/6, p40, D2-40, calretinin, WT1, HepPar 1, and Glypican 3. P53 is mutant type of staining. Ki67 proliferation index is approximately 50%.  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 The differential diagnosis includes metastatic high-grade neuroendocrine carcinoma from elsewhere in the body including pancreas (favored), gastrointestinal tract, lung, gynecological system, pancreatic acinar cell carcinoma or other types of carcinomas with neuroendocrine differentiation (solid pseudopapillary tumor with malignant transformation). Additional  workups will be performed at [facility] including INSM-1 to confirm neuroendocrine lineage, BCL10 and Chymotrypsin to exclude acinar cell carcinoma, LEF-1 and beta-catenin to exclude solid pseudopapillary tumor with malignant transformation (less likely). The results will be reported in an addendum.    Addendum # 1: Based on the morphologic and immunophenotypic features, a metastatic papillary thyroid  carcinoma cannot be excluded. Correlation with imaging findings is recommended.    Addendum # 2: Additional immunohistochemical stains were performed in outside institution to further classify the tumor. Beta-catenin is negative for nuclear stain. LEF-1 and INSM1 show weak but relatively diffuse staining.  BCL10 and trypsin show weakly nuclear and cytoplasmic staining without typical granular staining. Chymotrypsin is negative. The immunoprofile is non-specific and non-conclusive. Final classification should be rendered in the excisional specimen.   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  Recommended palliative systemic chemotherapy with carboplatin and etoposide.  Patient has reservations against chemotherapy and is not considering chemotherapy at this time but she will discuss further with rest of the family members and her pastor before making final decision.   On her consultation with us  on 07/26/2024, we submitted request for staging PET scan.  On 08/08/2024, PET scan showed multiple hepatic lesions with intense metabolic activity, progressed from July 2025, consistent with metastatic disease.  Hypermetabolic mass in the pancreatic body measuring approximately 4.3 x 2.8 cm, presumed primary malignancy.  No hypermetabolic abdominal lymph nodes.  No lung, skeletal mets.  Patient is now agreeable to proceeding with systemic treatments.  Discussed treatment options after reviewing NCCN guidelines.   Plan made to proceed with cisplatin and etoposide.  Cisplatin will be given on day 1, etoposide on days 1-3 of 21-day cycle.  Plan to continue for 6-8 cycles with restaging imaging after every 3 cycles.   REVIEW OF SYSTEMS:   Review  of Systems - Oncology  All other pertinent systems were reviewed with the patient and are negative.  ALLERGIES: She is allergic to morphine  and codeine, trazodone, and penicillins.  MEDICATIONS:  Current Outpatient Medications  Medication Sig Dispense Refill   traMADol  (ULTRAM ) 50 MG tablet Take 1 tablet (50 mg total) by mouth every 6 (six) hours as needed. 90 tablet 0   albuterol  (VENTOLIN  HFA) 108 (90 Base) MCG/ACT inhaler Inhale 1-2 puffs into the lungs every 6 (six) hours as needed for wheezing or shortness of breath. 1 each 0   amLODipine  (NORVASC ) 10 MG tablet Take 1 tablet (10 mg total) by mouth daily. 90 tablet 1   Biotin  1000 MCG CHEW Chew 2,000 mcg by mouth daily.     buPROPion  (WELLBUTRIN  XL) 150 MG 24 hr tablet Take 150 mg by mouth daily.     Cholecalciferol (VITAMIN D3) 50 MCG (2000 UT) capsule Take 4,000 Units by mouth daily.     CRANBERRY PO Take 1 tablet by mouth daily.     cyclobenzaprine  (FLEXERIL ) 10 MG tablet Take 1 tablet (10 mg total) by mouth 2 (two) times daily as needed for muscle spasms. (Patient taking differently: Take 10 mg by mouth daily.) 20 tablet 0   dexamethasone  (DECADRON ) 4 MG tablet Take for 1 day starting the day after chemotherapy on day 4. Take with food. 30 tablet 1   ferrous sulfate  325 (65 FE) MG EC tablet Take 325 mg by mouth daily.     gabapentin  (NEURONTIN ) 100 MG capsule Take 100 mg by mouth every 8 (eight) hours.     lamoTRIgine  (LAMICTAL ) 200 MG tablet Take 200 mg by mouth 2 (two) times daily.     levocetirizine (XYZAL) 5 MG tablet Take 5 mg by mouth daily.     lidocaine -prilocaine (EMLA) cream Apply to affected area once 30 g 3   lipase/protease/amylase (CREON ) 36000 UNITS CPEP capsule Take 1 capsule (36,000  Units total) by mouth 3 (three) times daily with meals. 200 capsule 0   Magnesium Gluconate (MAGNESIUM 27 PO) Take 1 tablet by mouth daily.     meloxicam (MOBIC) 15 MG tablet Take 15 mg by mouth daily.     metoprolol  succinate (TOPROL  XL) 50 MG 24 hr tablet Take 1 tablet (50 mg total) by mouth daily. 90 tablet 1   nitroGLYCERIN  (NITROSTAT ) 0.4 MG SL tablet Place 1 tablet (0.4 mg total) under the tongue every 5 (five) minutes as needed for chest pain. If you require more than two tablets five minutes apart go to the nearest ER via EMS. (Patient not taking: Reported on 07/26/2024) 30 tablet 0   ondansetron  (ZOFRAN ) 4 MG tablet Take 1 tablet (4 mg total) by mouth every 6 (six) hours as needed for nausea. 20 tablet 0   ondansetron  (ZOFRAN ) 8 MG tablet Take 1 tablet (8 mg total) by mouth every 8 (eight) hours as needed for nausea or vomiting. Start on the third day after cisplatin. 30 tablet 1   prochlorperazine (COMPAZINE) 10 MG tablet Take 1 tablet (10 mg total) by mouth every 6 (six) hours as needed for nausea or vomiting. 30 tablet 1   QUEtiapine  (SEROQUEL ) 100 MG tablet Take 100 mg by mouth at bedtime.     tiZANidine  (ZANAFLEX ) 2 MG tablet Take 1-2 tablets (2-4 mg total) by mouth every 6 (six) hours as needed for muscle spasms. 30 tablet 0   vitamin B-12 (CYANOCOBALAMIN ) 1000 MCG tablet Take 1,000 mcg by mouth daily.  zaleplon (SONATA) 5 MG capsule Take 5 mg by mouth at bedtime.     No current facility-administered medications for this visit.     VITALS:   Blood pressure 138/72, pulse 91, temperature 97.7 F (36.5 C), resp. rate 17, weight 210 lb 8 oz (95.5 kg), SpO2 99%.  Wt Readings from Last 3 Encounters:  08/15/24 210 lb 8 oz (95.5 kg)  07/26/24 216 lb 14.4 oz (98.4 kg)  07/04/24 225 lb (102.1 kg)    Body mass index is 36.13 kg/m.    Onc Performance Status - 08/15/24 1300       ECOG Perf Status   ECOG Perf Status Fully active, able to carry on all pre-disease performance  without restriction      KPS SCALE   KPS % SCORE Normal, no compliants, no evidence of disease          PHYSICAL EXAM:   Physical Exam Constitutional:      General: She is not in acute distress.    Appearance: Normal appearance.  HENT:     Head: Normocephalic and atraumatic.  Cardiovascular:     Rate and Rhythm: Normal rate.  Pulmonary:     Effort: Pulmonary effort is normal. No respiratory distress.  Abdominal:     General: There is no distension.  Neurological:     General: No focal deficit present.     Mental Status: She is alert and oriented to person, place, and time.  Psychiatric:        Mood and Affect: Mood normal.        Behavior: Behavior normal.       LABORATORY DATA:   I have reviewed the data as listed.  Results for orders placed or performed in visit on 08/15/24  CBC with Differential (Cancer Center Only)  Result Value Ref Range   WBC Count 5.4 4.0 - 10.5 K/uL   RBC 4.78 3.87 - 5.11 MIL/uL   Hemoglobin 12.5 12.0 - 15.0 g/dL   HCT 60.7 63.9 - 53.9 %   MCV 82.0 80.0 - 100.0 fL   MCH 26.2 26.0 - 34.0 pg   MCHC 31.9 30.0 - 36.0 g/dL   RDW 86.8 88.4 - 84.4 %   Platelet Count 264 150 - 400 K/uL   nRBC 0.0 0.0 - 0.2 %   Neutrophils Relative % 42 %   Neutro Abs 2.3 1.7 - 7.7 K/uL   Lymphocytes Relative 47 %   Lymphs Abs 2.6 0.7 - 4.0 K/uL   Monocytes Relative 8 %   Monocytes Absolute 0.5 0.1 - 1.0 K/uL   Eosinophils Relative 2 %   Eosinophils Absolute 0.1 0.0 - 0.5 K/uL   Basophils Relative 1 %   Basophils Absolute 0.0 0.0 - 0.1 K/uL   Immature Granulocytes 0 %   Abs Immature Granulocytes 0.02 0.00 - 0.07 K/uL  CMP (Cancer Center only)  Result Value Ref Range   Sodium 140 135 - 145 mmol/L   Potassium 3.8 3.5 - 5.1 mmol/L   Chloride 103 98 - 111 mmol/L   CO2 30 22 - 32 mmol/L   Glucose, Bld 117 (H) 70 - 99 mg/dL   BUN <5 (L) 6 - 20 mg/dL   Creatinine 9.28 9.55 - 1.00 mg/dL   Calcium  9.7 8.9 - 10.3 mg/dL   Total Protein 7.3 6.5 - 8.1 g/dL    Albumin 4.0 3.5 - 5.0 g/dL   AST 27 15 - 41 U/L   ALT 30 0 - 44 U/L  Alkaline Phosphatase 321 (H) 38 - 126 U/L   Total Bilirubin 0.4 0.0 - 1.2 mg/dL   GFR, Estimated >39 >39 mL/min   Anion gap 7 5 - 15  Lactate dehydrogenase  Result Value Ref Range   LDH 163 98 - 192 U/L      RADIOGRAPHIC STUDIES:  I have personally reviewed the radiological images as listed and agree with the findings in the report.  NM PET Image Initial (PI) Skull Base To Thigh Result Date: 08/11/2024 EXAM: PET AND CT SKULL BASE TO MID THIGH 08/08/2024 04:26:24 PM TECHNIQUE: RADIOPHARMACEUTICAL: 10.70 mCi F-18 FDG Uptake time 60 minutes. Glucose level 108 mg/dl. PET imaging was acquired from the base of the skull to the mid thighs. Non-contrast enhanced computed tomography was obtained for attenuation correction and anatomic localization. COMPARISON: MRI dated 04/19/2024. CLINICAL HISTORY: Poorly differentiated carcinoma noted on liver biopsy, neuroendocrine features. Needs staging. Table formatting from the original note was not included. 10.70 mCi F-18 FDG @ 1514 IV in RT HAND//BWM-KC. FBG: 108 mg/dL. 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 Needs staging. FINDINGS: HEAD AND NECK: No metabolically active cervical lymphadenopathy. CHEST: No metabolically active pulmonary nodules. No lung metastasis. No metabolically active lymphadenopathy. ABDOMEN AND PELVIS: There are multiple intense metabolic activity within the liver, dominant lesion in the central left hepatic lobe with SUV max equal to 10.1. This lesion is subtly evident on noncontrast CT measuring 18 mm on image 91. Additional lesion in the posterior left hepatic lobe on image 103. Additional lesion lateral segment of the left hepatic lobe on imaging 108. The lesions appear progressed from MRI April 19, 2024. Within the central body of the pancreas, there is an intense focus of metabolic activity with SUV  max equal to 9.3 image 108. This corresponds to focal enlargement of the mid body of the pancreas to 4.3 x 2.8 cm on image 109. There is interval resolution of the distal pancreatitis seen on comparison CT and MRI. No hypermetabolic abdominal lymph nodes are identified. The uterus and ovaries are normal. Physiologic activity within the gastrointestinal and genitourinary systems. BONES AND SOFT TISSUE: No abnormal FDG activity localizes to the bones. No metabolically active aggressive osseous lesion. IMPRESSION: 1. Hepatic lesions with intense metabolic activity, progressed from April 19, 2024 MRI, consistent with metastatic disease. 2. Hypermetabolic mass in the pancreatic body measuring approximately 4.3 x 2.8 cm. Presumed primary malignancy . 3. No hypermetabolic abdominal lymph nodes. 4. No skeletal metastasis. 5. No lung metastasis. 6. Interval resolution of distal pancreatitis. Electronically signed by: Norleen Boxer MD 08/11/2024 09:30 AM EDT RP Workstation: [id number]     CODE STATUS:  Code Status History     Date Active Date Inactive Code Status Order ID Comments User Context   04/19/2024 1025 04/27/2024 1655 Full Code [id number]  Zella Katha HERO, MD ED   04/29/2023 0847 04/30/2023 1450 Full Code [id number]  Ladona Heinz, MD Inpatient    Questions for Most Recent Historical Code Status (Order [id number])     Question Answer   By: Consent: discussion documented in EHR            Orders Placed This Encounter  Procedures   Consent Attestation for Oncology Treatment    The patient is informed of risks, benefits, side-effects of the prescribed oncology treatment. Potential short term and long term side effects and response rates discussed. After a long discussion, the patient made informed decision to proceed.:   Yes   CBC with  Differential (Cancer Center Only)    Standing Status:   Future    Expected Date:   08/22/2024    Expiration Date:   08/22/2025   CMP (Cancer Center only)    Standing  Status:   Future    Expected Date:   08/22/2024    Expiration Date:   08/22/2025   Magnesium    Standing Status:   Future    Expected Date:   08/22/2024    Expiration Date:   08/22/2025   CBC with Differential (Cancer Center Only)    Standing Status:   Future    Expected Date:   09/12/2024    Expiration Date:   09/12/2025   CMP (Cancer Center only)    Standing Status:   Future    Expected Date:   09/12/2024    Expiration Date:   09/12/2025   Magnesium    Standing Status:   Future    Expected Date:   09/12/2024    Expiration Date:   09/12/2025   CBC with Differential (Cancer Center Only)    Standing Status:   Future    Expected Date:   10/03/2024    Expiration Date:   10/03/2025   CMP (Cancer Center only)    Standing Status:   Future    Expected Date:   10/03/2024    Expiration Date:   10/03/2025   Magnesium    Standing Status:   Future    Expected Date:   10/03/2024    Expiration Date:   10/03/2025   PHYSICIAN COMMUNICATION ORDER    A baseline Audiogram is recommended prior to initiation of cisplatin chemotherapy.   ONCBCN PHYSICIAN COMMUNICATION 1    For pathway NEOS07 treatment x 4-6 cycles     Future Appointments  Date Time Provider Department Center  08/20/2024 10:00 AM [facility]      This document was completed utilizing speech recognition software. Grammatical errors, random word insertions, pronoun errors, and incomplete sentences are an occasional consequence of this system due to software limitations, ambient noise, and hardware issues. Any formal questions or concerns about the content, text or information contained within the body of this dictation should be directly addressed to the provider for clarification.

## 2024-08-15 ENCOUNTER — Encounter: Payer: Self-pay | Admitting: Oncology

## 2024-08-15 ENCOUNTER — Inpatient Hospital Stay

## 2024-08-15 ENCOUNTER — Encounter: Payer: Self-pay | Admitting: General Practice

## 2024-08-15 ENCOUNTER — Inpatient Hospital Stay: Admitting: Oncology

## 2024-08-15 VITALS — BP 138/72 | HR 91 | Temp 97.7°F | Resp 17 | Wt 210.5 lb

## 2024-08-15 DIAGNOSIS — C7A8 Other malignant neuroendocrine tumors: Secondary | ICD-10-CM

## 2024-08-15 DIAGNOSIS — Z79899 Other long term (current) drug therapy: Secondary | ICD-10-CM | POA: Diagnosis not present

## 2024-08-15 DIAGNOSIS — G893 Neoplasm related pain (acute) (chronic): Secondary | ICD-10-CM | POA: Diagnosis not present

## 2024-08-15 DIAGNOSIS — C252 Malignant neoplasm of tail of pancreas: Secondary | ICD-10-CM | POA: Diagnosis not present

## 2024-08-15 DIAGNOSIS — C7B8 Other secondary neuroendocrine tumors: Secondary | ICD-10-CM

## 2024-08-15 LAB — LACTATE DEHYDROGENASE: LDH: 163 U/L (ref 98–192)

## 2024-08-15 LAB — CBC WITH DIFFERENTIAL (CANCER CENTER ONLY)
Abs Immature Granulocytes: 0.02 K/uL (ref 0.00–0.07)
Basophils Absolute: 0 K/uL (ref 0.0–0.1)
Basophils Relative: 1 %
Eosinophils Absolute: 0.1 K/uL (ref 0.0–0.5)
Eosinophils Relative: 2 %
HCT: 39.2 % (ref 36.0–46.0)
Hemoglobin: 12.5 g/dL (ref 12.0–15.0)
Immature Granulocytes: 0 %
Lymphocytes Relative: 47 %
Lymphs Abs: 2.6 K/uL (ref 0.7–4.0)
MCH: 26.2 pg (ref 26.0–34.0)
MCHC: 31.9 g/dL (ref 30.0–36.0)
MCV: 82 fL (ref 80.0–100.0)
Monocytes Absolute: 0.5 K/uL (ref 0.1–1.0)
Monocytes Relative: 8 %
Neutro Abs: 2.3 K/uL (ref 1.7–7.7)
Neutrophils Relative %: 42 %
Platelet Count: 264 K/uL (ref 150–400)
RBC: 4.78 MIL/uL (ref 3.87–5.11)
RDW: 13.1 % (ref 11.5–15.5)
WBC Count: 5.4 K/uL (ref 4.0–10.5)
nRBC: 0 % (ref 0.0–0.2)

## 2024-08-15 LAB — CMP (CANCER CENTER ONLY)
ALT: 30 U/L (ref 0–44)
AST: 27 U/L (ref 15–41)
Albumin: 4 g/dL (ref 3.5–5.0)
Alkaline Phosphatase: 321 U/L — ABNORMAL HIGH (ref 38–126)
Anion gap: 7 (ref 5–15)
BUN: <5 mg/dL — ABNORMAL LOW (ref 6–20)
CO2: 30 mmol/L (ref 22–32)
Calcium: 9.7 mg/dL (ref 8.9–10.3)
Chloride: 103 mmol/L (ref 98–111)
Creatinine: 0.71 mg/dL (ref 0.44–1.00)
GFR, Estimated: >60 mL/min (ref >60)
Glucose, Bld: 117 mg/dL — ABNORMAL HIGH (ref 70–99)
Potassium: 3.8 mmol/L (ref 3.5–5.1)
Sodium: 140 mmol/L (ref 135–145)
Total Bilirubin: 0.4 mg/dL (ref 0.0–1.2)
Total Protein: 7.3 g/dL (ref 6.5–8.1)

## 2024-08-15 MED ORDER — LIDOCAINE-PRILOCAINE 2.5-2.5 % EX CREA
TOPICAL_CREAM | CUTANEOUS | 3 refills | Status: DC
Start: 2024-08-15 — End: 2024-08-26

## 2024-08-15 MED ORDER — DEXAMETHASONE 4 MG PO TABS: ORAL_TABLET | ORAL | 1 refills | Status: AC

## 2024-08-15 MED ORDER — PROCHLORPERAZINE MALEATE 10 MG PO TABS: 10.0000 mg | ORAL_TABLET | Freq: Four times a day (QID) | ORAL | 1 refills | Status: DC | PRN

## 2024-08-15 MED ORDER — ONDANSETRON HCL 8 MG PO TABS: 8.0000 mg | ORAL_TABLET | Freq: Three times a day (TID) | ORAL | 1 refills | Status: DC | PRN

## 2024-08-15 MED ORDER — TRAMADOL HCL 50 MG PO TABS: 50.0000 mg | ORAL_TABLET | Freq: Four times a day (QID) | ORAL | 0 refills | Status: DC | PRN

## 2024-08-15 NOTE — Progress Notes (Signed)
 SPIRITUAL CARE AND COUNSELING CONSULT NOTE   VISIT SUMMARY After speaking to Ms Teed' son by phone on a previous occasion, met Ms Hickam and Fawn in person in [facility] Patient and Family Support counseling room (with extended family members on video chat) after their time with Nadiyah Abdul/LCSW. Ms Wisnewski' faith and theological conviction that God is already healing her are central to her life and sense of purpose. This deep belief causes some friction with family members who are worried about her health. Chaplain reflected back that patients and families commonly experience variation in their feelings and beliefs, and that faith and treatment can work together as partners in public affairs consultant care.   SPIRITUAL ENCOUNTER                                                                                                                                                                      Type of Visit: Initial Care provided to:: Patient, Family (son [name] present for part of visit; additional family on video chat) Referral source: Social worker/Care management/TOC Reason for visit: Routine spiritual support   SPIRITUAL FRAMEWORK  Presenting Themes: Significant life change, Values and beliefs, Meaning/purpose/sources of inspiration, Impactful experiences and emotions, Courage hope and growth, Community and relationships Values/beliefs: God is already healing her; faith and theology are central to her life Community/Connection: Family Strengths: awareness of personal values Patient Stress Factors: Health changes   GOALS   Self/Personal Goals: Divine physical healing Clinical Care Goals: Spiritual/emotional support for patient and family, as desired   INTERVENTIONS   Spiritual Care Interventions Made: Established relationship of care and support, Compassionate presence, Reflective listening, Normalization of emotions, Explored values/beliefs/practices/strengths    INTERVENTION OUTCOMES    Outcomes: Connection to spiritual care, Connection to values and goals of care, Patient family open to resources  [facility] CARE PLAN   Spiritual Care Issues Still Outstanding: Chaplain will continue to follow    Chaplain Olam Filiberto Lemming, [facility] Pager [phone] Voicemail [phone]

## 2024-08-15 NOTE — Progress Notes (Signed)
 START ON PATHWAY REGIMEN - GI Neuroendocrine     A cycle is every 21 days:     Cisplatin      Etoposide   **Always confirm dose/schedule in your pharmacy ordering system**  Patient Characteristics: Pancreas, Poorly-differentiated, First Line Tumor Location: Pancreas Line of therapy: First Line Intent of Therapy: Non-Curative / Palliative Intent, Discussed with Patient

## 2024-08-15 NOTE — Assessment & Plan Note (Addendum)
 Please review oncology history for additional details and timeline of events.  Biopsies from pancreatic tail lesion showed malignant cells.  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 Immunohistochemical stains were performed to characterize the tumor cells. The cells are positive for CK7, PAX8, TTF-1, synaptophysin and chromogranin A. The cells are negative for CK20, thyroglobulin, CDX2, GATA3, CK5/6, p40, D2-40, calretinin, WT1, HepPar 1, and Glypican 3. P53 is mutant type of staining. Ki67 proliferation index is approximately 50%.  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 The differential diagnosis includes metastatic high-grade neuroendocrine carcinoma from elsewhere in the body including pancreas (favored), gastrointestinal tract, lung, gynecological system, pancreatic acinar cell carcinoma or other types of carcinomas with neuroendocrine differentiation (solid pseudopapillary tumor with malignant transformation).   Previously I discussed the diagnosis, prognosis, plan of care, treatment options with the patient and her son who was accompanying.  Reviewed NCCN guidelines and discussed management options.  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  Recommended palliative systemic chemotherapy with cisplatin and etoposide. She has previously expressed reservations about chemotherapy, preferring to rely on spiritual beliefs for healing. Without chemotherapy, the disease is expected to progress and worsen, potentially affecting other organs. Surgery is not feasible due to multiple liver metastases. She discussed this further with rest of the family members and her pastor before making final decision.  On her consultation with us  on 07/26/2024, we submitted request for staging PET  scan.  On 08/08/2024, PET scan showed multiple hepatic lesions with intense metabolic activity, progressed from July 2025, consistent with metastatic disease.  Hypermetabolic mass in the pancreatic body measuring approximately 4.3 x 2.8 cm, presumed primary malignancy.  No hypermetabolic abdominal lymph nodes.  No lung, skeletal mets.  Patient is now agreeable to proceeding with systemic treatments.  Discussed treatment options after reviewing NCCN guidelines.  Plan made to proceed with cisplatin and etoposide.  Cisplatin will be given on day 1, etoposide on days 1-3 of 21-day cycle.  Plan to continue for 6-8 cycles with restaging imaging after every 3 cycles.  Will arrange for chemo education, Port-A-Cath placement and plan to begin treatments in the next week or so.  First cycle may have to be given using peripheral IV, if there is a long delay in Port-A-Cath placement.

## 2024-08-15 NOTE — Progress Notes (Signed)
 [facility] Clinical Social Work  Initial Assessment   Jo Allen is a 58 y.o. year old female accompanied by patient and son, and family members attending on facetime. Clinical Social Work was referred by nurse navigator for assessment of psychosocial needs.   SDOH (Social Determinants of Health) assessments performed: Yes SDOH Interventions    Flowsheet Row Office Visit from 07/26/2024 in [facility] - A Dept Of [place] ED to Hosp-Admission (Discharged) from 04/19/2024 in [place]  [place] [facility] Telephone from 03/30/2023 in [facility] Community Care Coordination  SDOH Interventions     Food Insecurity Interventions Intervention Not Indicated Intervention Not Indicated, Inpatient TOC [id number] Referral, Other (Comment)  [referral to local food pantries]  Housing Interventions Intervention Not Indicated Intervention Not Indicated, Inpatient TOC --  Transportation Interventions Intervention Not Indicated Intervention Not Indicated, Inpatient TOC --  Utilities Interventions Intervention Not Indicated Intervention Not Indicated, Inpatient TOC --    SDOH Screenings   Food Insecurity: No Food Insecurity (08/15/2024)  Housing: Low Risk  (08/15/2024)  Transportation Needs: No Transportation Needs (08/15/2024)  Utilities: Not At Risk (08/15/2024)  Depression (PHQ2-9): Low Risk  (08/15/2024)  Financial Resource Strain: Low Risk  (12/19/2023)   Received from [facility]  Physical Activity: Unknown (11/02/2022)   Received from [name]  Social Connections: Socially Integrated (11/02/2022)   Received from [facility]  Stress: Stress Concern Present (11/02/2022)   Received from [facility]  Tobacco Use: Low Risk  (07/26/2024)    PHQ 2/9:    08/15/2024   11:01 AM 07/26/2024   11:15 AM  Depression screen PHQ 2/9  Decreased Interest 0 0  Down, Depressed, Hopeless 0 0  PHQ - 2 Score 0 0     Distress Screen  completed: No     No data to display            Family/Social Information:  Housing Arrangement: patient lives with her son in [place]. Family members/support persons in your life? Ms. Nation has a host of family members (evident through the virtual visit) and a faith community as her support system. However, her son is her primary caregiver due to her family living out of state.  Transportation concerns: no  Employment: Working part time as a engineer, structural. Ms. Berman noted the physical requirements of her job. CSW introduced conversation of how treatment may impact employment. Ms. Markham identified her employment as important but understands side effects may impact energy levels. She would like to continue to work while undergoing treatment. Ms. Imelda son stated her health is the priority and will support her financially if needed.  Income source: Employment Financial concerns: No Type of concern: None Food access concerns: no Religious or spiritual practice: Yes-Ms. Malter is of the [employer]). Ms. Imelda faith is a vital part of coping with the diagnosis and making treatment decisions. Ms. Petterson is well connected to her faith community.  Advanced directives: No  Coping/ Adjustment to diagnosis: Patient understands treatment plan and what happens next? yes, patient and family understand treatment plan and goals.  Concerns about diagnosis and/or treatment: Patient's son is worried about how treatment will impact her physically. Ms. Scogin denied any worries or concerns. Patient reported stressors: No reported stressors.  Patient enjoys time with family/ friends Current coping skills/ strengths: Ability for insight , Active sense of humor , Average or above average intelligence , Capable of independent living , Communication skills , Financial  means , General fund of knowledge , Motivation for treatment/growth , Physical Health , Religious  Affiliation , Special hobby/interest , and Supportive family/friends     SUMMARY: Current SDOH Barriers:  No SDOH barriers noted.   Clinical Social Work Clinical Goal(s):  No clinical social work goals at this time  Interventions: Discussed common feeling and emotions when being diagnosed with cancer, and the importance of support during treatment Informed patient of the support team roles and support services at [facility] Provided CSW contact information and encouraged patient to call with any questions or concerns Placed Spiritual Care Referral   Follow Up Plan: Patient will contact CSW with any support or resource needs and CSW will follow-up with patient by phone  Patient verbalizes understanding of plan: Yes   Lizbeth Sprague, LCSW Clinical Social Worker [facility]

## 2024-08-15 NOTE — Assessment & Plan Note (Signed)
 Intermittent pain managed with Aleve  and heat pads. Tramadol  discussed as an option, with past upset stomach but no allergy. - Prescribe tramadol , to be taken up to four times a day

## 2024-08-15 NOTE — Progress Notes (Signed)
 PATIENT NAVIGATOR PROGRESS NOTE  Name: Jo Allen Date: 08/15/2024 MRN: [id number]  DOB: 1966-01-12   Reason for visit:  Follow-up appt with Dr. Lorelie discussion  Comments:   Patient was seen in office during her follow-up appointment with Dr. Autumn.  Patient was accompanied by her son and also had family members on a video conference call during her appt. Patient was given Journey's binder containing information about her diagnosis.  Patient asked if she could speak to a dietician regarding what foods she needs to eat while undergoing chemo. Patient was given basic nutrition instructions, such as increasing protein in her diet.  Informed patient a nutrition referral will also be placed.    Patient was also given chest port-a-cath education.  Orders were entered for port placement.    Patient was instructed to contact office with any questions or concerns. Patient verbalized understanding.   Time spent counseling/coordinating care: > 60 minutes

## 2024-08-16 ENCOUNTER — Other Ambulatory Visit: Payer: Self-pay

## 2024-08-17 ENCOUNTER — Other Ambulatory Visit: Payer: Self-pay

## 2024-08-18 ENCOUNTER — Other Ambulatory Visit: Payer: Self-pay

## 2024-08-20 ENCOUNTER — Encounter (HOSPITAL_COMMUNITY): Payer: Self-pay

## 2024-08-20 ENCOUNTER — Telehealth: Payer: Self-pay

## 2024-08-20 ENCOUNTER — Ambulatory Visit

## 2024-08-20 ENCOUNTER — Inpatient Hospital Stay (HOSPITAL_COMMUNITY)
Admission: EM | Admit: 2024-08-20 | Discharge: 2024-08-26 | DRG: 843 | Disposition: A | Discharge status: Home / Self Care | Attending: Internal Medicine | Admitting: Internal Medicine

## 2024-08-20 ENCOUNTER — Other Ambulatory Visit: Payer: Self-pay

## 2024-08-20 ENCOUNTER — Encounter: Payer: Self-pay | Admitting: Oncology

## 2024-08-20 ENCOUNTER — Telehealth: Payer: Self-pay | Admitting: Oncology

## 2024-08-20 DIAGNOSIS — Z823 Family history of stroke: Secondary | ICD-10-CM

## 2024-08-20 DIAGNOSIS — C7B8 Other secondary neuroendocrine tumors: Secondary | ICD-10-CM | POA: Diagnosis present

## 2024-08-20 DIAGNOSIS — E66811 Obesity, class 1: Secondary | ICD-10-CM | POA: Diagnosis present

## 2024-08-20 DIAGNOSIS — E785 Hyperlipidemia, unspecified: Secondary | ICD-10-CM | POA: Diagnosis present

## 2024-08-20 DIAGNOSIS — Z79899 Other long term (current) drug therapy: Secondary | ICD-10-CM

## 2024-08-20 DIAGNOSIS — C7A1 Malignant poorly differentiated neuroendocrine tumors: Secondary | ICD-10-CM | POA: Diagnosis present

## 2024-08-20 DIAGNOSIS — Z818 Family history of other mental and behavioral disorders: Secondary | ICD-10-CM

## 2024-08-20 DIAGNOSIS — R112 Nausea with vomiting, unspecified: Secondary | ICD-10-CM | POA: Diagnosis not present

## 2024-08-20 DIAGNOSIS — F39 Unspecified mood [affective] disorder: Secondary | ICD-10-CM | POA: Diagnosis present

## 2024-08-20 DIAGNOSIS — K8689 Other specified diseases of pancreas: Secondary | ICD-10-CM | POA: Diagnosis not present

## 2024-08-20 DIAGNOSIS — C259 Malignant neoplasm of pancreas, unspecified: Secondary | ICD-10-CM | POA: Diagnosis present

## 2024-08-20 DIAGNOSIS — G893 Neoplasm related pain (acute) (chronic): Secondary | ICD-10-CM | POA: Diagnosis present

## 2024-08-20 DIAGNOSIS — K5903 Drug induced constipation: Secondary | ICD-10-CM | POA: Diagnosis present

## 2024-08-20 DIAGNOSIS — R10816 Epigastric abdominal tenderness: Secondary | ICD-10-CM | POA: Diagnosis not present

## 2024-08-20 DIAGNOSIS — R7989 Other specified abnormal findings of blood chemistry: Secondary | ICD-10-CM | POA: Diagnosis present

## 2024-08-20 DIAGNOSIS — R7303 Prediabetes: Secondary | ICD-10-CM | POA: Diagnosis present

## 2024-08-20 DIAGNOSIS — R1013 Epigastric pain: Secondary | ICD-10-CM | POA: Diagnosis not present

## 2024-08-20 DIAGNOSIS — Z833 Family history of diabetes mellitus: Secondary | ICD-10-CM | POA: Diagnosis not present

## 2024-08-20 DIAGNOSIS — K859 Acute pancreatitis without necrosis or infection, unspecified: Secondary | ICD-10-CM | POA: Diagnosis present

## 2024-08-20 DIAGNOSIS — Z96653 Presence of artificial knee joint, bilateral: Secondary | ICD-10-CM | POA: Diagnosis present

## 2024-08-20 DIAGNOSIS — I10 Essential (primary) hypertension: Secondary | ICD-10-CM | POA: Diagnosis present

## 2024-08-20 DIAGNOSIS — Z880 Allergy status to penicillin: Secondary | ICD-10-CM | POA: Diagnosis not present

## 2024-08-20 DIAGNOSIS — C7A8 Other malignant neuroendocrine tumors: Secondary | ICD-10-CM | POA: Diagnosis present

## 2024-08-20 DIAGNOSIS — R7401 Elevation of levels of liver transaminase levels: Secondary | ICD-10-CM | POA: Diagnosis not present

## 2024-08-20 DIAGNOSIS — I2510 Atherosclerotic heart disease of native coronary artery without angina pectoris: Secondary | ICD-10-CM | POA: Diagnosis present

## 2024-08-20 DIAGNOSIS — Z1152 Encounter for screening for COVID-19: Secondary | ICD-10-CM | POA: Diagnosis not present

## 2024-08-20 DIAGNOSIS — I25118 Atherosclerotic heart disease of native coronary artery with other forms of angina pectoris: Secondary | ICD-10-CM

## 2024-08-20 DIAGNOSIS — K219 Gastro-esophageal reflux disease without esophagitis: Secondary | ICD-10-CM | POA: Diagnosis present

## 2024-08-20 DIAGNOSIS — K858 Other acute pancreatitis without necrosis or infection: Secondary | ICD-10-CM | POA: Diagnosis not present

## 2024-08-20 DIAGNOSIS — Z8249 Family history of ischemic heart disease and other diseases of the circulatory system: Secondary | ICD-10-CM

## 2024-08-20 DIAGNOSIS — Z885 Allergy status to narcotic agent status: Secondary | ICD-10-CM | POA: Diagnosis not present

## 2024-08-20 DIAGNOSIS — J45909 Unspecified asthma, uncomplicated: Secondary | ICD-10-CM | POA: Diagnosis present

## 2024-08-20 DIAGNOSIS — Z955 Presence of coronary angioplasty implant and graft: Secondary | ICD-10-CM

## 2024-08-20 DIAGNOSIS — C787 Secondary malignant neoplasm of liver and intrahepatic bile duct: Secondary | ICD-10-CM | POA: Diagnosis not present

## 2024-08-20 DIAGNOSIS — T402X5A Adverse effect of other opioids, initial encounter: Secondary | ICD-10-CM | POA: Diagnosis present

## 2024-08-20 DIAGNOSIS — Z6835 Body mass index (BMI) 35.0-35.9, adult: Secondary | ICD-10-CM | POA: Diagnosis not present

## 2024-08-20 DIAGNOSIS — Z791 Long term (current) use of non-steroidal anti-inflammatories (NSAID): Secondary | ICD-10-CM

## 2024-08-20 DIAGNOSIS — R1011 Right upper quadrant pain: Secondary | ICD-10-CM | POA: Diagnosis not present

## 2024-08-20 LAB — COMPREHENSIVE METABOLIC PANEL WITH GFR
ALT: 60 U/L — ABNORMAL HIGH (ref 0–44)
AST: 50 U/L — ABNORMAL HIGH (ref 15–41)
Albumin: 4.3 g/dL (ref 3.5–5.0)
Alkaline Phosphatase: 434 U/L — ABNORMAL HIGH (ref 38–126)
Anion gap: 11 (ref 5–15)
BUN: 6 mg/dL (ref 6–20)
CO2: 29 mmol/L (ref 22–32)
Calcium: 9.9 mg/dL (ref 8.9–10.3)
Chloride: 100 mmol/L (ref 98–111)
Creatinine, Ser: 0.5 mg/dL (ref 0.44–1.00)
GFR, Estimated: >60 mL/min (ref >60)
Glucose, Bld: 160 mg/dL — ABNORMAL HIGH (ref 70–99)
Potassium: 3.7 mmol/L (ref 3.5–5.1)
Sodium: 140 mmol/L (ref 135–145)
Total Bilirubin: 0.3 mg/dL (ref 0.0–1.2)
Total Protein: 8 g/dL (ref 6.5–8.1)

## 2024-08-20 LAB — CBC
HCT: 43.1 % (ref 36.0–46.0)
Hemoglobin: 13.1 g/dL (ref 12.0–15.0)
MCH: 25.7 pg — ABNORMAL LOW (ref 26.0–34.0)
MCHC: 30.4 g/dL (ref 30.0–36.0)
MCV: 84.7 fL (ref 80.0–100.0)
Platelets: 266 K/uL (ref 150–400)
RBC: 5.09 MIL/uL (ref 3.87–5.11)
RDW: 12.8 % (ref 11.5–15.5)
WBC: 7.2 K/uL (ref 4.0–10.5)
nRBC: 0 % (ref 0.0–0.2)

## 2024-08-20 LAB — TROPONIN T, HIGH SENSITIVITY: Troponin T High Sensitivity: <15 ng/L (ref 0–19)

## 2024-08-20 LAB — LIPASE, BLOOD: Lipase: 607 U/L — ABNORMAL HIGH (ref 11–51)

## 2024-08-20 MED ORDER — SODIUM CHLORIDE 0.9 % IV BOLUS
1000.0000 mL | Freq: Once | INTRAVENOUS | Status: AC
  Administered 2024-08-20: 1000 mL via INTRAVENOUS

## 2024-08-20 MED ORDER — HYDROMORPHONE HCL 1 MG/ML IJ SOLN
0.5000 mg | Freq: Once | INTRAMUSCULAR | Status: AC
  Administered 2024-08-20: 0.5 mg via INTRAVENOUS
  Filled 2024-08-20: qty 1

## 2024-08-20 MED ORDER — ONDANSETRON HCL 4 MG/2ML IJ SOLN
4.0000 mg | Freq: Once | INTRAMUSCULAR | Status: AC
  Administered 2024-08-20: 4 mg via INTRAVENOUS
  Filled 2024-08-20: qty 2

## 2024-08-20 NOTE — Telephone Encounter (Signed)
 Returned patient's phone call regarding concerns over stair climbing challenges in her home and how chemotherapy will effect her ability to get in and out of her home. Patient requested to have her initial chemo start up, which is 3 days in a row, to be done as in-patient. Dr. Autumn aware. Patient agreed to follow up with her insurance company.

## 2024-08-20 NOTE — ED Provider Notes (Signed)
 [place] EMERGENCY DEPARTMENT AT [facility] Provider Note   CSN: [id number] Arrival date & time: 08/20/24  8147     Patient presents with: Abdominal Pain and Emesis   Jo Allen is a 58 y.o. female.  {Add pertinent medical, surgical, social history, OB history to HPI:32947} 58 yo female presents with complaint of nausea, vomiting, epigastric abdominal pain and a runny nose. Symptoms started yesterday, worse today. Recently diagnosed with pancreatic cancer with mets to the liver with plan to start chemo later this week. Denies fevers but has had chills and sweats. Denies CP, SHOB, changes in bowel or bladder habits.  No prior abdominal surgeries.  History of pancreatitis earlier this year, states feels similar.        Prior to Admission medications   Medication Sig Start Date End Date Taking? Authorizing Provider  albuterol  (VENTOLIN  HFA) 108 (90 Base) MCG/ACT inhaler Inhale 1-2 puffs into the lungs every 6 (six) hours as needed for wheezing or shortness of breath. 01/03/22   Billy Asberry FALCON, PA-C  amLODipine  (NORVASC ) 10 MG tablet Take 1 tablet (10 mg total) by mouth daily. 04/29/23   Ladona Heinz, MD  Biotin  1000 MCG CHEW Chew 2,000 mcg by mouth daily.    [provider]  buPROPion  (WELLBUTRIN  XL) 150 MG 24 hr tablet Take 150 mg by mouth daily. 01/24/24   [provider]  Cholecalciferol (VITAMIN D3) 50 MCG (2000 UT) capsule Take 4,000 Units by mouth daily.    [provider]  CRANBERRY PO Take 1 tablet by mouth daily.    [provider]  cyclobenzaprine  (FLEXERIL ) 10 MG tablet Take 1 tablet (10 mg total) by mouth 2 (two) times daily as needed for muscle spasms. Patient taking differently: Take 10 mg by mouth daily. 02/03/24   Kingsley, Victoria K, DO  dexamethasone  (DECADRON ) 4 MG tablet Take for 1 day starting the day after chemotherapy on day 4. Take with food. 08/15/24   Pasam, Chinita, MD  ferrous sulfate  325 (65 FE) MG EC  tablet Take 325 mg by mouth daily.    [provider]  gabapentin  (NEURONTIN ) 100 MG capsule Take 100 mg by mouth every 8 (eight) hours. 12/13/23   [provider]  lamoTRIgine  (LAMICTAL ) 200 MG tablet Take 200 mg by mouth 2 (two) times daily.    [provider]  levocetirizine (XYZAL) 5 MG tablet Take 5 mg by mouth daily.    [provider]  lidocaine -prilocaine (EMLA) cream Apply to affected area once 08/15/24   Pasam, Avinash, MD  lipase/protease/amylase (CREON ) 36000 UNITS CPEP capsule Take 1 capsule (36,000 Units total) by mouth 3 (three) times daily with meals. 04/27/24   Danford, Lonni SQUIBB, MD  Magnesium Gluconate (MAGNESIUM 27 PO) Take 1 tablet by mouth daily.    [provider]  meloxicam (MOBIC) 15 MG tablet Take 15 mg by mouth daily.    [provider]  metoprolol  succinate (TOPROL  XL) 50 MG 24 hr tablet Take 1 tablet (50 mg total) by mouth daily. 04/29/23 07/26/24  Ladona Heinz, MD  nitroGLYCERIN  (NITROSTAT ) 0.4 MG SL tablet Place 1 tablet (0.4 mg total) under the tongue every 5 (five) minutes as needed for chest pain. If you require more than two tablets five minutes apart go to the nearest ER via EMS. Patient not taking: Reported on 07/26/2024 04/14/23 05/14/23  Tolia, Sunit, DO  ondansetron  (ZOFRAN ) 4 MG tablet Take 1 tablet (4 mg total) by mouth every 6 (six) hours as needed  for nausea. 04/27/24   Danford, Lonni SQUIBB, MD  ondansetron  (ZOFRAN ) 8 MG tablet Take 1 tablet (8 mg total) by mouth every 8 (eight) hours as needed for nausea or vomiting. Start on the third day after cisplatin. 08/15/24   Pasam, Avinash, MD  prochlorperazine (COMPAZINE) 10 MG tablet Take 1 tablet (10 mg total) by mouth every 6 (six) hours as needed for nausea or vomiting. 08/15/24   Pasam, Chinita, MD  QUEtiapine  (SEROQUEL ) 100 MG tablet Take 100 mg by mouth at bedtime. 04/04/24   [provider]  tiZANidine  (ZANAFLEX ) 2 MG tablet Take 1-2 tablets (2-4 mg  total) by mouth every 6 (six) hours as needed for muscle spasms. 02/01/21   Wieters, Hallie C, PA-C  traMADol  (ULTRAM ) 50 MG tablet Take 1 tablet (50 mg total) by mouth every 6 (six) hours as needed. 08/15/24   Pasam, Avinash, MD  vitamin B-12 (CYANOCOBALAMIN ) 1000 MCG tablet Take 1,000 mcg by mouth daily.    [provider]  zaleplon (SONATA) 5 MG capsule Take 5 mg by mouth at bedtime. 04/04/24   [provider]    Allergies: Morphine  and codeine, Trazodone, and Penicillins    Review of Systems Negative except as per HPI Updated Vital Signs BP (!) 169/102 (BP Location: Left Arm)   Pulse 94   Temp 98.1 F (36.7 C) (Oral)   Resp 16   SpO2 100%   Physical Exam Vitals and nursing note reviewed.  Constitutional:      General: She is not in acute distress.    Appearance: She is well-developed. She is not diaphoretic.  HENT:     Head: Normocephalic and atraumatic.     Nose: Nose normal.  Cardiovascular:     Rate and Rhythm: Normal rate and regular rhythm.     Pulses: Normal pulses.     Heart sounds: Normal heart sounds.  Pulmonary:     Effort: Pulmonary effort is normal.     Breath sounds: Normal breath sounds.  Abdominal:     Palpations: Abdomen is soft.     Tenderness: There is generalized abdominal tenderness and tenderness in the right upper quadrant and epigastric area.  Musculoskeletal:     Cervical back: Neck supple.     Right lower leg: No edema.     Left lower leg: No edema.  Skin:    General: Skin is warm and dry.     Findings: No erythema or rash.  Neurological:     Mental Status: She is alert and oriented to person, place, and time.  Psychiatric:        Behavior: Behavior normal.     (all labs ordered are listed, but only abnormal results are displayed) Labs Reviewed  LIPASE, BLOOD - Abnormal; Notable for the following components:      Result Value   Lipase 607 (*)    All other components within normal limits  COMPREHENSIVE METABOLIC PANEL  WITH GFR - Abnormal; Notable for the following components:   Glucose, Bld 160 (*)    AST 50 (*)    ALT 60 (*)    Alkaline Phosphatase 434 (*)    All other components within normal limits  CBC - Abnormal; Notable for the following components:   MCH 25.7 (*)    All other components within normal limits  RESP PANEL BY RT-PCR (RSV, FLU A&B, COVID)  RVPGX2  URINALYSIS, ROUTINE W REFLEX MICROSCOPIC  TROPONIN T, HIGH SENSITIVITY    EKG: None  Radiology: No results  found.  {Document cardiac monitor, telemetry assessment procedure when appropriate:32947} Procedures   Medications Ordered in the ED  sodium chloride  0.9 % bolus 1,000 mL (has no administration in time range)  ondansetron  (ZOFRAN ) injection 4 mg (has no administration in time range)  HYDROmorphone  (DILAUDID ) injection 0.5 mg (has no administration in time range)      {Click here for ABCD2, HEART and other calculators REFRESH Note before signing:1}                              Medical Decision Making Amount and/or Complexity of Data Reviewed Radiology: ordered.  Risk Prescription drug management.   This patient presents to the ED for concern of ***, this involves an extensive number of treatment options, and is a complaint that carries with it a high risk of complications and morbidity.  The differential diagnosis includes ***   Co morbidities / Chronic conditions that complicate the patient evaluation  ***   Additional history obtained:  Additional history obtained from EMR External records from outside source obtained and reviewed including ***   Lab Tests:  I Ordered, and personally interpreted labs.  The pertinent results include:  ***   Imaging Studies ordered:  I ordered imaging studies including ***  I independently visualized and interpreted imaging which showed *** I agree with the radiologist interpretation   Cardiac Monitoring: / EKG:  The patient was maintained on a cardiac monitor.  I  personally viewed and interpreted the cardiac monitored which showed an underlying rhythm of: ***   Problem List / ED Course / Critical interventions / Medication management  *** I ordered medication including ***   Reevaluation of the patient after these medicines showed that the patient *** I have reviewed the patients home medicines and have made adjustments as needed   Consultations Obtained:  I requested consultation with the ***,  and discussed lab and imaging findings as well as pertinent plan - they recommend: ***   Social Determinants of Health:  ***   Test / Admission - Considered:  ***   {Document critical care time when appropriate  Document review of labs and clinical decision tools ie CHADS2VASC2, etc  Document your independent review of radiology images and any outside records  Document your discussion with family members, caretakers and with consultants  Document social determinants of health affecting pt's care  Document your decision making why or why not admission, treatments were needed:32947:::1}   Final diagnoses:  None    ED Discharge Orders     None

## 2024-08-20 NOTE — ED Triage Notes (Signed)
 Pt arrives via POV. PT reports epigastric pain, nausea, and vomiting that started today. States it did occur after eating. PT reports being recently diagnosed with pancreatic cancer. She has not started any treatment yet. Pt is AxOx4.

## 2024-08-21 ENCOUNTER — Inpatient Hospital Stay

## 2024-08-21 ENCOUNTER — Inpatient Hospital Stay: Attending: Oncology

## 2024-08-21 ENCOUNTER — Encounter (HOSPITAL_COMMUNITY): Payer: Self-pay

## 2024-08-21 ENCOUNTER — Inpatient Hospital Stay: Admitting: Nurse Practitioner

## 2024-08-21 ENCOUNTER — Emergency Department (HOSPITAL_COMMUNITY)

## 2024-08-21 DIAGNOSIS — J45909 Unspecified asthma, uncomplicated: Secondary | ICD-10-CM | POA: Diagnosis present

## 2024-08-21 DIAGNOSIS — E639 Nutritional deficiency, unspecified: Secondary | ICD-10-CM | POA: Insufficient documentation

## 2024-08-21 DIAGNOSIS — R7303 Prediabetes: Secondary | ICD-10-CM | POA: Diagnosis present

## 2024-08-21 DIAGNOSIS — T402X5A Adverse effect of other opioids, initial encounter: Secondary | ICD-10-CM | POA: Diagnosis present

## 2024-08-21 DIAGNOSIS — R1013 Epigastric pain: Principal | ICD-10-CM

## 2024-08-21 DIAGNOSIS — C787 Secondary malignant neoplasm of liver and intrahepatic bile duct: Secondary | ICD-10-CM | POA: Insufficient documentation

## 2024-08-21 DIAGNOSIS — Z885 Allergy status to narcotic agent status: Secondary | ICD-10-CM | POA: Diagnosis not present

## 2024-08-21 DIAGNOSIS — C259 Malignant neoplasm of pancreas, unspecified: Secondary | ICD-10-CM

## 2024-08-21 DIAGNOSIS — R7401 Elevation of levels of liver transaminase levels: Secondary | ICD-10-CM | POA: Diagnosis not present

## 2024-08-21 DIAGNOSIS — Z96653 Presence of artificial knee joint, bilateral: Secondary | ICD-10-CM | POA: Diagnosis present

## 2024-08-21 DIAGNOSIS — E66811 Obesity, class 1: Secondary | ICD-10-CM | POA: Diagnosis present

## 2024-08-21 DIAGNOSIS — I2510 Atherosclerotic heart disease of native coronary artery without angina pectoris: Secondary | ICD-10-CM | POA: Diagnosis present

## 2024-08-21 DIAGNOSIS — I10 Essential (primary) hypertension: Secondary | ICD-10-CM | POA: Diagnosis not present

## 2024-08-21 DIAGNOSIS — R1011 Right upper quadrant pain: Secondary | ICD-10-CM | POA: Diagnosis not present

## 2024-08-21 DIAGNOSIS — K219 Gastro-esophageal reflux disease without esophagitis: Secondary | ICD-10-CM | POA: Diagnosis present

## 2024-08-21 DIAGNOSIS — T451X5A Adverse effect of antineoplastic and immunosuppressive drugs, initial encounter: Secondary | ICD-10-CM | POA: Insufficient documentation

## 2024-08-21 DIAGNOSIS — K859 Acute pancreatitis without necrosis or infection, unspecified: Secondary | ICD-10-CM | POA: Diagnosis present

## 2024-08-21 DIAGNOSIS — K8689 Other specified diseases of pancreas: Secondary | ICD-10-CM | POA: Diagnosis not present

## 2024-08-21 DIAGNOSIS — E785 Hyperlipidemia, unspecified: Secondary | ICD-10-CM | POA: Diagnosis not present

## 2024-08-21 DIAGNOSIS — C7A8 Other malignant neuroendocrine tumors: Secondary | ICD-10-CM | POA: Diagnosis present

## 2024-08-21 DIAGNOSIS — C252 Malignant neoplasm of tail of pancreas: Secondary | ICD-10-CM | POA: Insufficient documentation

## 2024-08-21 DIAGNOSIS — D701 Agranulocytosis secondary to cancer chemotherapy: Secondary | ICD-10-CM | POA: Insufficient documentation

## 2024-08-21 DIAGNOSIS — F39 Unspecified mood [affective] disorder: Secondary | ICD-10-CM | POA: Insufficient documentation

## 2024-08-21 DIAGNOSIS — C7B8 Other secondary neuroendocrine tumors: Secondary | ICD-10-CM | POA: Diagnosis present

## 2024-08-21 DIAGNOSIS — Z880 Allergy status to penicillin: Secondary | ICD-10-CM | POA: Diagnosis not present

## 2024-08-21 DIAGNOSIS — K5903 Drug induced constipation: Secondary | ICD-10-CM | POA: Diagnosis present

## 2024-08-21 DIAGNOSIS — G893 Neoplasm related pain (acute) (chronic): Secondary | ICD-10-CM | POA: Diagnosis present

## 2024-08-21 DIAGNOSIS — Z1152 Encounter for screening for COVID-19: Secondary | ICD-10-CM | POA: Diagnosis not present

## 2024-08-21 DIAGNOSIS — R10816 Epigastric abdominal tenderness: Secondary | ICD-10-CM | POA: Diagnosis not present

## 2024-08-21 DIAGNOSIS — Z6835 Body mass index (BMI) 35.0-35.9, adult: Secondary | ICD-10-CM | POA: Diagnosis not present

## 2024-08-21 DIAGNOSIS — R7989 Other specified abnormal findings of blood chemistry: Secondary | ICD-10-CM | POA: Diagnosis present

## 2024-08-21 DIAGNOSIS — Z8249 Family history of ischemic heart disease and other diseases of the circulatory system: Secondary | ICD-10-CM | POA: Diagnosis not present

## 2024-08-21 DIAGNOSIS — K858 Other acute pancreatitis without necrosis or infection: Secondary | ICD-10-CM | POA: Diagnosis not present

## 2024-08-21 DIAGNOSIS — D630 Anemia in neoplastic disease: Secondary | ICD-10-CM | POA: Insufficient documentation

## 2024-08-21 DIAGNOSIS — C7A1 Malignant poorly differentiated neuroendocrine tumors: Secondary | ICD-10-CM | POA: Diagnosis present

## 2024-08-21 DIAGNOSIS — Z79899 Other long term (current) drug therapy: Secondary | ICD-10-CM | POA: Diagnosis not present

## 2024-08-21 LAB — URINALYSIS, ROUTINE W REFLEX MICROSCOPIC
Bacteria, UA: NONE SEEN
Bilirubin Urine: NEGATIVE
Glucose, UA: NEGATIVE mg/dL
Hgb urine dipstick: NEGATIVE
Ketones, ur: NEGATIVE mg/dL
Leukocytes,Ua: NEGATIVE
Nitrite: NEGATIVE
Protein, ur: NEGATIVE mg/dL
Specific Gravity, Urine: 1.015 (ref 1.005–1.030)
pH: 5 (ref 5.0–8.0)

## 2024-08-21 LAB — RESP PANEL BY RT-PCR (RSV, FLU A&B, COVID)  RVPGX2
Influenza A by PCR: NEGATIVE
Influenza B by PCR: NEGATIVE
Resp Syncytial Virus by PCR: NEGATIVE
SARS Coronavirus 2 by RT PCR: NEGATIVE

## 2024-08-21 MED ORDER — SODIUM CHLORIDE 0.9 % IV SOLN: INTRAVENOUS | Status: AC

## 2024-08-21 MED ORDER — ROSUVASTATIN CALCIUM 20 MG PO TABS
20.0000 mg | ORAL_TABLET | Freq: Every day | ORAL | Status: DC
  Administered 2024-08-21: 20 mg via ORAL
  Filled 2024-08-21: qty 1

## 2024-08-21 MED ORDER — BUPROPION HCL ER (XL) 150 MG PO TB24
150.0000 mg | ORAL_TABLET | Freq: Every day | ORAL | Status: DC
  Administered 2024-08-21 – 2024-08-26 (×6): 150 mg via ORAL
  Filled 2024-08-21 (×6): qty 1

## 2024-08-21 MED ORDER — METOPROLOL SUCCINATE ER 50 MG PO TB24
50.0000 mg | ORAL_TABLET | Freq: Every day | ORAL | Status: DC
  Administered 2024-08-22 – 2024-08-26 (×5): 50 mg via ORAL
  Filled 2024-08-21 (×5): qty 1

## 2024-08-21 MED ORDER — CYCLOBENZAPRINE HCL 10 MG PO TABS
10.0000 mg | ORAL_TABLET | Freq: Every day | ORAL | Status: DC | PRN
Start: 2024-08-21 — End: 2024-08-26
  Administered 2024-08-23: 10 mg via ORAL
  Filled 2024-08-21: qty 1

## 2024-08-21 MED ORDER — HYDROMORPHONE HCL 1 MG/ML IJ SOLN
0.5000 mg | Freq: Once | INTRAMUSCULAR | Status: AC
  Administered 2024-08-21: 0.5 mg via INTRAVENOUS
  Filled 2024-08-21: qty 1

## 2024-08-21 MED ORDER — LAMOTRIGINE 100 MG PO TABS
200.0000 mg | ORAL_TABLET | Freq: Two times a day (BID) | ORAL | Status: DC
Start: 2024-08-21 — End: 2024-08-26
  Administered 2024-08-21 – 2024-08-26 (×11): 200 mg via ORAL
  Filled 2024-08-21 (×10): qty 2

## 2024-08-21 MED ORDER — HYDROMORPHONE HCL 1 MG/ML IJ SOLN
0.5000 mg | INTRAMUSCULAR | Status: DC | PRN
  Administered 2024-08-21: 0.5 mg via INTRAVENOUS
  Filled 2024-08-21: qty 1

## 2024-08-21 MED ORDER — HYDROMORPHONE HCL 1 MG/ML IJ SOLN
1.0000 mg | INTRAMUSCULAR | Status: DC | PRN
  Administered 2024-08-21 – 2024-08-25 (×10): 1 mg via INTRAVENOUS
  Filled 2024-08-21 (×10): qty 1

## 2024-08-21 MED ORDER — PANCRELIPASE (LIP-PROT-AMYL) 36000-114000 UNITS PO CPEP
36000.0000 [IU] | ORAL_CAPSULE | Freq: Three times a day (TID) | ORAL | Status: DC
  Administered 2024-08-21 – 2024-08-26 (×15): 36000 [IU] via ORAL
  Filled 2024-08-21 (×16): qty 1

## 2024-08-21 MED ORDER — PANTOPRAZOLE SODIUM 40 MG IV SOLR
40.0000 mg | INTRAVENOUS | Status: DC
  Administered 2024-08-21 – 2024-08-24 (×4): 40 mg via INTRAVENOUS
  Filled 2024-08-21 (×5): qty 10

## 2024-08-21 MED ORDER — ALBUTEROL SULFATE (2.5 MG/3ML) 0.083% IN NEBU: 3.0000 mL | INHALATION_SOLUTION | Freq: Four times a day (QID) | RESPIRATORY_TRACT | Status: DC | PRN

## 2024-08-21 MED ORDER — ONDANSETRON HCL 4 MG/2ML IJ SOLN
4.0000 mg | Freq: Four times a day (QID) | INTRAMUSCULAR | Status: DC | PRN
Start: 2024-08-21 — End: 2024-08-23
  Administered 2024-08-21 – 2024-08-23 (×5): 4 mg via INTRAVENOUS
  Filled 2024-08-21 (×5): qty 2

## 2024-08-21 MED ORDER — OXYCODONE HCL 5 MG PO TABS
5.0000 mg | ORAL_TABLET | ORAL | Status: DC | PRN
  Administered 2024-08-21 – 2024-08-26 (×11): 5 mg via ORAL
  Filled 2024-08-21 (×11): qty 1

## 2024-08-21 MED ORDER — ACETAMINOPHEN 325 MG PO TABS: 650.0000 mg | ORAL_TABLET | Freq: Four times a day (QID) | ORAL | Status: DC | PRN

## 2024-08-21 MED ORDER — PROCHLORPERAZINE EDISYLATE 10 MG/2ML IJ SOLN
5.0000 mg | Freq: Four times a day (QID) | INTRAMUSCULAR | Status: DC | PRN
  Administered 2024-08-21 – 2024-08-25 (×5): 5 mg via INTRAVENOUS
  Filled 2024-08-21 (×5): qty 2

## 2024-08-21 MED ORDER — AMLODIPINE BESYLATE 5 MG PO TABS
10.0000 mg | ORAL_TABLET | Freq: Every day | ORAL | Status: DC
  Administered 2024-08-21 – 2024-08-26 (×5): 10 mg via ORAL
  Filled 2024-08-21 (×6): qty 2

## 2024-08-21 MED ORDER — QUETIAPINE FUMARATE 100 MG PO TABS
100.0000 mg | ORAL_TABLET | Freq: Every day | ORAL | Status: DC
  Administered 2024-08-21 – 2024-08-25 (×5): 100 mg via ORAL
  Filled 2024-08-21 (×5): qty 1

## 2024-08-21 MED ORDER — ENOXAPARIN SODIUM 40 MG/0.4ML IJ SOSY
40.0000 mg | PREFILLED_SYRINGE | INTRAMUSCULAR | Status: DC
  Administered 2024-08-21 – 2024-08-22 (×2): 40 mg via SUBCUTANEOUS
  Filled 2024-08-21 (×3): qty 0.4

## 2024-08-21 MED ORDER — IOHEXOL 300 MG/ML  SOLN
100.0000 mL | Freq: Once | INTRAMUSCULAR | Status: AC | PRN
  Administered 2024-08-21: 100 mL via INTRAVENOUS

## 2024-08-21 NOTE — Assessment & Plan Note (Signed)
 Progressive central abdominal pain w/ worsening nausea and decreased po intake over the past 2-3 days  Noted baseline Primary malignant neuroendocrine tumor of pancreas followed by outpatient hem- onc  Lipase 607 CT imaging with Pancreatic body mass 2.9 cm corresponding to known pancreatic cancer No noted biliary obstruction  Will plan on pain control IVF  Antiemetics  Advance diet as tolerated  Otherwise monitor closely

## 2024-08-21 NOTE — Assessment & Plan Note (Signed)
 Continue statin

## 2024-08-21 NOTE — Progress Notes (Signed)
   08/21/24 1231  TOC Brief Assessment  Insurance and Status Reviewed  Patient has primary care physician Yes [name], [name] [name], [name]  Home environment has been reviewed Home  Prior level of function: Independent  Prior/Current Home Services No current home services  Social Drivers of Health Review SDOH reviewed no interventions necessary  Readmission risk has been reviewed Yes  Transition of care needs no transition of care needs at this time

## 2024-08-21 NOTE — H&P (Addendum)
 History and Physical    Patient: Jo Allen FMW:[id number] DOB: [birth year] DOA: 08/20/2024 DOS: the patient was seen and examined on 08/21/2024 PCP: [name], PA  Patient coming from: Home  Chief Complaint:  Chief Complaint  Patient presents with   Abdominal Pain   Emesis   HPI: Jo Allen is a 58 y.o. female with medical history significant of pancreatic cancer with metastasis, CAD, GERD, asthma, hypertension, hyperlipidemia, mood disorder presenting with pancreatitis.  Patient reports approximately 2 to 3 days of generalized abdominal pain nausea as well as decreased p.o. intake.  Noted to have been admitted for similar issues July of this year with noted finding of pancreatic mass.  Was formally diagnosed with pancreatic cancer within the past 1 to 2 months.  Followed by Dr. Autumn outpatient on active chemotherapy for treatment.  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  There was a smell of eggs and bacon that triggered this event.  Patient denies any active tobacco or alcohol use.  Abdominal pain predominate generalized.  Has upper as well as lower abdominal distribution.  No reported diarrhea.  No chest pain or shortness of breath.  No focal hemiparesis or confusion.  Abdominal pain is progressively worsened over the past 12 to 24 hours. Presented to the ER afebrile, hemodynamically stable.  Satting well on room air.  White count 7.2, hemoglobin 13.1, platelets 266, COVID flu and RSV negative.  Lipase 607.  Creatinine 0.5.  AST 50, ALT 60.  Alk phos 434.  CT imaging with numerous hepatic mets largest 4.1 cm concerning for metastatic disease as well as pancreatic body mass 2.9 cm corresponding to known pancreatic cancer. Review of Systems: As mentioned in the history of present illness. All other systems reviewed and are negative. Past Medical History:  Diagnosis Date   Acute pancreatitis 04/19/2024   Asthma    CAD (coronary artery disease)    GERD  (gastroesophageal reflux disease)    Headache    Heart disease    Hypertension    Obesity    Paresthesia 08/20/2015   Prediabetes    Transfusion history    57yrs ago   Past Surgical History:  Procedure Laterality Date   ANGIOPLASTY     BREAST BIOPSY Left    BREAST BIOPSY WITH SENTINEL LYMPH NODE BIOPSY AND NEEDLE LOCALIZATION Left    COLONOSCOPY WITH PROPOFOL  N/A 02/19/2014   Procedure: COLONOSCOPY WITH PROPOFOL ;  Surgeon: Gladis MARLA Louder, MD;  Location: [facility];  Service: Endoscopy;  Laterality: N/A;   ESOPHAGOGASTRODUODENOSCOPY N/A 07/04/2024   Procedure: EGD (ESOPHAGOGASTRODUODENOSCOPY);  Surgeon: Burnette Fallow, MD;  Location: [facility];  Service: Gastroenterology;  Laterality: N/A;   EUS N/A 07/04/2024   Procedure: ULTRASOUND, UPPER GI TRACT, ENDOSCOPIC;  Surgeon: Burnette Fallow, MD;  Location: [facility];  Service: Gastroenterology;  Laterality: N/A;   FINE NEEDLE ASPIRATION  07/04/2024   Procedure: FINE NEEDLE ASPIRATION;  Surgeon: Burnette Fallow, MD;  Location: [facility];  Service: Gastroenterology;;   JOINT REPLACEMENT Bilateral    LEFT HEART CATH AND CORONARY ANGIOGRAPHY N/A 04/29/2023   Procedure: LEFT HEART CATH AND CORONARY ANGIOGRAPHY;  Surgeon: Ladona Heinz, MD;  Location: [facility];  Service: Cardiovascular;  Laterality: N/A;   TOTAL KNEE ARTHROPLASTY Bilateral    Social History:  reports that she has never smoked. She has never used smokeless tobacco. She reports that she does not drink alcohol and does not use drugs.  Allergies  Allergen Reactions   Morphine  And  Codeine Nausea And Vomiting, Hives and Itching   Trazodone Nausea Only    dizziness, headache   Penicillins Hives    Family History  Problem Relation Age of Onset   Hypertension Mother    Diabetes Mother    Stroke Father    Headache Sister    Depression Brother    Breast cancer Neg Hx     Prior to Admission medications   Medication Sig Start Date End Date Taking? Authorizing  Provider  amLODipine  (NORVASC ) 10 MG tablet Take 1 tablet (10 mg total) by mouth daily. 04/29/23  Yes Ladona Heinz, MD  lamoTRIgine  (LAMICTAL ) 200 MG tablet Take 200 mg by mouth 2 (two) times daily.   Yes [provider]  lidocaine -prilocaine (EMLA) cream Apply to affected area once 08/15/24  Yes Pasam, Avinash, MD  lipase/protease/amylase (CREON ) 36000 UNITS CPEP capsule Take 1 capsule (36,000 Units total) by mouth 3 (three) times daily with meals. 04/27/24  Yes Danford, Lonni SQUIBB, MD  meloxicam (MOBIC) 15 MG tablet Take 15 mg by mouth daily.   Yes [provider]  metoprolol  succinate (TOPROL  XL) 50 MG 24 hr tablet Take 1 tablet (50 mg total) by mouth daily. 04/29/23 08/21/24 Yes Ladona Heinz, MD  ondansetron  (ZOFRAN ) 8 MG tablet Take 1 tablet (8 mg total) by mouth every 8 (eight) hours as needed for nausea or vomiting. Start on the third day after cisplatin. 08/15/24  Yes Pasam, Avinash, MD  QUEtiapine  (SEROQUEL ) 100 MG tablet Take 100 mg by mouth at bedtime. 04/04/24  Yes [provider]  rosuvastatin  (CRESTOR ) 20 MG tablet Take 20 mg by mouth daily. 08/02/24  Yes [provider]  tiZANidine  (ZANAFLEX ) 2 MG tablet Take 1-2 tablets (2-4 mg total) by mouth every 6 (six) hours as needed for muscle spasms. 02/01/21  Yes Wieters, Hallie C, PA-C  vitamin B-12 (CYANOCOBALAMIN ) 1000 MCG tablet Take 1,000 mcg by mouth daily.   Yes [provider]  zaleplon (SONATA) 5 MG capsule Take 5 mg by mouth at bedtime. 04/04/24  Yes [provider]  albuterol  (VENTOLIN  HFA) 108 (90 Base) MCG/ACT inhaler Inhale 1-2 puffs into the lungs every 6 (six) hours as needed for wheezing or shortness of breath. 01/03/22   Billy Asberry FALCON, PA-C  Biotin  1000 MCG CHEW Chew 2,000 mcg by mouth daily.    [provider]  buPROPion  (WELLBUTRIN  XL) 150 MG 24 hr tablet Take 150 mg by mouth daily. 01/24/24   [provider]  Cholecalciferol (VITAMIN D3) 50 MCG (2000 UT)  capsule Take 4,000 Units by mouth daily.    [provider]  CRANBERRY PO Take 1 tablet by mouth daily.    [provider]  cyclobenzaprine  (FLEXERIL ) 10 MG tablet Take 1 tablet (10 mg total) by mouth 2 (two) times daily as needed for muscle spasms. Patient taking differently: Take 10 mg by mouth daily. 02/03/24   Kingsley, Victoria K, DO  dexamethasone  (DECADRON ) 4 MG tablet Take for 1 day starting the day after chemotherapy on day 4. Take with food. 08/15/24   Pasam, Chinita, MD  ferrous sulfate  325 (65 FE) MG EC tablet Take 325 mg by mouth daily.    [provider]  gabapentin  (NEURONTIN ) 100 MG capsule Take 100 mg by mouth every 8 (eight) hours. 12/13/23   [provider]  levocetirizine (XYZAL) 5 MG tablet Take 5 mg by mouth daily.    [provider]  Magnesium Gluconate (MAGNESIUM 27 PO) Take 1 tablet by mouth daily.  [provider]  nitroGLYCERIN  (NITROSTAT ) 0.4 MG SL tablet Place 1 tablet (0.4 mg total) under the tongue every 5 (five) minutes as needed for chest pain. If you require more than two tablets five minutes apart go to the nearest ER via EMS. Patient not taking: Reported on 07/26/2024 04/14/23 05/14/23  Tolia, Sunit, DO  ondansetron  (ZOFRAN ) 4 MG tablet Take 1 tablet (4 mg total) by mouth every 6 (six) hours as needed for nausea. Patient not taking: Reported on 08/21/2024 04/27/24   Jonel Lonni SQUIBB, MD  prochlorperazine (COMPAZINE) 10 MG tablet Take 1 tablet (10 mg total) by mouth every 6 (six) hours as needed for nausea or vomiting. Patient not taking: Reported on 08/21/2024 08/15/24   Pasam, Chinita, MD  traMADol  (ULTRAM ) 50 MG tablet Take 1 tablet (50 mg total) by mouth every 6 (six) hours as needed. 08/15/24   Autumn Chinita, MD    Physical Exam: Vitals:   08/21/24 0800 08/21/24 0830 08/21/24 0900 08/21/24 0930  BP: (!) 152/79 (!) 141/74 130/70 110/60  Pulse: 74 72 69 75  Resp: 13 14 14 15   Temp:      TempSrc:       SpO2: 99% 98% 99% 98%   Physical Exam Vitals reviewed.  Constitutional:      Appearance: She is obese.  HENT:     Head: Normocephalic and atraumatic.     Nose: Nose normal.     Mouth/Throat:     Mouth: Mucous membranes are moist.  Eyes:     Pupils: Pupils are equal, round, and reactive to light.  Cardiovascular:     Rate and Rhythm: Normal rate and regular rhythm.  Pulmonary:     Effort: Pulmonary effort is normal.  Abdominal:     General: Bowel sounds are normal.  Musculoskeletal:        General: Normal range of motion.  Skin:    General: Skin is warm.  Neurological:     General: No focal deficit present.  Psychiatric:        Mood and Affect: Mood normal.     Data Reviewed:  There are no new results to review at this time.  CT ABDOMEN PELVIS W CONTRAST EXAM: CT ABDOMEN AND PELVIS WITH CONTRAST 08/21/2024 01:01:53 AM  TECHNIQUE: CT of the abdomen and pelvis was performed with the administration of 100 mL of iohexol  (OMNIPAQUE ) 300 MG/ML solution. Multiplanar reformatted images are provided for review. Automated exposure control, iterative reconstruction, and/or weight-based adjustment of the mA/kV was utilized to reduce the radiation dose to as low as reasonably achievable.  COMPARISON: 04/19/2024, PET CT 08/08/24  CLINICAL HISTORY: Abdominal pain, acute, nonlocalized; RUQ, epigastric TTP, hx pancreatic cancer with mets to liver, starting chemo soon.  FINDINGS:  LOWER CHEST: No acute abnormality.  LIVER: Numerous low density masses throughout the liver compatible with metastases. Largest mass centrally in the liver measures up to 4.1 cm.  GALLBLADDER AND BILE DUCTS: Gallbladder is unremarkable. No biliary ductal dilatation.  SPLEEN: No acute abnormality.  PANCREAS: Low density ill defined mass in the pancreatic body measures 2.9 cm. This is compatible with the patient's known pancreatic cancer .  ADRENAL GLANDS: No acute  abnormality.  KIDNEYS, URETERS AND BLADDER: No stones in the kidneys or ureters. No hydronephrosis. No perinephric or periureteral stranding. Urinary bladder is unremarkable.  GI AND BOWEL: Stomach demonstrates no acute abnormality. There is no bowel obstruction.  PERITONEUM AND RETROPERITONEUM: No ascites. No free air.  VASCULATURE: Aorta is normal in caliber.  LYMPH NODES: No lymphadenopathy.  REPRODUCTIVE ORGANS: No acute abnormality.  BONES AND SOFT TISSUES: No acute osseous abnormality. No focal soft tissue abnormality.  IMPRESSION: 1. Numerous hepatic metastases, largest 4.1 cm; progressive metastatic disease. 2. Pancreatic body mass 2.9 cm corresponding to known pancreatic cancer .  Electronically signed by: Franky Crease MD 08/21/2024 01:29 AM EST RP Workstation: [id number]  Lab Results  Component Value Date   WBC 7.2 08/20/2024   HGB 13.1 08/20/2024   HCT 43.1 08/20/2024   MCV 84.7 08/20/2024   PLT 266 08/20/2024   Last metabolic panel Lab Results  Component Value Date   GLUCOSE 160 (H) 08/20/2024   NA 140 08/20/2024   K 3.7 08/20/2024   CL 100 08/20/2024   CO2 29 08/20/2024   BUN 6 08/20/2024   CREATININE 0.50 08/20/2024   GFRNONAA >60 08/20/2024   CALCIUM  9.9 08/20/2024   PROT 8.0 08/20/2024   ALBUMIN 4.3 08/20/2024   LABGLOB 1.8 04/26/2023   BILITOT 0.3 08/20/2024   ALKPHOS 434 (H) 08/20/2024   AST 50 (H) 08/20/2024   ALT 60 (H) 08/20/2024   ANIONGAP 11 08/20/2024    Assessment and Plan: Acute pancreatitis Progressive central abdominal pain w/ worsening nausea and decreased po intake over the past 2-3 days  Noted baseline Primary malignant neuroendocrine tumor of pancreas followed by outpatient hem- onc  Lipase 607 CT imaging with Pancreatic body mass 2.9 cm corresponding to known pancreatic cancer No noted biliary obstruction  Will plan on pain control IVF  Antiemetics  Advance diet as tolerated  Otherwise monitor closely    Pancreatic cancer (HCC) Noted baseline  primary malignant neuroendocrine tumor of pancreas  CT imaging today with n umerous hepatic metastases, largest 4.1 cm concerning for progressive metastatic disease and pancreatic body mass 2.9 cm corresponding to known pancreatic cancer Followed by Dr. Autumn outpatient  Was due for chemotherapy tomorrow per report  Will let Dr.Pasam know of admission as an FYI  Follow closely  Transaminitis AST 50  ALT 60  ALP 434  No overt biliary obstruction noted on imaging Likely secondary to metastatic pancreatic disease (numerous hepatic mets on CT) Monitor   Mood disorder Stable on Bupropion , Seroquel , Lamictal    GERD (gastroesophageal reflux disease) PPI   Dyslipidemia Continue statin    Essential hypertension BP stable  Titrate home regimen        Advance Care Planning:   Code Status: Full Code   Consults: Hem-onc aware of admission   Family Communication: Family at the bedside   Severity of Illness: The appropriate patient status for this patient is OBSERVATION. Observation status is judged to be reasonable and necessary in order to provide the required intensity of service to ensure the patient's safety. The patient's presenting symptoms, physical exam findings, and initial radiographic and laboratory data in the context of their medical condition is felt to place them at decreased risk for further clinical deterioration. Furthermore, it is anticipated that the patient will be medically stable for discharge from the hospital within 2 midnights of admission.   Author: Elspeth JINNY Masters, MD 08/21/2024 10:51 AM  For on call review [email or web address].

## 2024-08-21 NOTE — TOC Initial Note (Signed)
 Transition of Care [facility]) - Initial/Assessment Note    Patient Details  Name: Jo Allen MRN: [id number] Date of Birth: [birth year]  Transition of Care [facility]) CM/SW Contact:    Toy LITTIE Agar, RN Phone Number:[phone]  08/21/2024, 12:34 PM  Clinical Narrative:                 Inpatient care manager acknowledges consult for Home Health / DME Needs. Currently no needs are noted. CM will follow.         Patient Goals and CMS Choice            Expected Discharge Plan and Services                                              Prior Living Arrangements/Services                       Activities of Daily Living   ADL Screening (condition at time of admission) Independently performs ADLs?: Yes (appropriate for developmental age) Is the patient deaf or have difficulty hearing?: No Does the patient have difficulty seeing, even when wearing glasses/contacts?: No Does the patient have difficulty concentrating, remembering, or making decisions?: No  Permission Sought/Granted                  Emotional Assessment              Admission diagnosis:  Acute pancreatitis [K85.90] Epigastric pain [R10.13] Primary pancreatic cancer with metastasis to other site [facility]) [C25.9] Acute pancreatitis without infection or necrosis, unspecified pancreatitis type [K85.90] Nausea and vomiting, unspecified vomiting type [R11.2] Patient Active Problem List   Diagnosis Date Noted   Acute pancreatitis 08/21/2024   Pancreatic cancer (HCC) 08/21/2024   Mood disorder 08/21/2024   Cancer associated pain 08/15/2024   Primary malignant neuroendocrine tumor of pancreas (HCC) 07/26/2024   Pancreatic mass 04/20/2024   Essential hypertension 04/20/2024   Dyslipidemia 04/20/2024   GERD (gastroesophageal reflux disease) 04/20/2024   Morbid obesity with BMI of 40.0-44.9, adult (HCC) 04/20/2024   Angina pectoris 04/29/2023   Hematoma of right forearm 04/29/2023    Numbness and tingling in left hand 07/09/2021   Chronic back pain 07/09/2021   Bilateral headaches 07/09/2021   Trigeminal neuralgia of left side of face 07/09/2021   Other chronic pain 07/09/2021   Paresthesia 08/20/2015   PCP:  [name], PA Pharmacy:   [id number] [place], [place] - [address] AT [facility] & [address] [place] [address] Phone: [phone] Fax: [phone]  [place] [facility] [address] [place] [address] Phone: [phone] Fax: [phone]     Social Drivers of Health (SDOH) Social History: SDOH Screenings   Food Insecurity: No Food Insecurity (08/21/2024)  Housing: Low Risk  (08/21/2024)  Transportation Needs: No Transportation Needs (08/21/2024)  Utilities: Not At Risk (08/21/2024)  Depression (PHQ2-9): Low Risk  (08/15/2024)  Financial Resource Strain: Low Risk  (12/19/2023)   Received from [facility]  Physical Activity: Unknown (11/02/2022)   Received from [facility]  Social Connections: Socially Integrated (11/02/2022)   Received from [facility]  Stress: Stress Concern Present (11/02/2022)   Received from [facility]  Tobacco Use: Low Risk  (08/20/2024)   SDOH Interventions:  Readmission Risk Interventions     No data to display

## 2024-08-21 NOTE — Assessment & Plan Note (Signed)
 BP stable Titrate home regimen

## 2024-08-21 NOTE — Assessment & Plan Note (Signed)
 Stable on Bupropion , Seroquel , Lamictal 

## 2024-08-21 NOTE — Assessment & Plan Note (Addendum)
 AST 50  ALT 60  ALP 434  No overt biliary obstruction noted on imaging Likely secondary to metastatic pancreatic disease (numerous hepatic mets on CT) Monitor

## 2024-08-21 NOTE — Progress Notes (Addendum)
 ADDENDUM:  Patient was personally and independently interviewed, examined and relevant elements of the history of present illness were reviewed in details and an assessment and plan was created. All elements of the patient's history of present illness, assessment and plan were discussed in detail with Jo JINNY Brunner, NP. The above documentation reflects our combined findings assessment and plan.   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  She was seen in our clinic last week and plan was to start her on palliative systemic treatments with cisplatin and etoposide starting from tomorrow, 08/22/2024.  However patient got hospitalized today with intractable pain.  Please consult IR for Port-A-Cath placement.  Will coordinate nursing staff availability and plan to begin systemic chemotherapy in the hospital, if approved by the committee.  Continue aggressive pain management.  Slowly rising AST, ALT and alkaline phosphatase noted, from tumor burden.  Will continue to follow.  Please call us  with any questions.  Jo Allen   DOB:[birth year]   FM#:[id number]      ASSESSMENT & PLAN:  Jo Allen is a 58 year old female with oncologic history significant for pancreatic cancer.  She was admitted at 08/21/2024 with complaints of worsening abdominal pain.  Medical oncology following.  Pancreatic neuroendocrine tumor with hepatic mets - Diagnosed 07/04/2024 with poorly differentiated carcinoma with neuroendocrine differentiation. - PET scan done 08/08/2024 which showed multiple hepatic lesions. - Chemotherapy was planned with cisplatin and etoposide for 6-8 cycles with restaging every 3 cycles.  Chemotherapy was due as outpatient this week however due to hospital admission, there is consideration to give chemotherapy during  this admission. - Medical oncology/Dr. Rether Allen following closely and will make further treatment recommendations.  Abdominal pain, worsening - Likely cancer associated pain - Continue pain medication regimen - Continue supportive care  Transaminitis - Elevated AST/ALT noted 08/20/2024.  Higher than baseline. - Total bili WNL - Avoid hepatotoxic agents - Continue to monitor liver function  Hypertension Hyperlipidemia - On statin - Continue antihypertensives as ordered  Code Status Full   Subjective:  Patient seen awake and alert laying in bed.  Reports that she came in due to increasing abdominal pain.  States that currently pain meds helping with this.  Denies other acute complaints.  Objective:   Intake/Output Summary (Last 24 hours) at 08/21/2024 1057 Last data filed at 08/21/2024 0955 Gross per 24 hour  Intake 352.8 ml  Output --  Net 352.8 ml     PHYSICAL EXAMINATION: ECOG PERFORMANCE STATUS: 1 - Symptomatic but completely ambulatory  Vitals:   08/21/24 0900 08/21/24 0930  BP: 130/70 110/60  Pulse: 69 75  Resp: 14 15  Temp:    SpO2: 99% 98%   There were no vitals filed for this visit.  GENERAL: alert, no distress and comfortable SKIN: skin color, texture, turgor are normal, no rashes or significant lesions EYES: normal, conjunctiva are pink and non-injected, sclera clear OROPHARYNX: no exudate, no erythema and lips, buccal mucosa, and tongue normal  NECK: supple, thyroid  normal size, non-tender, without nodularity LYMPH: no palpable lymphadenopathy in the cervical, axillary or inguinal LUNGS: clear to auscultation and percussion with normal breathing effort HEART: regular rate & rhythm and no murmurs and no lower extremity edema ABDOMEN: abdomen soft, non-tender and normal bowel sounds MUSCULOSKELETAL: no cyanosis of digits and no clubbing  PSYCH: alert & oriented x 3 with fluent speech NEURO: no  focal motor/sensory deficits   All questions were  answered. The patient knows to call the clinic with any problems, questions or concerns.   The total time spent in the appointment was 40 minutes encounter with patient including review of chart and various tests results, discussions about plan of care and coordination of care plan  Jo JINNY Brunner, NP 08/21/2024 10:57 AM    Labs Reviewed:  Lab Results  Component Value Date   WBC 7.2 08/20/2024   HGB 13.1 08/20/2024   HCT 43.1 08/20/2024   MCV 84.7 08/20/2024   PLT 266 08/20/2024   Recent Labs    07/26/24 1227 08/15/24 0758 08/20/24 1921  NA 142 140 140  K 3.6 3.8 3.7  CL 105 103 100  CO2 32 30 29  GLUCOSE 107* 117* 160*  BUN 10 <5* 6  CREATININE 0.66 0.71 0.50  CALCIUM  10.1 9.7 9.9  GFRNONAA >60 >60 >60  PROT 7.6 7.3 8.0  ALBUMIN 4.2 4.0 4.3  AST 23 27 50*  ALT 19 30 60*  ALKPHOS 197* 321* 434*  BILITOT 0.3 0.4 0.3    Studies Reviewed:  CT ABDOMEN PELVIS W CONTRAST Result Date: 08/21/2024 EXAM: CT ABDOMEN AND PELVIS WITH CONTRAST 08/21/2024 01:01:53 AM TECHNIQUE: CT of the abdomen and pelvis was performed with the administration of 100 mL of iohexol  (OMNIPAQUE ) 300 MG/ML solution. Multiplanar reformatted images are provided for review. Automated exposure control, iterative reconstruction, and/or weight-based adjustment of the mA/kV was utilized to reduce the radiation dose to as low as reasonably achievable. COMPARISON: 04/19/2024, PET CT 08/08/24 CLINICAL HISTORY: Abdominal pain, acute, nonlocalized; RUQ, epigastric TTP, hx pancreatic cancer with mets to liver, starting chemo soon. FINDINGS: LOWER CHEST: No acute abnormality. LIVER: Numerous low density masses throughout the liver compatible with metastases. Largest mass centrally in the liver measures up to 4.1 cm. GALLBLADDER AND BILE DUCTS: Gallbladder is unremarkable. No biliary ductal dilatation. SPLEEN: No acute abnormality. PANCREAS: Low density ill defined mass in the pancreatic body measures 2.9 cm. This is compatible  with the patient's known pancreatic cancer . ADRENAL GLANDS: No acute abnormality. KIDNEYS, URETERS AND BLADDER: No stones in the kidneys or ureters. No hydronephrosis. No perinephric or periureteral stranding. Urinary bladder is unremarkable. GI AND BOWEL: Stomach demonstrates no acute abnormality. There is no bowel obstruction. PERITONEUM AND RETROPERITONEUM: No ascites. No free air. VASCULATURE: Aorta is normal in caliber. LYMPH NODES: No lymphadenopathy. REPRODUCTIVE ORGANS: No acute abnormality. BONES AND SOFT TISSUES: No acute osseous abnormality. No focal soft tissue abnormality. IMPRESSION: 1. Numerous hepatic metastases, largest 4.1 cm; progressive metastatic disease. 2. Pancreatic body mass 2.9 cm corresponding to known pancreatic cancer . Electronically signed by: Franky Crease MD 08/21/2024 01:29 AM EST RP Workstation: [id number]   NM PET Image Initial (PI) Skull Base To Thigh Result Date: 08/11/2024 EXAM: PET AND CT SKULL BASE TO MID THIGH 08/08/2024 95:73:75 PM TECHNIQUE: RADIOPHARMACEUTICAL: 10.70 mCi F-18 FDG Uptake time 60 minutes. Glucose level 108 mg/dl. PET imaging was acquired from the base of the skull to the mid thighs. Non-contrast enhanced computed tomography was obtained for attenuation correction and anatomic localization. COMPARISON: MRI dated 04/19/2024. CLINICAL HISTORY: Poorly differentiated carcinoma noted on liver biopsy, neuroendocrine features. Needs staging. Table formatting from the original note was not included. 10.70 mCi F-18 FDG @ 1514 IV in RT HAND//BWM-KC. FBG: 108 mg/dL. 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 Needs staging. FINDINGS: HEAD AND NECK: No metabolically active cervical lymphadenopathy. CHEST: No metabolically active  pulmonary nodules. No lung metastasis. No metabolically active lymphadenopathy. ABDOMEN AND PELVIS: There are multiple intense metabolic activity within the liver, dominant  lesion in the central left hepatic lobe with SUV max equal to 10.1. This lesion is subtly evident on noncontrast CT measuring 18 mm on image 91. Additional lesion in the posterior left hepatic lobe on image 103. Additional lesion lateral segment of the left hepatic lobe on imaging 108. The lesions appear progressed from MRI April 19, 2024. Within the central body of the pancreas, there is an intense focus of metabolic activity with SUV max equal to 9.3 image 108. This corresponds to focal enlargement of the mid body of the pancreas to 4.3 x 2.8 cm on image 109. There is interval resolution of the distal pancreatitis seen on comparison CT and MRI. No hypermetabolic abdominal lymph nodes are identified. The uterus and ovaries are normal. Physiologic activity within the gastrointestinal and genitourinary systems. BONES AND SOFT TISSUE: No abnormal FDG activity localizes to the bones. No metabolically active aggressive osseous lesion. IMPRESSION: 1. Hepatic lesions with intense metabolic activity, progressed from April 19, 2024 MRI, consistent with metastatic disease. 2. Hypermetabolic mass in the pancreatic body measuring approximately 4.3 x 2.8 cm. Presumed primary malignancy . 3. No hypermetabolic abdominal lymph nodes. 4. No skeletal metastasis. 5. No lung metastasis. 6. Interval resolution of distal pancreatitis. Electronically signed by: Norleen Boxer MD 08/11/2024 09:30 AM EDT RP Workstation: [id number]

## 2024-08-21 NOTE — Assessment & Plan Note (Signed)
 Noted baseline  primary malignant neuroendocrine tumor of pancreas  CT imaging today with n umerous hepatic metastases, largest 4.1 cm concerning for progressive metastatic disease and pancreatic body mass 2.9 cm corresponding to known pancreatic cancer Followed by Dr. Autumn outpatient  Was due for chemotherapy tomorrow per report  Will let Dr.Pasam know of admission as an FYI  Follow closely

## 2024-08-21 NOTE — ED Notes (Signed)
 Patient placed on 2L [place] as patient O2 dropped when sleeping

## 2024-08-21 NOTE — ED Notes (Signed)
 ED TO INPATIENT HANDOFF REPORT  Name/Age/Gender Jo Allen 58 y.o. female  Code Status Code Status History     Date Active Date Inactive Code Status Order ID Comments User Context   04/19/2024 1025 04/27/2024 1655 Full Code [id number]  Zella Katha HERO, MD ED   04/29/2023 0847 04/30/2023 1450 Full Code [id number]  Ladona Heinz, MD Inpatient    Questions for Most Recent Historical Code Status (Order [id number])     Question Answer   By: Consent: discussion documented in EHR            Home/SNF/Other Home  Chief Complaint Acute pancreatitis [K85.90]  Level of Care/Admitting Diagnosis ED Disposition     ED Disposition  Admit   Condition  --   Comment  Hospital Area: [facility] [[id number]]  Level of Care: Med-Surg [16]  May admit patient to [facility] or [facility] if equivalent level of care is available:: No  Diagnosis: Acute pancreatitis [577.0.ICD-9-CM]  Admitting Physician: CHARLTON EVALENE RAMAN [[id number]]  Attending Physician: CHARLTON EVALENE RAMAN [[id number]]  Certification:: I certify this patient will need inpatient services for at least 2 midnights  Expected Medical Readiness: 08/24/2024          Medical History Past Medical History:  Diagnosis Date   Acute pancreatitis 04/19/2024   Asthma    CAD (coronary artery disease)    GERD (gastroesophageal reflux disease)    Headache    Heart disease    Hypertension    Obesity    Paresthesia 08/20/2015   Prediabetes    Transfusion history    80yrs ago    Allergies Allergies  Allergen Reactions   Morphine  And Codeine Nausea And Vomiting, Hives and Itching   Trazodone Nausea Only    dizziness, headache   Penicillins Hives    IV Location/Drains/Wounds Patient Lines/Drains/Airways Status     Active Line/Drains/Airways     Name Placement date Placement time Site Days   Peripheral IV 08/20/24 20 G Right Antecubital 08/20/24  2250  Antecubital  1            Labs/Imaging Results for  orders placed or performed during the hospital encounter of 08/20/24 (from the past 48 hours)  Lipase, blood     Status: Abnormal   Collection Time: 08/20/24  7:21 PM  Result Value Ref Range   Lipase 607 (H) 11 - 51 U/L    Comment: Performed at [facility], [address], [place], [place] [address]  Comprehensive metabolic panel     Status: Abnormal   Collection Time: 08/20/24  7:21 PM  Result Value Ref Range   Sodium 140 135 - 145 mmol/L   Potassium 3.7 3.5 - 5.1 mmol/L   Chloride 100 98 - 111 mmol/L   CO2 29 22 - 32 mmol/L   Glucose, Bld 160 (H) 70 - 99 mg/dL    Comment: Glucose reference range applies only to samples taken after fasting for at least 8 hours.   BUN 6 6 - 20 mg/dL   Creatinine, Ser 9.49 0.44 - 1.00 mg/dL   Calcium  9.9 8.9 - 10.3 mg/dL   Total Protein 8.0 6.5 - 8.1 g/dL   Albumin 4.3 3.5 - 5.0 g/dL   AST 50 (H) 15 - 41 U/L   ALT 60 (H) 0 - 44 U/L   Alkaline Phosphatase 434 (H) 38 - 126 U/L   Total Bilirubin 0.3 0.0 - 1.2 mg/dL   GFR, Estimated >39 >39 mL/min  Comment: (NOTE) Calculated using the CKD-EPI Creatinine Equation (2021)    Anion gap 11 5 - 15    Comment: Performed at [facility], [address], [place], [place] [address]  CBC     Status: Abnormal   Collection Time: 08/20/24  7:21 PM  Result Value Ref Range   WBC 7.2 4.0 - 10.5 K/uL    Comment: WHITE COUNT CONFIRMED ON SMEAR   RBC 5.09 3.87 - 5.11 MIL/uL   Hemoglobin 13.1 12.0 - 15.0 g/dL   HCT 56.8 63.9 - 53.9 %   MCV 84.7 80.0 - 100.0 fL   MCH 25.7 (L) 26.0 - 34.0 pg   MCHC 30.4 30.0 - 36.0 g/dL   RDW 87.1 88.4 - 84.4 %   Platelets 266 150 - 400 K/uL   nRBC 0.0 0.0 - 0.2 %    Comment: Performed at [facility], [address] [place], [place] [address]  Troponin T, High Sensitivity     Status: None   Collection Time: 08/20/24  7:21 PM  Result Value Ref Range   Troponin T High Sensitivity <15 0 - 19 ng/L    Comment:  (NOTE) Biotin  concentrations > 1000 ng/mL falsely decrease TnT results.  Serial cardiac troponin measurements are suggested.  Refer to the Links section for chest pain algorithms and additional  guidance. Performed at [facility], [address], [place], [place] [address]   Resp panel by RT-PCR (RSV, Flu A&B, Covid) Anterior Nasal Swab     Status: None   Collection Time: 08/20/24 11:12 PM   Specimen: Anterior Nasal Swab  Result Value Ref Range   SARS Coronavirus 2 by RT PCR NEGATIVE NEGATIVE    Comment: (NOTE) SARS-CoV-2 target nucleic acids are NOT DETECTED.  The SARS-CoV-2 RNA is generally detectable in upper respiratory specimens during the acute phase of infection. The lowest concentration of SARS-CoV-2 viral copies this assay can detect is 138 copies/mL. A negative result does not preclude SARS-Cov-2 infection and should not be used as the sole basis for treatment or other patient management decisions. A negative result may occur with  improper specimen collection/handling, submission of specimen other than nasopharyngeal swab, presence of viral mutation(s) within the areas targeted by this assay, and inadequate number of viral copies(<138 copies/mL). A negative result must be combined with clinical observations, patient history, and epidemiological information. The expected result is Negative.  Fact Sheet for Patients:  bloggercourse.com  Fact Sheet for Healthcare Providers:  seriousbroker.it  This test is no t yet approved or cleared by the [place]  FDA and  has been authorized for detection and/or diagnosis of SARS-CoV-2 by FDA under an Emergency Use Authorization (EUA). This EUA will remain  in effect (meaning this test can be used) for the duration of the COVID-19 declaration under Section 564(b)(1) of the Act, 21 U.S.C.section 360bbb-3(b)(1), unless the authorization is terminated  or  revoked sooner.       Influenza A by PCR NEGATIVE NEGATIVE   Influenza B by PCR NEGATIVE NEGATIVE    Comment: (NOTE) The Xpert Xpress SARS-CoV-2/FLU/RSV plus assay is intended as an aid in the diagnosis of influenza from Nasopharyngeal swab specimens and should not be used as a sole basis for treatment. Nasal washings and aspirates are unacceptable for Xpert Xpress SARS-CoV-2/FLU/RSV testing.  Fact Sheet for Patients: bloggercourse.com  Fact Sheet for Healthcare Providers: seriousbroker.it  This test is not yet approved or cleared by the [place]  FDA and has been  authorized for detection and/or diagnosis of SARS-CoV-2 by FDA under an Emergency Use Authorization (EUA). This EUA will remain in effect (meaning this test can be used) for the duration of the COVID-19 declaration under Section 564(b)(1) of the Act, 21 U.S.C. section 360bbb-3(b)(1), unless the authorization is terminated or revoked.     Resp Syncytial Virus by PCR NEGATIVE NEGATIVE    Comment: (NOTE) Fact Sheet for Patients: bloggercourse.com  Fact Sheet for Healthcare Providers: seriousbroker.it  This test is not yet approved or cleared by the [place]  FDA and has been authorized for detection and/or diagnosis of SARS-CoV-2 by FDA under an Emergency Use Authorization (EUA). This EUA will remain in effect (meaning this test can be used) for the duration of the COVID-19 declaration under Section 564(b)(1) of the Act, 21 U.S.C. section 360bbb-3(b)(1), unless the authorization is terminated or revoked.  Performed at [facility], [address] [place], [place] [address]   Urinalysis, Routine w reflex microscopic -Urine, Clean Catch     Status: None   Collection Time: 08/21/24  6:30 AM  Result Value Ref Range   Color, Urine YELLOW YELLOW   APPearance CLEAR CLEAR   Specific Gravity,  Urine 1.015 1.005 - 1.030   pH 5.0 5.0 - 8.0   Glucose, UA NEGATIVE NEGATIVE mg/dL   Hgb urine dipstick NEGATIVE NEGATIVE   Bilirubin Urine NEGATIVE NEGATIVE   Ketones, ur NEGATIVE NEGATIVE mg/dL   Protein, ur NEGATIVE NEGATIVE mg/dL   Nitrite NEGATIVE NEGATIVE   Leukocytes,Ua NEGATIVE NEGATIVE   RBC / HPF 0-5 0 - 5 RBC/hpf   WBC, UA 0-5 0 - 5 WBC/hpf   Bacteria, UA NONE SEEN NONE SEEN   Squamous Epithelial / HPF 0-5 0 - 5 /HPF   Mucus PRESENT     Comment: Performed at [facility], [address] [place], [place] [address]   CT ABDOMEN PELVIS W CONTRAST Result Date: 08/21/2024 EXAM: CT ABDOMEN AND PELVIS WITH CONTRAST 08/21/2024 01:01:53 AM TECHNIQUE: CT of the abdomen and pelvis was performed with the administration of 100 mL of iohexol  (OMNIPAQUE ) 300 MG/ML solution. Multiplanar reformatted images are provided for review. Automated exposure control, iterative reconstruction, and/or weight-based adjustment of the mA/kV was utilized to reduce the radiation dose to as low as reasonably achievable. COMPARISON: 04/19/2024, PET CT 08/08/24 CLINICAL HISTORY: Abdominal pain, acute, nonlocalized; RUQ, epigastric TTP, hx pancreatic cancer with mets to liver, starting chemo soon. FINDINGS: LOWER CHEST: No acute abnormality. LIVER: Numerous low density masses throughout the liver compatible with metastases. Largest mass centrally in the liver measures up to 4.1 cm. GALLBLADDER AND BILE DUCTS: Gallbladder is unremarkable. No biliary ductal dilatation. SPLEEN: No acute abnormality. PANCREAS: Low density ill defined mass in the pancreatic body measures 2.9 cm. This is compatible with the patient's known pancreatic cancer . ADRENAL GLANDS: No acute abnormality. KIDNEYS, URETERS AND BLADDER: No stones in the kidneys or ureters. No hydronephrosis. No perinephric or periureteral stranding. Urinary bladder is unremarkable. GI AND BOWEL: Stomach demonstrates no acute abnormality. There is no bowel  obstruction. PERITONEUM AND RETROPERITONEUM: No ascites. No free air. VASCULATURE: Aorta is normal in caliber. LYMPH NODES: No lymphadenopathy. REPRODUCTIVE ORGANS: No acute abnormality. BONES AND SOFT TISSUES: No acute osseous abnormality. No focal soft tissue abnormality. IMPRESSION: 1. Numerous hepatic metastases, largest 4.1 cm; progressive metastatic disease. 2. Pancreatic body mass 2.9 cm corresponding to known pancreatic cancer . Electronically signed by: Franky Crease MD 08/21/2024 01:29 AM EST RP Workstation: [id number]  Pending Labs Unresulted Labs (From admission, onward)    None       Vitals/Pain Today's Vitals   08/21/24 0830 08/21/24 0900 08/21/24 0930 08/21/24 0956  BP: (!) 141/74 130/70 110/60   Pulse: 72 69 75   Resp: 14 14 15    Temp:      TempSrc:      SpO2: 98% 99% 98%   PainSc:    2     Isolation Precautions No active isolations  Medications Medications  HYDROmorphone  (DILAUDID ) injection 0.5 mg (0.5 mg Intravenous Given 08/21/24 0602)  oxyCODONE  (Oxy IR/ROXICODONE ) immediate release tablet 5 mg (has no administration in time range)  acetaminophen  (TYLENOL ) tablet 650 mg (has no administration in time range)  prochlorperazine (COMPAZINE) injection 5 mg (5 mg Intravenous Given 08/21/24 0824)  0.9 %  sodium chloride  infusion ( Intravenous Infusion Verify 08/21/24 0955)  ondansetron  (ZOFRAN ) injection 4 mg (has no administration in time range)  sodium chloride  0.9 % bolus 1,000 mL (0 mLs Intravenous Stopped 08/21/24 0314)  ondansetron  (ZOFRAN ) injection 4 mg (4 mg Intravenous Given 08/20/24 2256)  HYDROmorphone  (DILAUDID ) injection 0.5 mg (0.5 mg Intravenous Given 08/20/24 2257)  iohexol  (OMNIPAQUE ) 300 MG/ML solution 100 mL (100 mLs Intravenous Contrast Given 08/21/24 0044)  HYDROmorphone  (DILAUDID ) injection 0.5 mg (0.5 mg Intravenous Given 08/21/24 0110)  HYDROmorphone  (DILAUDID ) injection 0.5 mg (0.5 mg Intravenous Given 08/21/24 0246)    Mobility walks with  person assist

## 2024-08-21 NOTE — Assessment & Plan Note (Signed)
 PPI

## 2024-08-22 ENCOUNTER — Inpatient Hospital Stay

## 2024-08-22 DIAGNOSIS — K859 Acute pancreatitis without necrosis or infection, unspecified: Secondary | ICD-10-CM | POA: Diagnosis not present

## 2024-08-22 DIAGNOSIS — C7A1 Malignant poorly differentiated neuroendocrine tumors: Secondary | ICD-10-CM | POA: Diagnosis not present

## 2024-08-22 DIAGNOSIS — G893 Neoplasm related pain (acute) (chronic): Secondary | ICD-10-CM | POA: Diagnosis not present

## 2024-08-22 DIAGNOSIS — C259 Malignant neoplasm of pancreas, unspecified: Secondary | ICD-10-CM | POA: Diagnosis not present

## 2024-08-22 LAB — COMPREHENSIVE METABOLIC PANEL WITH GFR
ALT: 43 U/L (ref 0–44)
AST: 38 U/L (ref 15–41)
Albumin: 3.4 g/dL — ABNORMAL LOW (ref 3.5–5.0)
Alkaline Phosphatase: 336 U/L — ABNORMAL HIGH (ref 38–126)
Anion gap: 8 (ref 5–15)
BUN: 5 mg/dL — ABNORMAL LOW (ref 6–20)
CO2: 30 mmol/L (ref 22–32)
Calcium: 9.4 mg/dL (ref 8.9–10.3)
Chloride: 101 mmol/L (ref 98–111)
Creatinine, Ser: 0.53 mg/dL (ref 0.44–1.00)
GFR, Estimated: >60 mL/min (ref >60)
Glucose, Bld: 107 mg/dL — ABNORMAL HIGH (ref 70–99)
Potassium: 3.5 mmol/L (ref 3.5–5.1)
Sodium: 140 mmol/L (ref 135–145)
Total Bilirubin: 0.4 mg/dL (ref 0.0–1.2)
Total Protein: 6.3 g/dL — ABNORMAL LOW (ref 6.5–8.1)

## 2024-08-22 LAB — CBC
HCT: 40 % (ref 36.0–46.0)
Hemoglobin: 12 g/dL (ref 12.0–15.0)
MCH: 26.5 pg (ref 26.0–34.0)
MCHC: 30 g/dL (ref 30.0–36.0)
MCV: 88.3 fL (ref 80.0–100.0)
Platelets: 196 K/uL (ref 150–400)
RBC: 4.53 MIL/uL (ref 3.87–5.11)
RDW: 13 % (ref 11.5–15.5)
WBC: 4.8 K/uL (ref 4.0–10.5)
nRBC: 0 % (ref 0.0–0.2)

## 2024-08-22 LAB — LIPASE, BLOOD: Lipase: 534 U/L — ABNORMAL HIGH (ref 11–51)

## 2024-08-22 MED ORDER — POLYETHYLENE GLYCOL 3350 17 G PO PACK
17.0000 g | PACK | Freq: Every day | ORAL | Status: DC | PRN
  Administered 2024-08-22 – 2024-08-25 (×2): 17 g via ORAL
  Filled 2024-08-22 (×2): qty 1

## 2024-08-22 MED ORDER — LACTATED RINGERS IV SOLN: INTRAVENOUS | Status: AC

## 2024-08-22 MED ORDER — ENOXAPARIN SODIUM 40 MG/0.4ML IJ SOSY
40.0000 mg | PREFILLED_SYRINGE | INTRAMUSCULAR | Status: DC
  Administered 2024-08-24 – 2024-08-25 (×2): 40 mg via SUBCUTANEOUS
  Filled 2024-08-22 (×2): qty 0.4

## 2024-08-22 NOTE — Plan of Care (Signed)

## 2024-08-22 NOTE — Progress Notes (Signed)
 PT Cancellation Note  Patient Details Name: Jo Allen MRN: [id number] DOB: December 19, 1965   Cancelled Treatment:    Reason Eval/Treat Not Completed: Patient declined, no reason specified Has been up ad lib in room, getting ready to have something to eat. Will check back another time. SABRA [name] PT Acute Rehabilitation Services Office [phone]   [name] 08/22/2024, 3:50 PM

## 2024-08-22 NOTE — Progress Notes (Signed)
 [facility]  HEMATOLOGY/ONCOLOGY IN-PATIENT PROGRESS NOTE   PATIENT NAME: Jo Allen   MR#: [id number] DOB: 1966/06/09 CSN#: [id number]   DATE OF SERVICE: 08/22/2024  ASSESSMENT & PLAN:   Primary malignant neuroendocrine tumor of pancreas [facility]) Please review oncology history for additional details and timeline of events.   Biopsies from pancreatic tail lesion showed malignant cells.  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 Immunohistochemical stains were performed to characterize the tumor cells. The cells are positive for CK7, PAX8, TTF-1, synaptophysin and chromogranin A. The cells are negative for CK20, thyroglobulin, CDX2, GATA3, CK5/6, p40, D2-40, calretinin, WT1, HepPar 1, and Glypican 3. P53 is mutant type of staining. Ki67 proliferation index is approximately 50%.  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 The differential diagnosis includes metastatic high-grade neuroendocrine carcinoma from elsewhere in the body including pancreas (favored), gastrointestinal tract, lung, gynecological system, pancreatic acinar cell carcinoma or other types of carcinomas with neuroendocrine differentiation (solid pseudopapillary tumor with malignant transformation).    Previously I discussed the diagnosis, prognosis, plan of care, treatment options with the patient and her son who was accompanying.  Reviewed NCCN guidelines and discussed management options.   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  Recommended palliative systemic chemotherapy with cisplatin and etoposide. She has previously expressed reservations about chemotherapy, preferring to rely on spiritual beliefs for healing. Without chemotherapy, the disease is expected to progress and worsen,  potentially affecting other organs. Surgery is not feasible due to multiple liver metastases. She discussed this further with rest of the family members and her pastor before making final decision.   On her consultation with us  on 07/26/2024, we submitted request for staging PET scan.  On 08/08/2024, PET scan showed multiple hepatic lesions with intense metabolic activity, progressed from July 2025, consistent with metastatic disease.  Hypermetabolic mass in the pancreatic body measuring approximately 4.3 x 2.8 cm, presumed primary malignancy.  No hypermetabolic abdominal lymph nodes.  No lung, skeletal mets.   Patient is now agreeable to proceeding with systemic treatments.  Discussed treatment options after reviewing NCCN guidelines.  Plan made to proceed with cisplatin and etoposide.  Cisplatin will be given on day 1, etoposide on days 1-3 of 21-day cycle.  Plan to continue for 6-8 cycles with restaging imaging after every 3 cycles.   Patient was scheduled to begin chemotherapy in our clinic from 08/22/2024.  However she got hospitalized on  08/21/2024 with intractable pain.  Plan to begin chemotherapy inpatient starting from 08/23/2024.  IR has been consulted for Port-A-Cath placement which will be placed either later today or tomorrow.  We did get approval to proceed with inpatient chemo starting tomorrow.  Cancer associated pain Continue aggressive pain management.  Currently receiving Dilaudid  as needed.  We hope that the pain would improve once her disease is controlled with chemotherapy.  Will continue to follow.  Please call us  with any questions.    Chinita Patten, MD 08/22/2024 4:30 PM  ONCOLOGY HISTORY:   Patient was hospitalized when she presented to the ED on 04/19/2024 with complaints of abdominal pain.   CT abdomen and pelvis on 04/19/2024 showed intra and Perry pancreatic edema involving the pancreatic tail with thrombosis of the splenic vein around the tail of the pancreas.   Imaging features were compatible with acute pancreatitis.  14 mm ill-defined hypoattenuating focus in the tail of the pancreas  may reflect focal edema.  MRI abdomen was recommended for further evaluation.   On 04/19/2024, MRI of the abdomen showed edematous appearance of the pancreatic tail with extensive peripancreatic and retroperitoneal fat stranding, consistent with acute pancreatitis.  In the central pancreatic tail there was a focal, hypoenhancing lesion measuring 1.1 x 0.8 cm.  Pancreatic duct was focally effaced in this vicinity.  No upstream pancreatic ductal dilatation.  Numerous tiny diffusion restricting foci scattered throughout the liver, some of the largest of which are with faint associated hyperenhancement and early arterial phase.  Constellation of the findings were suspicious for small pancreatic adenocarcinoma underlying acute pancreatitis with liver metastasis.  Close follow-up was recommended.   Patient was evaluated by GI during this hospitalization.  Once her pain was adequately controlled, she was discharged on 04/27/2024 with outpatient GI follow-up.   Upper EUS on 07/04/2024, performed by Dr. Burnette.  It showed multiple 15 to 20 mm hypoechoic, round well-defined lesions in the left lobe of liver, FNA performed.  There was no significant pathology in the pancreatic head, genu of the pancreas and uncinate process of the pancreas.  Irregular hypoechoic mass was identified at the junction of pancreatic body/tail, measuring 31 mm x 22 mm.  Stage-T3, N0 by endosonographic criteria.  There was evidence of pancreatic ductal dilation.   Biopsies from pancreatic tail lesion showed malignant cells.  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 Immunohistochemical stains were performed to characterize the tumor cells. The cells are positive for CK7, PAX8, TTF-1, synaptophysin and chromogranin A. The cells are negative for CK20,  thyroglobulin, CDX2, GATA3, CK5/6, p40, D2-40, calretinin, WT1, HepPar 1, and Glypican 3. P53 is mutant type of staining. Ki67 proliferation index is approximately 50%.  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 The differential diagnosis includes metastatic high-grade neuroendocrine carcinoma from elsewhere in the body including pancreas (favored), gastrointestinal tract, lung, gynecological system, pancreatic acinar cell carcinoma or other types of carcinomas with neuroendocrine differentiation (solid pseudopapillary tumor with malignant transformation). Additional workups will be performed at [facility] including INSM-1 to confirm neuroendocrine lineage, BCL10 and Chymotrypsin to exclude acinar cell carcinoma, LEF-1 and beta-catenin to exclude solid pseudopapillary tumor with malignant transformation (less likely). The results will be reported in an addendum.    Addendum # 1: Based on the morphologic and immunophenotypic features, a metastatic papillary thyroid  carcinoma cannot be excluded. Correlation with imaging findings is recommended.    Addendum # 2: Additional immunohistochemical stains were performed in outside institution to further classify the tumor. Beta-catenin is negative for nuclear stain. LEF-1 and INSM1 show weak but relatively diffuse staining.  BCL10 and trypsin show weakly nuclear and cytoplasmic staining without typical granular staining. Chymotrypsin is negative. The immunoprofile is non-specific and non-conclusive. Final classification should be rendered in the excisional specimen.   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  Recommended palliative systemic chemotherapy with carboplatin and etoposide.  Patient has reservations against chemotherapy and is not considering chemotherapy at this time but she will discuss  further with rest of the family members and her pastor before making final decision.   On her consultation with us  on 07/26/2024, we submitted request for staging PET scan.  On 08/08/2024, PET scan showed multiple hepatic lesions with intense metabolic activity, progressed from July 2025, consistent with metastatic disease.  Hypermetabolic mass in the pancreatic body measuring approximately 4.3 x 2.8 cm, presumed primary  malignancy.  No hypermetabolic abdominal lymph nodes.  No lung, skeletal mets.   Patient is now agreeable to proceeding with systemic treatments.  Discussed treatment options after reviewing NCCN guidelines.  Plan made to proceed with cisplatin and etoposide.  Cisplatin will be given on day 1, etoposide on days 1-3 of 21-day cycle.  Plan to continue for 6-8 cycles with restaging imaging after every 3 cycles.  Patient was scheduled to begin chemotherapy in our clinic from 08/22/2024.  However she got hospitalized on 08/21/2024 with intractable pain.  Plan to begin chemotherapy inpatient starting from 08/23/2024.  SUBJECTIVE:   Patient seen and examined. She experiences pain located on the side and in the back. Medication has improved the pain, but it remains a significant concern for her and her family.  OBJECTIVE:  Vitals:   08/22/24 0525 08/22/24 1322  BP: 122/69 (!) 126/58  Pulse: 89 77  Resp: 18 16  Temp: 98.2 F (36.8 C) 98.2 F (36.8 C)  SpO2: 97% 98%     Intake/Output Summary (Last 24 hours) at 08/22/2024 1630 Last data filed at 08/22/2024 1502 Gross per 24 hour  Intake 660.35 ml  Output --  Net 660.35 ml    Physical Exam Constitutional:      General: She is not in acute distress.    Appearance: Normal appearance.  HENT:     Head: Normocephalic and atraumatic.  Cardiovascular:     Rate and Rhythm: Normal rate.  Pulmonary:     Effort: Pulmonary effort is normal. No respiratory distress.  Abdominal:     General: There is no distension.  Neurological:      General: No focal deficit present.     Mental Status: She is alert and oriented to person, place, and time.  Psychiatric:        Mood and Affect: Mood normal.        Behavior: Behavior normal.     LABS:   Results for orders placed or performed during the hospital encounter of 08/20/24 (from the past 24 hours)  CBC     Status: None   Collection Time: 08/22/24  6:08 AM  Result Value Ref Range   WBC 4.8 4.0 - 10.5 K/uL   RBC 4.53 3.87 - 5.11 MIL/uL   Hemoglobin 12.0 12.0 - 15.0 g/dL   HCT 59.9 63.9 - 53.9 %   MCV 88.3 80.0 - 100.0 fL   MCH 26.5 26.0 - 34.0 pg   MCHC 30.0 30.0 - 36.0 g/dL   RDW 86.9 88.4 - 84.4 %   Platelets 196 150 - 400 K/uL   nRBC 0.0 0.0 - 0.2 %  Comprehensive metabolic panel     Status: Abnormal   Collection Time: 08/22/24  6:08 AM  Result Value Ref Range   Sodium 140 135 - 145 mmol/L   Potassium 3.5 3.5 - 5.1 mmol/L   Chloride 101 98 - 111 mmol/L   CO2 30 22 - 32 mmol/L   Glucose, Bld 107 (H) 70 - 99 mg/dL   BUN 5 (L) 6 - 20 mg/dL   Creatinine, Ser 9.46 0.44 - 1.00 mg/dL   Calcium  9.4 8.9 - 10.3 mg/dL   Total Protein 6.3 (L) 6.5 - 8.1 g/dL   Albumin 3.4 (L) 3.5 - 5.0 g/dL   AST 38 15 - 41 U/L   ALT 43 0 - 44 U/L   Alkaline Phosphatase 336 (H) 38 - 126 U/L   Total Bilirubin 0.4 0.0 - 1.2 mg/dL   GFR,  Estimated >60 >60 mL/min   Anion gap 8 5 - 15  Lipase, blood     Status: Abnormal   Collection Time: 08/22/24  6:08 AM  Result Value Ref Range   Lipase 534 (H) 11 - 51 U/L     IMAGING STUDIES:   CT ABDOMEN PELVIS W CONTRAST Result Date: 08/21/2024 EXAM: CT ABDOMEN AND PELVIS WITH CONTRAST 08/21/2024 01:01:53 AM TECHNIQUE: CT of the abdomen and pelvis was performed with the administration of 100 mL of iohexol  (OMNIPAQUE ) 300 MG/ML solution. Multiplanar reformatted images are provided for review. Automated exposure control, iterative reconstruction, and/or weight-based adjustment of the mA/kV was utilized to reduce the radiation dose to as low as  reasonably achievable. COMPARISON: 04/19/2024, PET CT 08/08/24 CLINICAL HISTORY: Abdominal pain, acute, nonlocalized; RUQ, epigastric TTP, hx pancreatic cancer with mets to liver, starting chemo soon. FINDINGS: LOWER CHEST: No acute abnormality. LIVER: Numerous low density masses throughout the liver compatible with metastases. Largest mass centrally in the liver measures up to 4.1 cm. GALLBLADDER AND BILE DUCTS: Gallbladder is unremarkable. No biliary ductal dilatation. SPLEEN: No acute abnormality. PANCREAS: Low density ill defined mass in the pancreatic body measures 2.9 cm. This is compatible with the patient's known pancreatic cancer . ADRENAL GLANDS: No acute abnormality. KIDNEYS, URETERS AND BLADDER: No stones in the kidneys or ureters. No hydronephrosis. No perinephric or periureteral stranding. Urinary bladder is unremarkable. GI AND BOWEL: Stomach demonstrates no acute abnormality. There is no bowel obstruction. PERITONEUM AND RETROPERITONEUM: No ascites. No free air. VASCULATURE: Aorta is normal in caliber. LYMPH NODES: No lymphadenopathy. REPRODUCTIVE ORGANS: No acute abnormality. BONES AND SOFT TISSUES: No acute osseous abnormality. No focal soft tissue abnormality. IMPRESSION: 1. Numerous hepatic metastases, largest 4.1 cm; progressive metastatic disease. 2. Pancreatic body mass 2.9 cm corresponding to known pancreatic cancer . Electronically signed by: Franky Crease MD 08/21/2024 01:29 AM EST RP Workstation: [id number]   NM PET Image Initial (PI) Skull Base To Thigh Result Date: 08/11/2024 EXAM: PET AND CT SKULL BASE TO MID THIGH 08/08/2024 95:73:75 PM TECHNIQUE: RADIOPHARMACEUTICAL: 10.70 mCi F-18 FDG Uptake time 60 minutes. Glucose level 108 mg/dl. PET imaging was acquired from the base of the skull to the mid thighs. Non-contrast enhanced computed tomography was obtained for attenuation correction and anatomic localization. COMPARISON: MRI dated 04/19/2024. CLINICAL HISTORY: Poorly differentiated  carcinoma noted on liver biopsy, neuroendocrine features. Needs staging. Table formatting from the original note was not included. 10.70 mCi F-18 FDG @ 1514 IV in RT HAND//BWM-KC. FBG: 108 mg/dL. 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 Needs staging. FINDINGS: HEAD AND NECK: No metabolically active cervical lymphadenopathy. CHEST: No metabolically active pulmonary nodules. No lung metastasis. No metabolically active lymphadenopathy. ABDOMEN AND PELVIS: There are multiple intense metabolic activity within the liver, dominant lesion in the central left hepatic lobe with SUV max equal to 10.1. This lesion is subtly evident on noncontrast CT measuring 18 mm on image 91. Additional lesion in the posterior left hepatic lobe on image 103. Additional lesion lateral segment of the left hepatic lobe on imaging 108. The lesions appear progressed from MRI April 19, 2024. Within the central body of the pancreas, there is an intense focus of metabolic activity with SUV max equal to 9.3 image 108. This corresponds to focal enlargement of the mid body of the pancreas to 4.3 x 2.8 cm on image 109. There is interval resolution of the distal pancreatitis seen on comparison CT and MRI. No hypermetabolic abdominal lymph nodes  are identified. The uterus and ovaries are normal. Physiologic activity within the gastrointestinal and genitourinary systems. BONES AND SOFT TISSUE: No abnormal FDG activity localizes to the bones. No metabolically active aggressive osseous lesion. IMPRESSION: 1. Hepatic lesions with intense metabolic activity, progressed from April 19, 2024 MRI, consistent with metastatic disease. 2. Hypermetabolic mass in the pancreatic body measuring approximately 4.3 x 2.8 cm. Presumed primary malignancy . 3. No hypermetabolic abdominal lymph nodes. 4. No skeletal metastasis. 5. No lung metastasis. 6. Interval resolution of distal pancreatitis. Electronically  signed by: Norleen Boxer MD 08/11/2024 09:30 AM EDT RP Workstation: [id number]

## 2024-08-22 NOTE — Consult Note (Signed)
 Chief Complaint: Metastatic pancreatic neuroendocrine cancer; referred for Port-A-Cath placement to assist with treatment.  Referring Provider(s): Pasam,A  Supervising Physician: Philip Cornet  Patient Status: [facility] - In-pt  History of Present Illness: Jo Allen is a 58 y.o. female with past medical history significant for hypertension, hyperlipidemia, coronary artery disease, GERD, mood disorder, asthma and metastatic pancreatic neuroendocrine cancer who was recently admitted to [facility] on 11/3 with generalized abdominal pain associated with nausea and poor oral intake/pancreatitis.  Oncology plans inpatient chemotherapy and request now received for Port-A-Cath placement to assist with treatment.   Patient is Full Code  Past Medical History:  Diagnosis Date   Acute pancreatitis 04/19/2024   Asthma    CAD (coronary artery disease)    GERD (gastroesophageal reflux disease)    Headache    Heart disease    Hypertension    Obesity    Paresthesia 08/20/2015   Prediabetes    Transfusion history    49yrs ago    Past Surgical History:  Procedure Laterality Date   ANGIOPLASTY     BREAST BIOPSY Left    BREAST BIOPSY WITH SENTINEL LYMPH NODE BIOPSY AND NEEDLE LOCALIZATION Left    COLONOSCOPY WITH PROPOFOL  N/A 02/19/2014   Procedure: COLONOSCOPY WITH PROPOFOL ;  Surgeon: Gladis MARLA Louder, MD;  Location: [facility];  Service: Endoscopy;  Laterality: N/A;   ESOPHAGOGASTRODUODENOSCOPY N/A 07/04/2024   Procedure: EGD (ESOPHAGOGASTRODUODENOSCOPY);  Surgeon: Burnette Fallow, MD;  Location: [facility];  Service: Gastroenterology;  Laterality: N/A;   EUS N/A 07/04/2024   Procedure: ULTRASOUND, UPPER GI TRACT, ENDOSCOPIC;  Surgeon: Burnette Fallow, MD;  Location: [facility];  Service: Gastroenterology;  Laterality: N/A;   FINE NEEDLE ASPIRATION  07/04/2024   Procedure: FINE NEEDLE ASPIRATION;  Surgeon: Burnette Fallow, MD;  Location: [facility];  Service: Gastroenterology;;   JOINT  REPLACEMENT Bilateral    LEFT HEART CATH AND CORONARY ANGIOGRAPHY N/A 04/29/2023   Procedure: LEFT HEART CATH AND CORONARY ANGIOGRAPHY;  Surgeon: Ladona Heinz, MD;  Location: [facility];  Service: Cardiovascular;  Laterality: N/A;   TOTAL KNEE ARTHROPLASTY Bilateral     Allergies: Morphine  and codeine, Trazodone, and Penicillins  Medications: Prior to Admission medications   Medication Sig Start Date End Date Taking? Authorizing Provider  amLODipine  (NORVASC ) 10 MG tablet Take 1 tablet (10 mg total) by mouth daily. 04/29/23  Yes Ladona Heinz, MD  lamoTRIgine  (LAMICTAL ) 200 MG tablet Take 200 mg by mouth 2 (two) times daily.   Yes [provider]  lidocaine -prilocaine (EMLA) cream Apply to affected area once 08/15/24  Yes Pasam, Avinash, MD  lipase/protease/amylase (CREON ) 36000 UNITS CPEP capsule Take 1 capsule (36,000 Units total) by mouth 3 (three) times daily with meals. 04/27/24  Yes Danford, Lonni SQUIBB, MD  meloxicam (MOBIC) 15 MG tablet Take 15 mg by mouth daily.   Yes [provider]  metoprolol  succinate (TOPROL  XL) 50 MG 24 hr tablet Take 1 tablet (50 mg total) by mouth daily. 04/29/23 08/21/24 Yes Ladona Heinz, MD  ondansetron  (ZOFRAN ) 8 MG tablet Take 1 tablet (8 mg total) by mouth every 8 (eight) hours as needed for nausea or vomiting. Start on the third day after cisplatin. 08/15/24  Yes Pasam, Avinash, MD  QUEtiapine  (SEROQUEL ) 100 MG tablet Take 100 mg by mouth at bedtime. 04/04/24  Yes [provider]  rosuvastatin  (CRESTOR ) 20 MG tablet Take 20 mg by mouth daily. 08/02/24  Yes [provider]  tiZANidine  (ZANAFLEX ) 2 MG tablet Take 1-2 tablets (2-4 mg  total) by mouth every 6 (six) hours as needed for muscle spasms. 02/01/21  Yes Wieters, Hallie C, PA-C  vitamin B-12 (CYANOCOBALAMIN ) 1000 MCG tablet Take 1,000 mcg by mouth daily.   Yes [provider]  zaleplon (SONATA) 5 MG capsule Take 5 mg by mouth at bedtime. 04/04/24  Yes [provider]  albuterol  (VENTOLIN  HFA) 108 (90 Base) MCG/ACT inhaler Inhale 1-2 puffs into the lungs every 6 (six) hours as needed for wheezing or shortness of breath. 01/03/22   Billy Asberry FALCON, PA-C  Biotin  1000 MCG CHEW Chew 2,000 mcg by mouth daily.    [provider]  buPROPion  (WELLBUTRIN  XL) 150 MG 24 hr tablet Take 150 mg by mouth daily. 01/24/24   [provider]  Cholecalciferol (VITAMIN D3) 50 MCG (2000 UT) capsule Take 4,000 Units by mouth daily.    [provider]  CRANBERRY PO Take 1 tablet by mouth daily.    [provider]  cyclobenzaprine  (FLEXERIL ) 10 MG tablet Take 1 tablet (10 mg total) by mouth 2 (two) times daily as needed for muscle spasms. Patient taking differently: Take 10 mg by mouth daily. 02/03/24   Kingsley, Victoria K, DO  dexamethasone  (DECADRON ) 4 MG tablet Take for 1 day starting the day after chemotherapy on day 4. Take with food. 08/15/24   Pasam, Chinita, MD  ferrous sulfate  325 (65 FE) MG EC tablet Take 325 mg by mouth daily.    [provider]  gabapentin  (NEURONTIN ) 100 MG capsule Take 100 mg by mouth every 8 (eight) hours. 12/13/23   [provider]  levocetirizine (XYZAL) 5 MG tablet Take 5 mg by mouth daily.    [provider]  Magnesium Gluconate (MAGNESIUM 27 PO) Take 1 tablet by mouth daily.    [provider]  nitroGLYCERIN  (NITROSTAT ) 0.4 MG SL tablet Place 1 tablet (0.4 mg total) under the tongue every 5 (five) minutes as needed for chest pain. If you require more than two tablets five minutes apart go to the nearest ER via EMS. Patient not taking: Reported on 07/26/2024 04/14/23 05/14/23  Michele Richardson, DO  ondansetron  (ZOFRAN ) 4 MG tablet Take 1 tablet (4 mg total) by mouth every 6 (six) hours as needed for nausea. Patient not taking: Reported on 08/21/2024 04/27/24   Jonel Lonni SQUIBB, MD  prochlorperazine (COMPAZINE) 10 MG tablet Take 1 tablet (10 mg total) by mouth every 6 (six)  hours as needed for nausea or vomiting. Patient not taking: Reported on 08/21/2024 08/15/24   Pasam, Chinita, MD  traMADol  (ULTRAM ) 50 MG tablet Take 1 tablet (50 mg total) by mouth every 6 (six) hours as needed. 08/15/24   Pasam, Chinita, MD     Family History  Problem Relation Age of Onset   Hypertension Mother    Diabetes Mother    Stroke Father    Headache Sister    Depression Brother    Breast cancer Neg Hx     Social History   Socioeconomic History   Marital status: Single    Spouse name: Not on file   Number of children: Not on file   Years of education: Not on file   Highest education level: Not on file  Occupational History   Not on file  Tobacco Use   Smoking status: Never   Smokeless tobacco: Never  Vaping Use   Vaping status: Never Used  Substance and Sexual Activity   Alcohol use: No   Drug use: No   Sexual  activity: Not Currently  Other Topics Concern   Not on file  Social History Narrative   Not on file   Social Drivers of Health   Financial Resource Strain: Low Risk  (12/19/2023)   Received from [facility]   Overall Financial Resource Strain (CARDIA)    Difficulty of Paying Living Expenses: Not hard at all  Food Insecurity: No Food Insecurity (08/21/2024)   Hunger Vital Sign    Worried About Running Out of Food in the Last Year: Never true    Ran Out of Food in the Last Year: Never true  Transportation Needs: No Transportation Needs (08/21/2024)   PRAPARE - Administrator, Civil Service (Medical): No    Lack of Transportation (Non-Medical): No  Physical Activity: Unknown (11/02/2022)   Received from [facility]   Exercise Vital Sign    On average, how many days per week do you engage in moderate to strenuous exercise (like a brisk walk)?: Patient declined    On average, how many minutes do you engage in exercise at this level?: 0 min  Stress: Stress Concern Present (11/02/2022)   Received from [facility] - Occupational Stress Questionnaire    Feeling of Stress : To some extent  Social Connections: Socially Integrated (11/02/2022)   Received from [facility]   Social Network    How would you rate your social network (family, work, friends)?: Good participation with social networks       Review of Systems currently denies fever, headache, chest pain, worsening dyspnea, cough, back pain, nausea, vomiting or bleeding.  She does have some mild left-sided abdominal discomfort  Vital Signs: BP (!) 126/58 (BP Location: Left Arm)   Pulse 77   Temp 98.2 F (36.8 C) (Oral)   Resp 16   Ht 5' 4 (1.626 m)   Wt 208 lb 8.9 oz (94.6 kg)   SpO2 98%   BMI 35.80 kg/m   Advance Care Plan: No documents on file  Physical Exam: Awake, alert.  Chest clear to auscultation bilaterally.  Heart with regular rate and rhythm.  Abdomen soft, positive bowel sounds, some mild left upper quadrant tenderness to palpation.  No lower extremity edema.   Imaging: CT ABDOMEN PELVIS W CONTRAST Result Date: 08/21/2024 EXAM: CT ABDOMEN AND PELVIS WITH CONTRAST 08/21/2024 01:01:53 AM TECHNIQUE: CT of the abdomen and pelvis was performed with the administration of 100 mL of iohexol  (OMNIPAQUE ) 300 MG/ML solution. Multiplanar reformatted images are provided for review. Automated exposure control, iterative reconstruction, and/or weight-based adjustment of the mA/kV was utilized to reduce the radiation dose to as low as reasonably achievable. COMPARISON: 04/19/2024, PET CT 08/08/24 CLINICAL HISTORY: Abdominal pain, acute, nonlocalized; RUQ, epigastric TTP, hx pancreatic cancer with mets to liver, starting chemo soon. FINDINGS: LOWER CHEST: No acute abnormality. LIVER: Numerous low density masses throughout the liver compatible with metastases. Largest mass centrally in the liver measures up to 4.1 cm. GALLBLADDER AND BILE DUCTS: Gallbladder is unremarkable. No biliary ductal dilatation. SPLEEN: No acute  abnormality. PANCREAS: Low density ill defined mass in the pancreatic body measures 2.9 cm. This is compatible with the patient's known pancreatic cancer . ADRENAL GLANDS: No acute abnormality. KIDNEYS, URETERS AND BLADDER: No stones in the kidneys or ureters. No hydronephrosis. No perinephric or periureteral stranding. Urinary bladder is unremarkable. GI AND BOWEL: Stomach demonstrates no acute abnormality. There is no bowel obstruction. PERITONEUM AND RETROPERITONEUM: No ascites. No free air. VASCULATURE: Aorta is  normal in caliber. LYMPH NODES: No lymphadenopathy. REPRODUCTIVE ORGANS: No acute abnormality. BONES AND SOFT TISSUES: No acute osseous abnormality. No focal soft tissue abnormality. IMPRESSION: 1. Numerous hepatic metastases, largest 4.1 cm; progressive metastatic disease. 2. Pancreatic body mass 2.9 cm corresponding to known pancreatic cancer . Electronically signed by: Franky Crease MD 08/21/2024 01:29 AM EST RP Workstation: [id number]   NM PET Image Initial (PI) Skull Base To Thigh Result Date: 08/11/2024 EXAM: PET AND CT SKULL BASE TO MID THIGH 08/08/2024 95:73:75 PM TECHNIQUE: RADIOPHARMACEUTICAL: 10.70 mCi F-18 FDG Uptake time 60 minutes. Glucose level 108 mg/dl. PET imaging was acquired from the base of the skull to the mid thighs. Non-contrast enhanced computed tomography was obtained for attenuation correction and anatomic localization. COMPARISON: MRI dated 04/19/2024. CLINICAL HISTORY: Poorly differentiated carcinoma noted on liver biopsy, neuroendocrine features. Needs staging. Table formatting from the original note was not included. 10.70 mCi F-18 FDG @ 1514 IV in RT HAND//BWM-KC. FBG: 108 mg/dL. 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 Needs staging. FINDINGS: HEAD AND NECK: No metabolically active cervical lymphadenopathy. CHEST: No metabolically active pulmonary nodules. No lung metastasis. No metabolically active  lymphadenopathy. ABDOMEN AND PELVIS: There are multiple intense metabolic activity within the liver, dominant lesion in the central left hepatic lobe with SUV max equal to 10.1. This lesion is subtly evident on noncontrast CT measuring 18 mm on image 91. Additional lesion in the posterior left hepatic lobe on image 103. Additional lesion lateral segment of the left hepatic lobe on imaging 108. The lesions appear progressed from MRI April 19, 2024. Within the central body of the pancreas, there is an intense focus of metabolic activity with SUV max equal to 9.3 image 108. This corresponds to focal enlargement of the mid body of the pancreas to 4.3 x 2.8 cm on image 109. There is interval resolution of the distal pancreatitis seen on comparison CT and MRI. No hypermetabolic abdominal lymph nodes are identified. The uterus and ovaries are normal. Physiologic activity within the gastrointestinal and genitourinary systems. BONES AND SOFT TISSUE: No abnormal FDG activity localizes to the bones. No metabolically active aggressive osseous lesion. IMPRESSION: 1. Hepatic lesions with intense metabolic activity, progressed from April 19, 2024 MRI, consistent with metastatic disease. 2. Hypermetabolic mass in the pancreatic body measuring approximately 4.3 x 2.8 cm. Presumed primary malignancy . 3. No hypermetabolic abdominal lymph nodes. 4. No skeletal metastasis. 5. No lung metastasis. 6. Interval resolution of distal pancreatitis. Electronically signed by: Norleen Boxer MD 08/11/2024 09:30 AM EDT RP Workstation: [id number]    Labs:  CBC: Recent Labs    07/26/24 1227 08/15/24 0758 08/20/24 1921 08/22/24 0608  WBC 4.2 5.4 7.2 4.8  HGB 12.7 12.5 13.1 12.0  HCT 40.4 39.2 43.1 40.0  PLT 242 264 266 196    COAGS: No results for input(s): INR, APTT in the last 8760 hours.  BMP: Recent Labs    07/26/24 1227 08/15/24 0758 08/20/24 1921 08/22/24 0608  NA 142 140 140 140  K 3.6 3.8 3.7 3.5  CL 105 103 100  101  CO2 32 30 29 30   GLUCOSE 107* 117* 160* 107*  BUN 10 <5* 6 5*  CALCIUM  10.1 9.7 9.9 9.4  CREATININE 0.66 0.71 0.50 0.53  GFRNONAA >60 >60 >60 >60    LIVER FUNCTION TESTS: Recent Labs    07/26/24 1227 08/15/24 0758 08/20/24 1921 08/22/24 0608  BILITOT 0.3 0.4 0.3 0.4  AST 23 27 50* 38  ALT  19 30 60* 43  ALKPHOS 197* 321* 434* 336*  PROT 7.6 7.3 8.0 6.3*  ALBUMIN 4.2 4.0 4.3 3.4*    TUMOR MARKERS: Recent Labs    07/26/24 1227  CEA 5.47*    Assessment and Plan: 58 y.o. female with past medical history significant for hypertension, hyperlipidemia, coronary artery disease, GERD, mood disorder, asthma and metastatic pancreatic neuroendocrine cancer who was recently admitted to [facility] on 11/3 with generalized abdominal pain associated with nausea and poor oral intake/pancreatitis.  Oncology plans inpatient chemotherapy and request now received for Port-A-Cath placement to assist with treatment.Risks and benefits of image guided port-a-catheter placement was discussed with the patient/family including, but not limited to bleeding, infection, pneumothorax, or fibrin sheath development and need for additional procedures.  All of the patient's questions were answered, patient is agreeable to proceed. Consent signed and in chart.  Patient afebrile, WBC normal.  Procedure planned for either later today or tomorrow.   Thank you for allowing our service to participate in Jo Allen 's care.  Electronically Signed: D. Franky Rakers, PA-C   08/22/2024, 1:49 PM      I spent a total of  25 minutes   in face to face in clinical consultation, greater than 50% of which was counseling/coordinating care for port a cath placement

## 2024-08-22 NOTE — Evaluation (Signed)
 Occupational Therapy Evaluation Patient Details Name: Jo Allen MRN: [id number] DOB: 06/30/1966 Today's Date: 08/22/2024   History of Present Illness   Alla Colford is a 58 y.o. female with medical history significant of pancreatic cancer with metastasis, CAD, GERD, asthma, hypertension, hyperlipidemia, mood disorder presenting with pancreatitis.  Patient reports approximately 2 to 3 days of generalized abdominal pain nausea as well as decreased p.o. intake.  Noted to have been admitted for similar issues July of this year with noted finding of pancreatic mass.  Was formally diagnosed with pancreatic cancer within the past 1 to 2 months.  Followed by Dr. Autumn outpatient on active chemotherapy for treatment.  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     Clinical Impressions Pt was seen for OT evaluation this date. Prior to hospital admission, pt was indep working as a press photographer. Pt lives with her son and his girlfriend in apartment with one flight to reach one level apartment. Pt reports able to have 24/7 supervision as needed one DC home. Pt presents with deficits in mild decreased Ind in self care, balance, functional mobility/transfers, activity tolerance, and safety awareness affecting safe and optimal ADL completion. Pt currently requires CGA dynamic standing during sink level ADL tasks, progressing to supervision with use of IV pole during ambulation into BR. Pt completed pericare, via lateral leans with supervision. STS from low toilet with use of grab rails with CGA for safety. Pt would benefit from skilled OT services to address noted impairments and functional limitations (see below for any additional details) in order to maximize safety and independence while minimizing future risk of falls, injury, and readmission. Do not anticipate the need for follow up OT services upon acute hospital DC. OT will follow acutely.    If plan is discharge home,  recommend the following:   Help with stairs or ramp for entrance;Assistance with cooking/housework;A little help with bathing/dressing/bathroom     Functional Status Assessment   Patient has had a recent decline in their functional status and demonstrates the ability to make significant improvements in function in a reasonable and predictable amount of time.     Equipment Recommendations   None recommended by OT      Precautions/Restrictions   Precautions Precautions: None Recall of Precautions/Restrictions: Intact Restrictions Weight Bearing Restrictions Per Provider Order: No     Mobility Bed Mobility Overal bed mobility: Needs Assistance Bed Mobility: Supine to Sit     Supine to sit: Min assist, HOB elevated     General bed mobility comments: Single HHA to rise from bed    Transfers Overall transfer level: Needs assistance Equipment used:  (IV pole) Transfers: Sit to/from Stand Sit to Stand: Contact guard assist           General transfer comment: CGA STS from lowest bed height, utilized IV pole during ambulation to and from BR      Balance Overall balance assessment: Mild deficits observed, not formally tested                                         ADL either performed or assessed with clinical judgement   ADL Overall ADL's : Needs assistance/impaired Eating/Feeding: NPO   Grooming: Wash/dry face;Wash/dry hands;Standing;Contact guard assist   Upper Body Bathing: Contact guard assist;Standing   Lower Body Bathing: Contact guard assist;Sit to/from stand   Upper Body  Dressing : Supervision/safety;Sitting   Lower Body Dressing: Contact guard assist;Sit to/from stand   Toilet Transfer: Contact guard assist;Ambulation;Regular Toilet;Grab bars   Toileting- Clothing Manipulation and Hygiene: Supervision/safety;Sitting/lateral lean       Functional mobility during ADLs: Supervision/safety;Contact guard assist General ADL  Comments: CGA initially progressing to supervision for ADL tasks     Vision Baseline Vision/History: 1 Wears glasses                         Pertinent Vitals/Pain Pain Assessment Pain Assessment: No/denies pain (pain meds prior to arrival to room)     Extremity/Trunk Assessment Upper Extremity Assessment Upper Extremity Assessment: Overall WFL for tasks assessed   Lower Extremity Assessment Lower Extremity Assessment: Defer to PT evaluation;Generalized weakness   Cervical / Trunk Assessment Cervical / Trunk Assessment: Normal   Communication Communication Communication: No apparent difficulties   Cognition Arousal: Alert Behavior During Therapy: WFL for tasks assessed/performed Cognition: No apparent impairments             OT - Cognition Comments: A/Ox4                 Following commands: Intact       Cueing  General Comments   Cueing Techniques: Verbal cues  Pt son GF who lives with pt reports she is not at her baseline reguarding ambulation.   Exercises Exercises: Other exercises Other Exercises Other Exercises: Edu Pt/family: Role of OT eval, safe ADL completion, fall prevention, discharge planning         Home Living Family/patient expects to be discharged to:: Private residence Living Arrangements: Children   Type of Home: Apartment Home Access: Stairs to enter Secretary/administrator of Steps: 12 Entrance Stairs-Rails: Right Home Layout: One level     Bathroom Shower/Tub: Chief Strategy Officer: Standard     Home Equipment: None          Prior Functioning/Environment Prior Level of Function : Working/employed;Independent/Modified Independent             Mobility Comments: No DME use at baseline ADLs Comments: indep, HH caregiver PTA    OT Problem List: Decreased strength;Decreased activity tolerance   OT Treatment/Interventions: Self-care/ADL training;Energy conservation;DME and/or AE  instruction;Therapeutic activities;Patient/family education;Balance training      OT Goals(Current goals can be found in the care plan section)   Acute Rehab OT Goals Patient Stated Goal: Get better OT Goal Formulation: With patient Time For Goal Achievement: 09/05/24 Potential to Achieve Goals: Good ADL Goals Pt Will Perform Grooming: Independently;standing Pt Will Perform Lower Body Dressing: Independently;sitting/lateral leans Pt Will Transfer to Toilet: Independently;ambulating Pt Will Perform Toileting - Clothing Manipulation and hygiene: Independently;sitting/lateral leans   OT Frequency:  Min 1X/week    AM-PAC OT 6 Clicks Daily Activity     Outcome Measure Help from another person eating meals?: None Help from another person taking care of personal grooming?: None Help from another person toileting, which includes using toliet, bedpan, or urinal?: None Help from another person bathing (including washing, rinsing, drying)?: A Little Help from another person to put on and taking off regular upper body clothing?: None Help from another person to put on and taking off regular lower body clothing?: A Little 6 Click Score: 22   End of Session Equipment Utilized During Treatment: Other (comment) (IV pole) Nurse Communication: Mobility status  Activity Tolerance: Patient tolerated treatment well Patient left: in chair;with call bell/phone within reach;with chair alarm set;with  family/visitor present  OT Visit Diagnosis: Unsteadiness on feet (R26.81);Other abnormalities of gait and mobility (R26.89);Muscle weakness (generalized) (M62.81)                Time: 1000-1015 OT Time Calculation (min): 15 min Charges:  OT General Charges $OT Visit: 1 Visit OT Evaluation $OT Eval Moderate Complexity: 1 Mod  Faheem Ziemann M.S. OTR/L  08/22/24, 10:49 AM

## 2024-08-22 NOTE — Progress Notes (Signed)
 PROGRESS NOTE    Jo Allen  FMW:[id number] DOB: 16-Sep-1966 DOA: 08/20/2024 PCP: [name], PA    Brief Narrative:   Jo Allen is a 58 y.o. female with past medical history significant for pancreatic neuroendocrine cancer with hepatic metastasis, HTN, HLD, CAD, GERD, mood disorder, asthma who presented to [facility] ED on 08/20/2024 with generalized abdominal pain associated nausea, poor oral intake.  Ongoing over the last 2-3 days.  Recently diagnosed with pancreatic cancer, yet to have started chemotherapy.  Denies tobacco or alcohol use.  In the ED, temperature 98.1 F, HR 94, RR 16, BP 169/102, SpO2 10 percent on room air.  WBC 7.2, hemoglobin 13.1, plate count 733.  Sodium 140, potassium 3.7, chloride 100, CO2 29, glucose 160, BUN 6, creatinine 0.50.  Alkaline phosphatase 434.  AST 50, ALT 60, total bilirubin 0.3.  Lipase 607.  High sensitive troponin less than 15.  COVID/influenza/RSV PCR negative.  Urinalysis unrevealing.  CT abdomen/pelvis with contrast with numerous hepatic metastasis, largest 4.1 cm with progressive metastatic disease, pancreatic body mass 2.9 cm corresponding to known pancreatic cancer.  TRH consulted for admission for further evaluation and management of acute pancreatitis, complicated by progressive pancreatic cancer with hepatic metastasis.  Assessment & Plan:   Acute pancreatitis Patient presenting to ED with 2-3-day history of nausea, abdominal pain.  LFTs notably elevated in the setting of progressive hepatic metastasis.  Lipase elevated 607. -- Lipase 607>534 -- LR at 100 mL/h -- Clear liquid diet -- Oxycodone  5 mg p.o. q4h PRN moderate pain -- Compazine 5 mg IV q6h PRN nausea/vomting -- Zofran  4mg  IV q6h PRN intractable nausea/vomiting -- Strict I's and O's, monitor emesis/bowel movements  Pancreatic neuroendocrine carcinoma with hepatic metastasis, progressive Recently diagnosed, follows with medical oncology, Dr. Autumn outpatient.  Has yet  to start chemotherapy.  Recent PET/CT 10/22 with multiple hepatic lesions.  CT abdomen/pelvis on admission with contrast with findings of numerous hepatic metastasis, largest 4.1 cm with progressive disease, pancreatic body mass 2.9 cm. -- Medical oncology following, appreciate assistance -- IR consulted for Port-A-Cath placement -- Oncology plans initiation of chemotherapy inpatient  HTN -- Amlodipine  10 mg p.o. daily -- Metoprolol  succinate 50 mg p.o. daily  HLD CAD -- Holding home statin in the setting of elevated LFTs  GERD -- Protonix  40 mg IV every 24 hours  Mood disorder -- Bupropion  150 mg p.o. daily -- Seroquel  100 mg p.o. nightly -- Lamictal  200 mg p.o. twice daily  Asthma -- Albuterol  neb every 6 hours as needed wheezing/shortness of breath  Obesity, class I Body mass index is 35.8 kg/m.    DVT prophylaxis: enoxaparin  (LOVENOX ) injection 40 mg Start: 08/21/24 1200    Code Status: Full Code Family Communication: Updated patient's son on speaker phone in room this morning  Disposition Plan:  Level of care: Med-Surg Status is: Inpatient Remains inpatient appropriate because: Pending Port-A-Cath placement and initiation of chemotherapy inpatient, needs further diet advancement with toleration    Consultants:  Medical oncology, Dr. Autumn Interventional radiology  Procedures:  None  Antimicrobials:  None   Subjective: Patient seen examined bedside, lying in bed.  Son currently on speaker phone.  Continues with nausea, 1 episode of vomiting yesterday and mild abdominal discomfort.  Reports will try more of the clear liquids today.  Mains on IV fluids.  Medical oncology plans inpatient chemotherapy, requesting Port-A-Cath placement; IR consulted.  Patient with no other specific questions, concerns or complaints at this time.  Denies headache, no dizziness, no  chest pain, no palpitations, no shortness of breath, no fever/chills/night sweats, no current  vomiting/diarrhea, no focal weakness, no fatigue, no paresthesias.  No acute events overnight per nurse staff.  Objective: Vitals:   08/21/24 1521 08/21/24 1934 08/21/24 2334 08/22/24 0525  BP: 127/65 123/62 107/61 122/69  Pulse: 87 86 91 89  Resp: 20 20 20 18   Temp: 98.5 F (36.9 C) 98.4 F (36.9 C) 98.3 F (36.8 C) 98.2 F (36.8 C)  TempSrc: Oral Oral Oral   SpO2: 97% 94% 91% 97%  Weight:      Height:        Intake/Output Summary (Last 24 hours) at 08/22/2024 1318 Last data filed at 08/21/2024 1503 Gross per 24 hour  Intake 129.86 ml  Output --  Net 129.86 ml   Filed Weights   08/21/24 1150  Weight: 94.6 kg    Examination:  Physical Exam: GEN: NAD, alert and oriented x 3,  obese HEENT: NCAT, PERRL, EOMI, sclera clear, MMM PULM: CTAB w/o wheezes/crackles, normal respiratory effort, room air CV: RRR w/o M/G/R GI: abd soft, NTND, + BS MSK: no peripheral edema, moves all extremities independently NEURO: No focal neurological deficit PSYCH: normal mood/affect Integumentary: No concerning rashes/lesions/wounds no exposed skin surfaces    Data Reviewed: I have personally reviewed following labs and imaging studies  CBC: Recent Labs  Lab 08/20/24 1921 08/22/24 0608  WBC 7.2 4.8  HGB 13.1 12.0  HCT 43.1 40.0  MCV 84.7 88.3  PLT 266 196   Basic Metabolic Panel: Recent Labs  Lab 08/20/24 1921 08/22/24 0608  NA 140 140  K 3.7 3.5  CL 100 101  CO2 29 30  GLUCOSE 160* 107*  BUN 6 5*  CREATININE 0.50 0.53  CALCIUM  9.9 9.4   GFR: Estimated Creatinine Clearance: 83.5 mL/min (by C-G formula based on SCr of 0.53 mg/dL). Liver Function Tests: Recent Labs  Lab 08/20/24 1921 08/22/24 0608  AST 50* 38  ALT 60* 43  ALKPHOS 434* 336*  BILITOT 0.3 0.4  PROT 8.0 6.3*  ALBUMIN 4.3 3.4*   Recent Labs  Lab 08/20/24 1921 08/22/24 0608  LIPASE 607* 534*   No results for input(s): AMMONIA in the last 168 hours. Coagulation Profile: No results for  input(s): INR, PROTIME in the last 168 hours. Cardiac Enzymes: No results for input(s): CKTOTAL, CKMB, CKMBINDEX, TROPONINI in the last 168 hours. BNP (last 3 results) No results for input(s): PROBNP in the last 8760 hours. HbA1C: No results for input(s): HGBA1C in the last 72 hours. CBG: No results for input(s): GLUCAP in the last 168 hours. Lipid Profile: No results for input(s): CHOL, HDL, LDLCALC, TRIG, CHOLHDL, LDLDIRECT in the last 72 hours. Thyroid  Function Tests: No results for input(s): TSH, T4TOTAL, FREET4, T3FREE, THYROIDAB in the last 72 hours. Anemia Panel: No results for input(s): VITAMINB12, FOLATE, FERRITIN, TIBC, IRON, RETICCTPCT in the last 72 hours. Sepsis Labs: No results for input(s): PROCALCITON, LATICACIDVEN in the last 168 hours.  Recent Results (from the past 240 hours)  Resp panel by RT-PCR (RSV, Flu A&B, Covid) Anterior Nasal Swab     Status: None   Collection Time: 08/20/24 11:12 PM   Specimen: Anterior Nasal Swab  Result Value Ref Range Status   SARS Coronavirus 2 by RT PCR NEGATIVE NEGATIVE Final    Comment: (NOTE) SARS-CoV-2 target nucleic acids are NOT DETECTED.  The SARS-CoV-2 RNA is generally detectable in upper respiratory specimens during the acute phase of infection. The lowest concentration of SARS-CoV-2 viral  copies this assay can detect is 138 copies/mL. A negative result does not preclude SARS-Cov-2 infection and should not be used as the sole basis for treatment or other patient management decisions. A negative result may occur with  improper specimen collection/handling, submission of specimen other than nasopharyngeal swab, presence of viral mutation(s) within the areas targeted by this assay, and inadequate number of viral copies(<138 copies/mL). A negative result must be combined with clinical observations, patient history, and epidemiological information. The expected result is  Negative.  Fact Sheet for Patients:  bloggercourse.com  Fact Sheet for Healthcare Providers:  seriousbroker.it  This test is no t yet approved or cleared by the [place]  FDA and  has been authorized for detection and/or diagnosis of SARS-CoV-2 by FDA under an Emergency Use Authorization (EUA). This EUA will remain  in effect (meaning this test can be used) for the duration of the COVID-19 declaration under Section 564(b)(1) of the Act, 21 U.S.C.section 360bbb-3(b)(1), unless the authorization is terminated  or revoked sooner.       Influenza A by PCR NEGATIVE NEGATIVE Final   Influenza B by PCR NEGATIVE NEGATIVE Final    Comment: (NOTE) The Xpert Xpress SARS-CoV-2/FLU/RSV plus assay is intended as an aid in the diagnosis of influenza from Nasopharyngeal swab specimens and should not be used as a sole basis for treatment. Nasal washings and aspirates are unacceptable for Xpert Xpress SARS-CoV-2/FLU/RSV testing.  Fact Sheet for Patients: bloggercourse.com  Fact Sheet for Healthcare Providers: seriousbroker.it  This test is not yet approved or cleared by the [place]  FDA and has been authorized for detection and/or diagnosis of SARS-CoV-2 by FDA under an Emergency Use Authorization (EUA). This EUA will remain in effect (meaning this test can be used) for the duration of the COVID-19 declaration under Section 564(b)(1) of the Act, 21 U.S.C. section 360bbb-3(b)(1), unless the authorization is terminated or revoked.     Resp Syncytial Virus by PCR NEGATIVE NEGATIVE Final    Comment: (NOTE) Fact Sheet for Patients: bloggercourse.com  Fact Sheet for Healthcare Providers: seriousbroker.it  This test is not yet approved or cleared by the [place]  FDA and has been authorized for detection and/or diagnosis of  SARS-CoV-2 by FDA under an Emergency Use Authorization (EUA). This EUA will remain in effect (meaning this test can be used) for the duration of the COVID-19 declaration under Section 564(b)(1) of the Act, 21 U.S.C. section 360bbb-3(b)(1), unless the authorization is terminated or revoked.  Performed at [facility], [address], [place], [place] [address]          Radiology Studies: CT ABDOMEN PELVIS W CONTRAST Result Date: 08/21/2024 EXAM: CT ABDOMEN AND PELVIS WITH CONTRAST 08/21/2024 01:01:53 AM TECHNIQUE: CT of the abdomen and pelvis was performed with the administration of 100 mL of iohexol  (OMNIPAQUE ) 300 MG/ML solution. Multiplanar reformatted images are provided for review. Automated exposure control, iterative reconstruction, and/or weight-based adjustment of the mA/kV was utilized to reduce the radiation dose to as low as reasonably achievable. COMPARISON: 04/19/2024, PET CT 08/08/24 CLINICAL HISTORY: Abdominal pain, acute, nonlocalized; RUQ, epigastric TTP, hx pancreatic cancer with mets to liver, starting chemo soon. FINDINGS: LOWER CHEST: No acute abnormality. LIVER: Numerous low density masses throughout the liver compatible with metastases. Largest mass centrally in the liver measures up to 4.1 cm. GALLBLADDER AND BILE DUCTS: Gallbladder is unremarkable. No biliary ductal dilatation. SPLEEN: No acute abnormality. PANCREAS: Low density ill defined mass in the pancreatic body measures 2.9 cm. This is compatible  with the patient's known pancreatic cancer . ADRENAL GLANDS: No acute abnormality. KIDNEYS, URETERS AND BLADDER: No stones in the kidneys or ureters. No hydronephrosis. No perinephric or periureteral stranding. Urinary bladder is unremarkable. GI AND BOWEL: Stomach demonstrates no acute abnormality. There is no bowel obstruction. PERITONEUM AND RETROPERITONEUM: No ascites. No free air. VASCULATURE: Aorta is normal in caliber. LYMPH NODES: No  lymphadenopathy. REPRODUCTIVE ORGANS: No acute abnormality. BONES AND SOFT TISSUES: No acute osseous abnormality. No focal soft tissue abnormality. IMPRESSION: 1. Numerous hepatic metastases, largest 4.1 cm; progressive metastatic disease. 2. Pancreatic body mass 2.9 cm corresponding to known pancreatic cancer . Electronically signed by: Franky Crease MD 08/21/2024 01:29 AM EST RP Workstation: [id number]        Scheduled Meds:  amLODipine   10 mg Oral Daily   buPROPion   150 mg Oral Daily   enoxaparin  (LOVENOX ) injection  40 mg Subcutaneous Q24H   lamoTRIgine   200 mg Oral BID   lipase/protease/amylase  36,000 Units Oral TID WC   metoprolol  succinate  50 mg Oral Daily   pantoprazole  (PROTONIX ) IV  40 mg Intravenous Q24H   QUEtiapine   100 mg Oral QHS   Continuous Infusions:  lactated ringers  100 mL/hr at 08/22/24 0817     LOS: 1 day    Time spent: 50 minutes spent on 08/22/2024 caring for this patient face-to-face including chart review, ordering labs/tests, documenting, discussion with nursing staff, consultants, updating family and interview/physical exam    Camellia PARAS Davarius Ridener, DO [facility] Available via Epic secure chat 7am-7pm After these hours, please refer to coverage provider listed on [email or web address] 08/22/2024, 1:18 PM

## 2024-08-23 ENCOUNTER — Inpatient Hospital Stay

## 2024-08-23 ENCOUNTER — Encounter: Payer: Self-pay | Admitting: Oncology

## 2024-08-23 DIAGNOSIS — C787 Secondary malignant neoplasm of liver and intrahepatic bile duct: Secondary | ICD-10-CM | POA: Diagnosis not present

## 2024-08-23 DIAGNOSIS — R7401 Elevation of levels of liver transaminase levels: Secondary | ICD-10-CM | POA: Diagnosis not present

## 2024-08-23 DIAGNOSIS — K859 Acute pancreatitis without necrosis or infection, unspecified: Secondary | ICD-10-CM | POA: Diagnosis not present

## 2024-08-23 DIAGNOSIS — G893 Neoplasm related pain (acute) (chronic): Secondary | ICD-10-CM | POA: Diagnosis not present

## 2024-08-23 DIAGNOSIS — C259 Malignant neoplasm of pancreas, unspecified: Secondary | ICD-10-CM | POA: Diagnosis not present

## 2024-08-23 DIAGNOSIS — C7A8 Other malignant neuroendocrine tumors: Secondary | ICD-10-CM | POA: Diagnosis not present

## 2024-08-23 LAB — MAGNESIUM: Magnesium: 1.9 mg/dL (ref 1.7–2.4)

## 2024-08-23 LAB — CBC WITH DIFFERENTIAL/PLATELET
Abs Immature Granulocytes: 0.01 K/uL (ref 0.00–0.07)
Basophils Absolute: 0 K/uL (ref 0.0–0.1)
Basophils Relative: 0 %
Eosinophils Absolute: 0.1 K/uL (ref 0.0–0.5)
Eosinophils Relative: 3 %
HCT: 40.2 % (ref 36.0–46.0)
Hemoglobin: 12.2 g/dL (ref 12.0–15.0)
Immature Granulocytes: 0 %
Lymphocytes Relative: 30 %
Lymphs Abs: 1.5 K/uL (ref 0.7–4.0)
MCH: 26.5 pg (ref 26.0–34.0)
MCHC: 30.3 g/dL (ref 30.0–36.0)
MCV: 87.4 fL (ref 80.0–100.0)
Monocytes Absolute: 0.6 K/uL (ref 0.1–1.0)
Monocytes Relative: 12 %
Neutro Abs: 2.6 K/uL (ref 1.7–7.7)
Neutrophils Relative %: 55 %
Platelets: 187 K/uL (ref 150–400)
RBC: 4.6 MIL/uL (ref 3.87–5.11)
RDW: 13.1 % (ref 11.5–15.5)
WBC: 4.9 K/uL (ref 4.0–10.5)
nRBC: 0 % (ref 0.0–0.2)

## 2024-08-23 LAB — COMPREHENSIVE METABOLIC PANEL WITH GFR
ALT: 38 U/L (ref 0–44)
AST: 38 U/L (ref 15–41)
Albumin: 3.5 g/dL (ref 3.5–5.0)
Alkaline Phosphatase: 335 U/L — ABNORMAL HIGH (ref 38–126)
Anion gap: 9 (ref 5–15)
BUN: <5 mg/dL — ABNORMAL LOW (ref 6–20)
CO2: 30 mmol/L (ref 22–32)
Calcium: 9.6 mg/dL (ref 8.9–10.3)
Chloride: 101 mmol/L (ref 98–111)
Creatinine, Ser: 0.61 mg/dL (ref 0.44–1.00)
GFR, Estimated: >60 mL/min (ref >60)
Glucose, Bld: 109 mg/dL — ABNORMAL HIGH (ref 70–99)
Potassium: 3.8 mmol/L (ref 3.5–5.1)
Sodium: 140 mmol/L (ref 135–145)
Total Bilirubin: 0.4 mg/dL (ref 0.0–1.2)
Total Protein: 6.5 g/dL (ref 6.5–8.1)

## 2024-08-23 LAB — LIPASE, BLOOD: Lipase: 484 U/L — ABNORMAL HIGH (ref 11–51)

## 2024-08-23 MED ORDER — SODIUM CHLORIDE 0.9 % IV SOLN
75.0000 mg/m2 | Freq: Once | INTRAVENOUS | Status: AC
  Administered 2024-08-23: 150 mg via INTRAVENOUS
  Filled 2024-08-23: qty 150

## 2024-08-23 MED ORDER — APREPITANT 130 MG/18ML IV EMUL: 130.0000 mg | Freq: Once | INTRAVENOUS | Status: DC

## 2024-08-23 MED ORDER — PALONOSETRON HCL INJECTION 0.25 MG/5ML
0.2500 mg | Freq: Once | INTRAVENOUS | Status: AC
  Administered 2024-08-23: 0.25 mg via INTRAVENOUS
  Filled 2024-08-23: qty 5

## 2024-08-23 MED ORDER — DEXAMETHASONE SOD PHOSPHATE PF 10 MG/ML IJ SOLN
10.0000 mg | Freq: Once | INTRAMUSCULAR | Status: AC
  Administered 2024-08-23: 10 mg via INTRAVENOUS

## 2024-08-23 MED ORDER — POTASSIUM CHLORIDE IN NACL 20-0.9 MEQ/L-% IV SOLN
Freq: Once | INTRAVENOUS | Status: AC
  Filled 2024-08-23: qty 1000

## 2024-08-23 MED ORDER — MAGNESIUM SULFATE 2 GM/50ML IV SOLN
2.0000 g | Freq: Once | INTRAVENOUS | Status: AC
  Administered 2024-08-23: 2 g via INTRAVENOUS
  Filled 2024-08-23: qty 50

## 2024-08-23 MED ORDER — SODIUM CHLORIDE 0.9 % IV SOLN
100.0000 mg/m2 | Freq: Once | INTRAVENOUS | Status: AC
  Administered 2024-08-23: 200 mg via INTRAVENOUS
  Filled 2024-08-23: qty 10

## 2024-08-23 MED ORDER — SODIUM CHLORIDE 0.9 % IV SOLN: INTRAVENOUS | Status: DC

## 2024-08-23 MED ORDER — SODIUM CHLORIDE 0.9 % IV SOLN
150.0000 mg | Freq: Once | INTRAVENOUS | Status: AC
  Administered 2024-08-23: 150 mg via INTRAVENOUS
  Filled 2024-08-23: qty 5

## 2024-08-23 MED ORDER — ONDANSETRON HCL 4 MG/2ML IJ SOLN: 4.0000 mg | Freq: Four times a day (QID) | INTRAMUSCULAR | Status: DC | PRN

## 2024-08-23 NOTE — Progress Notes (Signed)
 PROGRESS NOTE    Jo Allen  FMW:[id number] DOB: 07-01-1966 DOA: 08/20/2024 PCP: [name], PA    Brief Narrative:   Jo Allen is a 58 y.o. female with past medical history significant for pancreatic neuroendocrine cancer with hepatic metastasis, HTN, HLD, CAD, GERD, mood disorder, asthma who presented to [facility] ED on 08/20/2024 with generalized abdominal pain associated nausea, poor oral intake.  Ongoing over the last 2-3 days.  Recently diagnosed with pancreatic cancer, yet to have started chemotherapy.  Denies tobacco or alcohol use.  In the ED, temperature 98.1 F, HR 94, RR 16, BP 169/102, SpO2 10 percent on room air.  WBC 7.2, hemoglobin 13.1, plate count 733.  Sodium 140, potassium 3.7, chloride 100, CO2 29, glucose 160, BUN 6, creatinine 0.50.  Alkaline phosphatase 434.  AST 50, ALT 60, total bilirubin 0.3.  Lipase 607.  High sensitive troponin less than 15.  COVID/influenza/RSV PCR negative.  Urinalysis unrevealing.  CT abdomen/pelvis with contrast with numerous hepatic metastasis, largest 4.1 cm with progressive metastatic disease, pancreatic body mass 2.9 cm corresponding to known pancreatic cancer.  TRH consulted for admission for further evaluation and management of acute pancreatitis, complicated by progressive pancreatic cancer with hepatic metastasis.  Assessment & Plan:   Acute pancreatitis Patient presenting to ED with 2-3-day history of nausea, abdominal pain.  LFTs notably elevated in the setting of progressive hepatic metastasis.  Lipase elevated 607. -- Lipase 392>465>515 -- LR at 100 mL/h -- Currently n.p.o. pending Port-A-Cath placement -- Oxycodone  5 mg p.o. q4h PRN moderate pain -- Compazine 5 mg IV q6h PRN nausea/vomting -- Zofran  4mg  IV q6h PRN intractable nausea/vomiting -- Strict I's and O's, monitor emesis/bowel movements  Pancreatic neuroendocrine carcinoma with hepatic metastasis, progressive Recently diagnosed, follows with medical oncology,  Dr. Autumn outpatient.  Has yet to start chemotherapy.  Recent PET/CT 10/22 with multiple hepatic lesions.  CT abdomen/pelvis on admission with contrast with findings of numerous hepatic metastasis, largest 4.1 cm with progressive disease, pancreatic body mass 2.9 cm. -- Medical oncology following, appreciate assistance -- IR consulted for Port-A-Cath placement; hopefully today -- Oncology plans initiation of chemotherapy inpatient  HTN -- Amlodipine  10 mg p.o. daily -- Metoprolol  succinate 50 mg p.o. daily  HLD CAD -- Holding home statin in the setting of elevated LFTs  GERD -- Protonix  40 mg IV every 24 hours  Mood disorder -- Bupropion  150 mg p.o. daily -- Seroquel  100 mg p.o. nightly -- Lamictal  200 mg p.o. twice daily  Asthma -- Albuterol  neb every 6 hours as needed wheezing/shortness of breath  Obesity, class I Body mass index is 35.8 kg/m.    DVT prophylaxis: enoxaparin  (LOVENOX ) injection 40 mg Start: 08/24/24 1200    Code Status: Full Code Family Communication: No family present bedside this morning  Disposition Plan:  Level of care: Med-Surg Status is: Inpatient Remains inpatient appropriate because: Pending Port-A-Cath placement and initiation of chemotherapy inpatient, needs further diet advancement with toleration    Consultants:  Medical oncology, Dr. Autumn Interventional radiology  Procedures:  None  Antimicrobials:  None   Subjective: Patient seen examined bedside, lying in bed.  No family present.  No specific complaints this morning other than generalized abdominal pain.  Awaiting Port-A-Cath placement in order for oncology to initiate chemotherapy inpatient.  Patient with no other specific questions, concerns or complaints at this time.  Denies headache, no dizziness, no chest pain, no palpitations, no shortness of breath, no fever/chills/night sweats, no current vomiting/diarrhea, no focal weakness, no  fatigue, no paresthesias.  No acute events  overnight per nursing staff.  Objective: Vitals:   08/22/24 0525 08/22/24 1322 08/22/24 2229 08/23/24 0635  BP: 122/69 (!) 126/58 96/60 107/61  Pulse: 89 77 80 80  Resp: 18 16 18 18   Temp: 98.2 F (36.8 C) 98.2 F (36.8 C) 98.1 F (36.7 C) 98.4 F (36.9 C)  TempSrc:  Oral    SpO2: 97% 98% 97% 93%  Weight:      Height:        Intake/Output Summary (Last 24 hours) at 08/23/2024 1027 Last data filed at 08/23/2024 0636 Gross per 24 hour  Intake 2435.35 ml  Output --  Net 2435.35 ml   Filed Weights   08/21/24 1150  Weight: 94.6 kg    Examination:  Physical Exam: GEN: NAD, alert and oriented x 3, obese HEENT: NCAT, PERRL, EOMI, sclera clear, MMM PULM: CTAB w/o wheezes/crackles, normal respiratory effort, room air CV: RRR w/o M/G/R GI: abd soft, NTND, + BS MSK: no peripheral edema, moves all extremities independently NEURO: No focal neurological deficit PSYCH: normal mood/affect Integumentary: No concerning rashes/lesions/wounds no exposed skin surfaces    Data Reviewed: I have personally reviewed following labs and imaging studies  CBC: Recent Labs  Lab 08/20/24 1921 08/22/24 0608 08/23/24 0616  WBC 7.2 4.8 4.9  NEUTROABS  --   --  2.6  HGB 13.1 12.0 12.2  HCT 43.1 40.0 40.2  MCV 84.7 88.3 87.4  PLT 266 196 187   Basic Metabolic Panel: Recent Labs  Lab 08/20/24 1921 08/22/24 0608 08/23/24 0616  NA 140 140 140  K 3.7 3.5 3.8  CL 100 101 101  CO2 29 30 30   GLUCOSE 160* 107* 109*  BUN 6 5* <5*  CREATININE 0.50 0.53 0.61  CALCIUM  9.9 9.4 9.6  MG  --   --  1.9   GFR: Estimated Creatinine Clearance: 83.5 mL/min (by C-G formula based on SCr of 0.61 mg/dL). Liver Function Tests: Recent Labs  Lab 08/20/24 1921 08/22/24 0608 08/23/24 0616  AST 50* 38 38  ALT 60* 43 38  ALKPHOS 434* 336* 335*  BILITOT 0.3 0.4 0.4  PROT 8.0 6.3* 6.5  ALBUMIN 4.3 3.4* 3.5   Recent Labs  Lab 08/20/24 1921 08/22/24 0608 08/23/24 0616  LIPASE 607* 534* 484*    No results for input(s): AMMONIA in the last 168 hours. Coagulation Profile: No results for input(s): INR, PROTIME in the last 168 hours. Cardiac Enzymes: No results for input(s): CKTOTAL, CKMB, CKMBINDEX, TROPONINI in the last 168 hours. BNP (last 3 results) No results for input(s): PROBNP in the last 8760 hours. HbA1C: No results for input(s): HGBA1C in the last 72 hours. CBG: No results for input(s): GLUCAP in the last 168 hours. Lipid Profile: No results for input(s): CHOL, HDL, LDLCALC, TRIG, CHOLHDL, LDLDIRECT in the last 72 hours. Thyroid  Function Tests: No results for input(s): TSH, T4TOTAL, FREET4, T3FREE, THYROIDAB in the last 72 hours. Anemia Panel: No results for input(s): VITAMINB12, FOLATE, FERRITIN, TIBC, IRON, RETICCTPCT in the last 72 hours. Sepsis Labs: No results for input(s): PROCALCITON, LATICACIDVEN in the last 168 hours.  Recent Results (from the past 240 hours)  Resp panel by RT-PCR (RSV, Flu A&B, Covid) Anterior Nasal Swab     Status: None   Collection Time: 08/20/24 11:12 PM   Specimen: Anterior Nasal Swab  Result Value Ref Range Status   SARS Coronavirus 2 by RT PCR NEGATIVE NEGATIVE Final    Comment: (NOTE) SARS-CoV-2 target  nucleic acids are NOT DETECTED.  The SARS-CoV-2 RNA is generally detectable in upper respiratory specimens during the acute phase of infection. The lowest concentration of SARS-CoV-2 viral copies this assay can detect is 138 copies/mL. A negative result does not preclude SARS-Cov-2 infection and should not be used as the sole basis for treatment or other patient management decisions. A negative result may occur with  improper specimen collection/handling, submission of specimen other than nasopharyngeal swab, presence of viral mutation(s) within the areas targeted by this assay, and inadequate number of viral copies(<138 copies/mL). A negative result must be combined  with clinical observations, patient history, and epidemiological information. The expected result is Negative.  Fact Sheet for Patients:  bloggercourse.com  Fact Sheet for Healthcare Providers:  seriousbroker.it  This test is no t yet approved or cleared by the [place]  FDA and  has been authorized for detection and/or diagnosis of SARS-CoV-2 by FDA under an Emergency Use Authorization (EUA). This EUA will remain  in effect (meaning this test can be used) for the duration of the COVID-19 declaration under Section 564(b)(1) of the Act, 21 U.S.C.section 360bbb-3(b)(1), unless the authorization is terminated  or revoked sooner.       Influenza A by PCR NEGATIVE NEGATIVE Final   Influenza B by PCR NEGATIVE NEGATIVE Final    Comment: (NOTE) The Xpert Xpress SARS-CoV-2/FLU/RSV plus assay is intended as an aid in the diagnosis of influenza from Nasopharyngeal swab specimens and should not be used as a sole basis for treatment. Nasal washings and aspirates are unacceptable for Xpert Xpress SARS-CoV-2/FLU/RSV testing.  Fact Sheet for Patients: bloggercourse.com  Fact Sheet for Healthcare Providers: seriousbroker.it  This test is not yet approved or cleared by the [place]  FDA and has been authorized for detection and/or diagnosis of SARS-CoV-2 by FDA under an Emergency Use Authorization (EUA). This EUA will remain in effect (meaning this test can be used) for the duration of the COVID-19 declaration under Section 564(b)(1) of the Act, 21 U.S.C. section 360bbb-3(b)(1), unless the authorization is terminated or revoked.     Resp Syncytial Virus by PCR NEGATIVE NEGATIVE Final    Comment: (NOTE) Fact Sheet for Patients: bloggercourse.com  Fact Sheet for Healthcare Providers: seriousbroker.it  This test is not yet approved  or cleared by the [place]  FDA and has been authorized for detection and/or diagnosis of SARS-CoV-2 by FDA under an Emergency Use Authorization (EUA). This EUA will remain in effect (meaning this test can be used) for the duration of the COVID-19 declaration under Section 564(b)(1) of the Act, 21 U.S.C. section 360bbb-3(b)(1), unless the authorization is terminated or revoked.  Performed at [facility], [address], [place], [place] [address]          Radiology Studies: No results found.       Scheduled Meds:  amLODipine   10 mg Oral Daily   buPROPion   150 mg Oral Daily   CISplatin  75 mg/m2 (Treatment Plan Recorded) Intravenous Once   dexamethasone  (DECADRON ) IVPB ([facility])  10 mg Intravenous Once   [START ON 08/24/2024] enoxaparin  (LOVENOX ) injection  40 mg Subcutaneous Q24H   etoposide  100 mg/m2 (Treatment Plan Recorded) Intravenous Once   lamoTRIgine   200 mg Oral BID   lipase/protease/amylase  36,000 Units Oral TID WC   metoprolol  succinate  50 mg Oral Daily   palonosetron  0.25 mg Intravenous Once   pantoprazole  (PROTONIX ) IV  40 mg Intravenous Q24H   QUEtiapine   100 mg Oral QHS  Continuous Infusions:  sodium chloride  10 mL/hr at 08/23/24 9092   fosaprepitant (EMEND) 150 mg in sodium chloride  0.9 % 145 mL IVPB       LOS: 2 days    Time spent: 47 minutes spent on 08/23/2024 caring for this patient face-to-face including chart review, ordering labs/tests, documenting, discussion with nursing staff, consultants, updating family and interview/physical exam    Camellia PARAS Annalisa Colonna, DO [facility] Available via Epic secure chat 7am-7pm After these hours, please refer to coverage provider listed on [email or web address] 08/23/2024, 10:27 AM

## 2024-08-23 NOTE — Progress Notes (Signed)
 PT Cancellation Note  Patient Details Name: Jo Allen MRN: [id number] DOB: 07/17/1966   Cancelled Treatment:    Reason Eval/Treat Not Completed: Patient at procedure or test/unavailable Patient getting runs of potassium in AM, now sleeping soundly. Patient  is reportedly up ad lib  with mobility. PT will sign off at this time, please reorder if indicated.  [name] PT Acute Rehabilitation Services Office [phone]   [name] 08/23/2024, 4:33 PM

## 2024-08-23 NOTE — Plan of Care (Signed)

## 2024-08-23 NOTE — Progress Notes (Addendum)
 ADDENDUM:  Patient was personally and independently interviewed, examined and relevant elements of the history of present illness were reviewed in details and an assessment and plan was created. All elements of the patient's history of present illness, assessment and plan were discussed in detail with Jo JINNY Brunner, NP. The above documentation reflects our combined findings assessment and plan.   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  Got admitted with intractable abdominal pain.  Because of the nature of her malignancy, we decided to proceed with inpatient chemotherapy using cisplatin plus etoposide, cycle 1 day 1 today (08/23/2024).  She is scheduled for Port-A-Cath placement later today, as it could not be done yesterday.  Alkaline phosphatase mildly elevated.  She will receive cycle 1 day 2 and day 3 tomorrow and the day after.  Continue current pain regimen.  We expect pain to slowly improve once her disease status improves.  Plan for discharge to home following completion of chemotherapy, by Sunday or Monday, provided no other acute medical issues.   Jo Allen   DOB:March 27, 1966   FM#:[id number]      ASSESSMENT & PLAN:  Jo Allen is a 58 year old female with oncologic history significant for pancreatic cancer.  She was admitted at 08/21/2024 with complaints of worsening abdominal pain.  Medical oncology following.   Pancreatic neuroendocrine tumor with hepatic mets - Diagnosed 07/04/2024 with poorly differentiated carcinoma with neuroendocrine differentiation. - PET scan done 08/08/2024 which showed multiple hepatic lesions. - Chemotherapy was planned with cisplatin and etoposide for 6-8 cycles with restaging every 3 cycles.  Chemotherapy was due as outpatient 11/5 however due to hospital admission, we have planned to  start treatment inpatient.     -- Today is Cycle 1 Day 1 of chemotherapy regimen every 21 days: Cisplatin 150 mg + Etoposide 200 mg.   -- Plan for etoposide on days 2 and 3.   -- Seen by IR 11/5, plans for port placement likely today.  -- Continue to monitor closely.  - Medical oncology/Dr. Samarie Pinder following closely.   Abdominal pain Acute pancreatitis  - Stable - Continue pain medication regimen as ordered - Continue supportive care   Transaminitis - Resolved at this time. Elevated AST/ALT higher than baseline. Alk phos remains elevated.  Total bili WNL - Avoid hepatotoxic agents - Continue to monitor liver function   Hypertension Hyperlipidemia - On statin - Continue antihypertensives as ordered.    Code Status Full   Subjective:  Patient seen awake and alert laying in bed with IVF ongoing.  Reports that she is okay and is aware of start of chemotherapy planned for today.  States she has no questions.  Has slight abdominal pain controlled at this time. Denies other acute complaints.  Objective:   Intake/Output Summary (Last 24 hours) at 08/23/2024 0937 Last data filed at 08/23/2024 0636 Gross per 24 hour  Intake 2435.35 ml  Output --  Net 2435.35 ml     PHYSICAL EXAMINATION: ECOG PERFORMANCE STATUS: 2 - Symptomatic, <50% confined to bed  Vitals:   08/22/24 2229 08/23/24 0635  BP: 96/60 107/61  Pulse: 80 80  Resp: 18 18  Temp: 98.1 F (36.7 C) 98.4 F (36.9 C)  SpO2: 97% 93%   Filed Weights   08/21/24 1150  Weight: 208 lb 8.9 oz (94.6 kg)    GENERAL: alert, no distress and comfortable SKIN: skin color, texture,  turgor are normal, no rashes or significant lesions EYES: normal, conjunctiva are pink and non-injected, sclera clear OROPHARYNX: no exudate, no erythema and lips, buccal mucosa, and tongue normal  NECK: supple, thyroid  normal size, non-tender, without nodularity LYMPH: no palpable lymphadenopathy in the cervical, axillary or inguinal LUNGS:  clear to auscultation and percussion with normal breathing effort HEART: regular rate & rhythm and no murmurs and no lower extremity edema ABDOMEN: +abdominal tenderness MUSCULOSKELETAL: no cyanosis of digits and no clubbing  PSYCH: alert & oriented x 3 with fluent speech NEURO: no focal motor/sensory deficits   All questions were answered. The patient knows to call the clinic with any problems, questions or concerns.   The total time spent in the appointment was 40 minutes encounter with patient including review of chart and various tests results, discussions about plan of care and coordination of care plan  Jo JINNY Brunner, NP 08/23/2024 9:37 AM    Labs Reviewed:  Lab Results  Component Value Date   WBC 4.9 08/23/2024   HGB 12.2 08/23/2024   HCT 40.2 08/23/2024   MCV 87.4 08/23/2024   PLT 187 08/23/2024   Recent Labs    08/20/24 1921 08/22/24 0608 08/23/24 0616  NA 140 140 140  K 3.7 3.5 3.8  CL 100 101 101  CO2 29 30 30   GLUCOSE 160* 107* 109*  BUN 6 5* <5*  CREATININE 0.50 0.53 0.61  CALCIUM  9.9 9.4 9.6  GFRNONAA >60 >60 >60  PROT 8.0 6.3* 6.5  ALBUMIN 4.3 3.4* 3.5  AST 50* 38 38  ALT 60* 43 38  ALKPHOS 434* 336* 335*  BILITOT 0.3 0.4 0.4    Studies Reviewed:  CT ABDOMEN PELVIS W CONTRAST Result Date: 08/21/2024 EXAM: CT ABDOMEN AND PELVIS WITH CONTRAST 08/21/2024 01:01:53 AM TECHNIQUE: CT of the abdomen and pelvis was performed with the administration of 100 mL of iohexol  (OMNIPAQUE ) 300 MG/ML solution. Multiplanar reformatted images are provided for review. Automated exposure control, iterative reconstruction, and/or weight-based adjustment of the mA/kV was utilized to reduce the radiation dose to as low as reasonably achievable. COMPARISON: 04/19/2024, PET CT 08/08/24 CLINICAL HISTORY: Abdominal pain, acute, nonlocalized; RUQ, epigastric TTP, hx pancreatic cancer with mets to liver, starting chemo soon. FINDINGS: LOWER CHEST: No acute abnormality. LIVER: Numerous  low density masses throughout the liver compatible with metastases. Largest mass centrally in the liver measures up to 4.1 cm. GALLBLADDER AND BILE DUCTS: Gallbladder is unremarkable. No biliary ductal dilatation. SPLEEN: No acute abnormality. PANCREAS: Low density ill defined mass in the pancreatic body measures 2.9 cm. This is compatible with the patient's known pancreatic cancer . ADRENAL GLANDS: No acute abnormality. KIDNEYS, URETERS AND BLADDER: No stones in the kidneys or ureters. No hydronephrosis. No perinephric or periureteral stranding. Urinary bladder is unremarkable. GI AND BOWEL: Stomach demonstrates no acute abnormality. There is no bowel obstruction. PERITONEUM AND RETROPERITONEUM: No ascites. No free air. VASCULATURE: Aorta is normal in caliber. LYMPH NODES: No lymphadenopathy. REPRODUCTIVE ORGANS: No acute abnormality. BONES AND SOFT TISSUES: No acute osseous abnormality. No focal soft tissue abnormality. IMPRESSION: 1. Numerous hepatic metastases, largest 4.1 cm; progressive metastatic disease. 2. Pancreatic body mass 2.9 cm corresponding to known pancreatic cancer . Electronically signed by: Franky Crease MD 08/21/2024 01:29 AM EST RP Workstation: [id number]   NM PET Image Initial (PI) Skull Base To Thigh Result Date: 08/11/2024 EXAM: PET AND CT SKULL BASE TO MID THIGH 08/08/2024 95:73:75 PM TECHNIQUE: RADIOPHARMACEUTICAL: 10.70 mCi F-18 FDG Uptake time 60 minutes. Glucose level  108 mg/dl. PET imaging was acquired from the base of the skull to the mid thighs. Non-contrast enhanced computed tomography was obtained for attenuation correction and anatomic localization. COMPARISON: MRI dated 04/19/2024. CLINICAL HISTORY: Poorly differentiated carcinoma noted on liver biopsy, neuroendocrine features. Needs staging. Table formatting from the original note was not included. 10.70 mCi F-18 FDG @ 1514 IV in RT HAND//BWM-KC. FBG: 108 mg/dL. Indication: Poorly differentiated carcinoma noted on liver  biopsy, neuroendocrine versus papillary thyroid . Needs staging. FINDINGS: HEAD AND NECK: No metabolically active cervical lymphadenopathy. CHEST: No metabolically active pulmonary nodules. No lung metastasis. No metabolically active lymphadenopathy. ABDOMEN AND PELVIS: There are multiple intense metabolic activity within the liver, dominant lesion in the central left hepatic lobe with SUV max equal to 10.1. This lesion is subtly evident on noncontrast CT measuring 18 mm on image 91. Additional lesion in the posterior left hepatic lobe on image 103. Additional lesion lateral segment of the left hepatic lobe on imaging 108. The lesions appear progressed from MRI April 19, 2024. Within the central body of the pancreas, there is an intense focus of metabolic activity with SUV max equal to 9.3 image 108. This corresponds to focal enlargement of the mid body of the pancreas to 4.3 x 2.8 cm on image 109. There is interval resolution of the distal pancreatitis seen on comparison CT and MRI. No hypermetabolic abdominal lymph nodes are identified. The uterus and ovaries are normal. Physiologic activity within the gastrointestinal and genitourinary systems. BONES AND SOFT TISSUE: No abnormal FDG activity localizes to the bones. No metabolically active aggressive osseous lesion. IMPRESSION: 1. Hepatic lesions with intense metabolic activity, progressed from April 19, 2024 MRI, consistent with metastatic disease. 2. Hypermetabolic mass in the pancreatic body measuring approximately 4.3 x 2.8 cm. Presumed primary malignancy . 3. No hypermetabolic abdominal lymph nodes. 4. No skeletal metastasis. 5. No lung metastasis. 6. Interval resolution of distal pancreatitis. Electronically signed by: Norleen Boxer MD 08/11/2024 09:30 AM EDT RP Workstation: [id number]

## 2024-08-23 NOTE — Plan of Care (Signed)
   Problem: Education: Goal: Knowledge of General Education information will improve Description: Including pain rating scale, medication(s)/side effects and non-pharmacologic comfort measures Outcome: Progressing   Problem: Activity: Goal: Risk for activity intolerance will decrease Outcome: Progressing   Problem: Coping: Goal: Level of anxiety will decrease Outcome: Progressing

## 2024-08-23 NOTE — Plan of Care (Signed)
  Problem: Education: Goal: Knowledge of General Education information will improve Description: Including pain rating scale, medication(s)/side effects and non-pharmacologic comfort measures Outcome: Progressing   Problem: Health Behavior/Discharge Planning: Goal: Ability to manage health-related needs will improve Outcome: Progressing   Problem: Clinical Measurements: Goal: Ability to maintain clinical measurements within normal limits will improve Outcome: Progressing   Problem: Activity: Goal: Risk for activity intolerance will decrease Outcome: Progressing   Problem: Nutrition: Goal: Adequate nutrition will be maintained Outcome: Progressing   Problem: Coping: Goal: Level of anxiety will decrease Outcome: Progressing   Problem: Elimination: Goal: Will not experience complications related to bowel motility Outcome: Progressing Goal: Will not experience complications related to urinary retention Outcome: Progressing   Problem: Pain Managment: Goal: General experience of comfort will improve and/or be controlled Outcome: Progressing   Problem: Safety: Goal: Ability to remain free from injury will improve Outcome: Progressing

## 2024-08-24 ENCOUNTER — Other Ambulatory Visit: Payer: Self-pay | Admitting: Radiology

## 2024-08-24 ENCOUNTER — Inpatient Hospital Stay

## 2024-08-24 ENCOUNTER — Encounter: Payer: Self-pay | Admitting: Oncology

## 2024-08-24 DIAGNOSIS — C7A8 Other malignant neuroendocrine tumors: Secondary | ICD-10-CM | POA: Diagnosis not present

## 2024-08-24 DIAGNOSIS — K859 Acute pancreatitis without necrosis or infection, unspecified: Secondary | ICD-10-CM | POA: Diagnosis not present

## 2024-08-24 DIAGNOSIS — R7401 Elevation of levels of liver transaminase levels: Secondary | ICD-10-CM | POA: Diagnosis not present

## 2024-08-24 DIAGNOSIS — G893 Neoplasm related pain (acute) (chronic): Secondary | ICD-10-CM

## 2024-08-24 DIAGNOSIS — C259 Malignant neoplasm of pancreas, unspecified: Secondary | ICD-10-CM | POA: Diagnosis not present

## 2024-08-24 DIAGNOSIS — C787 Secondary malignant neoplasm of liver and intrahepatic bile duct: Secondary | ICD-10-CM | POA: Diagnosis not present

## 2024-08-24 LAB — LIPASE, BLOOD: Lipase: 487 U/L — ABNORMAL HIGH (ref 11–51)

## 2024-08-24 LAB — CBC WITH DIFFERENTIAL/PLATELET
Abs Immature Granulocytes: 0.02 K/uL (ref 0.00–0.07)
Basophils Absolute: 0 K/uL (ref 0.0–0.1)
Basophils Relative: 0 %
Eosinophils Absolute: 0 K/uL (ref 0.0–0.5)
Eosinophils Relative: 0 %
HCT: 39.9 % (ref 36.0–46.0)
Hemoglobin: 11.6 g/dL — ABNORMAL LOW (ref 12.0–15.0)
Immature Granulocytes: 0 %
Lymphocytes Relative: 18 %
Lymphs Abs: 0.9 K/uL (ref 0.7–4.0)
MCH: 25.4 pg — ABNORMAL LOW (ref 26.0–34.0)
MCHC: 29.1 g/dL — ABNORMAL LOW (ref 30.0–36.0)
MCV: 87.5 fL (ref 80.0–100.0)
Monocytes Absolute: 0.5 K/uL (ref 0.1–1.0)
Monocytes Relative: 10 %
Neutro Abs: 3.6 K/uL (ref 1.7–7.7)
Neutrophils Relative %: 72 %
Platelets: 198 K/uL (ref 150–400)
RBC: 4.56 MIL/uL (ref 3.87–5.11)
RDW: 13.1 % (ref 11.5–15.5)
WBC: 5.1 K/uL (ref 4.0–10.5)
nRBC: 0 % (ref 0.0–0.2)

## 2024-08-24 LAB — MAGNESIUM: Magnesium: 2.2 mg/dL (ref 1.7–2.4)

## 2024-08-24 LAB — COMPREHENSIVE METABOLIC PANEL WITH GFR
ALT: 47 U/L — ABNORMAL HIGH (ref 0–44)
AST: 54 U/L — ABNORMAL HIGH (ref 15–41)
Albumin: 3.2 g/dL — ABNORMAL LOW (ref 3.5–5.0)
Alkaline Phosphatase: 320 U/L — ABNORMAL HIGH (ref 38–126)
Anion gap: 8 (ref 5–15)
BUN: 9 mg/dL (ref 6–20)
CO2: 27 mmol/L (ref 22–32)
Calcium: 9.2 mg/dL (ref 8.9–10.3)
Chloride: 103 mmol/L (ref 98–111)
Creatinine, Ser: 0.55 mg/dL (ref 0.44–1.00)
GFR, Estimated: >60 mL/min (ref >60)
Glucose, Bld: 145 mg/dL — ABNORMAL HIGH (ref 70–99)
Potassium: 4.2 mmol/L (ref 3.5–5.1)
Sodium: 138 mmol/L (ref 135–145)
Total Bilirubin: 0.3 mg/dL (ref 0.0–1.2)
Total Protein: 6.1 g/dL — ABNORMAL LOW (ref 6.5–8.1)

## 2024-08-24 MED ORDER — BISACODYL 10 MG RE SUPP: 10.0000 mg | Freq: Every day | RECTAL | Status: DC | PRN

## 2024-08-24 MED ORDER — SODIUM CHLORIDE 0.9 % IV SOLN
100.0000 mg/m2 | Freq: Once | INTRAVENOUS | Status: AC
  Administered 2024-08-25: 200 mg via INTRAVENOUS
  Filled 2024-08-24 (×2): qty 10

## 2024-08-24 MED ORDER — DEXAMETHASONE SOD PHOSPHATE PF 10 MG/ML IJ SOLN
10.0000 mg | Freq: Once | INTRAMUSCULAR | Status: AC
  Administered 2024-08-25: 10 mg via INTRAVENOUS

## 2024-08-24 MED ORDER — SODIUM CHLORIDE 0.9 % IV SOLN
100.0000 mg/m2 | Freq: Once | INTRAVENOUS | Status: AC
  Administered 2024-08-24: 200 mg via INTRAVENOUS
  Filled 2024-08-24: qty 10

## 2024-08-24 MED ORDER — DEXAMETHASONE SOD PHOSPHATE PF 10 MG/ML IJ SOLN
10.0000 mg | Freq: Once | INTRAMUSCULAR | Status: AC
  Administered 2024-08-24: 10 mg via INTRAVENOUS

## 2024-08-24 MED ORDER — SENNOSIDES-DOCUSATE SODIUM 8.6-50 MG PO TABS
1.0000 | ORAL_TABLET | Freq: Two times a day (BID) | ORAL | Status: DC
  Administered 2024-08-24 – 2024-08-26 (×5): 1 via ORAL
  Filled 2024-08-24 (×5): qty 1

## 2024-08-24 NOTE — Plan of Care (Signed)

## 2024-08-24 NOTE — Progress Notes (Addendum)
 ADDENDUM:  Patient was personally and independently interviewed, examined and relevant elements of the history of present illness were reviewed in details and an assessment and plan was created. All elements of the patient's history of present illness, assessment and plan were discussed in detail with Jo JINNY Brunner, NP. The above documentation reflects our combined findings assessment and plan.    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  Got admitted with intractable abdominal pain.   Because of the nature of her malignancy, we decided to proceed with inpatient chemotherapy using cisplatin plus etoposide starting from 08/23/2024.  She is scheduled for Port-A-Cath placement later today, as it could not be done yesterday.  Alkaline phosphatase mildly elevated.   Due for cycle 1 day 2 of chemotherapy today.  Will proceed with chemo today.  Day 3 will be tomorrow and this will complete cycle 1.   Continue current pain regimen.   We expect pain to slowly improve once her disease status improves.  Plan for discharge to home following completion of chemotherapy, by Sunday or Monday, provided no other acute medical issues.  I will plan to see her back in the clinic for follow-up.  Please call us  with any questions.   Jo Allen   DOB:01-10-1966   FM#:[id number]      ASSESSMENT & PLAN:  Jo Allen is a 58 year old female with oncologic history significant for pancreatic cancer.  She was admitted at 08/21/2024 with complaints of worsening abdominal pain.  Chemotherapy started on 08/23/24.  Medical oncology following.    Pancreatic neuroendocrine tumor with hepatic mets - Diagnosed 07/04/2024 with poorly differentiated carcinoma with neuroendocrine differentiation. - PET scan done 08/08/2024 which showed multiple hepatic lesions. -  Chemotherapy was planned with cisplatin and etoposide for 6-8 cycles with restaging every 3 cycles.  Chemotherapy was due as outpatient 11/5 however due to hospital admission, we have planned to start treatment inpatient.     -- Initiated chemotherapy regimen with Cisplatin 150 mg + Etoposide 200 mg initiated 11/5.  Tolerating therapy well with no complaints offered.  Today is Cycle 1 Day 3 - plan for etoposide today. -- Needs port placement, pending today per IR.    -- Continue to monitor closely.  - Medical oncology/Dr. Mykalah Saari following closely.   Abdominal pain Acute pancreatitis  - Ongoing pain left side of abdomen, epigastric, and mid-back areas. - Continue pain medication regimen as ordered - Continue supportive care   Transaminitis - LFTs slightly elevated. Alk phos remains elevated.  Total bili WNL - Avoid hepatotoxic agents - Continue to monitor liver function   Hypertension Hyperlipidemia - On statin - Continue antihypertensives as ordered.      Code Status Full   Subjective:  Patient seen awake and alert laying in bed. Denies chest pain or GI symptoms.  Admits to ongoing left sided abdominal, epigastric, and mid back pain which is somewhat relieved with pain meds. Encouraged patient to ambulate and stay out of bed. Agrees to do so.  Objective:   Intake/Output Summary (Last 24 hours) at 08/24/2024 1020 Last data filed at 08/24/2024 0300 Gross per 24 hour  Intake 2656 ml  Output --  Net 2656 ml     PHYSICAL EXAMINATION: ECOG PERFORMANCE STATUS: 1 - Symptomatic but completely ambulatory  Vitals:   08/24/24 0509 08/24/24 0937  BP: 129/72 119/64  Pulse: 82 78  Resp: 14  Temp: 98.4 F (36.9 C)   SpO2: 95% 97%   Filed Weights   08/21/24 1150  Weight: 208 lb 8.9 oz (94.6 kg)    GENERAL: alert, no distress and comfortable SKIN: skin color, texture, turgor are normal, no rashes or significant lesions EYES: normal, conjunctiva are pink and non-injected,  sclera clear OROPHARYNX: no exudate, no erythema and lips, buccal mucosa, and tongue normal  NECK: supple, thyroid  normal size, non-tender, without nodularity LYMPH: no palpable lymphadenopathy in the cervical, axillary or inguinal LUNGS: clear to auscultation and percussion with normal breathing effort HEART: regular rate & rhythm and no murmurs and no lower extremity edema ABDOMEN: abdomen soft, non-tender and normal bowel sounds MUSCULOSKELETAL: no cyanosis of digits and no clubbing  PSYCH: alert & oriented x 3 with fluent speech NEURO: no focal motor/sensory deficits   All questions were answered. The patient knows to call the clinic with any problems, questions or concerns.   The total time spent in the appointment was 40 minutes encounter with patient including review of chart and various tests results, discussions about plan of care and coordination of care plan  Jo JINNY Brunner, NP 08/24/2024 10:20 AM    Labs Reviewed:  Lab Results  Component Value Date   WBC 5.1 08/24/2024   HGB 11.6 (L) 08/24/2024   HCT 39.9 08/24/2024   MCV 87.5 08/24/2024   PLT 198 08/24/2024   Recent Labs    08/22/24 0608 08/23/24 0616 08/24/24 0539  NA 140 140 138  K 3.5 3.8 4.2  CL 101 101 103  CO2 30 30 27   GLUCOSE 107* 109* 145*  BUN 5* <5* 9  CREATININE 0.53 0.61 0.55  CALCIUM  9.4 9.6 9.2  GFRNONAA >60 >60 >60  PROT 6.3* 6.5 6.1*  ALBUMIN 3.4* 3.5 3.2*  AST 38 38 54*  ALT 43 38 47*  ALKPHOS 336* 335* 320*  BILITOT 0.4 0.4 0.3    Studies Reviewed:  CT ABDOMEN PELVIS W CONTRAST Result Date: 08/21/2024 EXAM: CT ABDOMEN AND PELVIS WITH CONTRAST 08/21/2024 01:01:53 AM TECHNIQUE: CT of the abdomen and pelvis was performed with the administration of 100 mL of iohexol  (OMNIPAQUE ) 300 MG/ML solution. Multiplanar reformatted images are provided for review. Automated exposure control, iterative reconstruction, and/or weight-based adjustment of the mA/kV was utilized to reduce the radiation  dose to as low as reasonably achievable. COMPARISON: 04/19/2024, PET CT 08/08/24 CLINICAL HISTORY: Abdominal pain, acute, nonlocalized; RUQ, epigastric TTP, hx pancreatic cancer with mets to liver, starting chemo soon. FINDINGS: LOWER CHEST: No acute abnormality. LIVER: Numerous low density masses throughout the liver compatible with metastases. Largest mass centrally in the liver measures up to 4.1 cm. GALLBLADDER AND BILE DUCTS: Gallbladder is unremarkable. No biliary ductal dilatation. SPLEEN: No acute abnormality. PANCREAS: Low density ill defined mass in the pancreatic body measures 2.9 cm. This is compatible with the patient's known pancreatic cancer . ADRENAL GLANDS: No acute abnormality. KIDNEYS, URETERS AND BLADDER: No stones in the kidneys or ureters. No hydronephrosis. No perinephric or periureteral stranding. Urinary bladder is unremarkable. GI AND BOWEL: Stomach demonstrates no acute abnormality. There is no bowel obstruction. PERITONEUM AND RETROPERITONEUM: No ascites. No free air. VASCULATURE: Aorta is normal in caliber. LYMPH NODES: No lymphadenopathy. REPRODUCTIVE ORGANS: No acute abnormality. BONES AND SOFT TISSUES: No acute osseous abnormality. No focal soft tissue abnormality. IMPRESSION: 1. Numerous hepatic metastases, largest 4.1 cm; progressive metastatic disease. 2. Pancreatic body mass 2.9 cm corresponding to known pancreatic cancer . Electronically signed by: Franky Crease MD  08/21/2024 01:29 AM EST RP Workstation: [id number]   NM PET Image Initial (PI) Skull Base To Thigh Result Date: 08/11/2024 EXAM: PET AND CT SKULL BASE TO MID THIGH 08/08/2024 04:26:24 PM TECHNIQUE: RADIOPHARMACEUTICAL: 10.70 mCi F-18 FDG Uptake time 60 minutes. Glucose level 108 mg/dl. PET imaging was acquired from the base of the skull to the mid thighs. Non-contrast enhanced computed tomography was obtained for attenuation correction and anatomic localization. COMPARISON: MRI dated 04/19/2024. CLINICAL HISTORY:  Poorly differentiated carcinoma noted on liver biopsy, neuroendocrine features. Needs staging. Table formatting from the original note was not included. 10.70 mCi F-18 FDG @ 1514 IV in RT HAND//BWM-KC. FBG: 108 mg/dL. 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 Needs staging. FINDINGS: HEAD AND NECK: No metabolically active cervical lymphadenopathy. CHEST: No metabolically active pulmonary nodules. No lung metastasis. No metabolically active lymphadenopathy. ABDOMEN AND PELVIS: There are multiple intense metabolic activity within the liver, dominant lesion in the central left hepatic lobe with SUV max equal to 10.1. This lesion is subtly evident on noncontrast CT measuring 18 mm on image 91. Additional lesion in the posterior left hepatic lobe on image 103. Additional lesion lateral segment of the left hepatic lobe on imaging 108. The lesions appear progressed from MRI April 19, 2024. Within the central body of the pancreas, there is an intense focus of metabolic activity with SUV max equal to 9.3 image 108. This corresponds to focal enlargement of the mid body of the pancreas to 4.3 x 2.8 cm on image 109. There is interval resolution of the distal pancreatitis seen on comparison CT and MRI. No hypermetabolic abdominal lymph nodes are identified. The uterus and ovaries are normal. Physiologic activity within the gastrointestinal and genitourinary systems. BONES AND SOFT TISSUE: No abnormal FDG activity localizes to the bones. No metabolically active aggressive osseous lesion. IMPRESSION: 1. Hepatic lesions with intense metabolic activity, progressed from April 19, 2024 MRI, consistent with metastatic disease. 2. Hypermetabolic mass in the pancreatic body measuring approximately 4.3 x 2.8 cm. Presumed primary malignancy . 3. No hypermetabolic abdominal lymph nodes. 4. No skeletal metastasis. 5. No lung metastasis. 6. Interval resolution of distal  pancreatitis. Electronically signed by: Norleen Boxer MD 08/11/2024 09:30 AM EDT RP Workstation: [id number]

## 2024-08-24 NOTE — Progress Notes (Signed)
 Chemo RN presented to patient room for Day 2 etoposide infusion. Patient verified using two separate identifiers. Role of Chemo RN explained, questions answered. Consent for administration of etoposide located in chart. 20G PIV placed by IV team checked for blood return, flushed easily. Pre-medication administered through IV and allowed time to take effect. Etoposide infusion started. Approximately halfway through infusion, pt reported pain at IV site. RN noted infiltration above IV insertion site. Etoposide paused. Attempted to aspirate drug, but was still drawing back blood. IV removed intact. Heat pack applied per standing order instructions. Dr Autumn, pharmacy, and primary nurse made aware. New IV started in left forearm. Remainder of infusion completed without issue.

## 2024-08-24 NOTE — Progress Notes (Signed)
 Mobility Specialist Progress Note:   08/24/24 1429  Mobility  Activity Ambulated with assistance  Level of Assistance Contact guard assist, steadying assist  Assistive Device Other (Comment) (IV Pole)  Distance Ambulated (ft) 160 ft  Activity Response Tolerated well  Mobility Referral Yes  Mobility visit 1 Mobility  Mobility Specialist Start Time (ACUTE ONLY) 1341  Mobility Specialist Stop Time (ACUTE ONLY) 1355  Mobility Specialist Time Calculation (min) (ACUTE ONLY) 14 min   Pt was received in bed and agreed to mobility. Slightly dizzy sit to stand, but subsided once standing for a brief moment. No complaints during ambulation. Returned to bed with all needs met. Call bell in reach.  [employer] - Mobility Specialist

## 2024-08-24 NOTE — Progress Notes (Signed)
 PROGRESS NOTE    Jo Allen  FMW:[id number] DOB: 06/07/1966 DOA: 08/20/2024 PCP: [name], PA    Brief Narrative:   Jo Allen is a 58 y.o. female with past medical history significant for pancreatic neuroendocrine cancer with hepatic metastasis, HTN, HLD, CAD, GERD, mood disorder, asthma who presented to [facility] ED on 08/20/2024 with generalized abdominal pain associated nausea, poor oral intake.  Ongoing over the last 2-3 days.  Recently diagnosed with pancreatic cancer, yet to have started chemotherapy.  Denies tobacco or alcohol use.  In the ED, temperature 98.1 F, HR 94, RR 16, BP 169/102, SpO2 10 percent on room air.  WBC 7.2, hemoglobin 13.1, plate count 733.  Sodium 140, potassium 3.7, chloride 100, CO2 29, glucose 160, BUN 6, creatinine 0.50.  Alkaline phosphatase 434.  AST 50, ALT 60, total bilirubin 0.3.  Lipase 607.  High sensitive troponin less than 15.  COVID/influenza/RSV PCR negative.  Urinalysis unrevealing.  CT abdomen/pelvis with contrast with numerous hepatic metastasis, largest 4.1 cm with progressive metastatic disease, pancreatic body mass 2.9 cm corresponding to known pancreatic cancer.  TRH consulted for admission for further evaluation and management of acute pancreatitis, complicated by progressive pancreatic cancer with hepatic metastasis.  Assessment & Plan:   Acute pancreatitis Patient presenting to ED with 2-3-day history of nausea, abdominal pain.  LFTs notably elevated in the setting of progressive hepatic metastasis.  Lipase elevated 607. -- Lipase [phone] -- LR at 100 mL/h -- Currently n.p.o. pending Port-A-Cath placement -- Oxycodone  5 mg p.o. q4h PRN moderate pain -- Compazine 5 mg IV q6h PRN nausea/vomting -- Zofran  4mg  IV q6h PRN intractable nausea/vomiting -- Strict I's and O's, monitor emesis/bowel movements  Pancreatic neuroendocrine carcinoma with hepatic metastasis, progressive Recently diagnosed, follows with medical  oncology, Dr. Autumn outpatient.  Has yet to start chemotherapy.  Recent PET/CT 10/22 with multiple hepatic lesions.  CT abdomen/pelvis on admission with contrast with findings of numerous hepatic metastasis, largest 4.1 cm with progressive disease, pancreatic body mass 2.9 cm. -- Medical oncology following, appreciate assistance -- IR consulted for Port-A-Cath placement; plan for today -- Started on chemotherapy on 11/6  HTN -- Amlodipine  10 mg p.o. daily -- Metoprolol  succinate 50 mg p.o. daily  HLD CAD -- Holding home statin in the setting of elevated LFTs  GERD -- Protonix  40 mg IV every 24 hours  Mood disorder -- Bupropion  150 mg p.o. daily -- Seroquel  100 mg p.o. nightly -- Lamictal  200 mg p.o. twice daily  Asthma -- Albuterol  neb every 6 hours as needed wheezing/shortness of breath  Obesity, class I Body mass index is 35.8 kg/m.    DVT prophylaxis: enoxaparin  (LOVENOX ) injection 40 mg Start: 08/24/24 1200    Code Status: Full Code Family Communication: No family present bedside this morning  Disposition Plan:  Level of care: Med-Surg Status is: Inpatient Remains inpatient appropriate because: Pending Port-A-Cath placement; started on chemotherapy inpatient, needs further diet advancement with toleration    Consultants:  Medical oncology, Dr. Autumn Interventional radiology  Procedures:  Port-A-Cath placement: Pending  Antimicrobials:  None   Subjective: Patient seen examined bedside, lying in bed.  No family present.  Continues to complain of mild upper abdominal discomfort towards the right side.  Started on chemotherapy yesterday.  Still awaiting Port-A-Cath placement by IR, hopefully today.  Patient with no other specific questions, concerns or complaints at this time.  Denies headache, no dizziness, no chest pain, no palpitations, no shortness of breath, no fever/chills/night sweats, no current  vomiting/diarrhea, no focal weakness, no fatigue, no  paresthesias.  No acute events overnight per nursing staff.  Objective: Vitals:   08/23/24 1322 08/23/24 2016 08/24/24 0509 08/24/24 0937  BP: 116/67 136/78 129/72 119/64  Pulse: 76 85 82 78  Resp: 16 14 14    Temp: 98.9 F (37.2 C) 98.9 F (37.2 C) 98.4 F (36.9 C)   TempSrc: Oral Oral Oral   SpO2: 94% 93% 95% 97%  Weight:      Height:        Intake/Output Summary (Last 24 hours) at 08/24/2024 1055 Last data filed at 08/24/2024 0300 Gross per 24 hour  Intake 2656 ml  Output --  Net 2656 ml   Filed Weights   08/21/24 1150  Weight: 94.6 kg    Examination:  Physical Exam: GEN: NAD, alert and oriented x 3, obese HEENT: NCAT, PERRL, EOMI, sclera clear, MMM PULM: CTAB w/o wheezes/crackles, normal respiratory effort, room air CV: RRR w/o M/G/R GI: abd soft, NTND, + BS MSK: no peripheral edema, moves all extremities independently NEURO: No focal neurological deficit PSYCH: normal mood/affect Integumentary: No concerning rashes/lesions/wounds no exposed skin surfaces    Data Reviewed: I have personally reviewed following labs and imaging studies  CBC: Recent Labs  Lab 08/20/24 1921 08/22/24 0608 08/23/24 0616 08/24/24 0539  WBC 7.2 4.8 4.9 5.1  NEUTROABS  --   --  2.6 3.6  HGB 13.1 12.0 12.2 11.6*  HCT 43.1 40.0 40.2 39.9  MCV 84.7 88.3 87.4 87.5  PLT 266 196 187 198   Basic Metabolic Panel: Recent Labs  Lab 08/20/24 1921 08/22/24 0608 08/23/24 0616 08/24/24 0539  NA 140 140 140 138  K 3.7 3.5 3.8 4.2  CL 100 101 101 103  CO2 29 30 30 27   GLUCOSE 160* 107* 109* 145*  BUN 6 5* <5* 9  CREATININE 0.50 0.53 0.61 0.55  CALCIUM  9.9 9.4 9.6 9.2  MG  --   --  1.9 2.2   GFR: Estimated Creatinine Clearance: 83.5 mL/min (by C-G formula based on SCr of 0.55 mg/dL). Liver Function Tests: Recent Labs  Lab 08/20/24 1921 08/22/24 0608 08/23/24 0616 08/24/24 0539  AST 50* 38 38 54*  ALT 60* 43 38 47*  ALKPHOS 434* 336* 335* 320*  BILITOT 0.3 0.4 0.4 0.3   PROT 8.0 6.3* 6.5 6.1*  ALBUMIN 4.3 3.4* 3.5 3.2*   Recent Labs  Lab 08/20/24 1921 08/22/24 0608 08/23/24 0616 08/24/24 0539  LIPASE 607* 534* 484* 487*   No results for input(s): AMMONIA in the last 168 hours. Coagulation Profile: No results for input(s): INR, PROTIME in the last 168 hours. Cardiac Enzymes: No results for input(s): CKTOTAL, CKMB, CKMBINDEX, TROPONINI in the last 168 hours. BNP (last 3 results) No results for input(s): PROBNP in the last 8760 hours. HbA1C: No results for input(s): HGBA1C in the last 72 hours. CBG: No results for input(s): GLUCAP in the last 168 hours. Lipid Profile: No results for input(s): CHOL, HDL, LDLCALC, TRIG, CHOLHDL, LDLDIRECT in the last 72 hours. Thyroid  Function Tests: No results for input(s): TSH, T4TOTAL, FREET4, T3FREE, THYROIDAB in the last 72 hours. Anemia Panel: No results for input(s): VITAMINB12, FOLATE, FERRITIN, TIBC, IRON, RETICCTPCT in the last 72 hours. Sepsis Labs: No results for input(s): PROCALCITON, LATICACIDVEN in the last 168 hours.  Recent Results (from the past 240 hours)  Resp panel by RT-PCR (RSV, Flu A&B, Covid) Anterior Nasal Swab     Status: None   Collection Time: 08/20/24 11:12  PM   Specimen: Anterior Nasal Swab  Result Value Ref Range Status   SARS Coronavirus 2 by RT PCR NEGATIVE NEGATIVE Final    Comment: (NOTE) SARS-CoV-2 target nucleic acids are NOT DETECTED.  The SARS-CoV-2 RNA is generally detectable in upper respiratory specimens during the acute phase of infection. The lowest concentration of SARS-CoV-2 viral copies this assay can detect is 138 copies/mL. A negative result does not preclude SARS-Cov-2 infection and should not be used as the sole basis for treatment or other patient management decisions. A negative result may occur with  improper specimen collection/handling, submission of specimen other than nasopharyngeal swab,  presence of viral mutation(s) within the areas targeted by this assay, and inadequate number of viral copies(<138 copies/mL). A negative result must be combined with clinical observations, patient history, and epidemiological information. The expected result is Negative.  Fact Sheet for Patients:  bloggercourse.com  Fact Sheet for Healthcare Providers:  seriousbroker.it  This test is no t yet approved or cleared by the [place]  FDA and  has been authorized for detection and/or diagnosis of SARS-CoV-2 by FDA under an Emergency Use Authorization (EUA). This EUA will remain  in effect (meaning this test can be used) for the duration of the COVID-19 declaration under Section 564(b)(1) of the Act, 21 U.S.C.section 360bbb-3(b)(1), unless the authorization is terminated  or revoked sooner.       Influenza A by PCR NEGATIVE NEGATIVE Final   Influenza B by PCR NEGATIVE NEGATIVE Final    Comment: (NOTE) The Xpert Xpress SARS-CoV-2/FLU/RSV plus assay is intended as an aid in the diagnosis of influenza from Nasopharyngeal swab specimens and should not be used as a sole basis for treatment. Nasal washings and aspirates are unacceptable for Xpert Xpress SARS-CoV-2/FLU/RSV testing.  Fact Sheet for Patients: bloggercourse.com  Fact Sheet for Healthcare Providers: seriousbroker.it  This test is not yet approved or cleared by the [place]  FDA and has been authorized for detection and/or diagnosis of SARS-CoV-2 by FDA under an Emergency Use Authorization (EUA). This EUA will remain in effect (meaning this test can be used) for the duration of the COVID-19 declaration under Section 564(b)(1) of the Act, 21 U.S.C. section 360bbb-3(b)(1), unless the authorization is terminated or revoked.     Resp Syncytial Virus by PCR NEGATIVE NEGATIVE Final    Comment: (NOTE) Fact Sheet for  Patients: bloggercourse.com  Fact Sheet for Healthcare Providers: seriousbroker.it  This test is not yet approved or cleared by the [place]  FDA and has been authorized for detection and/or diagnosis of SARS-CoV-2 by FDA under an Emergency Use Authorization (EUA). This EUA will remain in effect (meaning this test can be used) for the duration of the COVID-19 declaration under Section 564(b)(1) of the Act, 21 U.S.C. section 360bbb-3(b)(1), unless the authorization is terminated or revoked.  Performed at [facility], [address], [place], [place] [address]          Radiology Studies: No results found.       Scheduled Meds:  amLODipine   10 mg Oral Daily   buPROPion   150 mg Oral Daily   dexamethasone  (DECADRON ) IVPB ([facility])  10 mg Intravenous Once   [START ON 08/25/2024] dexamethasone  (DECADRON ) IVPB ([facility])  10 mg Intravenous Once   enoxaparin  (LOVENOX ) injection  40 mg Subcutaneous Q24H   etoposide  100 mg/m2 (Treatment Plan Recorded) Intravenous Once   [START ON 08/25/2024] etoposide  100 mg/m2 (Treatment Plan Recorded) Intravenous Once   lamoTRIgine   200 mg Oral BID  lipase/protease/amylase  36,000 Units Oral TID WC   metoprolol  succinate  50 mg Oral Daily   pantoprazole  (PROTONIX ) IV  40 mg Intravenous Q24H   QUEtiapine   100 mg Oral QHS   senna-docusate  1 tablet Oral BID   Continuous Infusions:  sodium chloride  10 mL/hr at 08/24/24 0932     LOS: 3 days    Time spent: 47 minutes spent on 08/24/2024 caring for this patient face-to-face including chart review, ordering labs/tests, documenting, discussion with nursing staff, consultants, updating family and interview/physical exam    Camellia PARAS Jennah Satchell, DO [facility] Available via Epic secure chat 7am-7pm After these hours, please refer to coverage provider listed on [email or web address] 08/24/2024, 10:55 AM

## 2024-08-25 DIAGNOSIS — K858 Other acute pancreatitis without necrosis or infection: Secondary | ICD-10-CM | POA: Diagnosis not present

## 2024-08-25 DIAGNOSIS — C259 Malignant neoplasm of pancreas, unspecified: Secondary | ICD-10-CM | POA: Diagnosis not present

## 2024-08-25 DIAGNOSIS — C787 Secondary malignant neoplasm of liver and intrahepatic bile duct: Secondary | ICD-10-CM | POA: Diagnosis not present

## 2024-08-25 LAB — CBC WITH DIFFERENTIAL/PLATELET
Abs Immature Granulocytes: 0.04 K/uL (ref 0.00–0.07)
Basophils Absolute: 0 K/uL (ref 0.0–0.1)
Basophils Relative: 0 %
Eosinophils Absolute: 0 K/uL (ref 0.0–0.5)
Eosinophils Relative: 0 %
HCT: 39 % (ref 36.0–46.0)
Hemoglobin: 11.4 g/dL — ABNORMAL LOW (ref 12.0–15.0)
Immature Granulocytes: 1 %
Lymphocytes Relative: 21 %
Lymphs Abs: 1.2 K/uL (ref 0.7–4.0)
MCH: 25.9 pg — ABNORMAL LOW (ref 26.0–34.0)
MCHC: 29.2 g/dL — ABNORMAL LOW (ref 30.0–36.0)
MCV: 88.4 fL (ref 80.0–100.0)
Monocytes Absolute: 0.5 K/uL (ref 0.1–1.0)
Monocytes Relative: 9 %
Neutro Abs: 4 K/uL (ref 1.7–7.7)
Neutrophils Relative %: 69 %
Platelets: 192 K/uL (ref 150–400)
RBC: 4.41 MIL/uL (ref 3.87–5.11)
RDW: 13.2 % (ref 11.5–15.5)
WBC: 5.7 K/uL (ref 4.0–10.5)
nRBC: 0 % (ref 0.0–0.2)

## 2024-08-25 LAB — COMPREHENSIVE METABOLIC PANEL WITH GFR
ALT: 58 U/L — ABNORMAL HIGH (ref 0–44)
AST: 66 U/L — ABNORMAL HIGH (ref 15–41)
Albumin: 3.2 g/dL — ABNORMAL LOW (ref 3.5–5.0)
Alkaline Phosphatase: 304 U/L — ABNORMAL HIGH (ref 38–126)
Anion gap: 7 (ref 5–15)
BUN: 11 mg/dL (ref 6–20)
CO2: 27 mmol/L (ref 22–32)
Calcium: 9.2 mg/dL (ref 8.9–10.3)
Chloride: 104 mmol/L (ref 98–111)
Creatinine, Ser: 0.6 mg/dL (ref 0.44–1.00)
GFR, Estimated: >60 mL/min (ref >60)
Glucose, Bld: 128 mg/dL — ABNORMAL HIGH (ref 70–99)
Potassium: 4.1 mmol/L (ref 3.5–5.1)
Sodium: 137 mmol/L (ref 135–145)
Total Bilirubin: 0.4 mg/dL (ref 0.0–1.2)
Total Protein: 6 g/dL — ABNORMAL LOW (ref 6.5–8.1)

## 2024-08-25 LAB — LIPASE, BLOOD: Lipase: 464 U/L — ABNORMAL HIGH (ref 11–51)

## 2024-08-25 LAB — MAGNESIUM: Magnesium: 2.2 mg/dL (ref 1.7–2.4)

## 2024-08-25 MED ORDER — GABAPENTIN 300 MG PO CAPS
300.0000 mg | ORAL_CAPSULE | Freq: Once | ORAL | Status: AC
  Administered 2024-08-25: 300 mg via ORAL
  Filled 2024-08-25: qty 1

## 2024-08-25 MED ORDER — PANTOPRAZOLE SODIUM 40 MG PO TBEC
40.0000 mg | DELAYED_RELEASE_TABLET | Freq: Every day | ORAL | Status: DC
  Administered 2024-08-25 – 2024-08-26 (×2): 40 mg via ORAL
  Filled 2024-08-25 (×2): qty 1

## 2024-08-25 MED ORDER — SMOG ENEMA
960.0000 mL | Freq: Once | RECTAL | Status: AC
  Administered 2024-08-25: 960 mL via RECTAL
  Filled 2024-08-25: qty 960

## 2024-08-25 NOTE — Progress Notes (Signed)
 Chemotherapy education reinforced with pt and family member at bedside. No questions or concerns at this time. New IV site obtained by IV team with ultrasound. Good blood return noted prior to starting chemotherapy infusion. Consent verified in chart.

## 2024-08-25 NOTE — Plan of Care (Signed)
  Problem: Education: Goal: Knowledge of General Education information will improve Description: Including pain rating scale, medication(s)/side effects and non-pharmacologic comfort measures Outcome: Progressing   Problem: Health Behavior/Discharge Planning: Goal: Ability to manage health-related needs will improve Outcome: Progressing   Problem: Clinical Measurements: Goal: Diagnostic test results will improve Outcome: Progressing Goal: Cardiovascular complication will be avoided Outcome: Progressing   Problem: Clinical Measurements: Goal: Ability to maintain clinical measurements within normal limits will improve Outcome: Not Progressing Goal: Will remain free from infection Outcome: Not Progressing Goal: Respiratory complications will improve Outcome: Not Progressing

## 2024-08-25 NOTE — Progress Notes (Signed)
 PROGRESS NOTE    Jo Allen  FMW:[id number] DOB: [birth year] DOA: 08/20/2024 PCP: [name], PA    Brief Narrative:   Jo Allen is a 58 y.o. female with past medical history significant for pancreatic neuroendocrine cancer with hepatic metastasis, HTN, HLD, CAD, GERD, mood disorder, asthma who presented to [facility] ED on 08/20/2024 with generalized abdominal pain associated nausea, poor oral intake.  Ongoing over the last 2-3 days.  Recently diagnosed with pancreatic cancer, yet to have started chemotherapy.  Denies tobacco or alcohol use.  In the ED, temperature 98.1 F, HR 94, RR 16, BP 169/102, SpO2 10 percent on room air.  WBC 7.2, hemoglobin 13.1, plate count 733.  Sodium 140, potassium 3.7, chloride 100, CO2 29, glucose 160, BUN 6, creatinine 0.50.  Alkaline phosphatase 434.  AST 50, ALT 60, total bilirubin 0.3.  Lipase 607.  High sensitive troponin less than 15.  COVID/influenza/RSV PCR negative.  Urinalysis unrevealing.  CT abdomen/pelvis with contrast with numerous hepatic metastasis, largest 4.1 cm with progressive metastatic disease, pancreatic body mass 2.9 cm corresponding to known pancreatic cancer.  TRH consulted for admission for further evaluation and management of acute pancreatitis, complicated by progressive pancreatic cancer with hepatic metastasis.  Assessment & Plan:   Acute pancreatitis Patient presenting to ED with 2-3-day history of nausea, abdominal pain.  LFTs notably elevated in the setting of progressive hepatic metastasis.  Lipase elevated 607. -- Lipase 351 142 5367 -- Soft diet -- Oxycodone  5 mg p.o. q4h PRN moderate pain -- Compazine 5 mg IV q6h PRN nausea/vomting -- Zofran  4mg  IV q6h PRN intractable nausea/vomiting -- Strict I's and O's, monitor emesis/bowel movements  Pancreatic neuroendocrine carcinoma with hepatic metastasis, progressive Recently diagnosed, follows with medical oncology, Dr. Autumn outpatient.  Has yet to start  chemotherapy.  Recent PET/CT 10/22 with multiple hepatic lesions.  CT abdomen/pelvis on admission with contrast with findings of numerous hepatic metastasis, largest 4.1 cm with progressive disease, pancreatic body mass 2.9 cm. -- Medical oncology following, appreciate assistance -- IR consulted for Port-A-Cath placement; plan outpatient -- Started on chemotherapy on 11/6  Constipation -- Senokot-S1 tablet p.o. twice daily -- MiraLAX  daily as needed mild constipation -- Bisacodyl  10 mg PR daily as needed severe constipation -- SMOG enema x 1 today  HTN -- Amlodipine  10 mg p.o. daily -- Metoprolol  succinate 50 mg p.o. daily  HLD CAD -- Holding home statin in the setting of elevated LFTs  GERD -- Protonix  40 mg IV every 24 hours  Mood disorder -- Bupropion  150 mg p.o. daily -- Seroquel  100 mg p.o. nightly -- Lamictal  200 mg p.o. twice daily  Asthma -- Albuterol  neb every 6 hours as needed wheezing/shortness of breath  Obesity, class I Body mass index is 35.8 kg/m.    DVT prophylaxis: enoxaparin  (LOVENOX ) injection 40 mg Start: 08/24/24 1200    Code Status: Full Code Family Communication: No family present bedside this morning  Disposition Plan:  Level of care: Med-Surg Status is: Inpatient Remains inpatient appropriate because: Inpatient chemotherapy, anticipate discharge home tomorrow  Consultants:  Medical oncology, Dr. Autumn Interventional radiology  Procedures:  Port-A-Cath placement: Pending  Antimicrobials:  None   Subjective: Patient seen examined bedside, lying in bed.  No family present.  Complaining of not having a bowel movement, discussed we will perform smog enema following chemotherapy this morning.  Unfortunately IR unable to place Port-A-Cath, likely deferring to outpatient.  To complete initial round of chemotherapy today.  Patient with no other specific questions, concerns or  complaints at this time.  Denies headache, no dizziness, no chest  pain, no palpitations, no shortness of breath, no fever/chills/night sweats, no current vomiting/diarrhea, no focal weakness, no fatigue, no paresthesias.  No acute events overnight per nursing staff.  Discussed anticipated discharge home tomorrow.  Objective: Vitals:   08/24/24 0937 08/24/24 1305 08/24/24 2120 08/25/24 0532  BP: 119/64 125/70 115/62 119/67  Pulse: 78 74 73 78  Resp:  16 14 14   Temp:  (!) 97.4 F (36.3 C) 98.3 F (36.8 C) 98.1 F (36.7 C)  TempSrc:  Oral Oral Oral  SpO2: 97% 91% 92% 94%  Weight:      Height:        Intake/Output Summary (Last 24 hours) at 08/25/2024 1047 Last data filed at 08/25/2024 1005 Gross per 24 hour  Intake 1122.28 ml  Output 1 ml  Net 1121.28 ml   Filed Weights   08/21/24 1150  Weight: 94.6 kg    Examination:  Physical Exam: GEN: NAD, alert and oriented x 3, obese HEENT: NCAT, PERRL, EOMI, sclera clear, MMM PULM: CTAB w/o wheezes/crackles, normal respiratory effort, room air CV: RRR w/o M/G/R GI: abd soft, NTND, + BS MSK: no peripheral edema, moves all extremities independently NEURO: No focal neurological deficit PSYCH: normal mood/affect Integumentary: No concerning rashes/lesions/wounds no exposed skin surfaces    Data Reviewed: I have personally reviewed following labs and imaging studies  CBC: Recent Labs  Lab 08/20/24 1921 08/22/24 0608 08/23/24 0616 08/24/24 0539 08/25/24 0717  WBC 7.2 4.8 4.9 5.1 5.7  NEUTROABS  --   --  2.6 3.6 4.0  HGB 13.1 12.0 12.2 11.6* 11.4*  HCT 43.1 40.0 40.2 39.9 39.0  MCV 84.7 88.3 87.4 87.5 88.4  PLT 266 196 187 198 192   Basic Metabolic Panel: Recent Labs  Lab 08/20/24 1921 08/22/24 0608 08/23/24 0616 08/24/24 0539 08/25/24 0717  NA 140 140 140 138 137  K 3.7 3.5 3.8 4.2 4.1  CL 100 101 101 103 104  CO2 29 30 30 27 27   GLUCOSE 160* 107* 109* 145* 128*  BUN 6 5* <5* 9 11  CREATININE 0.50 0.53 0.61 0.55 0.60  CALCIUM  9.9 9.4 9.6 9.2 9.2  MG  --   --  1.9 2.2 2.2    GFR: Estimated Creatinine Clearance: 83.5 mL/min (by C-G formula based on SCr of 0.6 mg/dL). Liver Function Tests: Recent Labs  Lab 08/20/24 1921 08/22/24 0608 08/23/24 0616 08/24/24 0539 08/25/24 0717  AST 50* 38 38 54* 66*  ALT 60* 43 38 47* 58*  ALKPHOS 434* 336* 335* 320* 304*  BILITOT 0.3 0.4 0.4 0.3 0.4  PROT 8.0 6.3* 6.5 6.1* 6.0*  ALBUMIN 4.3 3.4* 3.5 3.2* 3.2*   Recent Labs  Lab 08/20/24 1921 08/22/24 0608 08/23/24 0616 08/24/24 0539 08/25/24 0717  LIPASE 607* 534* 484* 487* 464*   No results for input(s): AMMONIA in the last 168 hours. Coagulation Profile: No results for input(s): INR, PROTIME in the last 168 hours. Cardiac Enzymes: No results for input(s): CKTOTAL, CKMB, CKMBINDEX, TROPONINI in the last 168 hours. BNP (last 3 results) No results for input(s): PROBNP in the last 8760 hours. HbA1C: No results for input(s): HGBA1C in the last 72 hours. CBG: No results for input(s): GLUCAP in the last 168 hours. Lipid Profile: No results for input(s): CHOL, HDL, LDLCALC, TRIG, CHOLHDL, LDLDIRECT in the last 72 hours. Thyroid  Function Tests: No results for input(s): TSH, T4TOTAL, FREET4, T3FREE, THYROIDAB in the last 72 hours. Anemia  Panel: No results for input(s): VITAMINB12, FOLATE, FERRITIN, TIBC, IRON, RETICCTPCT in the last 72 hours. Sepsis Labs: No results for input(s): PROCALCITON, LATICACIDVEN in the last 168 hours.  Recent Results (from the past 240 hours)  Resp panel by RT-PCR (RSV, Flu A&B, Covid) Anterior Nasal Swab     Status: None   Collection Time: 08/20/24 11:12 PM   Specimen: Anterior Nasal Swab  Result Value Ref Range Status   SARS Coronavirus 2 by RT PCR NEGATIVE NEGATIVE Final    Comment: (NOTE) SARS-CoV-2 target nucleic acids are NOT DETECTED.  The SARS-CoV-2 RNA is generally detectable in upper respiratory specimens during the acute phase of infection. The  lowest concentration of SARS-CoV-2 viral copies this assay can detect is 138 copies/mL. A negative result does not preclude SARS-Cov-2 infection and should not be used as the sole basis for treatment or other patient management decisions. A negative result may occur with  improper specimen collection/handling, submission of specimen other than nasopharyngeal swab, presence of viral mutation(s) within the areas targeted by this assay, and inadequate number of viral copies(<138 copies/mL). A negative result must be combined with clinical observations, patient history, and epidemiological information. The expected result is Negative.  Fact Sheet for Patients:  bloggercourse.com  Fact Sheet for Healthcare Providers:  seriousbroker.it  This test is no t yet approved or cleared by the [place]  FDA and  has been authorized for detection and/or diagnosis of SARS-CoV-2 by FDA under an Emergency Use Authorization (EUA). This EUA will remain  in effect (meaning this test can be used) for the duration of the COVID-19 declaration under Section 564(b)(1) of the Act, 21 U.S.C.section 360bbb-3(b)(1), unless the authorization is terminated  or revoked sooner.       Influenza A by PCR NEGATIVE NEGATIVE Final   Influenza B by PCR NEGATIVE NEGATIVE Final    Comment: (NOTE) The Xpert Xpress SARS-CoV-2/FLU/RSV plus assay is intended as an aid in the diagnosis of influenza from Nasopharyngeal swab specimens and should not be used as a sole basis for treatment. Nasal washings and aspirates are unacceptable for Xpert Xpress SARS-CoV-2/FLU/RSV testing.  Fact Sheet for Patients: bloggercourse.com  Fact Sheet for Healthcare Providers: seriousbroker.it  This test is not yet approved or cleared by the [place]  FDA and has been authorized for detection and/or diagnosis of SARS-CoV-2 by FDA under  an Emergency Use Authorization (EUA). This EUA will remain in effect (meaning this test can be used) for the duration of the COVID-19 declaration under Section 564(b)(1) of the Act, 21 U.S.C. section 360bbb-3(b)(1), unless the authorization is terminated or revoked.     Resp Syncytial Virus by PCR NEGATIVE NEGATIVE Final    Comment: (NOTE) Fact Sheet for Patients: bloggercourse.com  Fact Sheet for Healthcare Providers: seriousbroker.it  This test is not yet approved or cleared by the [place]  FDA and has been authorized for detection and/or diagnosis of SARS-CoV-2 by FDA under an Emergency Use Authorization (EUA). This EUA will remain in effect (meaning this test can be used) for the duration of the COVID-19 declaration under Section 564(b)(1) of the Act, 21 U.S.C. section 360bbb-3(b)(1), unless the authorization is terminated or revoked.  Performed at [name], [address] [place], [place] [address]          Radiology Studies: No results found.       Scheduled Meds:  amLODipine   10 mg Oral Daily   buPROPion   150 mg Oral Daily   enoxaparin  (LOVENOX ) injection  40  mg Subcutaneous Q24H   etoposide  100 mg/m2 (Treatment Plan Recorded) Intravenous Once   lamoTRIgine   200 mg Oral BID   lipase/protease/amylase  36,000 Units Oral TID WC   metoprolol  succinate  50 mg Oral Daily   pantoprazole   40 mg Oral Daily   QUEtiapine   100 mg Oral QHS   senna-docusate  1 tablet Oral BID   SMOG  960 mL Rectal Once   Continuous Infusions:  sodium chloride  10 mL/hr at 08/25/24 1009     LOS: 4 days    Time spent: 47 minutes spent on 08/25/2024 caring for this patient face-to-face including chart review, ordering labs/tests, documenting, discussion with nursing staff, consultants, updating family and interview/physical exam    Camellia PARAS Keonta Alsip, DO [facility] Available via Epic secure chat  7am-7pm After these hours, please refer to coverage provider listed on [email or web address] 08/25/2024, 10:48 AM

## 2024-08-26 DIAGNOSIS — C787 Secondary malignant neoplasm of liver and intrahepatic bile duct: Secondary | ICD-10-CM | POA: Diagnosis not present

## 2024-08-26 DIAGNOSIS — C259 Malignant neoplasm of pancreas, unspecified: Secondary | ICD-10-CM | POA: Diagnosis not present

## 2024-08-26 DIAGNOSIS — K859 Acute pancreatitis without necrosis or infection, unspecified: Secondary | ICD-10-CM | POA: Diagnosis not present

## 2024-08-26 LAB — CBC WITH DIFFERENTIAL/PLATELET
Abs Immature Granulocytes: 0.03 K/uL (ref 0.00–0.07)
Basophils Absolute: 0 K/uL (ref 0.0–0.1)
Basophils Relative: 0 %
Eosinophils Absolute: 0 K/uL (ref 0.0–0.5)
Eosinophils Relative: 1 %
HCT: 38.1 % (ref 36.0–46.0)
Hemoglobin: 11.3 g/dL — ABNORMAL LOW (ref 12.0–15.0)
Immature Granulocytes: 1 %
Lymphocytes Relative: 36 %
Lymphs Abs: 1.8 K/uL (ref 0.7–4.0)
MCH: 26.9 pg (ref 26.0–34.0)
MCHC: 29.7 g/dL — ABNORMAL LOW (ref 30.0–36.0)
MCV: 90.7 fL (ref 80.0–100.0)
Monocytes Absolute: 0.2 K/uL (ref 0.1–1.0)
Monocytes Relative: 3 %
Neutro Abs: 3 K/uL (ref 1.7–7.7)
Neutrophils Relative %: 59 %
Platelets: 183 K/uL (ref 150–400)
RBC: 4.2 MIL/uL (ref 3.87–5.11)
RDW: 13.2 % (ref 11.5–15.5)
WBC: 5 K/uL (ref 4.0–10.5)
nRBC: 0 % (ref 0.0–0.2)

## 2024-08-26 LAB — COMPREHENSIVE METABOLIC PANEL WITH GFR
ALT: 68 U/L — ABNORMAL HIGH (ref 0–44)
AST: 79 U/L — ABNORMAL HIGH (ref 15–41)
Albumin: 3.2 g/dL — ABNORMAL LOW (ref 3.5–5.0)
Alkaline Phosphatase: 304 U/L — ABNORMAL HIGH (ref 38–126)
Anion gap: 10 (ref 5–15)
BUN: 11 mg/dL (ref 6–20)
CO2: 26 mmol/L (ref 22–32)
Calcium: 9.4 mg/dL (ref 8.9–10.3)
Chloride: 102 mmol/L (ref 98–111)
Creatinine, Ser: 0.64 mg/dL (ref 0.44–1.00)
GFR, Estimated: >60 mL/min (ref >60)
Glucose, Bld: 102 mg/dL — ABNORMAL HIGH (ref 70–99)
Potassium: 4 mmol/L (ref 3.5–5.1)
Sodium: 138 mmol/L (ref 135–145)
Total Bilirubin: 0.3 mg/dL (ref 0.0–1.2)
Total Protein: 5.9 g/dL — ABNORMAL LOW (ref 6.5–8.1)

## 2024-08-26 LAB — MAGNESIUM: Magnesium: 2.3 mg/dL (ref 1.7–2.4)

## 2024-08-26 MED ORDER — NITROGLYCERIN 0.4 MG SL SUBL: 0.4000 mg | SUBLINGUAL_TABLET | SUBLINGUAL | 0 refills | Status: AC | PRN

## 2024-08-26 MED ORDER — POLYETHYLENE GLYCOL 3350 17 G PO PACK: 17.0000 g | PACK | Freq: Every day | ORAL | 2 refills | Status: AC | PRN

## 2024-08-26 MED ORDER — METOPROLOL SUCCINATE ER 50 MG PO TB24: 50.0000 mg | ORAL_TABLET | Freq: Every day | ORAL | 0 refills | Status: AC

## 2024-08-26 MED ORDER — SENNOSIDES-DOCUSATE SODIUM 8.6-50 MG PO TABS: 1.0000 | ORAL_TABLET | Freq: Two times a day (BID) | ORAL | 2 refills | Status: AC

## 2024-08-26 MED ORDER — OXYCODONE HCL 5 MG PO TABS: 5.0000 mg | ORAL_TABLET | Freq: Four times a day (QID) | ORAL | 0 refills | Status: DC | PRN

## 2024-08-26 MED ORDER — PANTOPRAZOLE SODIUM 40 MG PO TBEC: 40.0000 mg | DELAYED_RELEASE_TABLET | Freq: Every day | ORAL | 0 refills | Status: DC

## 2024-08-26 MED ORDER — ONDANSETRON HCL 8 MG PO TABS: 8.0000 mg | ORAL_TABLET | Freq: Three times a day (TID) | ORAL | 1 refills | Status: AC | PRN

## 2024-08-26 NOTE — Discharge Summary (Signed)
 Physician Discharge Summary  Jo Allen FMW:[id number] DOB: 04-14-1966 DOA: 08/20/2024  PCP: [name], PA  Admit date: 08/20/2024 Discharge date: 08/26/2024  Admitted From: Home Disposition: Home  Recommendations for Outpatient Follow-up:  Follow up with PCP in 1-2 weeks Follow-up with medical oncology, Dr. Autumn Outpatient follow-up with interventional radiology for Port-A-Cath placement Recommend repeat CMP 1 week to reevaluate LFTs  Home Health: No Equipment/Devices: None  Discharge Condition: Stable CODE STATUS: Full code Diet recommendation: Heart healthy diet  History of present illness:  Jo Allen is a 58 y.o. female with past medical history significant for pancreatic neuroendocrine cancer with hepatic metastasis, HTN, HLD, CAD, GERD, mood disorder, asthma who presented to [facility] ED on 08/20/2024 with generalized abdominal pain associated nausea, poor oral intake.  Ongoing over the last 2-3 days.  Recently diagnosed with pancreatic cancer, yet to have started chemotherapy.  Denies tobacco or alcohol use.   In the ED, temperature 98.1 F, HR 94, RR 16, BP 169/102, SpO2 10 percent on room air.  WBC 7.2, hemoglobin 13.1, plate count 733.  Sodium 140, potassium 3.7, chloride 100, CO2 29, glucose 160, BUN 6, creatinine 0.50.  Alkaline phosphatase 434.  AST 50, ALT 60, total bilirubin 0.3.  Lipase 607.  High sensitive troponin less than 15.  COVID/influenza/RSV PCR negative.  Urinalysis unrevealing.  CT abdomen/pelvis with contrast with numerous hepatic metastasis, largest 4.1 cm with progressive metastatic disease, pancreatic body mass 2.9 cm corresponding to known pancreatic cancer.  TRH consulted for admission for further evaluation and management of acute pancreatitis, complicated by progressive pancreatic cancer with hepatic metastasis.  Hospital course:  Acute pancreatitis History of pancreatic insufficiency Patient presenting to ED with 2-3-day history of  nausea, abdominal pain.  LFTs notably elevated in the setting of progressive hepatic metastasis.  Lipase elevated 607.  Patient was started on IV fluids, diet was slowly advanced with toleration.  Resume Creon .   Pancreatic neuroendocrine carcinoma with hepatic metastasis, progressive Recently diagnosed, follows with medical oncology, Dr. Autumn outpatient.  Has yet to start chemotherapy.  Recent PET/CT 10/22 with multiple hepatic lesions.  CT abdomen/pelvis on admission with contrast with findings of numerous hepatic metastasis, largest 4.1 cm with progressive disease, pancreatic body mass 2.9 cm.  Medical oncology was consulted and followed her in hospital course.  Patient was started on chemotherapy on 08/23/2024.  Unfortunately unable to have Port-A-Cath placed by IR while inpatient, will need close outpatient follow-up.  Outpatient follow-up with Dr. Autumn.   Opioid-induced constipation Senokot-S 1 tablet p.o. twice daily,  MiraLAX  daily as needed mild constipation   HTN Continue amlodipine  10 mg p.o. daily, Metoprolol  succinate 50 mg p.o. daily   HLD CAD Resume statin on discharge.  Recommend repeat CMP 1 week.  If LFTs worsen, recommend discontinuation of statin.   GERD Protonix  40 mg p.o. daily   Mood disorder Bupropion  150 mg p.o. daily, Seroquel  100 mg p.o. nightly, Lamictal  200 mg p.o. twice daily   Asthma Stable, not oxygen dependent.   Obesity, class I Body mass index is 35.8 kg/m.  Discharge Diagnoses:  Active Problems:   Acute pancreatitis   Primary pancreatic cancer with metastasis to other site [facility])   Essential hypertension   Dyslipidemia   GERD (gastroesophageal reflux disease)   Cancer related pain   Mood disorder   Transaminitis   Epigastric pain   Malignant poorly differentiated neuroendocrine carcinoma [facility])    Discharge Instructions  Discharge Instructions     CISPLATIN TREATMENT CONDITION  Complete by: As directed    Notify MD if urine output <  200 mL prior to cisplatin.   Call MD for:  difficulty breathing, headache or visual disturbances   Complete by: As directed    Call MD for:  extreme fatigue   Complete by: As directed    Call MD for:  persistant dizziness or light-headedness   Complete by: As directed    Call MD for:  persistant nausea and vomiting   Complete by: As directed    Call MD for:  severe uncontrolled pain   Complete by: As directed    Call MD for:  temperature >100.4   Complete by: As directed    Diet - low sodium heart healthy   Complete by: As directed    Increase activity slowly   Complete by: As directed    TREATMENT CONDITION 4   Complete by: As directed    Patient should have a magnesium level within 7 days prior to chemotherapy administration. Notify MD if Mg < 1.7   TREATMENT CONDITIONS   Complete by: As directed    Patient should have CBC & CMP within 7 days prior to chemotherapy administration. NOTIFY MD IF: ANC < 1500, Hemoglobin < 8, PLT < 100,000,  Total Bili > 1.5, Creatinine > 1.5, ALT or  AST > 80 or if patient has unstable vital signs: Temperature >= 100.38F, SBP > 180 or < 90, RR > 30 or HR > 100.      Allergies as of 08/26/2024       Reactions   Trazodone Nausea Only, Other (See Comments)   Dizziness, headaches - also   Penicillins Hives, Rash   Morphine  Sulfate Hives, Itching, Nausea And Vomiting   Sm Baby Powder Cornstarch [powders] Itching, Other (See Comments)   Patient is allergic to the powder in latex gloves- NOT the latex        Medication List     STOP taking these medications    cyclobenzaprine  10 MG tablet Commonly known as: FLEXERIL    lidocaine -prilocaine cream Commonly known as: EMLA   traMADol  50 MG tablet Commonly known as: ULTRAM        TAKE these medications    albuterol  108 (90 Base) MCG/ACT inhaler Commonly known as: VENTOLIN  HFA Inhale 1-2 puffs into the lungs every 6 (six) hours as needed for wheezing or shortness of breath.   Aleve  220 MG  tablet Generic drug: naproxen  sodium Take 220-440 mg by mouth 2 (two) times daily as needed (for pain or headaches).   amLODipine  10 MG tablet Commonly known as: NORVASC  Take 1 tablet (10 mg total) by mouth daily.   Biotin  1000 MCG Chew Chew 2,000 mcg by mouth daily.   buPROPion  150 MG 24 hr tablet Commonly known as: WELLBUTRIN  XL Take 150 mg by mouth daily.   CRANBERRY PO Take 1 tablet by mouth daily.   Creon  36000-114000 units Cpep capsule Generic drug: lipase/protease/amylase Take 1 capsule (36,000 Units total) by mouth 3 (three) times daily with meals.   cyanocobalamin  1000 MCG tablet Commonly known as: VITAMIN B12 Take 1,000 mcg by mouth daily.   dexamethasone  4 MG tablet Commonly known as: DECADRON  Take for 1 day starting the day after chemotherapy on day 4. Take with food.   ferrous sulfate  325 (65 FE) MG EC tablet Take 325 mg by mouth 3 (three) times a week.   gabapentin  300 MG capsule Commonly known as: NEURONTIN  Take 300 mg by mouth every 6 (six) hours.  lamoTRIgine  200 MG tablet Commonly known as: LAMICTAL  Take 200 mg by mouth 2 (two) times daily.   levocetirizine 5 MG tablet Commonly known as: XYZAL Take 5 mg by mouth every evening.   MAGNESIUM 27 PO Take 1 tablet by mouth at bedtime.   meloxicam 15 MG tablet Commonly known as: MOBIC Take 15 mg by mouth daily.   metoprolol  succinate 50 MG 24 hr tablet Commonly known as: Toprol  XL Take 1 tablet (50 mg total) by mouth daily. Take with or immediately following a meal. What changed: additional instructions   nitroGLYCERIN  0.4 MG SL tablet Commonly known as: Nitrostat  Place 1 tablet (0.4 mg total) under the tongue every 5 (five) minutes as needed for chest pain. If you require more than two tablets five minutes apart go to the nearest ER via EMS.   ondansetron  8 MG tablet Commonly known as: Zofran  Take 1 tablet (8 mg total) by mouth every 8 (eight) hours as needed for nausea or vomiting. Start on  the third day after cisplatin. What changed: Another medication with the same name was removed. Continue taking this medication, and follow the directions you see here.   oxyCODONE  5 MG immediate release tablet Commonly known as: Oxy IR/ROXICODONE  Take 1 tablet (5 mg total) by mouth every 6 (six) hours as needed for moderate pain (pain score 4-6).   pantoprazole  40 MG tablet Commonly known as: PROTONIX  Take 1 tablet (40 mg total) by mouth daily. Start taking on: August 27, 2024   polyethylene glycol 17 g packet Commonly known as: MIRALAX  / GLYCOLAX  Take 17 g by mouth daily as needed for mild constipation.   prochlorperazine 10 MG tablet Commonly known as: COMPAZINE Take 1 tablet (10 mg total) by mouth every 6 (six) hours as needed for nausea or vomiting.   QUEtiapine  100 MG tablet Commonly known as: SEROQUEL  Take 100 mg by mouth at bedtime.   rosuvastatin  20 MG tablet Commonly known as: CRESTOR  Take 20 mg by mouth at bedtime.   senna-docusate 8.6-50 MG tablet Commonly known as: Senokot-S Take 1 tablet by mouth 2 (two) times daily.   tiZANidine  4 MG tablet Commonly known as: ZANAFLEX  Take 4 mg by mouth every 8 (eight) hours as needed for muscle spasms. What changed: Another medication with the same name was removed. Continue taking this medication, and follow the directions you see here.   Vitamin D3 50 MCG (2000 UT) capsule Take 4,000 Units by mouth daily.   zaleplon 5 MG capsule Commonly known as: SONATA Take 5 mg by mouth at bedtime.        Follow-up Information     [name], [name]. Schedule an appointment as soon as possible for a visit in 1 week(s).   Specialty: Physician Assistant Contact information: [address] [place] [address] [phone]         Autumn Millman, MD Follow up.   Specialty: Oncology Contact information: [address] [place] [address] [phone]                Allergies  Allergen  Reactions   Trazodone Nausea Only and Other (See Comments)    Dizziness, headaches - also   Penicillins Hives and Rash   Morphine  Sulfate Hives, Itching and Nausea And Vomiting   Sm Baby Powder Cornstarch [Powders] Itching and Other (See Comments)    Patient is allergic to the powder in latex gloves- NOT the latex    Consultations: Medical oncology, Dr. Doristine interventional radiologysam  Procedures/Studies: CT ABDOMEN PELVIS W CONTRAST Result Date: 08/21/2024 EXAM: CT ABDOMEN AND PELVIS WITH CONTRAST 08/21/2024 01:01:53 AM TECHNIQUE: CT of the abdomen and pelvis was performed with the administration of 100 mL of iohexol  (OMNIPAQUE ) 300 MG/ML solution. Multiplanar reformatted images are provided for review. Automated exposure control, iterative reconstruction, and/or weight-based adjustment of the mA/kV was utilized to reduce the radiation dose to as low as reasonably achievable. COMPARISON: 04/19/2024, PET CT 08/08/24 CLINICAL HISTORY: Abdominal pain, acute, nonlocalized; RUQ, epigastric TTP, hx pancreatic cancer with mets to liver, starting chemo soon. FINDINGS: LOWER CHEST: No acute abnormality. LIVER: Numerous low density masses throughout the liver compatible with metastases. Largest mass centrally in the liver measures up to 4.1 cm. GALLBLADDER AND BILE DUCTS: Gallbladder is unremarkable. No biliary ductal dilatation. SPLEEN: No acute abnormality. PANCREAS: Low density ill defined mass in the pancreatic body measures 2.9 cm. This is compatible with the patient's known pancreatic cancer . ADRENAL GLANDS: No acute abnormality. KIDNEYS, URETERS AND BLADDER: No stones in the kidneys or ureters. No hydronephrosis. No perinephric or periureteral stranding. Urinary bladder is unremarkable. GI AND BOWEL: Stomach demonstrates no acute abnormality. There is no bowel obstruction. PERITONEUM AND RETROPERITONEUM: No ascites. No free air. VASCULATURE: Aorta is normal in caliber. LYMPH NODES: No  lymphadenopathy. REPRODUCTIVE ORGANS: No acute abnormality. BONES AND SOFT TISSUES: No acute osseous abnormality. No focal soft tissue abnormality. IMPRESSION: 1. Numerous hepatic metastases, largest 4.1 cm; progressive metastatic disease. 2. Pancreatic body mass 2.9 cm corresponding to known pancreatic cancer . Electronically signed by: Franky Crease MD 08/21/2024 01:29 AM EST RP Workstation: [id number]   NM PET Image Initial (PI) Skull Base To Thigh Result Date: 08/11/2024 EXAM: PET AND CT SKULL BASE TO MID THIGH 08/08/2024 95:73:75 PM TECHNIQUE: RADIOPHARMACEUTICAL: 10.70 mCi F-18 FDG Uptake time 60 minutes. Glucose level 108 mg/dl. PET imaging was acquired from the base of the skull to the mid thighs. Non-contrast enhanced computed tomography was obtained for attenuation correction and anatomic localization. COMPARISON: MRI dated 04/19/2024. CLINICAL HISTORY: Poorly differentiated carcinoma noted on liver biopsy, neuroendocrine features. Needs staging. Table formatting from the original note was not included. 10.70 mCi F-18 FDG @ 1514 IV in RT HAND//BWM-KC. FBG: 108 mg/dL. 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 Needs staging. FINDINGS: HEAD AND NECK: No metabolically active cervical lymphadenopathy. CHEST: No metabolically active pulmonary nodules. No lung metastasis. No metabolically active lymphadenopathy. ABDOMEN AND PELVIS: There are multiple intense metabolic activity within the liver, dominant lesion in the central left hepatic lobe with SUV max equal to 10.1. This lesion is subtly evident on noncontrast CT measuring 18 mm on image 91. Additional lesion in the posterior left hepatic lobe on image 103. Additional lesion lateral segment of the left hepatic lobe on imaging 108. The lesions appear progressed from MRI April 19, 2024. Within the central body of the pancreas, there is an intense focus of metabolic activity with SUV max equal to  9.3 image 108. This corresponds to focal enlargement of the mid body of the pancreas to 4.3 x 2.8 cm on image 109. There is interval resolution of the distal pancreatitis seen on comparison CT and MRI. No hypermetabolic abdominal lymph nodes are identified. The uterus and ovaries are normal. Physiologic activity within the gastrointestinal and genitourinary systems. BONES AND SOFT TISSUE: No abnormal FDG activity localizes to the bones. No metabolically active aggressive osseous lesion. IMPRESSION: 1. Hepatic lesions with intense metabolic activity, progressed from April 19, 2024 MRI, consistent with  metastatic disease. 2. Hypermetabolic mass in the pancreatic body measuring approximately 4.3 x 2.8 cm. Presumed primary malignancy . 3. No hypermetabolic abdominal lymph nodes. 4. No skeletal metastasis. 5. No lung metastasis. 6. Interval resolution of distal pancreatitis. Electronically signed by: Norleen Boxer MD 08/11/2024 09:30 AM EDT RP Workstation: [id number]     Subjective: Patient seen examined bedside, lying in bed.  No specific complaints this morning.  Had 2 bowel movements yesterday.  Completed first round of chemotherapy yesterday.  Tolerated well.  Unfortunately unable to get Port-A-Cath placed while inpatient due to IR scheduled with plan outpatient placement.  No other questions or concerns at this time.  Denies headache, no dizziness, no chest pain, no palpitations, no shortness of breath, no fever/chills/night sweats, no nausea/vomiting/diarrhea, no focal weakness, no fatigue, no paresthesias.  No acute events overnight per nursing staff.  Discharging home.  Discharge Exam: Vitals:   08/26/24 0511 08/26/24 0908  BP: 118/63 138/85  Pulse: 72 75  Resp: 16   Temp: 98.2 F (36.8 C)   SpO2: 95%    Vitals:   08/25/24 1410 08/25/24 2100 08/26/24 0511 08/26/24 0908  BP: 114/63 129/69 118/63 138/85  Pulse: 79 73 72 75  Resp: 17 20 16    Temp: 98.4 F (36.9 C) 99 F (37.2 C) 98.2 F (36.8  C)   TempSrc: Oral Oral Oral   SpO2: 95% 99% 95%   Weight:      Height:        Physical Exam: GEN: NAD, alert and oriented x 3, obese HEENT: NCAT, PERRL, EOMI, sclera clear, MMM PULM: CTAB w/o wheezes/crackles, normal respiratory effort on room air CV: RRR w/o M/G/R GI: abd soft, NTND, + BS MSK: no peripheral edema, moves all extremity dependently NEURO: CN II-XII intact, no focal deficits, sensation to light touch intact PSYCH: normal mood/affect Integumentary: dry/intact, no rashes or wounds    The results of significant diagnostics from this hospitalization (including imaging, microbiology, ancillary and laboratory) are listed below for reference.     Microbiology: Recent Results (from the past 240 hours)  Resp panel by RT-PCR (RSV, Flu A&B, Covid) Anterior Nasal Swab     Status: None   Collection Time: 08/20/24 11:12 PM   Specimen: Anterior Nasal Swab  Result Value Ref Range Status   SARS Coronavirus 2 by RT PCR NEGATIVE NEGATIVE Final    Comment: (NOTE) SARS-CoV-2 target nucleic acids are NOT DETECTED.  The SARS-CoV-2 RNA is generally detectable in upper respiratory specimens during the acute phase of infection. The lowest concentration of SARS-CoV-2 viral copies this assay can detect is 138 copies/mL. A negative result does not preclude SARS-Cov-2 infection and should not be used as the sole basis for treatment or other patient management decisions. A negative result may occur with  improper specimen collection/handling, submission of specimen other than nasopharyngeal swab, presence of viral mutation(s) within the areas targeted by this assay, and inadequate number of viral copies(<138 copies/mL). A negative result must be combined with clinical observations, patient history, and epidemiological information. The expected result is Negative.  Fact Sheet for Patients:  bloggercourse.com  Fact Sheet for Healthcare Providers:   seriousbroker.it  This test is no t yet approved or cleared by the [place]  FDA and  has been authorized for detection and/or diagnosis of SARS-CoV-2 by FDA under an Emergency Use Authorization (EUA). This EUA will remain  in effect (meaning this test can be used) for the duration of the COVID-19 declaration under Section 564(b)(1) of the  Act, 21 U.S.C.section 360bbb-3(b)(1), unless the authorization is terminated  or revoked sooner.       Influenza A by PCR NEGATIVE NEGATIVE Final   Influenza B by PCR NEGATIVE NEGATIVE Final    Comment: (NOTE) The Xpert Xpress SARS-CoV-2/FLU/RSV plus assay is intended as an aid in the diagnosis of influenza from Nasopharyngeal swab specimens and should not be used as a sole basis for treatment. Nasal washings and aspirates are unacceptable for Xpert Xpress SARS-CoV-2/FLU/RSV testing.  Fact Sheet for Patients: bloggercourse.com  Fact Sheet for Healthcare Providers: seriousbroker.it  This test is not yet approved or cleared by the [place]  FDA and has been authorized for detection and/or diagnosis of SARS-CoV-2 by FDA under an Emergency Use Authorization (EUA). This EUA will remain in effect (meaning this test can be used) for the duration of the COVID-19 declaration under Section 564(b)(1) of the Act, 21 U.S.C. section 360bbb-3(b)(1), unless the authorization is terminated or revoked.     Resp Syncytial Virus by PCR NEGATIVE NEGATIVE Final    Comment: (NOTE) Fact Sheet for Patients: bloggercourse.com  Fact Sheet for Healthcare Providers: seriousbroker.it  This test is not yet approved or cleared by the [place]  FDA and has been authorized for detection and/or diagnosis of SARS-CoV-2 by FDA under an Emergency Use Authorization (EUA). This EUA will remain in effect (meaning this test can be used) for  the duration of the COVID-19 declaration under Section 564(b)(1) of the Act, 21 U.S.C. section 360bbb-3(b)(1), unless the authorization is terminated or revoked.  Performed at [facility], [address], [place], [place] [address]      Labs: BNP (last 3 results) No results for input(s): BNP in the last 8760 hours. Basic Metabolic Panel: Recent Labs  Lab 08/22/24 0608 08/23/24 0616 08/24/24 0539 08/25/24 0717 08/26/24 0717  NA 140 140 138 137 138  K 3.5 3.8 4.2 4.1 4.0  CL 101 101 103 104 102  CO2 30 30 27 27 26   GLUCOSE 107* 109* 145* 128* 102*  BUN 5* <5* 9 11 11   CREATININE 0.53 0.61 0.55 0.60 0.64  CALCIUM  9.4 9.6 9.2 9.2 9.4  MG  --  1.9 2.2 2.2 2.3   Liver Function Tests: Recent Labs  Lab 08/22/24 0608 08/23/24 0616 08/24/24 0539 08/25/24 0717 08/26/24 0717  AST 38 38 54* 66* 79*  ALT 43 38 47* 58* 68*  ALKPHOS 336* 335* 320* 304* 304*  BILITOT 0.4 0.4 0.3 0.4 0.3  PROT 6.3* 6.5 6.1* 6.0* 5.9*  ALBUMIN 3.4* 3.5 3.2* 3.2* 3.2*   Recent Labs  Lab 08/20/24 1921 08/22/24 0608 08/23/24 0616 08/24/24 0539 08/25/24 0717  LIPASE 607* 534* 484* 487* 464*   No results for input(s): AMMONIA in the last 168 hours. CBC: Recent Labs  Lab 08/22/24 0608 08/23/24 0616 08/24/24 0539 08/25/24 0717 08/26/24 0717  WBC 4.8 4.9 5.1 5.7 5.0  NEUTROABS  --  2.6 3.6 4.0 3.0  HGB 12.0 12.2 11.6* 11.4* 11.3*  HCT 40.0 40.2 39.9 39.0 38.1  MCV 88.3 87.4 87.5 88.4 90.7  PLT 196 187 198 192 183   Cardiac Enzymes: No results for input(s): CKTOTAL, CKMB, CKMBINDEX, TROPONINI in the last 168 hours. BNP: Invalid input(s): POCBNP CBG: No results for input(s): GLUCAP in the last 168 hours. D-Dimer No results for input(s): DDIMER in the last 72 hours. Hgb A1c No results for input(s): HGBA1C in the last 72 hours. Lipid Profile No results for input(s): CHOL, HDL, LDLCALC, TRIG, CHOLHDL, LDLDIRECT in  the last 72  hours. Thyroid  function studies No results for input(s): TSH, T4TOTAL, T3FREE, THYROIDAB in the last 72 hours.  Invalid input(s): FREET3 Anemia work up No results for input(s): VITAMINB12, FOLATE, FERRITIN, TIBC, IRON, RETICCTPCT in the last 72 hours. Urinalysis    Component Value Date/Time   COLORURINE YELLOW 08/21/2024 0630   APPEARANCEUR CLEAR 08/21/2024 0630   LABSPEC 1.015 08/21/2024 0630   PHURINE 5.0 08/21/2024 0630   GLUCOSEU NEGATIVE 08/21/2024 0630   HGBUR NEGATIVE 08/21/2024 0630   BILIRUBINUR NEGATIVE 08/21/2024 0630   KETONESUR NEGATIVE 08/21/2024 0630   PROTEINUR NEGATIVE 08/21/2024 0630   UROBILINOGEN 0.2 05/06/2014 1553   NITRITE NEGATIVE 08/21/2024 0630   LEUKOCYTESUR NEGATIVE 08/21/2024 0630   Sepsis Labs Recent Labs  Lab 08/23/24 0616 08/24/24 0539 08/25/24 0717 08/26/24 0717  WBC 4.9 5.1 5.7 5.0   Microbiology Recent Results (from the past 240 hours)  Resp panel by RT-PCR (RSV, Flu A&B, Covid) Anterior Nasal Swab     Status: None   Collection Time: 08/20/24 11:12 PM   Specimen: Anterior Nasal Swab  Result Value Ref Range Status   SARS Coronavirus 2 by RT PCR NEGATIVE NEGATIVE Final    Comment: (NOTE) SARS-CoV-2 target nucleic acids are NOT DETECTED.  The SARS-CoV-2 RNA is generally detectable in upper respiratory specimens during the acute phase of infection. The lowest concentration of SARS-CoV-2 viral copies this assay can detect is 138 copies/mL. A negative result does not preclude SARS-Cov-2 infection and should not be used as the sole basis for treatment or other patient management decisions. A negative result may occur with  improper specimen collection/handling, submission of specimen other than nasopharyngeal swab, presence of viral mutation(s) within the areas targeted by this assay, and inadequate number of viral copies(<138 copies/mL). A negative result must be combined with clinical observations, patient  history, and epidemiological information. The expected result is Negative.  Fact Sheet for Patients:  bloggercourse.com  Fact Sheet for Healthcare Providers:  seriousbroker.it  This test is no t yet approved or cleared by the [place]  FDA and  has been authorized for detection and/or diagnosis of SARS-CoV-2 by FDA under an Emergency Use Authorization (EUA). This EUA will remain  in effect (meaning this test can be used) for the duration of the COVID-19 declaration under Section 564(b)(1) of the Act, 21 U.S.C.section 360bbb-3(b)(1), unless the authorization is terminated  or revoked sooner.       Influenza A by PCR NEGATIVE NEGATIVE Final   Influenza B by PCR NEGATIVE NEGATIVE Final    Comment: (NOTE) The Xpert Xpress SARS-CoV-2/FLU/RSV plus assay is intended as an aid in the diagnosis of influenza from Nasopharyngeal swab specimens and should not be used as a sole basis for treatment. Nasal washings and aspirates are unacceptable for Xpert Xpress SARS-CoV-2/FLU/RSV testing.  Fact Sheet for Patients: bloggercourse.com  Fact Sheet for Healthcare Providers: seriousbroker.it  This test is not yet approved or cleared by the [place]  FDA and has been authorized for detection and/or diagnosis of SARS-CoV-2 by FDA under an Emergency Use Authorization (EUA). This EUA will remain in effect (meaning this test can be used) for the duration of the COVID-19 declaration under Section 564(b)(1) of the Act, 21 U.S.C. section 360bbb-3(b)(1), unless the authorization is terminated or revoked.     Resp Syncytial Virus by PCR NEGATIVE NEGATIVE Final    Comment: (NOTE) Fact Sheet for Patients: bloggercourse.com  Fact Sheet for Healthcare Providers: seriousbroker.it  This test is not yet approved or cleared by  the [place]  FDA  and has been authorized for detection and/or diagnosis of SARS-CoV-2 by FDA under an Emergency Use Authorization (EUA). This EUA will remain in effect (meaning this test can be used) for the duration of the COVID-19 declaration under Section 564(b)(1) of the Act, 21 U.S.C. section 360bbb-3(b)(1), unless the authorization is terminated or revoked.  Performed at [facility], [address], [place], [place] [address]      Time coordinating discharge: Over 30 minutes  SIGNED:   Camellia PARAS Demyan Fugate, DO  [facility] 08/26/2024, 9:12 AM

## 2024-08-27 ENCOUNTER — Other Ambulatory Visit: Payer: Self-pay | Admitting: Oncology

## 2024-08-27 ENCOUNTER — Ambulatory Visit (HOSPITAL_COMMUNITY)
Admission: RE | Admit: 2024-08-27 | Discharge: 2024-08-27 | Disposition: A | Discharge status: Home / Self Care | Source: Ambulatory Visit | Attending: Oncology | Admitting: Oncology

## 2024-08-27 ENCOUNTER — Encounter (HOSPITAL_COMMUNITY): Payer: Self-pay

## 2024-08-27 ENCOUNTER — Other Ambulatory Visit: Payer: Self-pay

## 2024-08-27 ENCOUNTER — Ambulatory Visit (HOSPITAL_COMMUNITY)
Admission: RE | Admit: 2024-08-27 | Discharge: 2024-08-27 | Disposition: A | Source: Ambulatory Visit | Attending: Oncology

## 2024-08-27 ENCOUNTER — Encounter: Payer: Self-pay | Admitting: Oncology

## 2024-08-27 ENCOUNTER — Telehealth: Payer: Self-pay

## 2024-08-27 DIAGNOSIS — C787 Secondary malignant neoplasm of liver and intrahepatic bile duct: Secondary | ICD-10-CM | POA: Insufficient documentation

## 2024-08-27 DIAGNOSIS — K219 Gastro-esophageal reflux disease without esophagitis: Secondary | ICD-10-CM | POA: Insufficient documentation

## 2024-08-27 DIAGNOSIS — I10 Essential (primary) hypertension: Secondary | ICD-10-CM | POA: Insufficient documentation

## 2024-08-27 DIAGNOSIS — C7A8 Other malignant neuroendocrine tumors: Secondary | ICD-10-CM | POA: Insufficient documentation

## 2024-08-27 HISTORY — PX: IR IMAGING GUIDED PORT INSERTION: IMG5740

## 2024-08-27 HISTORY — DX: Malignant (primary) neoplasm, unspecified: C80.1

## 2024-08-27 MED ORDER — HEPARIN SOD (PORK) LOCK FLUSH 100 UNIT/ML IV SOLN
INTRAVENOUS | Status: AC
  Filled 2024-08-27: qty 5

## 2024-08-27 MED ORDER — MIDAZOLAM HCL (PF) 2 MG/2ML IJ SOLN
INTRAMUSCULAR | Status: AC | PRN
  Administered 2024-08-27: 1 mg via INTRAVENOUS
  Administered 2024-08-27: .5 mg via INTRAVENOUS

## 2024-08-27 MED ORDER — LIDOCAINE HCL 1 % IJ SOLN
INTRAMUSCULAR | Status: AC
  Filled 2024-08-27: qty 20

## 2024-08-27 MED ORDER — SODIUM CHLORIDE 0.9 % IV SOLN: INTRAVENOUS | Status: DC

## 2024-08-27 MED ORDER — FENTANYL CITRATE (PF) 100 MCG/2ML IJ SOLN
INTRAMUSCULAR | Status: AC
  Filled 2024-08-27: qty 2

## 2024-08-27 MED ORDER — FENTANYL CITRATE (PF) 100 MCG/2ML IJ SOLN
INTRAMUSCULAR | Status: AC | PRN
  Administered 2024-08-27: 25 ug via INTRAVENOUS
  Administered 2024-08-27: 50 ug via INTRAVENOUS

## 2024-08-27 MED ORDER — MIDAZOLAM HCL 2 MG/2ML IJ SOLN
INTRAMUSCULAR | Status: AC
  Filled 2024-08-27: qty 2

## 2024-08-27 MED ORDER — LIDOCAINE HCL 1 % IJ SOLN
20.0000 mL | Freq: Once | INTRAMUSCULAR | Status: AC
  Administered 2024-08-27: 20 mL via INTRADERMAL

## 2024-08-27 MED ORDER — HEPARIN SOD (PORK) LOCK FLUSH 100 UNIT/ML IV SOLN
500.0000 [IU] | Freq: Once | INTRAVENOUS | Status: AC
  Administered 2024-08-27: 500 [IU] via INTRAVENOUS

## 2024-08-27 NOTE — Discharge Instructions (Signed)
 Please call Interventional Radiology clinic [phone] with any questions or concerns.  You may remove your dressing and shower tomorrow.  After the procedure, it is common to have: Discomfort at the port insertion site. Bruising on the skin over the port. This should improve over 3-4 days  Follow these instructions at home:  Medication: Do not use Aspirin or ibuprofen products, such as Advil or Motrin, as it may increase bleeding.  You may resume your usual medications as ordered by your doctor. If your doctor prescribed antibiotics, take them as directed. Do not stop taking them just because you feel better. You need to take the full course of antibiotics.  Eating and drinking: Drink plenty of liquids to keep your urine pale yellow You can resume your regular diet as directed by your doctor   Care of the procedure site Follow instructions from your health care provider about how to take care of your port insertion site. Make sure you: After your port is placed, you will get a manufacturer's information card. The card has information about your port. Keep this card with you at all times Make sure to remember what type of port you have Take care of the port as told by your health care provider DO NOT use EMLA cream for 2 weeks after port placement -the cream will remove the surgical glue on your incision DO NOT use any lotions, creams, or ointments on incision for 2 weeks. This will remove the surgical glue on your incision Wash your hands with soap and water before and after you change your bandage (dressing). If soap and water are not available, use hand sanitizer Change your dressing as told by your health care provider Leave skin glue, or adhesive strips in place. These skin closures may need to stay in place for 2 weeks or longer Check your port insertion site every day for signs of infection. Check for: Redness, swelling, or pain Fluid or blood Warmth Pus or a bad  smell  Activity Return to your normal activities as told by your health care provider. Ask your health care provider what activities are safe for you Do not lift anything that is heavier than 10 lb (4.5 kg), or the limit that you are told, until your health care provider says that it is safe Do not take baths, swim, or use a hot tub until your health care provider approves. Take showers only. Keep all follow-up visits as told by your doctor  Contact a health care provider if: You cannot flush your port with saline as directed, or you cannot draw blood from the port You have a fever or chills You have redness, swelling, or pain around your port insertion site You have fluid or blood coming from your port insertion site Your port insertion site feels warm to the touch You have pus or a bad smell coming from the port insertion site  Get help right away if: You have chest pain or shortness of breath You have bleeding from your port that you cannot control  Moderate Conscious Sedation-Care After  This sheet gives you information about how to care for yourself after your procedure. Your health care provider may also give you more specific instructions. If you have problems or questions, contact your health care provider.  After the procedure, it is common to have: Sleepiness for several hours. Impaired judgment for several hours. Difficulty with balance. Vomiting if you eat too soon.  Follow these instructions at home:  Rest. Do not  participate in activities where you could fall or become injured. Do not drive or use machinery. Do not drink alcohol. Do not take sleeping pills or medicines that cause drowsiness. Do not make important decisions or sign legal documents. Do not take care of children on your own.  Eating and drinking Follow the diet recommended by your health care provider. Drink enough fluid to keep your urine pale yellow. If you vomit: Drink water, juice, or soup  when you can drink without vomiting. Make sure you have little or no nausea before eating solid foods.  General instructions Take over-the-counter and prescription medicines only as told by your health care provider. Have a responsible adult stay with you for the time you are told. It is important to have someone help care for you until you are awake and alert. Do not smoke. Keep all follow-up visits as told by your health care provider. This is important.  Contact a health care provider if: You are still sleepy or having trouble with balance after 24 hours. You feel light-headed. You keep feeling nauseous or you keep vomiting. You develop a rash. You have a fever. You have redness or swelling around the IV site.  Get help right away if: You have trouble breathing. You have new-onset confusion at home.  This information is not intended to replace advice given to you by your health care provider. Make sure you discuss any questions you have with your healthcare provider.

## 2024-08-27 NOTE — Transitions of Care (Post Inpatient/ED Visit) (Signed)
   08/27/2024  Name: Jo Allen MRN: [id number] DOB: 1966/09/04  Today's TOC FU Call Status: Today's TOC FU Call Status:: Unsuccessful Call (1st Attempt) Unsuccessful Call (1st Attempt) Date: 08/27/24  Attempted to reach the patient regarding the most recent Inpatient/ED visit.  Follow Up Plan: Additional outreach attempts will be made to reach the patient to complete the Transitions of Care (Post Inpatient/ED visit) call.   Dynastie Knoop J. Halston Kintz RN, MSN [facility], [facility] RN Care Manager Direct Dial: [phone]  Fax: [phone] Website: [email or web address]

## 2024-08-27 NOTE — Procedures (Signed)
 Interventional Radiology Procedure Note  Procedure: Single Lumen Power Port Placement    Access:  Right IJ vein.  Findings: Catheter tip positioned at SVC/RA junction. Port is ready for immediate use.   Complications: None  EBL: < 10 mL  Recommendations:  - Ok to shower in 24 hours - Do not submerge for 7 days - Routine line care   Maxi Carreras T. Fredia Sorrow, M.D Pager:  [phone]

## 2024-08-27 NOTE — H&P (Signed)
 Chief Complaint: Patient was seen in consultation today for pancreatic neuroendocrine cancer  Referring Physician(s): Pasam,Avinash  Supervising Physician: Luverne Aran  Patient Status: [facility] - Out-pt  History of Present Illness: Jo Allen is a 58 y.o. female with past medical history significant for recent diagnosis of pancreatic neuroendocrine cancer with hepatic metastasis.  She has plans to initiate chemotherapy and is in need of durable venous access.  She is referred to [facility] for Port-A-Cath placement at the request of Dr. Autumn.   Patient assessed at bedside.  She is aware of the goals of the procedure as well as risks and benefits. She is agreeable to proceed. Her daugther-in-law is available for post procedure care and transportation.   She was recently admitted for acute pancreatitis.  Currently denies fever, chills, new or uncontrolled abdominal pain, nausea, or vomiting.   She is FULL CODE.   Past Medical History:  Diagnosis Date   Acute pancreatitis 04/19/2024   Asthma    CAD (coronary artery disease)    Cancer (HCC)    GERD (gastroesophageal reflux disease)    Headache    Heart disease    Hypertension    Obesity    Paresthesia 08/20/2015   Prediabetes    Transfusion history    88yrs ago    Past Surgical History:  Procedure Laterality Date   ANGIOPLASTY     BREAST BIOPSY Left    BREAST BIOPSY WITH SENTINEL LYMPH NODE BIOPSY AND NEEDLE LOCALIZATION Left    COLONOSCOPY WITH PROPOFOL  N/A 02/19/2014   Procedure: COLONOSCOPY WITH PROPOFOL ;  Surgeon: Gladis MARLA Louder, MD;  Location: [facility] ENDOSCOPY;  Service: Endoscopy;  Laterality: N/A;   ESOPHAGOGASTRODUODENOSCOPY N/A 07/04/2024   Procedure: EGD (ESOPHAGOGASTRODUODENOSCOPY);  Surgeon: Burnette Fallow, MD;  Location: [facility];  Service: Gastroenterology;  Laterality: N/A;   EUS N/A 07/04/2024   Procedure: ULTRASOUND, UPPER GI TRACT, ENDOSCOPIC;  Surgeon: Burnette Fallow, MD;  Location: [facility]  ENDOSCOPY;  Service: Gastroenterology;  Laterality: N/A;   FINE NEEDLE ASPIRATION  07/04/2024   Procedure: FINE NEEDLE ASPIRATION;  Surgeon: Burnette Fallow, MD;  Location: [facility] ENDOSCOPY;  Service: Gastroenterology;;   JOINT REPLACEMENT Bilateral    LEFT HEART CATH AND CORONARY ANGIOGRAPHY N/A 04/29/2023   Procedure: LEFT HEART CATH AND CORONARY ANGIOGRAPHY;  Surgeon: Ladona Heinz, MD;  Location: [facility] INVASIVE CV LAB;  Service: Cardiovascular;  Laterality: N/A;   TOTAL KNEE ARTHROPLASTY Bilateral     Allergies: Trazodone, Penicillins, Morphine  sulfate, and Sm baby powder cornstarch [powders]  Medications: Prior to Admission medications   Medication Sig Start Date End Date Taking? Authorizing Provider  albuterol  (VENTOLIN  HFA) 108 (90 Base) MCG/ACT inhaler Inhale 1-2 puffs into the lungs every 6 (six) hours as needed for wheezing or shortness of breath. 01/03/22  Yes Billy Asberry FALCON, PA-C  ALEVE  220 MG tablet Take 220-440 mg by mouth 2 (two) times daily as needed (for pain or headaches).   Yes [provider]  amLODipine  (NORVASC ) 10 MG tablet Take 1 tablet (10 mg total) by mouth daily. 04/29/23  Yes Ladona Heinz, MD  Biotin  1000 MCG CHEW Chew 2,000 mcg by mouth daily.   Yes [provider]  buPROPion  (WELLBUTRIN  XL) 150 MG 24 hr tablet Take 150 mg by mouth daily. 01/24/24  Yes [provider]  Cholecalciferol (VITAMIN D3) 50 MCG (2000 UT) capsule Take 4,000 Units by mouth daily.   Yes [provider]  CRANBERRY PO Take 1 tablet by mouth daily.   Yes [provider]  gabapentin  (NEURONTIN ) 300 MG capsule Take 300 mg by mouth every 6 (six) hours.   Yes [provider]  lamoTRIgine  (LAMICTAL ) 200 MG tablet Take 200 mg by mouth 2 (two) times daily.   Yes [provider]  levocetirizine (XYZAL) 5 MG tablet Take 5 mg by mouth every evening.   Yes [provider]  lipase/protease/amylase (CREON ) 36000 UNITS CPEP capsule Take 1 capsule  (36,000 Units total) by mouth 3 (three) times daily with meals. 04/27/24  Yes Danford, Lonni SQUIBB, MD  Magnesium Gluconate (MAGNESIUM 27 PO) Take 1 tablet by mouth at bedtime.   Yes [provider]  meloxicam (MOBIC) 15 MG tablet Take 15 mg by mouth daily.   Yes [provider]  metoprolol  succinate (TOPROL  XL) 50 MG 24 hr tablet Take 1 tablet (50 mg total) by mouth daily. Take with or immediately following a meal. 08/26/24 11/24/24 Yes [place], Camellia PARAS, DO  oxyCODONE  (OXY IR/ROXICODONE ) 5 MG immediate release tablet Take 1 tablet (5 mg total) by mouth every 6 (six) hours as needed for moderate pain (pain score 4-6). 08/26/24  Yes [place], Camellia PARAS, DO  pantoprazole  (PROTONIX ) 40 MG tablet Take 1 tablet (40 mg total) by mouth daily. 08/27/24 11/25/24 Yes [place], Eric J, DO  polyethylene glycol (MIRALAX  / GLYCOLAX ) 17 g packet Take 17 g by mouth daily as needed for mild constipation. 08/26/24 11/24/24 Yes [place], Eric J, DO  QUEtiapine  (SEROQUEL ) 100 MG tablet Take 100 mg by mouth at bedtime. 04/04/24  Yes [provider]  rosuvastatin  (CRESTOR ) 20 MG tablet Take 20 mg by mouth at bedtime. 08/02/24  Yes [provider]  senna-docusate (SENOKOT-S) 8.6-50 MG tablet Take 1 tablet by mouth 2 (two) times daily. 08/26/24 11/24/24 Yes [place], Eric J, DO  tiZANidine  (ZANAFLEX ) 4 MG tablet Take 4 mg by mouth every 8 (eight) hours as needed for muscle spasms.   Yes [provider]  vitamin B-12 (CYANOCOBALAMIN ) 1000 MCG tablet Take 1,000 mcg by mouth daily.   Yes [provider]  zaleplon (SONATA) 5 MG capsule Take 5 mg by mouth at bedtime. 04/04/24  Yes [provider]  dexamethasone  (DECADRON ) 4 MG tablet Take for 1 day starting the day after chemotherapy on day 4. Take with food. Patient not taking: Reported on 08/23/2024 08/15/24   Pasam, Chinita, MD  ferrous sulfate  325 (65 FE) MG EC tablet Take 325 mg by mouth 3 (three) times a week.    [provider]  nitroGLYCERIN  (NITROSTAT ) 0.4 MG SL tablet Place 1 tablet (0.4 mg total) under the tongue every 5 (five) minutes as needed for chest pain. If you require more than two tablets five minutes apart go to the nearest ER via EMS. 08/26/24 09/25/24  [place], Camellia PARAS, DO  ondansetron  (ZOFRAN ) 8 MG tablet Take 1 tablet (8 mg total) by mouth every 8 (eight) hours as needed for nausea or vomiting. Start on the third day after cisplatin. 08/26/24   [place], Camellia PARAS, DO  prochlorperazine (COMPAZINE) 10 MG tablet Take 1 tablet (10 mg total) by mouth every 6 (six) hours as needed for nausea or vomiting. 08/15/24   Autumn Chinita, MD     Family History  Problem Relation Age of Onset   Hypertension Mother    Diabetes Mother    Stroke Father    Headache Sister    Depression Brother    Breast cancer Neg Hx     Social History   Socioeconomic History   Marital  status: Single    Spouse name: Not on file   Number of children: Not on file   Years of education: Not on file   Highest education level: Not on file  Occupational History   Not on file  Tobacco Use   Smoking status: Never    Passive exposure: Past   Smokeless tobacco: Never  Vaping Use   Vaping status: Never Used  Substance and Sexual Activity   Alcohol use: No   Drug use: No   Sexual activity: Not Currently  Other Topics Concern   Not on file  Social History Narrative   Not on file   Social Drivers of Health   Financial Resource Strain: Low Risk  (12/19/2023)   Received from [facility]   Overall Financial Resource Strain (CARDIA)    Difficulty of Paying Living Expenses: Not hard at all  Food Insecurity: No Food Insecurity (08/21/2024)   Hunger Vital Sign    Worried About Running Out of Food in the Last Year: Never true    Ran Out of Food in the Last Year: Never true  Transportation Needs: No Transportation Needs (08/21/2024)   PRAPARE - Administrator, Civil Service (Medical): No    Lack of  Transportation (Non-Medical): No  Physical Activity: Unknown (11/02/2022)   Received from [facility]   Exercise Vital Sign    On average, how many days per week do you engage in moderate to strenuous exercise (like a brisk walk)?: Patient declined    On average, how many minutes do you engage in exercise at this level?: 0 min  Stress: Stress Concern Present (11/02/2022)   Received from [facility] - Occupational Stress Questionnaire    Feeling of Stress : To some extent  Social Connections: Socially Integrated (11/02/2022)   Received [facility]   Social Network    How would you rate your social network (family, work, friends)?: Good participation with social networks     Review of Systems: A 12 point ROS discussed and pertinent positives are indicated in the HPI above.  All other systems are negative.  Review of Systems  Constitutional:  Negative for fatigue and fever.  Respiratory:  Negative for cough and shortness of breath.   Cardiovascular:  Negative for chest pain.  Gastrointestinal:  Negative for abdominal pain, nausea and vomiting.  Musculoskeletal:  Negative for back pain.  Psychiatric/Behavioral:  Negative for behavioral problems and confusion.     Vital Signs: BP (!) 140/79   Pulse 87   Temp 98.7 F (37.1 C) (Oral)   Resp 16   Ht 5' 4 (1.626 m)   Wt 208 lb 8.9 oz (94.6 kg)   SpO2 97%   BMI 35.80 kg/m   Physical Exam Vitals and nursing note reviewed.  Constitutional:      General: She is not in acute distress.    Appearance: Normal appearance. She is not ill-appearing.  Cardiovascular:     Rate and Rhythm: Normal rate and regular rhythm.  Pulmonary:     Effort: Pulmonary effort is normal. No respiratory distress.  Abdominal:     General: Abdomen is flat. There is no distension.     Palpations: Abdomen is soft.  Skin:    General: Skin is warm and dry.  Neurological:     General: No focal deficit  present.     Mental Status: She is alert and oriented to person, place, and time. Mental status  is at baseline.  Psychiatric:        Mood and Affect: Mood normal.        Behavior: Behavior normal.        Thought Content: Thought content normal.        Judgment: Judgment normal.      MD Evaluation Airway: WNL Heart: WNL Abdomen: WNL Chest/ Lungs: WNL ASA  Classification: 3 Mallampati/Airway Score: Two   Imaging: CT ABDOMEN PELVIS W CONTRAST Result Date: 08/21/2024 EXAM: CT ABDOMEN AND PELVIS WITH CONTRAST 08/21/2024 01:01:53 AM TECHNIQUE: CT of the abdomen and pelvis was performed with the administration of 100 mL of iohexol  (OMNIPAQUE ) 300 MG/ML solution. Multiplanar reformatted images are provided for review. Automated exposure control, iterative reconstruction, and/or weight-based adjustment of the mA/kV was utilized to reduce the radiation dose to as low as reasonably achievable. COMPARISON: 04/19/2024, PET CT 08/08/24 CLINICAL HISTORY: Abdominal pain, acute, nonlocalized; RUQ, epigastric TTP, hx pancreatic cancer with mets to liver, starting chemo soon. FINDINGS: LOWER CHEST: No acute abnormality. LIVER: Numerous low density masses throughout the liver compatible with metastases. Largest mass centrally in the liver measures up to 4.1 cm. GALLBLADDER AND BILE DUCTS: Gallbladder is unremarkable. No biliary ductal dilatation. SPLEEN: No acute abnormality. PANCREAS: Low density ill defined mass in the pancreatic body measures 2.9 cm. This is compatible with the patient's known pancreatic cancer . ADRENAL GLANDS: No acute abnormality. KIDNEYS, URETERS AND BLADDER: No stones in the kidneys or ureters. No hydronephrosis. No perinephric or periureteral stranding. Urinary bladder is unremarkable. GI AND BOWEL: Stomach demonstrates no acute abnormality. There is no bowel obstruction. PERITONEUM AND RETROPERITONEUM: No ascites. No free air. VASCULATURE: Aorta is normal in caliber. LYMPH NODES: No  lymphadenopathy. REPRODUCTIVE ORGANS: No acute abnormality. BONES AND SOFT TISSUES: No acute osseous abnormality. No focal soft tissue abnormality. IMPRESSION: 1. Numerous hepatic metastases, largest 4.1 cm; progressive metastatic disease. 2. Pancreatic body mass 2.9 cm corresponding to known pancreatic cancer . Electronically signed by: Franky Crease MD 08/21/2024 01:29 AM EST RP Workstation: [id number]   NM PET Image Initial (PI) Skull Base To Thigh Result Date: 08/11/2024 EXAM: PET AND CT SKULL BASE TO MID THIGH 08/08/2024 95:73:75 PM TECHNIQUE: RADIOPHARMACEUTICAL: 10.70 mCi F-18 FDG Uptake time 60 minutes. Glucose level 108 mg/dl. PET imaging was acquired from the base of the skull to the mid thighs. Non-contrast enhanced computed tomography was obtained for attenuation correction and anatomic localization. COMPARISON: MRI dated 04/19/2024. CLINICAL HISTORY: Poorly differentiated carcinoma noted on liver biopsy, neuroendocrine features. Needs staging. Table formatting from the original note was not included. 10.70 mCi F-18 FDG @ 1514 IV in RT HAND//BWM-KC. FBG: 108 mg/dL. 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 Needs staging. FINDINGS: HEAD AND NECK: No metabolically active cervical lymphadenopathy. CHEST: No metabolically active pulmonary nodules. No lung metastasis. No metabolically active lymphadenopathy. ABDOMEN AND PELVIS: There are multiple intense metabolic activity within the liver, dominant lesion in the central left hepatic lobe with SUV max equal to 10.1. This lesion is subtly evident on noncontrast CT measuring 18 mm on image 91. Additional lesion in the posterior left hepatic lobe on image 103. Additional lesion lateral segment of the left hepatic lobe on imaging 108. The lesions appear progressed from MRI April 19, 2024. Within the central body of the pancreas, there is an intense focus of metabolic activity with SUV max equal to  9.3 image 108. This corresponds to focal enlargement of the mid body of the pancreas to 4.3 x  2.8 cm on image 109. There is interval resolution of the distal pancreatitis seen on comparison CT and MRI. No hypermetabolic abdominal lymph nodes are identified. The uterus and ovaries are normal. Physiologic activity within the gastrointestinal and genitourinary systems. BONES AND SOFT TISSUE: No abnormal FDG activity localizes to the bones. No metabolically active aggressive osseous lesion. IMPRESSION: 1. Hepatic lesions with intense metabolic activity, progressed from April 19, 2024 MRI, consistent with metastatic disease. 2. Hypermetabolic mass in the pancreatic body measuring approximately 4.3 x 2.8 cm. Presumed primary malignancy . 3. No hypermetabolic abdominal lymph nodes. 4. No skeletal metastasis. 5. No lung metastasis. 6. Interval resolution of distal pancreatitis. Electronically signed by: Norleen Boxer MD 08/11/2024 09:30 AM EDT RP Workstation: [id number]    Labs:  CBC: Recent Labs    08/23/24 0616 08/24/24 0539 08/25/24 0717 08/26/24 0717  WBC 4.9 5.1 5.7 5.0  HGB 12.2 11.6* 11.4* 11.3*  HCT 40.2 39.9 39.0 38.1  PLT 187 198 192 183    COAGS: No results for input(s): INR, APTT in the last 8760 hours.  BMP: Recent Labs    08/23/24 0616 08/24/24 0539 08/25/24 0717 08/26/24 0717  NA 140 138 137 138  K 3.8 4.2 4.1 4.0  CL 101 103 104 102  CO2 30 27 27 26   GLUCOSE 109* 145* 128* 102*  BUN <5* 9 11 11   CALCIUM  9.6 9.2 9.2 9.4  CREATININE 0.61 0.55 0.60 0.64  GFRNONAA >60 >60 >60 >60    LIVER FUNCTION TESTS: Recent Labs    08/23/24 0616 08/24/24 0539 08/25/24 0717 08/26/24 0717  BILITOT 0.4 0.3 0.4 0.3  AST 38 54* 66* 79*  ALT 38 47* 58* 68*  ALKPHOS 335* 320* 304* 304*  PROT 6.5 6.1* 6.0* 5.9*  ALBUMIN 3.5 3.2* 3.2* 3.2*    TUMOR MARKERS: Recent Labs    07/26/24 1227  CEA 5.47*    Assessment and Plan: Patient with past medical history of HTN, GERD,  recent pancreatitis presents with complaint of new diagnosis of pancreatic neuroendocrine cancer.  IR consulted for Port placement at the request of Dr. Autumn. Case reviewed by Dr. Luverne who approves patient for procedure.  Patient presents today in their usual state of health.  She has been NPO and is not currently on blood thinners.   Risks and benefits of image guided port-a-catheter placement was discussed with the patient including, but not limited to bleeding, infection, pneumothorax, or fibrin sheath development and need for additional procedures.  All of the patient's questions were answered, patient is agreeable to proceed. Consent signed and in chart.   Thank you for this interesting consult.  I greatly enjoyed meeting [employer] and look forward to participating in their care.  A copy of this report was sent to the requesting provider on this date.  Electronically Signed: Kenzlee Fishburn Sue-Ellen Loranzo Desha, PA 08/27/2024, 1:43 PM   I spent a total of  30 Minutes   in face to face in clinical consultation, greater than 50% of which was counseling/coordinating care for pancreatic neuroendocrine cancer.

## 2024-08-27 NOTE — Progress Notes (Signed)
 1600 Ice pack given to use as needed for comfort to right upper neck and chest as instructed.

## 2024-08-28 ENCOUNTER — Telehealth: Payer: Self-pay

## 2024-08-28 ENCOUNTER — Other Ambulatory Visit: Payer: Self-pay | Admitting: Internal Medicine

## 2024-08-28 MED ORDER — OXYCODONE-ACETAMINOPHEN 5-325 MG PO TABS: 1.0000 | ORAL_TABLET | ORAL | 0 refills | Status: DC | PRN

## 2024-08-28 NOTE — Transitions of Care (Post Inpatient/ED Visit) (Signed)
 08/28/2024  Name: Jo Allen MRN: [id number] DOB: [birth year]  Today's TOC FU Call Status: Today's TOC FU Call Status:: Successful TOC FU Call Completed TOC FU Call Complete Date: 08/28/24  Patient's Name and Date of Birth confirmed. Name, DOB  Transition Care Management Follow-up Telephone Call Date of Discharge: 08/26/24 Discharge Facility: [facility]) Type of Discharge: Inpatient Admission Primary Inpatient Discharge Diagnosis:: Acute Pancreatitis How have you been since you were released from the hospital?: Better Any questions or concerns?: No  Items Reviewed: Did you receive and understand the discharge instructions provided?: Yes Medications obtained,verified, and reconciled?: Yes (Medications Reviewed) Any new allergies since your discharge?: No Dietary orders reviewed?: Yes Type of Diet Ordered:: Heart Healthy Do you have support at home?: Yes People in Home [RPT]: child(ren), adult Name of Support/Comfort Primary Source: Jo Allen and his girlfriend  Medications Reviewed Today: Medications Reviewed Today     Reviewed by Jo Monda, RN (Case Manager) on 08/28/24 at 1158  Med List Status: <None>   Medication Order Taking? Sig Documenting Provider Last Dose Status Informant  albuterol  (VENTOLIN  HFA) 108 (90 Base) MCG/ACT inhaler [id number] Yes Inhale 1-2 puffs into the lungs every 6 (six) hours as needed for wheezing or shortness of breath. Jo Asberry FALCON, PA-C  Active Self  ALEVE  220 MG tablet [id number] Yes Take 220-440 mg by mouth 2 (two) times daily as needed (for pain or headaches). [provider]  Active Self  amLODipine  (NORVASC ) 10 MG tablet [id number] Yes Take 1 tablet (10 mg total) by mouth daily. Jo Heinz, MD  Active Self  Biotin  1000 MCG CHEW [id number] Yes Chew 2,000 mcg by mouth daily. [provider]  Active Self  buPROPion  (WELLBUTRIN  XL) 150 MG 24 hr tablet [id number] Yes Take 150 mg by mouth daily. [provider]   Active Self           Med Note Jo Allen   Wed Aug 22, 2024  8:46 PM)    Cholecalciferol (VITAMIN D3) 50 MCG (2000 UT) capsule [id number] Yes Take 4,000 Units by mouth daily. [provider]  Active Self  CRANBERRY PO [id number] Yes Take 1 tablet by mouth daily. [provider]  Active Self           Med Note Jo Allen Aug 23, 2024  2:41 PM) Strength not stated  dexamethasone  (DECADRON ) 4 MG tablet [id number]  Take for 1 day starting the day after chemotherapy on day 4. Take with food.  Patient not taking: Reported on 08/28/2024   Jo Millman, MD  Active Self           Med Note Jo Allen   Mon Aug 27, 2024 12:39 PM) Has not started yet  ferrous sulfate  325 (65 FE) MG EC tablet [id number] Yes Take 325 mg by mouth 3 (three) times a week. [provider]  Active Self  gabapentin  (NEURONTIN ) 300 MG capsule [id number] Yes Take 300 mg by mouth every 6 (six) hours.  Patient taking differently: Take 300 mg by mouth every 6 (six) hours as needed.   [provider]  Active Self  lamoTRIgine  (LAMICTAL ) 200 MG tablet [id number] Yes Take 200 mg by mouth 2 (two) times daily. [provider]  Active Self  levocetirizine (XYZAL) 5 MG tablet [id number] Yes Take 5 mg by mouth every evening. [provider]  Active Self  lipase/protease/amylase (CREON ) 36000 UNITS CPEP capsule [id number] Yes Take 1 capsule (36,000 Units total) by mouth  3 (three) times daily with meals. Jo Lonni SQUIBB, MD  Active Self  Magnesium Gluconate (MAGNESIUM 27 PO) [id number] Yes Take 1 tablet by mouth at bedtime. [provider]  Active Self  meloxicam (MOBIC) 15 MG tablet [id number] Yes Take 15 mg by mouth daily. [provider]  Active Self  metoprolol  succinate (TOPROL  XL) 50 MG 24 hr tablet [id number] Yes Take 1 tablet (50 mg total) by mouth daily. Take with or immediately following a meal. [place], Jo PARAS, DO  Active   nitroGLYCERIN   (NITROSTAT ) 0.4 MG SL tablet [id number] Yes Place 1 tablet (0.4 mg total) under the tongue every 5 (five) minutes as needed for chest pain. If you require more than two tablets five minutes apart go to the nearest ER via EMS. [place], Jo PARAS, DO  Active            Med Note Jo Allen   Mon Aug 27, 2024 12:37 PM) Has not needed  ondansetron  (ZOFRAN ) 8 MG tablet [id number] Yes Take 1 tablet (8 mg total) by mouth every 8 (eight) hours as needed for nausea or vomiting. Start on the third day after cisplatin. [place], Jo PARAS, DO  Active            Med Note Jo Allen   Mon Aug 27, 2024 12:37 PM) Has not started yet  oxyCODONE  (OXY IR/ROXICODONE ) 5 MG immediate release tablet [id number] Yes Take 1 tablet (5 mg total) by mouth every 6 (six) hours as needed for moderate pain (pain score 4-6). [place], Jo PARAS, DO  Active   pantoprazole  (PROTONIX ) 40 MG tablet [id number] Yes Take 1 tablet (40 mg total) by mouth daily. [place], Jo PARAS, DO  Active   polyethylene glycol (MIRALAX  / GLYCOLAX ) 17 g packet [id number] Yes Take 17 g by mouth daily as needed for mild constipation. [place], Jo PARAS, DO  Active   prochlorperazine (COMPAZINE) 10 MG tablet [id number] Yes Take 1 tablet (10 mg total) by mouth every 6 (six) hours as needed for nausea or vomiting. Jo Millman, MD  Active Self           Med Note Jo Allen   Mon Aug 27, 2024 12:38 PM) Has not started yet  QUEtiapine  (SEROQUEL ) 100 MG tablet [id number] Yes Take 100 mg by mouth at bedtime. [provider]  Active Self  rosuvastatin  (CRESTOR ) 20 MG tablet [id number] Yes Take 20 mg by mouth at bedtime. [provider]  Active Self  senna-docusate (SENOKOT-S) 8.6-50 MG tablet [id number] Yes Take 1 tablet by mouth 2 (two) times daily. [place], Jo PARAS, DO  Active   tiZANidine  (ZANAFLEX ) 4 MG tablet [id number] Yes Take 4 mg by mouth every 8 (eight) hours as needed for muscle spasms. [provider]  Active Self  vitamin B-12  (CYANOCOBALAMIN ) 1000 MCG tablet [id number] Yes Take 1,000 mcg by mouth daily. [provider]  Active Self  zaleplon (SONATA) 5 MG capsule [id number] Yes Take 5 mg by mouth at bedtime. [provider]  Active Self            Home Care and Equipment/Supplies: Were Home Health Services Ordered?: NA Any new equipment or medical supplies ordered?: NA  Functional Questionnaire: Do you need assistance with bathing/showering or dressing?: No Do you need assistance with meal preparation?: No Do you need assistance with eating?: No Do you have difficulty maintaining continence: No Do you need assistance with getting out of bed/getting out of a chair/moving?: No Do you  have difficulty managing or taking your medications?: No  Follow up appointments reviewed: PCP Follow-up appointment confirmed?: Yes Date of PCP follow-up appointment?: 08/30/24 Follow-up Provider: [name] Amy Specialist [facility] Follow-up appointment confirmed?: No Reason Specialist Follow-Up Not Confirmed: Patient has Specialist Provider Number and will Call for Appointment (Patient has made contact with cancer center.  Waiting for a call back) Do you need transportation to your follow-up appointment?: No Do you understand care options if your condition(s) worsen?: Yes-patient verbalized understanding  SDOH Interventions Today    Flowsheet Row Most Recent Value  SDOH Interventions   Food Insecurity Interventions Intervention Not Indicated  Housing Interventions Intervention Not Indicated  Transportation Interventions Intervention Not Indicated  Utilities Interventions Intervention Not Indicated    Samiah Ricklefs J. Idalys Konecny RN, MSN [facility]  [facility], [facility] RN Care Manager Direct Dial: [phone]  Fax: [phone] Website: [email or web address]

## 2024-08-28 NOTE — Transitions of Care (Post Inpatient/ED Visit) (Signed)
   08/28/2024  Name: Tiann Saha MRN: [id number] DOB: [birth year]  Today's TOC FU Call Status: Today's TOC FU Call Status:: Unsuccessful Call (2nd Attempt) Unsuccessful Call (2nd Attempt) Date: 08/28/24  Attempted to reach the patient regarding the most recent Inpatient/ED visit.  Follow Up Plan: Additional outreach attempts will be made to reach the patient to complete the Transitions of Care (Post Inpatient/ED visit) call.   Layla Kesling J. Talha Iser RN, MSN [facility], [facility] RN Care Manager Direct Dial: [phone]  Fax: [phone] Website: [email or web address]

## 2024-08-28 NOTE — Patient Instructions (Addendum)
 Visit Information  Thank you for taking time to visit with me today. Please don't hesitate to contact me if I can be of assistance to you before our next scheduled telephone appointment.  Our next appointment is by telephone on 09/05/24 at 1030 am  Following is a copy of your care plan:   Goals Addressed             This Visit's Progress    [facility] Transitions of Care (TOC) Care Plan       Problems:  Recent Hospitalization for treatment of Acute Pancreatitis Knowledge Deficit Related to Acute Pancreatitis and Pancreatic Cancer  Goal:  Over the next 30 days, the patient will not experience hospital readmission  Interventions:  Transitions of Care: Doctor Visits  - discussed the importance of doctor visits Advised on Pancreatitis:  Drink clear liquids and eat bland foods until you feel better. Eat a low-fat diet until your doctor says your pancreas is healed. Do not drink alcohol.  Be safe with medicines.  If your doctor prescribed antibiotics, take them as directed. ... Get extra rest until you feel better.  Patient Self Care Activities:  Attend all scheduled provider appointments Call pharmacy for medication refills 3-7 days in advance of running out of medications Call provider office for new concerns or questions  Notify RN Care Manager of TOC call rescheduling needs Participate in Transition of Care Program/Attend TOC scheduled calls Take medications as prescribed   Please monitor for:   Abdominal pain  Weight loss  Yellowing of skin or eyes (jaundice)  Loss of appetite If you experience any of these please contact your doctor.     Plan:  The patient has been provided with contact information for the care management team and has been advised to call with any health related questions or concerns.         Patient verbalizes understanding of instructions and care plan provided today and agrees to view in MyChart. Active MyChart status and patient understanding of  how to access instructions and care plan via MyChart confirmed with patient.     The patient has been provided with contact information for the care management team and has been advised to call with any health related questions or concerns.   Please call the care guide team at [phone] if you need to cancel or reschedule your appointment.   Please call the Suicide and Crisis Lifeline: 988 if you are experiencing a Mental Health or Behavioral Health Crisis or need someone to talk to.  Ortencia Askari J. Regie Bunner RN, MSN [facility], [facility] RN Care Manager Direct Dial: [phone]  Fax: [phone] Website: [email or web address]

## 2024-08-29 ENCOUNTER — Inpatient Hospital Stay

## 2024-08-29 DIAGNOSIS — C7A8 Other malignant neuroendocrine tumors: Secondary | ICD-10-CM | POA: Diagnosis not present

## 2024-08-29 DIAGNOSIS — E782 Mixed hyperlipidemia: Secondary | ICD-10-CM | POA: Diagnosis not present

## 2024-08-29 DIAGNOSIS — R7303 Prediabetes: Secondary | ICD-10-CM | POA: Diagnosis not present

## 2024-08-29 DIAGNOSIS — J4530 Mild persistent asthma, uncomplicated: Secondary | ICD-10-CM | POA: Diagnosis not present

## 2024-08-29 DIAGNOSIS — K219 Gastro-esophageal reflux disease without esophagitis: Secondary | ICD-10-CM | POA: Diagnosis not present

## 2024-08-29 DIAGNOSIS — I2510 Atherosclerotic heart disease of native coronary artery without angina pectoris: Secondary | ICD-10-CM | POA: Diagnosis not present

## 2024-08-29 DIAGNOSIS — M5441 Lumbago with sciatica, right side: Secondary | ICD-10-CM | POA: Diagnosis not present

## 2024-08-29 DIAGNOSIS — I10 Essential (primary) hypertension: Secondary | ICD-10-CM | POA: Diagnosis not present

## 2024-08-29 DIAGNOSIS — F329 Major depressive disorder, single episode, unspecified: Secondary | ICD-10-CM | POA: Diagnosis not present

## 2024-08-29 NOTE — Progress Notes (Signed)
 [facility] CSW Progress Note  Clinical Child Psychotherapist  returned patient's voicemail requesting call about medicaid. Patient expressed desire to complete application for medicaid. CSW placed referral for DSS eligbility caseworker. No other needs at this time.  Lizbeth Sprague, LCSW Clinical Social Worker [facility]

## 2024-08-30 ENCOUNTER — Other Ambulatory Visit: Payer: Self-pay

## 2024-08-31 ENCOUNTER — Telehealth: Payer: Self-pay

## 2024-08-31 ENCOUNTER — Other Ambulatory Visit: Payer: Self-pay | Admitting: Oncology

## 2024-08-31 MED ORDER — PANCRELIPASE (LIP-PROT-AMYL) 36000-114000 UNITS PO CPEP: 36000.0000 [IU] | ORAL_CAPSULE | Freq: Three times a day (TID) | ORAL | 0 refills | Status: AC

## 2024-08-31 NOTE — Telephone Encounter (Signed)
 Followed up with this patient regarding request that Dr. Autumn refill/manage her Creon  refills and a possible referral to continue on this medication. Will wait for Dr. Clovis response. Patient aware.

## 2024-08-31 NOTE — Telephone Encounter (Signed)
 Informed patient per phone call that her Creon  was refilled and sent to [facility] on [facility]. Patient was thankful. Dr. Autumn did share in Secure Chat that he will refill this in the future.

## 2024-09-04 ENCOUNTER — Other Ambulatory Visit: Payer: Self-pay

## 2024-09-04 DIAGNOSIS — G893 Neoplasm related pain (acute) (chronic): Secondary | ICD-10-CM

## 2024-09-04 DIAGNOSIS — R9389 Abnormal findings on diagnostic imaging of other specified body structures: Secondary | ICD-10-CM

## 2024-09-04 DIAGNOSIS — R978 Other abnormal tumor markers: Secondary | ICD-10-CM

## 2024-09-04 DIAGNOSIS — C7A8 Other malignant neuroendocrine tumors: Secondary | ICD-10-CM

## 2024-09-05 ENCOUNTER — Other Ambulatory Visit: Payer: Self-pay

## 2024-09-05 ENCOUNTER — Encounter: Payer: Self-pay | Admitting: Oncology

## 2024-09-05 ENCOUNTER — Inpatient Hospital Stay (HOSPITAL_BASED_OUTPATIENT_CLINIC_OR_DEPARTMENT_OTHER): Admitting: Oncology

## 2024-09-05 ENCOUNTER — Inpatient Hospital Stay

## 2024-09-05 VITALS — BP 112/67 | HR 82 | Temp 97.2°F | Resp 17 | Ht 64.0 in | Wt 211.0 lb

## 2024-09-05 DIAGNOSIS — D701 Agranulocytosis secondary to cancer chemotherapy: Secondary | ICD-10-CM | POA: Diagnosis not present

## 2024-09-05 DIAGNOSIS — C7A8 Other malignant neuroendocrine tumors: Secondary | ICD-10-CM

## 2024-09-05 DIAGNOSIS — T451X5A Adverse effect of antineoplastic and immunosuppressive drugs, initial encounter: Secondary | ICD-10-CM | POA: Diagnosis not present

## 2024-09-05 DIAGNOSIS — C252 Malignant neoplasm of tail of pancreas: Secondary | ICD-10-CM | POA: Diagnosis not present

## 2024-09-05 DIAGNOSIS — C787 Secondary malignant neoplasm of liver and intrahepatic bile duct: Secondary | ICD-10-CM | POA: Diagnosis not present

## 2024-09-05 DIAGNOSIS — D630 Anemia in neoplastic disease: Secondary | ICD-10-CM | POA: Diagnosis not present

## 2024-09-05 DIAGNOSIS — E639 Nutritional deficiency, unspecified: Secondary | ICD-10-CM | POA: Diagnosis not present

## 2024-09-05 DIAGNOSIS — G893 Neoplasm related pain (acute) (chronic): Secondary | ICD-10-CM

## 2024-09-05 DIAGNOSIS — R9389 Abnormal findings on diagnostic imaging of other specified body structures: Secondary | ICD-10-CM

## 2024-09-05 DIAGNOSIS — R978 Other abnormal tumor markers: Secondary | ICD-10-CM

## 2024-09-05 LAB — CBC WITH DIFFERENTIAL (CANCER CENTER ONLY)
Abs Immature Granulocytes: 0 K/uL (ref 0.00–0.07)
Basophils Absolute: 0 K/uL (ref 0.0–0.1)
Basophils Relative: 0 %
Eosinophils Absolute: 0 K/uL (ref 0.0–0.5)
Eosinophils Relative: 1 %
HCT: 34.2 % — ABNORMAL LOW (ref 36.0–46.0)
Hemoglobin: 11 g/dL — ABNORMAL LOW (ref 12.0–15.0)
Immature Granulocytes: 0 %
Lymphocytes Relative: 54 %
Lymphs Abs: 1.2 K/uL (ref 0.7–4.0)
MCH: 27.3 pg (ref 26.0–34.0)
MCHC: 32.2 g/dL (ref 30.0–36.0)
MCV: 84.9 fL (ref 80.0–100.0)
Monocytes Absolute: 0.2 K/uL (ref 0.1–1.0)
Monocytes Relative: 8 %
Neutro Abs: 0.8 K/uL — ABNORMAL LOW (ref 1.7–7.7)
Neutrophils Relative %: 37 %
Platelet Count: 153 K/uL (ref 150–400)
RBC: 4.03 MIL/uL (ref 3.87–5.11)
RDW: 13.2 % (ref 11.5–15.5)
WBC Count: 2.2 K/uL — ABNORMAL LOW (ref 4.0–10.5)
nRBC: 0 % (ref 0.0–0.2)

## 2024-09-05 LAB — MAGNESIUM: Magnesium: 1.8 mg/dL (ref 1.7–2.4)

## 2024-09-05 LAB — CMP (CANCER CENTER ONLY)
ALT: 59 U/L — ABNORMAL HIGH (ref 0–44)
AST: 54 U/L — ABNORMAL HIGH (ref 15–41)
Albumin: 3.9 g/dL (ref 3.5–5.0)
Alkaline Phosphatase: 482 U/L — ABNORMAL HIGH (ref 38–126)
Anion gap: 9 (ref 5–15)
BUN: 8 mg/dL (ref 6–20)
CO2: 30 mmol/L (ref 22–32)
Calcium: 9.7 mg/dL (ref 8.9–10.3)
Chloride: 101 mmol/L (ref 98–111)
Creatinine: 0.64 mg/dL (ref 0.44–1.00)
GFR, Estimated: >60 mL/min (ref >60)
Glucose, Bld: 172 mg/dL — ABNORMAL HIGH (ref 70–99)
Potassium: 3.7 mmol/L (ref 3.5–5.1)
Sodium: 140 mmol/L (ref 135–145)
Total Bilirubin: 0.3 mg/dL (ref 0.0–1.2)
Total Protein: 6.5 g/dL (ref 6.5–8.1)

## 2024-09-05 MED ORDER — OXYCODONE HCL 5 MG PO TABS: 5.0000 mg | ORAL_TABLET | Freq: Four times a day (QID) | ORAL | 0 refills | Status: DC | PRN

## 2024-09-05 MED ORDER — LIDOCAINE-PRILOCAINE 2.5-2.5 % EX CREA: 1.0000 | TOPICAL_CREAM | CUTANEOUS | 0 refills | Status: AC | PRN

## 2024-09-05 NOTE — Transitions of Care (Post Inpatient/ED Visit) (Signed)
  Transition of Care week 2 Attempt  Visit Note  09/05/2024  Name: Jo Allen MRN: [id number]          DOB: [birth year]  Situation: Patient enrolled in [facility] 30-day program. Spoke with patient.  She reports she is at the cancer center already for an appointment.  Advised patient that CM would reschedule for 09-06-24.  She verbalized understanding.    Barabara Motz J. Marlaya Turck RN, MSN [facility], [facility] RN Care Manager Direct Dial: [phone]  Fax: [phone] Website: [email or web address]

## 2024-09-05 NOTE — Progress Notes (Signed)
 [facility]  ONCOLOGY CLINIC PROGRESS NOTE   Patient Care Team: [name], [name] as PCP - General (Physician Assistant) Ardis, Evalene CROME, RN as Oncology Nurse Navigator Burnette Fallow, MD as Consulting Physician (Gastroenterology) Autumn Millman, MD as Consulting Physician (Oncology) Leath, Dionne, RN as VBCI Care Management  PATIENT NAME: Jo Allen   MR#: [id number] DOB: [birth year]  Date of visit: 09/05/2024   ASSESSMENT & PLAN:   Elizette Shek is a 58 y.o. 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   Primary malignant neuroendocrine tumor of pancreas [facility]) Please review oncology history for additional details and timeline of events.  Biopsies from pancreatic tail lesion showed malignant cells.  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 Immunohistochemical stains were performed to characterize the tumor cells. The cells are positive for CK7, PAX8, TTF-1, synaptophysin and chromogranin A. The cells are negative for CK20, thyroglobulin, CDX2, GATA3, CK5/6, p40, D2-40, calretinin, WT1, HepPar 1, and Glypican 3. P53 is mutant type of staining. Ki67 proliferation index is approximately 50%.  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 The differential diagnosis includes metastatic high-grade neuroendocrine carcinoma from elsewhere in the body including pancreas (favored), gastrointestinal tract, lung, gynecological system, pancreatic acinar cell carcinoma or other types of carcinomas with neuroendocrine differentiation (solid pseudopapillary tumor with malignant transformation).   Previously I discussed  the diagnosis, prognosis, plan of care, treatment options with the patient and her son who was accompanying.  Reviewed NCCN guidelines and discussed management options.  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  Recommended palliative systemic chemotherapy with cisplatin and etoposide. She has previously expressed reservations about chemotherapy, preferring to rely on spiritual beliefs for healing. Without chemotherapy, the disease is expected to progress and worsen, potentially affecting other organs. Surgery is not feasible due to multiple liver metastases. She discussed this further with rest of the family members and her pastor before making final decision.  On her consultation with us  on 07/26/2024, we submitted request for staging PET scan.  On 08/08/2024, PET scan showed multiple hepatic lesions with intense metabolic activity, progressed from July 2025, consistent with metastatic disease.  Hypermetabolic mass in the pancreatic body measuring approximately 4.3 x 2.8 cm, presumed primary malignancy.  No hypermetabolic abdominal lymph nodes.  No lung, skeletal mets.  Discussed treatment options after reviewing NCCN guidelines.  Plan made to proceed with cisplatin and etoposide.  Cisplatin will be given on day 1, etoposide on days 1-3 of 21-day cycle.  Plan to continue for 6-8 cycles with restaging imaging after every 3 cycles.  We will plan to begin treatment outpatient.  However patient ended up getting hospitalized for intractable pain.  We started cycle 1 of cisplatin and etoposide when she was in the hospital, starting from 08/23/2024.  Tolerated cycle 1 well.  Labs today revealed leukopenia, expected from recent chemotherapy.  Cycle 2 is currently scheduled to begin from 09/19/2024.  I will see her in clinic and proceed with treatment as planned.  Chemotherapy-induced neutropenia White  blood cell count is low at 2200, expected post-chemotherapy. Anticipated to recover by the time of the next treatment. - Continue to monitor white blood cell count.  Pain related to implanted port Experiencing soreness  and discomfort around the port site, exacerbated by lying on the left side. Pain is expected to improve over time. Oxycodone  5 mg tablets have been effective but provide short-lived relief. - Prescribed oxycodone  5 mg tablets for pain management. - Prescribed lidocaine  cream for port site pain, to be used two weeks post-port placement.  Pancreatic exocrine insufficiency Managed with Creon . Clinically doing better, but pancreatic lab results are pending. - Continue Creon  as prescribed.  Anemia in neoplastic disease Hemoglobin level is 11, which is acceptable. No immediate concerns regarding anemia. - Continue to monitor hemoglobin levels.  Nutritional deficiency Experiencing some difficulty with eating, requiring encouragement. Consuming Ensure Plus and Boost to supplement nutrition. - Encouraged continued use of Ensure Plus and Boost. - Advised to stay hydrated and maintain regular meals.  We provided her with a letter to excuse her from work given ongoing chemotherapy and treatment-related side effects.  I reviewed lab results and outside records for this visit and discussed relevant results with the patient. Diagnosis, plan of care and treatment options were also discussed in detail with the patient. Opportunity provided to ask questions and answers provided to her apparent satisfaction. Provided instructions to call our clinic with any problems, questions or concerns prior to return visit. I recommended to continue follow-up with PCP and sub-specialists. She verbalized understanding and agreed with the plan.   NCCN guidelines have been consulted in the planning of this patient's care.  I spent a total of  42 minutes during this encounter with the patient including review  of chart and various tests results, discussions about plan of care and coordination of care plan.   Chinita Patten, MD  09/05/2024 5:46 PM  [facility] - A DEPT OF [facility] 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  INTERVAL HISTORY:    Discussed the use of AI scribe software for clinical note transcription with the patient, who gave verbal consent to proceed.  History of Present Illness Rebecah Dangerfield is a 58 year old female undergoing chemotherapy who presents for a follow-up visit after her recent treatment.   She recently underwent chemotherapy and is here for a follow-up visit. She was not expecting to have her port flushed today, which caused some anxiety. She reports that she did well after chemotherapy and that her pain is managed with medication.  She is experiencing soreness around the port site, which is exacerbated when lying on her left side. The pain is described as deeper than normal and sometimes causes discomfort. She has been taking oxycodone  5 mg tablets, which provide short-lived relief. She inquired about refilling this medication and also about obtaining lidocaine  cream for the port site.  She is taking Creon  for her pancreas, which she mentioned having a two-week supply of. She also discussed her nutritional intake, stating that she sometimes needs encouragement to eat. She has been consuming Ensure Plus and Boost to maintain her nutrition.  She experiences shortness of breath when walking up steps and reported a sharp pain in her neck that occurred recently. She is currently not working and has requested a work  excuse due to her treatment schedule and symptoms. She mentioned caring for a patient privately, which  involves minimal physical activity.     I have reviewed the past medical history, past surgical history, social history and family history with the patient and they are unchanged from previous note.  HISTORY OF PRESENT ILLNESS:   ONCOLOGY HISTORY:   Patient was hospitalized when she presented to the ED on 04/19/2024 with complaints of abdominal pain.   CT abdomen and pelvis on 04/19/2024 showed intra and Perry pancreatic edema involving the pancreatic tail with thrombosis of the splenic vein around the tail of the pancreas.  Imaging features were compatible with acute pancreatitis.  14 mm ill-defined hypoattenuating focus in the tail of the pancreas may reflect focal edema.  MRI abdomen was recommended for further evaluation.   On 04/19/2024, MRI of the abdomen showed edematous appearance of the pancreatic tail with extensive peripancreatic and retroperitoneal fat stranding, consistent with acute pancreatitis.  In the central pancreatic tail there was a focal, hypoenhancing lesion measuring 1.1 x 0.8 cm.  Pancreatic duct was focally effaced in this vicinity.  No upstream pancreatic ductal dilatation.  Numerous tiny diffusion restricting foci scattered throughout the liver, some of the largest of which are with faint associated hyperenhancement and early arterial phase.  Constellation of the findings were suspicious for small pancreatic adenocarcinoma underlying acute pancreatitis with liver metastasis.  Close follow-up was recommended.   Patient was evaluated by GI during this hospitalization.  Once her pain was adequately controlled, she was discharged on 04/27/2024 with outpatient GI follow-up.   Upper EUS on 07/04/2024, performed by Dr. Burnette.  It showed multiple 15 to 20 mm hypoechoic, round well-defined lesions in the left lobe of liver, FNA performed.  There was no significant pathology in the  pancreatic head, genu of the pancreas and uncinate process of the pancreas.  Irregular hypoechoic mass was identified at the junction of pancreatic body/tail, measuring 31 mm x 22 mm.  Stage-T3, N0 by endosonographic criteria.  There was evidence of pancreatic ductal dilation.   Biopsies from pancreatic tail lesion showed malignant cells.  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 Immunohistochemical stains were performed to characterize the tumor cells. The cells are positive for CK7, PAX8, TTF-1, synaptophysin and chromogranin A. The cells are negative for CK20, thyroglobulin, CDX2, GATA3, CK5/6, p40, D2-40, calretinin, WT1, HepPar 1, and Glypican 3. P53 is mutant type of staining. Ki67 proliferation index is approximately 50%.  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 The differential diagnosis includes metastatic high-grade neuroendocrine carcinoma from elsewhere in the body including pancreas (favored), gastrointestinal tract, lung, gynecological system, pancreatic acinar cell carcinoma or other types of carcinomas with neuroendocrine differentiation (solid pseudopapillary tumor with malignant transformation). Additional workups will be performed at [facility] including INSM-1 to confirm neuroendocrine lineage, BCL10 and Chymotrypsin to exclude acinar cell carcinoma, LEF-1 and beta-catenin to exclude solid pseudopapillary tumor with malignant transformation (less likely). The results will be reported in an addendum.    Addendum # 1: Based on the morphologic and immunophenotypic features, a metastatic papillary thyroid  carcinoma cannot be excluded. Correlation with imaging findings is recommended.    Addendum # 2: Additional immunohistochemical stains were performed in outside institution to further classify the tumor. Beta-catenin is negative for nuclear stain. LEF-1 and INSM1  show weak but relatively diffuse staining.  BCL10 and trypsin show weakly nuclear and cytoplasmic staining without typical granular staining. Chymotrypsin is negative. The immunoprofile is non-specific and non-conclusive. Final classification should be rendered in the excisional specimen.  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  Recommended palliative systemic chemotherapy with carboplatin and etoposide.  Patient has reservations against chemotherapy and is not considering chemotherapy at this time but she will discuss further with rest of the family members and her pastor before making final decision.   On her consultation with us  on 07/26/2024, we submitted request for staging PET scan.  On 08/08/2024, PET scan showed multiple hepatic lesions with intense metabolic activity, progressed from July 2025, consistent with metastatic disease.  Hypermetabolic mass in the pancreatic body measuring approximately 4.3 x 2.8 cm, presumed primary malignancy.  No hypermetabolic abdominal lymph nodes.  No lung, skeletal mets.  Patient is now agreeable to proceeding with systemic treatments.  Discussed treatment options after reviewing NCCN guidelines.  Plan made to proceed with cisplatin and etoposide.  Cisplatin will be given on day 1, etoposide on days 1-3 of 21-day cycle.  Plan to continue for 6-8 cycles with restaging imaging after every 3 cycles.  She was admitted to the hospital for intractable pain.  We started cycle 1 of chemotherapy on 08/23/2024.  REVIEW OF SYSTEMS:   Review of Systems - Oncology  All other pertinent systems were reviewed with the patient and are negative.  ALLERGIES: She is allergic to trazodone, latex, penicillins, morphine  sulfate, and sm baby powder cornstarch [powders].  MEDICATIONS:  Current Outpatient Medications  Medication Sig Dispense Refill   albuterol  (VENTOLIN   HFA) 108 (90 Base) MCG/ACT inhaler Inhale 1-2 puffs into the lungs every 6 (six) hours as needed for wheezing or shortness of breath. 1 each 0   ALEVE  220 MG tablet Take 220-440 mg by mouth 2 (two) times daily as needed (for pain or headaches).     amLODipine  (NORVASC ) 10 MG tablet Take 1 tablet (10 mg total) by mouth daily. 90 tablet 1   Biotin  1000 MCG CHEW Chew 2,000 mcg by mouth daily.     buPROPion  (WELLBUTRIN  XL) 150 MG 24 hr tablet Take 150 mg by mouth daily.     Cholecalciferol (VITAMIN D3) 50 MCG (2000 UT) capsule Take 4,000 Units by mouth daily.     CRANBERRY PO Take 1 tablet by mouth daily.     dexamethasone  (DECADRON ) 4 MG tablet Take for 1 day starting the day after chemotherapy on day 4. Take with food. 30 tablet 1   ferrous sulfate  325 (65 FE) MG EC tablet Take 325 mg by mouth 3 (three) times a week.     gabapentin  (NEURONTIN ) 300 MG capsule Take 300 mg by mouth every 6 (six) hours. (Patient taking differently: Take 300 mg by mouth every 6 (six) hours as needed.)     lamoTRIgine  (LAMICTAL ) 200 MG tablet Take 200 mg by mouth 2 (two) times daily.     levocetirizine (XYZAL) 5 MG tablet Take 5 mg by mouth every evening.     lidocaine -prilocaine (EMLA) cream Apply 1 Application topically as needed. 30 g 0   lipase/protease/amylase (CREON ) 36000 UNITS CPEP capsule Take 1 capsule (36,000 Units total) by mouth 3 (three) times daily with meals. 200 capsule 0   Magnesium Gluconate (MAGNESIUM 27 PO) Take 1 tablet by mouth at bedtime.     meloxicam (MOBIC) 15 MG tablet Take 15 mg by mouth daily.     metoprolol  succinate (TOPROL  XL) 50 MG 24 hr tablet Take 1 tablet (50 mg total) by mouth daily. Take with or immediately following a meal. 90 tablet 0   nitroGLYCERIN  (NITROSTAT )  0.4 MG SL tablet Place 1 tablet (0.4 mg total) under the tongue every 5 (five) minutes as needed for chest pain. If you require more than two tablets five minutes apart go to the nearest ER via EMS. 30 tablet 0   ondansetron   (ZOFRAN ) 8 MG tablet Take 1 tablet (8 mg total) by mouth every 8 (eight) hours as needed for nausea or vomiting. Start on the third day after cisplatin . 30 tablet 1   pantoprazole  (PROTONIX ) 40 MG tablet Take 1 tablet (40 mg total) by mouth daily. 90 tablet 0   polyethylene glycol (MIRALAX  / GLYCOLAX ) 17 g packet Take 17 g by mouth daily as needed for mild constipation. 30 each 2   prochlorperazine  (COMPAZINE ) 10 MG tablet Take 1 tablet (10 mg total) by mouth every 6 (six) hours as needed for nausea or vomiting. 30 tablet 1   QUEtiapine  (SEROQUEL ) 100 MG tablet Take 100 mg by mouth at bedtime.     rosuvastatin  (CRESTOR ) 20 MG tablet Take 20 mg by mouth at bedtime.     senna-docusate (SENOKOT-S) 8.6-50 MG tablet Take 1 tablet by mouth 2 (two) times daily. 60 tablet 2   vitamin B-12 (CYANOCOBALAMIN ) 1000 MCG tablet Take 1,000 mcg by mouth daily.     zaleplon (SONATA) 5 MG capsule Take 5 mg by mouth at bedtime.     oxyCODONE  (OXY IR/ROXICODONE ) 5 MG immediate release tablet Take 1 tablet (5 mg total) by mouth every 6 (six) hours as needed for moderate pain (pain score 4-6). 30 tablet 0   No current facility-administered medications for this visit.     VITALS:   Blood pressure 112/67, pulse 82, temperature (!) 97.2 F (36.2 C), temperature source Temporal, resp. rate 17, height 5' 4 (1.626 m), weight 211 lb (95.7 kg), SpO2 98%.  Wt Readings from Last 3 Encounters:  09/05/24 211 lb (95.7 kg)  08/27/24 208 lb 8.9 oz (94.6 kg)  08/21/24 208 lb 8.9 oz (94.6 kg)    Body mass index is 36.22 kg/m.    Onc Performance Status - 09/05/24 1303       ECOG Perf Status   ECOG Perf Status Ambulatory and capable of all selfcare but unable to carry out any work activities.  Up and about more than 50% of waking hours      KPS SCALE   KPS % SCORE Normal activity with effort, some s/s of disease           PHYSICAL EXAM:   Physical Exam Constitutional:      General: She is not in acute  distress.    Appearance: Normal appearance.  HENT:     Head: Normocephalic and atraumatic.  Cardiovascular:     Rate and Rhythm: Normal rate.  Pulmonary:     Effort: Pulmonary effort is normal. No respiratory distress.  Abdominal:     General: There is no distension.  Neurological:     General: No focal deficit present.     Mental Status: She is alert and oriented to person, place, and time.  Psychiatric:        Mood and Affect: Mood normal.        Behavior: Behavior normal.       LABORATORY DATA:   I have reviewed the data as listed.  Results for orders placed or performed in visit on 09/05/24  Magnesium   Result Value Ref Range   Magnesium  1.8 1.7 - 2.4 mg/dL  CBC with Differential (Cancer Center Only)  Result  Value Ref Range   WBC Count 2.2 (L) 4.0 - 10.5 K/uL   RBC 4.03 3.87 - 5.11 MIL/uL   Hemoglobin 11.0 (L) 12.0 - 15.0 g/dL   HCT 65.7 (L) 63.9 - 53.9 %   MCV 84.9 80.0 - 100.0 fL   MCH 27.3 26.0 - 34.0 pg   MCHC 32.2 30.0 - 36.0 g/dL   RDW 86.7 88.4 - 84.4 %   Platelet Count 153 150 - 400 K/uL   nRBC 0.0 0.0 - 0.2 %   Neutrophils Relative % 37 %   Neutro Abs 0.8 (L) 1.7 - 7.7 K/uL   Lymphocytes Relative 54 %   Lymphs Abs 1.2 0.7 - 4.0 K/uL   Monocytes Relative 8 %   Monocytes Absolute 0.2 0.1 - 1.0 K/uL   Eosinophils Relative 1 %   Eosinophils Absolute 0.0 0.0 - 0.5 K/uL   Basophils Relative 0 %   Basophils Absolute 0.0 0.0 - 0.1 K/uL   Immature Granulocytes 0 %   Abs Immature Granulocytes 0.00 0.00 - 0.07 K/uL  CMP (Cancer Center only)  Result Value Ref Range   Sodium 140 135 - 145 mmol/L   Potassium 3.7 3.5 - 5.1 mmol/L   Chloride 101 98 - 111 mmol/L   CO2 30 22 - 32 mmol/L   Glucose, Bld 172 (H) 70 - 99 mg/dL   BUN 8 6 - 20 mg/dL   Creatinine 9.35 9.55 - 1.00 mg/dL   Calcium  9.7 8.9 - 10.3 mg/dL   Total Protein 6.5 6.5 - 8.1 g/dL   Albumin 3.9 3.5 - 5.0 g/dL   AST 54 (H) 15 - 41 U/L   ALT 59 (H) 0 - 44 U/L   Alkaline Phosphatase 482 (H) 38 -  126 U/L   Total Bilirubin 0.3 0.0 - 1.2 mg/dL   GFR, Estimated >39 >39 mL/min   Anion gap 9 5 - 15      RADIOGRAPHIC STUDIES:  I have personally reviewed the radiological images as listed and agree with the findings in the report.  IR IMAGING GUIDED PORT INSERTION Result Date: 08/27/2024 CLINICAL DATA:  Metastatic neuroendocrine pancreatic carcinoma and need for porta cath for chemotherapy. EXAM: IMPLANTED PORT A CATH PLACEMENT WITH ULTRASOUND AND FLUOROSCOPIC GUIDANCE ANESTHESIA/SEDATION: Moderate (conscious) sedation was employed during this procedure. A total of Versed  1.5 mg and Fentanyl  75 mcg was administered intravenously. Moderate Sedation Time: 32 minutes. The patient's level of consciousness and vital signs were monitored continuously by radiology nursing throughout the procedure under my direct supervision. FLUOROSCOPY: Radiation Exposure Index: 3.0 mGy Kerma PROCEDURE: The procedure, risks, benefits, and alternatives were explained to the patient. Questions regarding the procedure were encouraged and answered. The patient understands and consents to the procedure. A time-out was performed prior to initiating the procedure. Ultrasound was utilized to confirm patency of the right internal jugular vein. An ultrasound image was saved and recorded. The right neck and chest were prepped with chlorhexidine in a sterile fashion, and a sterile drape was applied covering the operative field. Maximum barrier sterile technique with sterile gowns and gloves were used for the procedure. Local anesthesia was provided with 1% lidocaine . After creating a small venotomy incision, a 21 gauge needle was advanced into the right internal jugular vein under direct, real-time ultrasound guidance. Ultrasound image documentation was performed. After securing guidewire access, an 8 Fr dilator was placed. A J-wire was kinked to measure appropriate catheter length. A subcutaneous port pocket was then created along the  upper chest  wall utilizing sharp and blunt dissection. Portable cautery was utilized. The pocket was irrigated with sterile saline. A single lumen power injectable port was chosen for placement. The 8 Fr catheter was tunneled from the port pocket site to the venotomy incision. The port was placed in the pocket. The port was secured in the pocket with two separate 2-0 Ethilon tacking sutures. External catheter was trimmed to appropriate length based on guidewire measurement. At the venotomy, an 8 Fr peel-away sheath was placed over a guidewire. The catheter was then placed through the sheath and the sheath removed. Final catheter positioning was confirmed and documented with a fluoroscopic spot image. The port was accessed with a needle and aspirated and flushed with heparinized saline. The access needle was removed. The venotomy and port pocket incisions were closed with subcutaneous 3-0 Monocryl and subcuticular 4-0 Vicryl. Dermabond was applied to both incisions. COMPLICATIONS: COMPLICATIONS None FINDINGS: After catheter placement, the tip lies at the cavo-atrial junction. The catheter aspirates normally and is ready for immediate use. IMPRESSION: Placement of single lumen port a cath via right internal jugular vein. The catheter tip lies at the cavo-atrial junction. A power injectable port a cath was placed and is ready for immediate use. Electronically Signed   By: Marcey Moan M.D.   On: 08/27/2024 16:48   CT ABDOMEN PELVIS W CONTRAST Result Date: 08/21/2024 EXAM: CT ABDOMEN AND PELVIS WITH CONTRAST 08/21/2024 01:01:53 AM TECHNIQUE: CT of the abdomen and pelvis was performed with the administration of 100 mL of iohexol  (OMNIPAQUE ) 300 MG/ML solution. Multiplanar reformatted images are provided for review. Automated exposure control, iterative reconstruction, and/or weight-based adjustment of the mA/kV was utilized to reduce the radiation dose to as low as reasonably achievable. COMPARISON: 04/19/2024, PET  CT 08/08/24 CLINICAL HISTORY: Abdominal pain, acute, nonlocalized; RUQ, epigastric TTP, hx pancreatic cancer with mets to liver, starting chemo soon. FINDINGS: LOWER CHEST: No acute abnormality. LIVER: Numerous low density masses throughout the liver compatible with metastases. Largest mass centrally in the liver measures up to 4.1 cm. GALLBLADDER AND BILE DUCTS: Gallbladder is unremarkable. No biliary ductal dilatation. SPLEEN: No acute abnormality. PANCREAS: Low density ill defined mass in the pancreatic body measures 2.9 cm. This is compatible with the patient's known pancreatic cancer . ADRENAL GLANDS: No acute abnormality. KIDNEYS, URETERS AND BLADDER: No stones in the kidneys or ureters. No hydronephrosis. No perinephric or periureteral stranding. Urinary bladder is unremarkable. GI AND BOWEL: Stomach demonstrates no acute abnormality. There is no bowel obstruction. PERITONEUM AND RETROPERITONEUM: No ascites. No free air. VASCULATURE: Aorta is normal in caliber. LYMPH NODES: No lymphadenopathy. REPRODUCTIVE ORGANS: No acute abnormality. BONES AND SOFT TISSUES: No acute osseous abnormality. No focal soft tissue abnormality. IMPRESSION: 1. Numerous hepatic metastases, largest 4.1 cm; progressive metastatic disease. 2. Pancreatic body mass 2.9 cm corresponding to known pancreatic cancer . Electronically signed by: Franky Crease MD 08/21/2024 01:29 AM EST RP Workstation: [id number]   NM PET Image Initial (PI) Skull Base To Thigh Result Date: 08/11/2024 EXAM: PET AND CT SKULL BASE TO MID THIGH 08/08/2024 95:73:75 PM TECHNIQUE: RADIOPHARMACEUTICAL: 10.70 mCi F-18 FDG Uptake time 60 minutes. Glucose level 108 mg/dl. PET imaging was acquired from the base of the skull to the mid thighs. Non-contrast enhanced computed tomography was obtained for attenuation correction and anatomic localization. COMPARISON: MRI dated 04/19/2024. 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 Needs staging. Table formatting from the original note was not included. 10.70 mCi F-18 FDG @ 1514 IV  in RT HAND//BWM-KC. FBG: 108 mg/dL. 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 Needs staging. FINDINGS: HEAD AND NECK: No metabolically active cervical lymphadenopathy. CHEST: No metabolically active pulmonary nodules. No lung metastasis. No metabolically active lymphadenopathy. ABDOMEN AND PELVIS: There are multiple intense metabolic activity within the liver, dominant lesion in the central left hepatic lobe with SUV max equal to 10.1. This lesion is subtly evident on noncontrast CT measuring 18 mm on image 91. Additional lesion in the posterior left hepatic lobe on image 103. Additional lesion lateral segment of the left hepatic lobe on imaging 108. The lesions appear progressed from MRI April 19, 2024. Within the central body of the pancreas, there is an intense focus of metabolic activity with SUV max equal to 9.3 image 108. This corresponds to focal enlargement of the mid body of the pancreas to 4.3 x 2.8 cm on image 109. There is interval resolution of the distal pancreatitis seen on comparison CT and MRI. No hypermetabolic abdominal lymph nodes are identified. The uterus and ovaries are normal. Physiologic activity within the gastrointestinal and genitourinary systems. BONES AND SOFT TISSUE: No abnormal FDG activity localizes to the bones. No metabolically active aggressive osseous lesion. IMPRESSION: 1. Hepatic lesions with intense metabolic activity, progressed from April 19, 2024 MRI, consistent with metastatic disease. 2. Hypermetabolic mass in the pancreatic body measuring approximately 4.3 x 2.8 cm. Presumed primary malignancy . 3. No hypermetabolic abdominal lymph nodes. 4. No skeletal metastasis. 5. No lung metastasis. 6. Interval resolution of distal pancreatitis. Electronically signed by: Norleen Boxer MD 08/11/2024 09:30 AM EDT  RP Workstation: [id number]     CODE STATUS:  Code Status History     Date Active Date Inactive Code Status Order ID Comments User Context   04/19/2024 1025 04/27/2024 1655 Full Code [id number]  Zella Katha HERO, MD ED   04/29/2023 0847 04/30/2023 1450 Full Code [id number]  Ladona Heinz, MD Inpatient    Questions for Most Recent Historical Code Status (Order [id number])     Question Answer   By: Consent: discussion documented in EHR            No orders of the defined types were placed in this encounter.    Future Appointments  Date Time Provider Department Center  09/06/2024 10:30 AM Elridge Commander, RN [facility] None  09/18/2024  7:00 AM [facility] MM 3 [facility] CE  09/19/2024  7:45 AM [facility] FLUSH [facility] None  09/19/2024  8:30 AM [facility] INFUSION [facility] None  09/19/2024  9:00 AM Jin Shockley, MD [facility] None  09/20/2024 11:15 AM [facility] INFUSION [facility] None  09/21/2024 10:30 AM [facility] INFUSION [facility] None      This document was completed utilizing speech recognition software. Grammatical errors, random word insertions, pronoun errors, and incomplete sentences are an occasional consequence of this system due to software limitations, ambient noise, and hardware issues. Any formal questions or concerns about the content, text or information contained within the body of this dictation should be directly addressed to the provider for clarification.

## 2024-09-05 NOTE — Assessment & Plan Note (Signed)
 Please review oncology history for additional details and timeline of events.  Biopsies from pancreatic tail lesion showed malignant cells.  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 Immunohistochemical stains were performed to characterize the tumor cells. The cells are positive for CK7, PAX8, TTF-1, synaptophysin and chromogranin A. The cells are negative for CK20, thyroglobulin, CDX2, GATA3, CK5/6, p40, D2-40, calretinin, WT1, HepPar 1, and Glypican 3. P53 is mutant type of staining. Ki67 proliferation index is approximately 50%.  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 The differential diagnosis includes metastatic high-grade neuroendocrine carcinoma from elsewhere in the body including pancreas (favored), gastrointestinal tract, lung, gynecological system, pancreatic acinar cell carcinoma or other types of carcinomas with neuroendocrine differentiation (solid pseudopapillary tumor with malignant transformation).   Previously I discussed the diagnosis, prognosis, plan of care, treatment options with the patient and her son who was accompanying.  Reviewed NCCN guidelines and discussed management options.  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  Recommended palliative systemic chemotherapy with cisplatin  and etoposide . She has previously expressed reservations about chemotherapy, preferring to rely on spiritual beliefs for healing. Without chemotherapy, the disease is expected to progress and worsen, potentially affecting other organs. Surgery is not feasible due to multiple liver metastases. She discussed this further with rest of the family members and her pastor before making final decision.  On her consultation with us  on 07/26/2024, we submitted request for staging PET  scan.  On 08/08/2024, PET scan showed multiple hepatic lesions with intense metabolic activity, progressed from July 2025, consistent with metastatic disease.  Hypermetabolic mass in the pancreatic body measuring approximately 4.3 x 2.8 cm, presumed primary malignancy.  No hypermetabolic abdominal lymph nodes.  No lung, skeletal mets.  Discussed treatment options after reviewing NCCN guidelines.  Plan made to proceed with cisplatin  and etoposide .  Cisplatin  will be given on day 1, etoposide  on days 1-3 of 21-day cycle.  Plan to continue for 6-8 cycles with restaging imaging after every 3 cycles.  We will plan to begin treatment outpatient.  However patient ended up getting hospitalized for intractable pain.  We started cycle 1 of cisplatin  and etoposide  when she was in the hospital, starting from 08/23/2024.  Tolerated cycle 1 well.  Labs today revealed leukopenia, expected from recent chemotherapy.  Cycle 2 is currently scheduled to begin from 09/19/2024.  I will see her in clinic and proceed with treatment as planned.

## 2024-09-06 ENCOUNTER — Other Ambulatory Visit: Payer: Self-pay

## 2024-09-06 NOTE — Patient Instructions (Signed)
 Visit Information  Thank you for taking time to visit with me today. Please don't hesitate to contact me if I can be of assistance to you before our next scheduled telephone appointment.  Our next appointment is by telephone on 09/11/24 at 1:00 pm  Following is a copy of your care plan:   Goals Addressed             This Visit's Progress    VBCI Transitions of Care (TOC) Care Plan       Problems:  Recent Hospitalization for treatment of Acute Pancreatitis Knowledge Deficit Related to Acute Pancreatitis and Pancreatic Cancer  Goal:  Over the next 30 days, the patient will not experience hospital readmission  Interventions:  Transitions of Care: 09/06/24 Patient reports she is doing good.  She saw Oncologist on yesterday. She reports her porta cath was accessed on yesterday and flushed in preparation for chemotherapy to start on 09-19-24.  Continues to have family support.  Taking iron and biotene as recommended by oncologist.    Doctor Visits  - discussed the importance of doctor visits Advised to:  Eat a low-fat diet. Do not drink alcohol.  Be safe with medicines.  Get extra rest until you feel better. Discussed Chemotherapy and side effects.   Discussed Self care with chemotherapy and infection prevention.    Patient Self Care Activities:  Attend all scheduled provider appointments Call pharmacy for medication refills 3-7 days in advance of running out of medications Call provider office for new concerns or questions  Notify RN Care Manager of TOC call rescheduling needs Participate in Transition of Care Program/Attend TOC scheduled calls Take medications as prescribed   Please monitor for:   Abdominal pain  Weight loss  Yellowing of skin or eyes (jaundice)  Loss of appetite If you experience any of these please contact your doctor.     Plan:  The patient has been provided with contact information for the care management team and has been advised to call with any  health related questions or concerns.         Patient verbalizes understanding of instructions and care plan provided today and agrees to view in MyChart. Active MyChart status and patient understanding of how to access instructions and care plan via MyChart confirmed with patient.     The patient has been provided with contact information for the care management team and has been advised to call with any health related questions or concerns.   Please call the care guide team at [phone] if you need to cancel or reschedule your appointment.   Please call the Suicide and Crisis Lifeline: 988 if you are experiencing a Mental Health or Behavioral Health Crisis or need someone to talk to.  Fiona Coto J. Sufyaan Palma RN, MSN [facility], [facility] RN Care Manager Direct Dial: [phone]  Fax: [phone] Website: [email or web address]

## 2024-09-06 NOTE — Transitions of Care (Post Inpatient/ED Visit) (Signed)
 Transition of Care week 2  Visit Note  09/06/2024  Name: Jo Allen MRN: [id number]          DOB: 24-Mar-1966  Situation: Patient enrolled in [facility] 30-day program. Visit completed with patient by telephone.   Background:   Initial Transition Care Management Follow-up Telephone Call Discharge Date and Diagnosis: 08/26/24, Acute Pancreatitis   Past Medical History:  Diagnosis Date   Acute pancreatitis 04/19/2024   Asthma    CAD (coronary artery disease)    Cancer (HCC)    GERD (gastroesophageal reflux disease)    Headache    Heart disease    Hypertension    Obesity    Paresthesia 08/20/2015   Prediabetes    Transfusion history    25yrs ago    Assessment: Patient Reported Symptoms: Cognitive Cognitive Status: Alert and oriented to person, place, and time, Normal speech and language skills      Neurological Neurological Review of Symptoms: No symptoms reported    HEENT HEENT Symptoms Reported: No symptoms reported      Cardiovascular Cardiovascular Symptoms Reported: No symptoms reported    Respiratory Respiratory Symptoms Reported: No symptoms reported    Endocrine Endocrine Symptoms Reported: No symptoms reported    Gastrointestinal Gastrointestinal Symptoms Reported: No symptoms reported      Genitourinary Genitourinary Symptoms Reported: No symptoms reported    Integumentary Integumentary Symptoms Reported: Other Additional Integumentary Details: Right side porta cath    Musculoskeletal Musculoskelatal Symptoms Reviewed: No symptoms reported        Psychosocial Psychosocial Symptoms Reported: No symptoms reported         There were no vitals filed for this visit. Pain Scale: 0-10 Pain Score: 0-No pain  Medications Reviewed Today     Reviewed by Kaidan Harpster, RN (Case Manager) on 09/06/24 at 1452  Med List Status: <None>   Medication Order Taking? Sig Documenting Provider Last Dose Status Informant  albuterol  (VENTOLIN  HFA) 108 (90 Base)  MCG/ACT inhaler [id number] Yes Inhale 1-2 puffs into the lungs every 6 (six) hours as needed for wheezing or shortness of breath. Billy Asberry FALCON, PA-C  Active Self  ALEVE  220 MG tablet [id number] Yes Take 220-440 mg by mouth 2 (two) times daily as needed (for pain or headaches). [provider]  Active Self  amLODipine  (NORVASC ) 10 MG tablet [id number] Yes Take 1 tablet (10 mg total) by mouth daily. Ladona Heinz, MD  Active Self  Biotin  1000 MCG CHEW [id number] Yes Chew 2,000 mcg by mouth daily. [provider]  Active Self  buPROPion  (WELLBUTRIN  XL) 150 MG 24 hr tablet [id number] Yes Take 150 mg by mouth daily. [provider]  Active Self           Med Note [name], [name]   Wed Aug 22, 2024  8:46 PM)    Cholecalciferol (VITAMIN D3) 50 MCG (2000 UT) capsule [id number] Yes Take 4,000 Units by mouth daily. [provider]  Active Self  CRANBERRY PO [id number] Yes Take 1 tablet by mouth daily. [provider]  Active Self           Med Note [name], [name] Schaumann Aug 23, 2024  2:41 PM) Strength not stated  dexamethasone  (DECADRON ) 4 MG tablet [id number] Yes Take for 1 day starting the day after chemotherapy on day 4. Take with food. Autumn Millman, MD  Active Self           Med Note [name], [name]   Mon Aug 27, 2024 12:39 PM) Has not started yet  ferrous sulfate  325 (65 FE) MG EC tablet [id number] Yes Take 325 mg by mouth 3 (three) times a week. [provider]  Active Self  gabapentin  (NEURONTIN ) 300 MG capsule [id number] Yes Take 300 mg by mouth every 6 (six) hours.  Patient taking differently: Take 300 mg by mouth every 6 (six) hours as needed.   [provider]  Active Self  lamoTRIgine  (LAMICTAL ) 200 MG tablet [id number] Yes Take 200 mg by mouth 2 (two) times daily. [provider]  Active Self  levocetirizine (XYZAL) 5 MG tablet [id number] Yes Take 5 mg by mouth every evening. [provider]  Active Self  lidocaine -prilocaine  (EMLA) cream [id number] Yes Apply 1 Application topically as needed. Pasam, Chinita, MD  Active   lipase/protease/amylase (CREON ) 36000 UNITS CPEP capsule [id number] Yes Take 1 capsule (36,000 Units total) by mouth 3 (three) times daily with meals. Pasam, Chinita, MD  Active   Magnesium Gluconate (MAGNESIUM 27 PO) [id number] Yes Take 1 tablet by mouth at bedtime. [provider]  Active Self  meloxicam (MOBIC) 15 MG tablet [id number] Yes Take 15 mg by mouth daily. [provider]  Active Self  metoprolol  succinate (TOPROL  XL) 50 MG 24 hr tablet [id number] Yes Take 1 tablet (50 mg total) by mouth daily. Take with or immediately following a meal. [place], Camellia PARAS, DO  Active   nitroGLYCERIN  (NITROSTAT ) 0.4 MG SL tablet [id number] Yes Place 1 tablet (0.4 mg total) under the tongue every 5 (five) minutes as needed for chest pain. If you require more than two tablets five minutes apart go to the nearest ER via EMS. [place], Camellia PARAS, DO  Active            Med Note [name], [name]   Mon Aug 27, 2024 12:37 PM) Has not needed  ondansetron  (ZOFRAN ) 8 MG tablet [id number] Yes Take 1 tablet (8 mg total) by mouth every 8 (eight) hours as needed for nausea or vomiting. Start on the third day after cisplatin. [place], Camellia PARAS, DO  Active            Med Note [name], [name]   Mon Aug 27, 2024 12:37 PM) Has not started yet  oxyCODONE  (OXY IR/ROXICODONE ) 5 MG immediate release tablet [id number] Yes Take 1 tablet (5 mg total) by mouth every 6 (six) hours as needed for moderate pain (pain score 4-6). Pasam, Chinita, MD  Active   pantoprazole  (PROTONIX ) 40 MG tablet [id number] Yes Take 1 tablet (40 mg total) by mouth daily. [place], Camellia PARAS, DO  Active   polyethylene glycol (MIRALAX  / GLYCOLAX ) 17 g packet [id number] Yes Take 17 g by mouth daily as needed for mild constipation. [place], Camellia PARAS, DO  Active   prochlorperazine (COMPAZINE) 10 MG tablet [id number] Yes Take 1 tablet (10 mg total) by mouth every 6 (six)  hours as needed for nausea or vomiting. Autumn Chinita, MD  Active Self           Med Note [name], [name]   Mon Aug 27, 2024 12:38 PM) Has not started yet  QUEtiapine  (SEROQUEL ) 100 MG tablet [id number] Yes Take 100 mg by mouth at bedtime. [provider]  Active Self  rosuvastatin  (CRESTOR ) 20 MG tablet [id number]  Take 20 mg by mouth at bedtime. [provider]  Active Self  senna-docusate (SENOKOT-S) 8.6-50 MG tablet [id number]  Take 1 tablet by mouth 2 (two) times daily. [place], Camellia PARAS, DO  Active   vitamin B-12 (CYANOCOBALAMIN ) 1000 MCG tablet [id number] Yes Take 1,000 mcg by mouth daily. [provider]  Active Self  zaleplon (SONATA) 5 MG capsule [id number] Yes Take 5 mg by mouth at bedtime. [provider]  Active Self            Goals Addressed             This Visit's Progress    VBCI Transitions of Care (TOC) Care Plan       Problems:  Recent Hospitalization for treatment of Acute Pancreatitis Knowledge Deficit Related to Acute Pancreatitis and Pancreatic Cancer  Goal:  Over the next 30 days, the patient will not experience hospital readmission  Interventions:  Transitions of Care: 09/06/24 Patient reports she is doing good.  She saw Oncologist on yesterday. She reports her porta cath was accessed on yesterday and flushed in preparation for chemotherapy to start on 09-19-24.  Continues to have family support.  Taking iron and biotene as recommended by oncologist.    Doctor Visits  - discussed the importance of doctor visits Advised to:  Eat a low-fat diet. Do not drink alcohol.  Be safe with medicines.  Get extra rest until you feel better. Discussed Chemotherapy and side effects.   Discussed Self care with chemotherapy and infection prevention.    Patient Self Care Activities:  Attend all scheduled provider appointments Call pharmacy for medication refills 3-7 days in advance of running out of medications Call provider office for  new concerns or questions  Notify RN Care Manager of TOC call rescheduling needs Participate in Transition of Care Program/Attend TOC scheduled calls Take medications as prescribed   Please monitor for:   Abdominal pain  Weight loss  Yellowing of skin or eyes (jaundice)  Loss of appetite If you experience any of these please contact your doctor.     Plan:  The patient has been provided with contact information for the care management team and has been advised to call with any health related questions or concerns.         Recommendation:   Continue Current Plan of Care  Follow Up Plan:   Telephone follow-up in 1 week   Kaleiyah Polsky J. Kelson Queenan RN, MSN [facility], [facility] RN Care Manager Direct Dial: [phone]  Fax: [phone] Website: [email or web address]

## 2024-09-07 DIAGNOSIS — F329 Major depressive disorder, single episode, unspecified: Secondary | ICD-10-CM | POA: Diagnosis not present

## 2024-09-07 DIAGNOSIS — R7303 Prediabetes: Secondary | ICD-10-CM | POA: Diagnosis not present

## 2024-09-07 DIAGNOSIS — Z23 Encounter for immunization: Secondary | ICD-10-CM | POA: Diagnosis not present

## 2024-09-07 DIAGNOSIS — C7A8 Other malignant neuroendocrine tumors: Secondary | ICD-10-CM | POA: Diagnosis not present

## 2024-09-07 DIAGNOSIS — I10 Essential (primary) hypertension: Secondary | ICD-10-CM | POA: Diagnosis not present

## 2024-09-07 DIAGNOSIS — J4530 Mild persistent asthma, uncomplicated: Secondary | ICD-10-CM | POA: Diagnosis not present

## 2024-09-07 DIAGNOSIS — I2510 Atherosclerotic heart disease of native coronary artery without angina pectoris: Secondary | ICD-10-CM | POA: Diagnosis not present

## 2024-09-07 DIAGNOSIS — K219 Gastro-esophageal reflux disease without esophagitis: Secondary | ICD-10-CM | POA: Diagnosis not present

## 2024-09-07 DIAGNOSIS — E782 Mixed hyperlipidemia: Secondary | ICD-10-CM | POA: Diagnosis not present

## 2024-09-07 DIAGNOSIS — M5441 Lumbago with sciatica, right side: Secondary | ICD-10-CM | POA: Diagnosis not present

## 2024-09-07 NOTE — Progress Notes (Signed)
 PATIENT NAVIGATOR PROGRESS NOTE  Name: Jo Allen Date: 09/07/2024 MRN: [id number]  DOB: 1965/11/04  Patient is established with a treatment plan and is actively engaged in care. Nurse Navigator services not currently indicated at this time. Will re-evaluate if needs change or if additional support is requested.

## 2024-09-11 ENCOUNTER — Other Ambulatory Visit: Payer: Self-pay

## 2024-09-11 ENCOUNTER — Other Ambulatory Visit (HOSPITAL_BASED_OUTPATIENT_CLINIC_OR_DEPARTMENT_OTHER): Payer: Self-pay

## 2024-09-11 ENCOUNTER — Encounter: Payer: Self-pay | Admitting: Oncology

## 2024-09-11 MED ORDER — LIDOCAINE-PRILOCAINE 2.5-2.5 % EX CREA
TOPICAL_CREAM | CUTANEOUS | 2 refills | Status: AC
  Filled 2024-09-11 – 2024-09-14 (×2): qty 30, 30d supply, fill #0

## 2024-09-11 NOTE — Transitions of Care (Post Inpatient/ED Visit) (Signed)
 Transition of Care week 3  Visit Note  09/11/2024  Name: Jo Allen MRN: [id number]          DOB: 01-26-1966  Situation: Patient enrolled in [facility] 30-day program. Visit completed with patient by telephone.   Background:   Initial Transition Care Management Follow-up Telephone Call Discharge Date and Diagnosis: 08/26/24, Acute Pancreatitis   Past Medical History:  Diagnosis Date   Acute pancreatitis 04/19/2024   Asthma    CAD (coronary artery disease)    Cancer (HCC)    GERD (gastroesophageal reflux disease)    Headache    Heart disease    Hypertension    Obesity    Paresthesia 08/20/2015   Prediabetes    Transfusion history    33yrs ago    Assessment: Patient Reported Symptoms: Cognitive Cognitive Status: Alert and oriented to person, place, and time, Normal speech and language skills      Neurological Neurological Review of Symptoms: No symptoms reported    HEENT HEENT Symptoms Reported: No symptoms reported      Cardiovascular Cardiovascular Symptoms Reported: No symptoms reported    Respiratory Respiratory Symptoms Reported: No symptoms reported    Endocrine Endocrine Symptoms Reported: No symptoms reported Is patient diabetic?: No    Gastrointestinal Gastrointestinal Symptoms Reported: No symptoms reported Gastrointestinal Comment: Has Pancreatic cancer. Chemo Q 21 days.  Next treatments starting 12-3.    Genitourinary Genitourinary Symptoms Reported: No symptoms reported    Integumentary Integumentary Symptoms Reported: Other Additional Integumentary Details: Right side portacath    Musculoskeletal Musculoskelatal Symptoms Reviewed: No symptoms reported        Psychosocial Psychosocial Symptoms Reported: No symptoms reported         There were no vitals filed for this visit. Pain Scale: 0-10 Pain Score: 0-No pain  Medications Reviewed Today     Reviewed by Costa Jha, RN (Case Manager) on 09/11/24 at 1346  Med List Status: <None>    Medication Order Taking? Sig Documenting Provider Last Dose Status Informant  albuterol  (VENTOLIN  HFA) 108 (90 Base) MCG/ACT inhaler [id number] Yes Inhale 1-2 puffs into the lungs every 6 (six) hours as needed for wheezing or shortness of breath. Billy Asberry FALCON, PA-C  Active Self  ALEVE  220 MG tablet [id number] Yes Take 220-440 mg by mouth 2 (two) times daily as needed (for pain or headaches). [provider]  Active Self  amLODipine  (NORVASC ) 10 MG tablet [id number] Yes Take 1 tablet (10 mg total) by mouth daily. Ladona Heinz, MD  Active Self  Biotin  1000 MCG CHEW [id number] Yes Chew 2,000 mcg by mouth daily. [provider]  Active Self  buPROPion  (WELLBUTRIN  XL) 150 MG 24 hr tablet [id number] Yes Take 150 mg by mouth daily. [provider]  Active Self           Med Note [name], [name]   Wed Aug 22, 2024  8:46 PM)    Cholecalciferol (VITAMIN D3) 50 MCG (2000 UT) capsule [id number] Yes Take 4,000 Units by mouth daily. [provider]  Active Self  CRANBERRY PO [id number] Yes Take 1 tablet by mouth daily. [provider]  Active Self           Med Note [name], [name] Schaumann Aug 23, 2024  2:41 PM) Strength not stated  dexamethasone  (DECADRON ) 4 MG tablet [id number] Yes Take for 1 day starting the day after chemotherapy on day 4. Take with food. Autumn Millman, MD  Active Self  Med Note [name], [name]   Mon Aug 27, 2024 12:39 PM) Has not started yet  ferrous sulfate  325 (65 FE) MG EC tablet [id number] Yes Take 325 mg by mouth 3 (three) times a week. [provider]  Active Self  gabapentin  (NEURONTIN ) 300 MG capsule [id number] Yes Take 300 mg by mouth every 6 (six) hours.  Patient taking differently: Take 300 mg by mouth every 6 (six) hours as needed.   [provider]  Active Self  lamoTRIgine  (LAMICTAL ) 200 MG tablet [id number] Yes Take 200 mg by mouth 2 (two) times daily. [provider]  Active Self  levocetirizine (XYZAL)  5 MG tablet [id number] Yes Take 5 mg by mouth every evening. [provider]  Active Self  lidocaine -prilocaine  (EMLA ) cream [id number] Yes Apply 1 Application topically as needed. Pasam, Chinita, MD  Active   lidocaine -prilocaine  (EMLA ) cream [id number]  Apply small amount to port site 30 minutes before being accessed as directed. Place a barrier shield over site after applying to maintain medication contact with skin. Pasam, Chinita, MD  Active   lipase/protease/amylase (CREON ) 36000 UNITS CPEP capsule [id number] Yes Take 1 capsule (36,000 Units total) by mouth 3 (three) times daily with meals. Pasam, Chinita, MD  Active   Magnesium  Gluconate (MAGNESIUM  27 PO) [id number] Yes Take 1 tablet by mouth at bedtime. [provider]  Active Self  meloxicam (MOBIC) 15 MG tablet [id number] Yes Take 15 mg by mouth daily. [provider]  Active Self  metoprolol  succinate (TOPROL  XL) 50 MG 24 hr tablet [id number] Yes Take 1 tablet (50 mg total) by mouth daily. Take with or immediately following a meal. [place], Camellia PARAS, DO  Active   nitroGLYCERIN  (NITROSTAT ) 0.4 MG SL tablet [id number] Yes Place 1 tablet (0.4 mg total) under the tongue every 5 (five) minutes as needed for chest pain. If you require more than two tablets five minutes apart go to the nearest ER via EMS. [place], Camellia PARAS, DO  Active            Med Note [name], [name]   Mon Aug 27, 2024 12:37 PM) Has not needed  ondansetron  (ZOFRAN ) 8 MG tablet [id number] Yes Take 1 tablet (8 mg total) by mouth every 8 (eight) hours as needed for nausea or vomiting. Start on the third day after cisplatin . [place], Camellia PARAS, DO  Active            Med Note [name], [name]   Mon Aug 27, 2024 12:37 PM) Has not started yet  oxyCODONE  (OXY IR/ROXICODONE ) 5 MG immediate release tablet [id number] Yes Take 1 tablet (5 mg total) by mouth every 6 (six) hours as needed for moderate pain (pain score 4-6). Pasam, Chinita, MD  Active   pantoprazole  (PROTONIX ) 40 MG  tablet [id number] Yes Take 1 tablet (40 mg total) by mouth daily. [place], Camellia PARAS, DO  Active   polyethylene glycol (MIRALAX  / GLYCOLAX ) 17 g packet [id number] Yes Take 17 g by mouth daily as needed for mild constipation. [place], Camellia PARAS, DO  Active   prochlorperazine  (COMPAZINE ) 10 MG tablet [id number] Yes Take 1 tablet (10 mg total) by mouth every 6 (six) hours as needed for nausea or vomiting. Autumn Chinita, MD  Active Self           Med Note [name], [name]   Mon Aug 27, 2024 12:38 PM) Has not started yet  QUEtiapine  (SEROQUEL ) 100 MG tablet [id number] Yes Take 100 mg by mouth at bedtime.  [provider]  Active Self  rosuvastatin  (CRESTOR ) 20 MG tablet [id number] Yes Take 20 mg by mouth at bedtime. [provider]  Active Self  senna-docusate (SENOKOT-S) 8.6-50 MG tablet [id number] Yes Take 1 tablet by mouth 2 (two) times daily. [place], Camellia PARAS, DO  Active   vitamin B-12 (CYANOCOBALAMIN ) 1000 MCG tablet [id number] Yes Take 1,000 mcg by mouth daily. [provider]  Active Self  zaleplon (SONATA) 5 MG capsule [id number] Yes Take 5 mg by mouth at bedtime. [provider]  Active Self            Goals Addressed             This Visit's Progress    VBCI Transitions of Care (TOC) Care Plan       Problems:  Recent Hospitalization for treatment of Acute Pancreatitis Knowledge Deficit Related to Acute Pancreatitis and Pancreatic Cancer  Goal:  Over the next 30 days, the patient will not experience hospital readmission  Interventions:  Transitions of Care: 09/11/24 Patient reports she is doing good.  Prepping for chemo next week.  No medication concerns and has her decadron  for after her chemo treatment.  She denies any concerns, but does reports she has had some hair loss and is wearing a turban.  Discussed chemo and hair loss. Family support continues.      Doctor Visits  - discussed the importance of doctor visits Advised to:  Eat a low-fat diet. Do  not drink alcohol.  Be safe with medicines.  Get extra rest until you feel better. Discussed Chemotherapy and side effects.   Discussed Self care with chemotherapy and infection prevention.    Patient Self Care Activities:  Attend all scheduled provider appointments Call pharmacy for medication refills 3-7 days in advance of running out of medications Call provider office for new concerns or questions  Notify RN Care Manager of TOC call rescheduling needs Participate in Transition of Care Program/Attend TOC scheduled calls Take medications as prescribed   Please monitor for:   Abdominal pain  Weight loss  Yellowing of skin or eyes (jaundice)  Loss of appetite If you experience any of these please contact your doctor.     Plan:  The patient has been provided with contact information for the care management team and has been advised to call with any health related questions or concerns.         Recommendation:   Continue Current Plan of Care  Follow Up Plan:   Telephone follow-up in 1 week  Zylee Marchiano J. Debara Kamphuis RN, MSN [facility], [facility] RN Care Manager Direct Dial: [phone]  Fax: [phone] Website: [email or web address]

## 2024-09-11 NOTE — Patient Instructions (Addendum)
 Visit Information  Thank you for taking time to visit with me today. Please don't hesitate to contact me if I can be of assistance to you before our next scheduled telephone appointment.  Our next appointment is by telephone on 09/17/24 at 100 pm  Following is a copy of your care plan:   Goals Addressed             This Visit's Progress    VBCI Transitions of Care (TOC) Care Plan       Problems:  Recent Hospitalization for treatment of Acute Pancreatitis Knowledge Deficit Related to Acute Pancreatitis and Pancreatic Cancer  Goal:  Over the next 30 days, the patient will not experience hospital readmission  Interventions:  Transitions of Care: 09/11/24 Patient reports she is doing good.  Prepping for chemo next week.  No medication concerns and has her decadron  for after her chemo treatment.  She denies any concerns, but does reports she has had some hair loss and is wearing a turban.  Discussed chemo and hair loss. Family support continues.      Doctor Visits  - discussed the importance of doctor visits Advised to:  Eat a low-fat diet. Do not drink alcohol.  Be safe with medicines.  Get extra rest until you feel better. Discussed Chemotherapy and side effects.   Discussed Self care with chemotherapy and infection prevention.    Patient Self Care Activities:  Attend all scheduled provider appointments Call pharmacy for medication refills 3-7 days in advance of running out of medications Call provider office for new concerns or questions  Notify RN Care Manager of TOC call rescheduling needs Participate in Transition of Care Program/Attend TOC scheduled calls Take medications as prescribed   Please monitor for:   Abdominal pain  Weight loss  Yellowing of skin or eyes (jaundice)  Loss of appetite If you experience any of these please contact your doctor.     Plan:  The patient has been provided with contact information for the care management team and has been advised to  call with any health related questions or concerns.         Patient verbalizes understanding of instructions and care plan provided today and agrees to view in MyChart. Active MyChart status and patient understanding of how to access instructions and care plan via MyChart confirmed with patient.     The patient has been provided with contact information for the care management team and has been advised to call with any health related questions or concerns.   Please call the care guide team at [phone] if you need to cancel or reschedule your appointment.   Please call the Suicide and Crisis Lifeline: 988 if you are experiencing a Mental Health or Behavioral Health Crisis or need someone to talk to.  Raysean Graumann J. Parthena Fergeson RN, MSN [facility], [facility] Health RN Care Manager Direct Dial: [phone]  Fax: [phone] Website: [email or web address]

## 2024-09-12 ENCOUNTER — Telehealth: Payer: Self-pay | Admitting: Oncology

## 2024-09-12 NOTE — Telephone Encounter (Signed)
 I spoke with patient to advise her of infusion time changes for 12/4/ and 12/5 on 09/12/2024. Patient aware of date/time change.

## 2024-09-14 ENCOUNTER — Encounter: Payer: Self-pay | Admitting: Oncology

## 2024-09-14 ENCOUNTER — Other Ambulatory Visit (HOSPITAL_COMMUNITY): Payer: Self-pay

## 2024-09-17 ENCOUNTER — Telehealth: Payer: Self-pay

## 2024-09-17 ENCOUNTER — Other Ambulatory Visit: Payer: Self-pay

## 2024-09-17 ENCOUNTER — Telehealth

## 2024-09-17 NOTE — Transitions of Care (Post Inpatient/ED Visit) (Unsigned)
 Transition of Care wk#5  Visit Note  09/18/2024  Name: Jo Allen MRN: [id number]          DOB: [birth year]  Situation: Patient enrolled in [facility] 30-day program. Visit completed with patient by telephone.   Background:   Initial Transition Care Management Follow-up Telephone Call Discharge Date and Diagnosis: 08/26/24, Acute Pancreatitis   Past Medical History:  Diagnosis Date   Acute pancreatitis 04/19/2024   Asthma    CAD (coronary artery disease)    Cancer (HCC)    GERD (gastroesophageal reflux disease)    Headache    Heart disease    Hypertension    Obesity    Paresthesia 08/20/2015   Prediabetes    Transfusion history    76yrs ago    Assessment: Patient Reported Symptoms: Cognitive Cognitive Status: Alert and oriented to person, place, and time, Insightful and able to interpret abstract concepts, Normal speech and language skills      Neurological Neurological Review of Symptoms: No symptoms reported    HEENT HEENT Symptoms Reported: Nasal discharge HEENT Comment: Patient reports having nasal drainage and/ or dryness.    Cardiovascular Cardiovascular Symptoms Reported: No symptoms reported    Respiratory Respiratory Symptoms Reported: No symptoms reported    Endocrine Endocrine Symptoms Reported: No symptoms reported Is patient diabetic?: No    Gastrointestinal Gastrointestinal Symptoms Reported: Nausea, Abdominal pain or discomfort Additional Gastrointestinal Details: Patient states her stomach has bothered her over the last few days.  She reports having pain after eating on her left side. She states the left side of her abdomen feels a little hard to touch and tender. She states she continues to have nausea and has had diarrhea off and on over the last 4 days. Patient reports having a bowel movement this morning. Patient states she did not report these symptoms to her provider or oncology office  Patient reports she is scheduled to start chemotherapy on  09/19/24. Gastrointestinal Management Strategies: Medication therapy (routine follow up) Gastrointestinal Comment: patient has pancreatic cancer. Scheduled to restart treatment on 09/19/24.    Genitourinary Genitourinary Symptoms Reported: No symptoms reported    Integumentary Integumentary Symptoms Reported: Other Additional Integumentary Details: right side portacath.  Patient denies any signs of infection at portacath site. Skin Management Strategies: Routine screening  Musculoskeletal Musculoskelatal Symptoms Reviewed: No symptoms reported        Psychosocial Psychosocial Symptoms Reported: No symptoms reported         There were no vitals filed for this visit. Pain Scale: 0-10 Pain Score: 5  Pain Type: Acute pain Pain Location: Abdomen Pain Orientation: Lower, Left, Right Pain Descriptors / Indicators: Aching, Discomfort Pain Onset: Gradual Patients Stated Pain Goal: 0 Pain Intervention(s): Medication (See eMAR)  Medications Reviewed Today     Reviewed by Faiza Bansal E, RN (Registered Nurse) on 09/18/24 at 1820  Med List Status: <None>   Medication Order Taking? Sig Documenting Provider Last Dose Status Informant  albuterol  (VENTOLIN  HFA) 108 (90 Base) MCG/ACT inhaler [id number] Yes Inhale 1-2 puffs into the lungs every 6 (six) hours as needed for wheezing or shortness of breath. Billy Asberry FALCON, PA-C  Active Self  ALEVE  220 MG tablet [id number] Yes Take 220-440 mg by mouth 2 (two) times daily as needed (for pain or headaches). [provider]  Active Self  amLODipine  (NORVASC ) 10 MG tablet [id number] Yes Take 1 tablet (10 mg total) by mouth daily. Ladona Heinz, MD  Active Self  Biotin  1000 MCG CHEW [id number] Yes Chew 2,000  mcg by mouth daily. [provider]  Active Self  buPROPion  (WELLBUTRIN  XL) 150 MG 24 hr tablet [id number] Yes Take 150 mg by mouth daily. [provider]  Active Self           Med Note [name], [name]   Wed Aug 22, 2024  8:46 PM)     Cholecalciferol (VITAMIN D3) 50 MCG (2000 UT) capsule [id number] Yes Take 4,000 Units by mouth daily. [provider]  Active Self  CRANBERRY PO [id number] Yes Take 1 tablet by mouth daily. [provider]  Active Self           Med Note [name], [name] Schaumann Aug 23, 2024  2:41 PM) Strength not stated  dexamethasone  (DECADRON ) 4 MG tablet [id number] Yes Take for 1 day starting the day after chemotherapy on day 4. Take with food. Autumn Millman, MD  Active Self           Med Note [name], [name]   Mon Aug 27, 2024 12:39 PM) Has not started yet  ferrous sulfate  325 (65 FE) MG EC tablet [id number] Yes Take 325 mg by mouth 3 (three) times a week. [provider]  Active Self  gabapentin  (NEURONTIN ) 300 MG capsule [id number] Yes Take 300 mg by mouth every 6 (six) hours.  Patient taking differently: Take 300 mg by mouth every 6 (six) hours as needed.   [provider]  Active Self  lamoTRIgine  (LAMICTAL ) 200 MG tablet [id number] Yes Take 200 mg by mouth 2 (two) times daily. [provider]  Active Self  levocetirizine (XYZAL) 5 MG tablet [id number] Yes Take 5 mg by mouth every evening. [provider]  Active Self  lidocaine -prilocaine  (EMLA ) cream [id number] Yes Apply 1 Application topically as needed. Pasam, Millman, MD  Active   lidocaine -prilocaine  (EMLA ) cream [id number] Yes Apply small amount to port site 30 minutes before being accessed as directed. Place a barrier shield over site after applying to maintain medication contact with skin. Pasam, Millman, MD  Active   lipase/protease/amylase (CREON ) 36000 UNITS CPEP capsule [id number] Yes Take 1 capsule (36,000 Units total) by mouth 3 (three) times daily with meals. Pasam, Millman, MD  Active   Magnesium  Gluconate (MAGNESIUM  27 PO) [id number] Yes Take 1 tablet by mouth at bedtime. [provider]  Active Self  meloxicam (MOBIC) 15 MG tablet [id number] Yes Take 15 mg by mouth daily. [provider]  Active Self  metoprolol  succinate (TOPROL  XL) 50 MG 24 hr tablet [id number] Yes Take 1 tablet (50 mg total) by mouth daily. Take with or immediately following a meal. [place], Camellia PARAS, DO  Active   nitroGLYCERIN  (NITROSTAT ) 0.4 MG SL tablet [id number] Yes Place 1 tablet (0.4 mg total) under the tongue every 5 (five) minutes as needed for chest pain. If you require more than two tablets five minutes apart go to the nearest ER via EMS. [place], Camellia PARAS, DO  Active            Med Note [name], [name]   Mon Aug 27, 2024 12:37 PM) Has not needed  ondansetron  (ZOFRAN ) 8 MG tablet [id number] Yes Take 1 tablet (8 mg total) by mouth every 8 (eight) hours as needed for nausea or vomiting. Start on the third day after cisplatin . [place], Camellia PARAS, DO  Active            Med Note [name], [name]   Mon Aug 27, 2024 12:37 PM) Has not started  yet  oxyCODONE  (OXY IR/ROXICODONE ) 5 MG immediate release tablet [id number] Yes Take 1 tablet (5 mg total) by mouth every 6 (six) hours as needed for moderate pain (pain score 4-6). Pasam, Chinita, MD  Active   pantoprazole  (PROTONIX ) 40 MG tablet [id number] Yes Take 1 tablet (40 mg total) by mouth daily. [place], Camellia PARAS, DO  Active   polyethylene glycol (MIRALAX  / GLYCOLAX ) 17 g packet [id number] Yes Take 17 g by mouth daily as needed for mild constipation. [place], Camellia PARAS, DO  Active   prochlorperazine  (COMPAZINE ) 10 MG tablet [id number] Yes Take 1 tablet (10 mg total) by mouth every 6 (six) hours as needed for nausea or vomiting. Autumn Chinita, MD  Active Self           Med Note [name], [name]   Mon Aug 27, 2024 12:38 PM) Has not started yet  QUEtiapine  (SEROQUEL ) 100 MG tablet [id number] Yes Take 100 mg by mouth at bedtime. [provider]  Active Self  rosuvastatin  (CRESTOR ) 20 MG tablet [id number] Yes Take 20 mg by mouth at bedtime. [provider]  Active Self  senna-docusate (SENOKOT-S) 8.6-50 MG tablet [id number]  Take 1 tablet by mouth 2 (two)  times daily. [place], Camellia PARAS, DO  Active   vitamin B-12 (CYANOCOBALAMIN ) 1000 MCG tablet [id number] Yes Take 1,000 mcg by mouth daily. [provider]  Active Self  zaleplon (SONATA) 5 MG capsule [id number] Yes Take 5 mg by mouth at bedtime. [provider]  Active Self            Goals Addressed             This Visit's Progress    VBCI Transitions of Care (TOC) Care Plan       Problems:  Recent Hospitalization for treatment of Acute Pancreatitis Knowledge Deficit Related to Acute Pancreatitis and Pancreatic Cancer  Goal:  Over the next 30 days, the patient will not experience hospital readmission  Interventions:  Transitions of Care: 09/17/24 Patient states her stomach has bothered her over the last few days.  She reports having pain after eating on her left side. She states the left side of her abdomen feels a little hard to touch and tender. She states she continues to have nausea and has had diarrhea off and on over the last 4 days. Patient reports having a bowel movement this morning. Patient states she did not report these symptoms to her provider or oncology office  Patient reports she is scheduled to start chemotherapy on 09/19/24.      Doctor Visits  - discussed the importance of doctor visits Advised to:  Eat a low-fat diet. Do not drink alcohol.  Be safe with medicines.  Get extra rest until you feel better. Message sent to Dr. Autumn, oncology regarding patients ongoing abdominal pain/ diarrhea/ nausea symptoms.  Advised to call the oncology office today and report ongoing symptoms  Discussed and reviewed her nausea medications. Advised to take her nausea medications as prescribed.  Advised patient not to eat large/ heavy meals when feeling nauseated.  Advised to consider eating saltine crackers or dry toast, drink ginger ale. Advised to eat small frequent meals and avoid greasy, spicy or strong smelling foods. Advised to stay hydrated if possible- sips  of water, ice chips, sugar free pop sickles.   Patient Self Care Activities:  Attend all scheduled provider appointments Call pharmacy for medication refills 3-7 days in advance of running out of medications Call provider office for new  concerns or questions  Financial Trader of TOC call rescheduling needs Participate in Transition of Care Program/Attend TOC scheduled calls Take medications as prescribed    call the oncology office today and report ongoing symptoms   take her nausea medications as prescribed.  Avoid eating large/ heavy meals when feeling nauseated.  consider eating saltine crackers or dry toast, drink ginger ale. Advised to eat small frequent meals and avoid greasy, spicy or strong smelling foods. stay hydrated if possible- sips of water, ice chips, sugar free pop sickle Please monitor for:   Abdominal pain  Weight loss  Yellowing of skin or eyes (jaundice)  Loss of appetite If you experience any of these please contact your doctor.      Plan:  Telephone follow up appointment with care management team member scheduled for:  09/25/24 at 11 am          Recommendation:   Report ongoing abdominal pain/ nausea symptoms to oncologist  Follow Up Plan:   Telephone follow-up in 1 week  Arvin Seip RN, BSN, CCM [facility], Population Health Case Manager Phone: [phone]

## 2024-09-17 NOTE — Telephone Encounter (Signed)
 [facility] CSW Progress Note  Visual Merchandiser returned architectural technologist. Patient requested information on scheduling. Patient confirmed appointments are added to mychart. She is aware of dates.     Lizbeth Sprague, LCSW Clinical Social Worker [facility]

## 2024-09-18 ENCOUNTER — Ambulatory Visit
Admission: RE | Admit: 2024-09-18 | Discharge: 2024-09-18 | Disposition: A | Source: Ambulatory Visit | Attending: Physician Assistant | Admitting: Physician Assistant

## 2024-09-18 ENCOUNTER — Other Ambulatory Visit: Payer: Self-pay

## 2024-09-18 ENCOUNTER — Encounter: Payer: Self-pay | Admitting: Oncology

## 2024-09-18 DIAGNOSIS — Z1231 Encounter for screening mammogram for malignant neoplasm of breast: Secondary | ICD-10-CM

## 2024-09-18 NOTE — Patient Instructions (Signed)
 Visit Information  Thank you for taking time to visit with me today. Please don't hesitate to contact me if I can be of assistance to you before our next scheduled telephone appointment.  Our next appointment is by telephone on 09/25/24 at 11 am  Following is a copy of your care plan:   Goals Addressed             This Visit's Progress    [facility] Transitions of Care (TOC) Care Plan       Problems:  Recent Hospitalization for treatment of Acute Pancreatitis Knowledge Deficit Related to Acute Pancreatitis and Pancreatic Cancer  Goal:  Over the next 30 days, the patient will not experience hospital readmission  Interventions:  Transitions of Care: 09/17/24 Patient states her stomach has bothered her over the last few days.  She reports having pain after eating on her left side. She states the left side of her abdomen feels a little hard to touch and tender. She states she continues to have nausea and has had diarrhea off and on over the last 4 days. Patient reports having a bowel movement this morning. Patient states she did not report these symptoms to her provider or oncology office  Patient reports she is scheduled to start chemotherapy on 09/19/24.      Doctor Visits  - discussed the importance of doctor visits Advised to:  Eat a low-fat diet. Do not drink alcohol.  Be safe with medicines.  Get extra rest until you feel better. Message sent to Dr. Autumn, oncology regarding patients ongoing abdominal pain/ diarrhea/ nausea symptoms.  Advised to call the oncology office today and report ongoing symptoms  Discussed and reviewed her nausea medications. Advised to take her nausea medications as prescribed.  Advised patient not to eat large/ heavy meals when feeling nauseated.  Advised to consider eating saltine crackers or dry toast, drink ginger ale. Advised to eat small frequent meals and avoid greasy, spicy or strong smelling foods. Advised to stay hydrated if possible- sips of water, ice  chips, sugar free pop sickles.   Patient Self Care Activities:  Attend all scheduled provider appointments Call pharmacy for medication refills 3-7 days in advance of running out of medications Call provider office for new concerns or questions  Notify RN Care Manager of TOC call rescheduling needs Participate in Transition of Care Program/Attend TOC scheduled calls Take medications as prescribed    call the oncology office today and report ongoing symptoms   take her nausea medications as prescribed.  Avoid eating large/ heavy meals when feeling nauseated.  consider eating saltine crackers or dry toast, drink ginger ale. Advised to eat small frequent meals and avoid greasy, spicy or strong smelling foods. stay hydrated if possible- sips of water, ice chips, sugar free pop sickle Please monitor for:   Abdominal pain  Weight loss  Yellowing of skin or eyes (jaundice)  Loss of appetite If you experience any of these please contact your doctor.      Plan:  Telephone follow up appointment with care management team member scheduled for:  09/25/24 at 11 am        Patient verbalizes understanding of instructions and care plan provided today and agrees to view in MyChart. Active MyChart status and patient understanding of how to access instructions and care plan via MyChart confirmed with patient.     The patient has been provided with contact information for the care management team and has been advised to call with any  health related questions or concerns.   Please call the care guide team at [phone] if you need to cancel or reschedule your appointment.   Please call the Suicide and Crisis Lifeline: 988 call the [place]  National Suicide Prevention Lifeline: (407)882-4769 or TTY: 562-106-3478 TTY (925)305-2376) to talk to a trained counselor call 1-800-273-TALK (toll free, 24 hour hotline) if you are experiencing a Mental Health or Behavioral Health Crisis or need someone to talk  to.  Arvin Seip RN, BSN, CCM [employer], Population Health Case Manager Phone: [phone]

## 2024-09-19 ENCOUNTER — Inpatient Hospital Stay (HOSPITAL_BASED_OUTPATIENT_CLINIC_OR_DEPARTMENT_OTHER): Admitting: Oncology

## 2024-09-19 ENCOUNTER — Inpatient Hospital Stay

## 2024-09-19 ENCOUNTER — Encounter: Payer: Self-pay | Admitting: Oncology

## 2024-09-19 ENCOUNTER — Other Ambulatory Visit: Payer: Self-pay

## 2024-09-19 ENCOUNTER — Inpatient Hospital Stay: Attending: Oncology

## 2024-09-19 ENCOUNTER — Encounter: Payer: Self-pay | Admitting: General Practice

## 2024-09-19 VITALS — BP 101/51 | HR 88 | Temp 98.2°F | Resp 16 | Wt 208.5 lb

## 2024-09-19 DIAGNOSIS — D630 Anemia in neoplastic disease: Secondary | ICD-10-CM | POA: Diagnosis not present

## 2024-09-19 DIAGNOSIS — C7B8 Other secondary neuroendocrine tumors: Secondary | ICD-10-CM | POA: Diagnosis not present

## 2024-09-19 DIAGNOSIS — Z5111 Encounter for antineoplastic chemotherapy: Secondary | ICD-10-CM | POA: Diagnosis present

## 2024-09-19 DIAGNOSIS — C7A1 Malignant poorly differentiated neuroendocrine tumors: Secondary | ICD-10-CM | POA: Diagnosis not present

## 2024-09-19 DIAGNOSIS — G893 Neoplasm related pain (acute) (chronic): Secondary | ICD-10-CM | POA: Diagnosis not present

## 2024-09-19 DIAGNOSIS — C7A8 Other malignant neuroendocrine tumors: Secondary | ICD-10-CM

## 2024-09-19 DIAGNOSIS — C252 Malignant neoplasm of tail of pancreas: Secondary | ICD-10-CM | POA: Insufficient documentation

## 2024-09-19 DIAGNOSIS — K8681 Exocrine pancreatic insufficiency: Secondary | ICD-10-CM | POA: Insufficient documentation

## 2024-09-19 DIAGNOSIS — Z79899 Other long term (current) drug therapy: Secondary | ICD-10-CM | POA: Insufficient documentation

## 2024-09-19 LAB — CBC WITH DIFFERENTIAL (CANCER CENTER ONLY)
Abs Immature Granulocytes: 0.17 K/uL — ABNORMAL HIGH (ref 0.00–0.07)
Basophils Absolute: 0.1 K/uL (ref 0.0–0.1)
Basophils Relative: 1 %
Eosinophils Absolute: 0 K/uL (ref 0.0–0.5)
Eosinophils Relative: 0 %
HCT: 36.6 % (ref 36.0–46.0)
Hemoglobin: 11.6 g/dL — ABNORMAL LOW (ref 12.0–15.0)
Immature Granulocytes: 2 %
Lymphocytes Relative: 22 %
Lymphs Abs: 1.5 K/uL (ref 0.7–4.0)
MCH: 26.7 pg (ref 26.0–34.0)
MCHC: 31.7 g/dL (ref 30.0–36.0)
MCV: 84.1 fL (ref 80.0–100.0)
Monocytes Absolute: 0.6 K/uL (ref 0.1–1.0)
Monocytes Relative: 8 %
Neutro Abs: 4.6 K/uL (ref 1.7–7.7)
Neutrophils Relative %: 67 %
Platelet Count: 260 K/uL (ref 150–400)
RBC: 4.35 MIL/uL (ref 3.87–5.11)
RDW: 13.6 % (ref 11.5–15.5)
WBC Count: 7 K/uL (ref 4.0–10.5)
nRBC: 0 % (ref 0.0–0.2)

## 2024-09-19 LAB — CMP (CANCER CENTER ONLY)
ALT: 50 U/L — ABNORMAL HIGH (ref 0–44)
AST: 57 U/L — ABNORMAL HIGH (ref 15–41)
Albumin: 4 g/dL (ref 3.5–5.0)
Alkaline Phosphatase: 574 U/L — ABNORMAL HIGH (ref 38–126)
Anion gap: 9 (ref 5–15)
BUN: 8 mg/dL (ref 6–20)
CO2: 28 mmol/L (ref 22–32)
Calcium: 9.8 mg/dL (ref 8.9–10.3)
Chloride: 102 mmol/L (ref 98–111)
Creatinine: 0.71 mg/dL (ref 0.44–1.00)
GFR, Estimated: >60 mL/min (ref >60)
Glucose, Bld: 185 mg/dL — ABNORMAL HIGH (ref 70–99)
Potassium: 3.4 mmol/L — ABNORMAL LOW (ref 3.5–5.1)
Sodium: 139 mmol/L (ref 135–145)
Total Bilirubin: 0.3 mg/dL (ref 0.0–1.2)
Total Protein: 6.9 g/dL (ref 6.5–8.1)

## 2024-09-19 LAB — MAGNESIUM: Magnesium: 1.8 mg/dL (ref 1.7–2.4)

## 2024-09-19 MED ORDER — MAGNESIUM SULFATE 2 GM/50ML IV SOLN
2.0000 g | Freq: Once | INTRAVENOUS | Status: AC
  Administered 2024-09-19: 2 g via INTRAVENOUS
  Filled 2024-09-19: qty 50

## 2024-09-19 MED ORDER — DEXAMETHASONE SOD PHOSPHATE PF 10 MG/ML IJ SOLN
10.0000 mg | Freq: Once | INTRAMUSCULAR | Status: AC
  Administered 2024-09-19: 10 mg via INTRAVENOUS

## 2024-09-19 MED ORDER — SODIUM CHLORIDE 0.9% FLUSH: 10.0000 mL | INTRAVENOUS | Status: DC | PRN

## 2024-09-19 MED ORDER — POTASSIUM CHLORIDE IN NACL 20-0.9 MEQ/L-% IV SOLN
Freq: Once | INTRAVENOUS | Status: AC
  Filled 2024-09-19: qty 1000

## 2024-09-19 MED ORDER — MORPHINE SULFATE ER 15 MG PO TBCR: 15.0000 mg | EXTENDED_RELEASE_TABLET | Freq: Two times a day (BID) | ORAL | 0 refills | Status: DC

## 2024-09-19 MED ORDER — APREPITANT 130 MG/18ML IV EMUL
130.0000 mg | Freq: Once | INTRAVENOUS | Status: AC
  Administered 2024-09-19: 130 mg via INTRAVENOUS
  Filled 2024-09-19: qty 18

## 2024-09-19 MED ORDER — SODIUM CHLORIDE 0.9 % IV SOLN: INTRAVENOUS | Status: DC

## 2024-09-19 MED ORDER — SODIUM CHLORIDE 0.9 % IV SOLN
75.0000 mg/m2 | Freq: Once | INTRAVENOUS | Status: AC
  Administered 2024-09-19: 150 mg via INTRAVENOUS
  Filled 2024-09-19: qty 150

## 2024-09-19 MED ORDER — SODIUM CHLORIDE 0.9 % IV SOLN
100.0000 mg/m2 | Freq: Once | INTRAVENOUS | Status: AC
  Administered 2024-09-19: 200 mg via INTRAVENOUS
  Filled 2024-09-19: qty 10

## 2024-09-19 MED ORDER — PALONOSETRON HCL INJECTION 0.25 MG/5ML
0.2500 mg | Freq: Once | INTRAVENOUS | Status: AC
  Administered 2024-09-19: 0.25 mg via INTRAVENOUS
  Filled 2024-09-19: qty 5

## 2024-09-19 NOTE — Assessment & Plan Note (Signed)
 Please review oncology history for additional details and timeline of events.  Biopsies from pancreatic tail lesion showed malignant cells.  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 Immunohistochemical stains were performed to characterize the tumor cells. The cells are positive for CK7, PAX8, TTF-1, synaptophysin and chromogranin A. The cells are negative for CK20, thyroglobulin, CDX2, GATA3, CK5/6, p40, D2-40, calretinin, WT1, HepPar 1, and Glypican 3. P53 is mutant type of staining. Ki67 proliferation index is approximately 50%.  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 The differential diagnosis includes metastatic high-grade neuroendocrine carcinoma from elsewhere in the body including pancreas (favored), gastrointestinal tract, lung, gynecological system, pancreatic acinar cell carcinoma or other types of carcinomas with neuroendocrine differentiation (solid pseudopapillary tumor with malignant transformation).   Previously I discussed the diagnosis, prognosis, plan of care, treatment options with the patient and her son who was accompanying.  Reviewed NCCN guidelines and discussed management options.  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  Recommended palliative systemic chemotherapy with cisplatin  and etoposide . She has previously expressed reservations about chemotherapy, preferring to rely on spiritual beliefs for healing. Without chemotherapy, the disease is expected to progress and worsen, potentially affecting other organs. Surgery is not feasible due to multiple liver metastases. She discussed this further with rest of the family members and her pastor before making final decision.  On her consultation with us  on 07/26/2024, we submitted request for staging PET  scan.  On 08/08/2024, PET scan showed multiple hepatic lesions with intense metabolic activity, progressed from July 2025, consistent with metastatic disease.  Hypermetabolic mass in the pancreatic body measuring approximately 4.3 x 2.8 cm, presumed primary malignancy.  No hypermetabolic abdominal lymph nodes.  No lung, skeletal mets.  Discussed treatment options after reviewing NCCN guidelines.  Plan made to proceed with cisplatin  and etoposide .  Cisplatin  will be given on day 1, etoposide  on days 1-3 of 21-day cycle.  Plan to continue for 6-8 cycles with restaging imaging after every 3 cycles.  We were planning to begin treatment outpatient.  However patient ended up getting hospitalized for intractable pain.  We started cycle 1 of cisplatin  and etoposide  when she was in the hospital, starting from 08/23/2024.  Tolerated cycle 1 well.  Labs today reveal no dose-limiting toxicities.  Will proceed with cycle 2-day 1 of chemotherapy today at standard dosing.  She will return for the next 2 days for chemotherapy as scheduled.  Next chemotherapy cycle will be due in approximately 3 weeks.  We will have to adjust chemotherapy schedule around Christmas holiday that week.

## 2024-09-19 NOTE — Progress Notes (Signed)
 SPIRITUAL CARE AND COUNSELING CONSULT NOTE   VISIT SUMMARY Followed up with [name] and a couple of close visitors in infusion for follow-up spiritual and emotional support. [name] is processing the loss of her hair as a challenging milestone in her treatment. Faith remains a key source of meaning, purpose, and coping.   SPIRITUAL ENCOUNTER                                                                                                                                                                      Type of Visit: Follow up Care provided to:: Pt and family Referral source: Patient request   SPIRITUAL FRAMEWORK  Presenting Themes: Values and beliefs, Meaning/purpose/sources of inspiration Values/beliefs: God has His hand on me and is keeping me Community/Connection: Family, Designer, multimedia, Faith community Strengths: strong faith/prayer life Needs/Challenges/Barriers: navigating complex illness   GOALS   Self/Personal Goals: Plans to phone chaplain as needed/desired Clinical Care Goals: Continue to provide spiritual and prayer support   INTERVENTIONS   Spiritual Care Interventions Made: Compassionate presence, Reflective listening, Normalization of emotions, Explored values/beliefs/practices/strengths, Encouragement, Supported grief process (supported grief process related to hair loss)    INTERVENTION OUTCOMES   Outcomes: Awareness around self/spiritual resourses, Connection to values and goals of care, Awareness of support, Reduced isolation  SPIRITUAL CARE PLAN   Spiritual Care Issues Still Outstanding: Chaplain will continue to follow Follow up plan : Plan to follow up in infusion; patient will phone in the interim as needed/desired    Jo Allen, Jo Allen Pager [phone] Voicemail [phone]

## 2024-09-19 NOTE — Patient Instructions (Signed)
 [facility] - A DEPT OF [facility]  Discharge Instructions: Thank you for choosing [facility] to provide your oncology and hematology care.   If you have a lab appointment with the Cancer Center, please go directly to the Cancer Center and check in at the registration area.   Wear comfortable clothing and clothing appropriate for easy access to any Portacath or PICC line.   We strive to give you quality time with your provider. You may need to reschedule your appointment if you arrive late (15 or more minutes).  Arriving late affects you and other patients whose appointments are after yours.  Also, if you miss three or more appointments without notifying the office, you may be dismissed from the clinic at the provider's discretion.      For prescription refill requests, have your pharmacy contact our office and allow 72 hours for refills to be completed.    Today you received the following chemotherapy and/or immunotherapy agents: cisplatin  and etoposide       To help prevent nausea and vomiting after your treatment, we encourage you to take your nausea medication as directed.  BELOW ARE SYMPTOMS THAT SHOULD BE REPORTED IMMEDIATELY: *FEVER GREATER THAN 100.4 F (38 C) OR HIGHER *CHILLS OR SWEATING *NAUSEA AND VOMITING THAT IS NOT CONTROLLED WITH YOUR NAUSEA MEDICATION *UNUSUAL SHORTNESS OF BREATH *UNUSUAL BRUISING OR BLEEDING *URINARY PROBLEMS (pain or burning when urinating, or frequent urination) *BOWEL PROBLEMS (unusual diarrhea, constipation, pain near the anus) TENDERNESS IN MOUTH AND THROAT WITH OR WITHOUT PRESENCE OF ULCERS (sore throat, sores in mouth, or a toothache) UNUSUAL RASH, SWELLING OR PAIN  UNUSUAL VAGINAL DISCHARGE OR ITCHING   Items with * indicate a potential emergency and should be followed up as soon as possible or go to the Emergency Department if any problems should occur.  Please show the CHEMOTHERAPY ALERT CARD or  IMMUNOTHERAPY ALERT CARD at check-in to the Emergency Department and triage nurse.  Should you have questions after your visit or need to cancel or reschedule your appointment, please contact [facility] - A DEPT OF [facility]  Dept: [phone]  and follow the prompts.  Office hours are 8:00 a.m. to 4:30 p.m. Monday - Friday. Please note that voicemails left after 4:00 p.m. may not be returned until the following business day.  We are closed weekends and major holidays. You have access to a nurse at all times for urgent questions. Please call the main number to the clinic Dept: [phone] and follow the prompts.   For any non-urgent questions, you may also contact your provider using MyChart. We now offer e-Visits for anyone 59 and older to request care online for non-urgent symptoms. For details visit [email or web address].   Also download the MyChart app! Go to the app store, search MyChart, open the app, select [facility], and log in with your MyChart username and password.

## 2024-09-19 NOTE — Progress Notes (Signed)
 [facility]  ONCOLOGY CLINIC PROGRESS NOTE   Patient Care Team: [name], [name] as PCP - General (Physician Assistant) Burnette Fallow, MD as Consulting Physician (Gastroenterology) Autumn Millman, MD as Consulting Physician (Oncology) Leath, Dionne, RN as VBCI Care Management  PATIENT NAME: Jo Allen   MR#: [id number] DOB: 06/28/1966  Date of visit: 09/19/2024   ASSESSMENT & PLAN:   Breauna Mazzeo is a 58 y.o. 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   Primary malignant neuroendocrine tumor of pancreas [facility]) Please review oncology history for additional details and timeline of events.  Biopsies from pancreatic tail lesion showed malignant cells.  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 Immunohistochemical stains were performed to characterize the tumor cells. The cells are positive for CK7, PAX8, TTF-1, synaptophysin and chromogranin A. The cells are negative for CK20, thyroglobulin, CDX2, GATA3, CK5/6, p40, D2-40, calretinin, WT1, HepPar 1, and Glypican 3. P53 is mutant type of staining. Ki67 proliferation index is approximately 50%.  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 The differential diagnosis includes metastatic high-grade neuroendocrine carcinoma from elsewhere in the body including pancreas (favored), gastrointestinal tract, lung, gynecological system, pancreatic acinar cell carcinoma or other types of carcinomas with neuroendocrine differentiation (solid pseudopapillary tumor with malignant transformation).   Previously I discussed the diagnosis, prognosis, plan of care, treatment  options with the patient and her son who was accompanying.  Reviewed NCCN guidelines and discussed management options.  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  Recommended palliative systemic chemotherapy with cisplatin  and etoposide . She has previously expressed reservations about chemotherapy, preferring to rely on spiritual beliefs for healing. Without chemotherapy, the disease is expected to progress and worsen, potentially affecting other organs. Surgery is not feasible due to multiple liver metastases. She discussed this further with rest of the family members and her pastor before making final decision.  On her consultation with us  on 07/26/2024, we submitted request for staging PET scan.  On 08/08/2024, PET scan showed multiple hepatic lesions with intense metabolic activity, progressed from July 2025, consistent with metastatic disease.  Hypermetabolic mass in the pancreatic body measuring approximately 4.3 x 2.8 cm, presumed primary malignancy.  No hypermetabolic abdominal lymph nodes.  No lung, skeletal mets.  Discussed treatment options after reviewing NCCN guidelines.  Plan made to proceed with cisplatin  and etoposide .  Cisplatin  will be given on day 1, etoposide  on days 1-3 of 21-day cycle.  Plan to continue for 6-8 cycles with restaging imaging after every 3 cycles.  We were planning to begin treatment outpatient.  However patient ended up getting hospitalized for intractable pain.  We started cycle 1 of cisplatin  and etoposide  when she was in the hospital, starting from 08/23/2024.  Tolerated cycle 1 well.  Labs today reveal no dose-limiting toxicities.  Will proceed with cycle 2-day 1 of chemotherapy today at standard dosing.  She will return for the next 2 days for chemotherapy as scheduled.  Next chemotherapy cycle will be due in approximately 3 weeks.  We will have to adjust  chemotherapy schedule around Christmas holiday that week.   Neoplasm-related pain Experiencing pain primarily on the left side and lower back, exacerbated by coughing and touch. Current oxycodone  regimen  is less effective, likely due to tolerance development. - Prescribed long-acting morphine  to be taken twice daily. - Continue oxycodone  for breakthrough pain. - Advised use of heat pad for muscle-related discomfort.  Nutritional concerns related to chemotherapy Experiencing difficulty with food intake due to inflammation and discomfort. Consuming lighter foods and juices to manage symptoms. - Consulted with a nutritionist for dietary recommendations.  Pancreatic exocrine insufficiency Managed with Creon . Clinically doing better, but pancreatic lab results are pending. - Continue Creon  as prescribed.  Anemia in neoplastic disease Hemoglobin level is 11, which is acceptable. No immediate concerns regarding anemia. - Continue to monitor hemoglobin levels.  We provided her with a letter to excuse her from work given ongoing chemotherapy and treatment-related side effects.  I reviewed lab results and outside records for this visit and discussed relevant results with the patient. Diagnosis, plan of care and treatment options were also discussed in detail with the patient. Opportunity provided to ask questions and answers provided to her apparent satisfaction. Provided instructions to call our clinic with any problems, questions or concerns prior to return visit. I recommended to continue follow-up with PCP and sub-specialists. She verbalized understanding and agreed with the plan.   NCCN guidelines have been consulted in the planning of this patient's care.  I spent a total of 42 minutes during this encounter with the patient including review of chart and various tests results, discussions about plan of care and coordination of care plan.   Chinita Patten, MD  09/19/2024 5:50 PM  [facility] - [facility] 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  INTERVAL HISTORY:    Discussed the use of AI scribe software for clinical note transcription with the patient, who gave verbal consent to proceed.  History of Present Illness Burdette Gergely is a 58 year old female who presents with left-sided abdominal and back pain post-chemotherapy.  She experiences left-sided abdominal and lower back pain, described as 'inflamed' and 'discomfort'. The pain is exacerbated by coughing and persists even at rest. She is currently taking oxycodone  for pain.  She experienced loose bowel movements for four to five days following her last chemotherapy session, with stools described as 'nothing but water'. Bowel movements occurred three times on the first day and twice on the second day.  Her dietary intake has been reduced as eating too much exacerbates her abdominal discomfort. Touching the area under her breast and across her gut is sensitive, and she has to adjust her bra to avoid irritation. No rash in the area.  She has experienced nausea and vomiting a couple of times at home. Her son has been preparing juices with vegetables like cucumbers, which she finds soothing, although she sometimes vomits after consuming them.  No burning sensation during urination or blood in urine, although urine was slightly dark for three to four days.     I have reviewed the past medical history, past surgical history, social history and family history with the patient and they are unchanged from previous note.  HISTORY OF PRESENT ILLNESS:   ONCOLOGY  HISTORY:   Patient was hospitalized when she presented to the ED on 04/19/2024 with complaints of abdominal  pain.   CT abdomen and pelvis on 04/19/2024 showed intra and Perry pancreatic edema involving the pancreatic tail with thrombosis of the splenic vein around the tail of the pancreas.  Imaging features were compatible with acute pancreatitis.  14 mm ill-defined hypoattenuating focus in the tail of the pancreas may reflect focal edema.  MRI abdomen was recommended for further evaluation.   On 04/19/2024, MRI of the abdomen showed edematous appearance of the pancreatic tail with extensive peripancreatic and retroperitoneal fat stranding, consistent with acute pancreatitis.  In the central pancreatic tail there was a focal, hypoenhancing lesion measuring 1.1 x 0.8 cm.  Pancreatic duct was focally effaced in this vicinity.  No upstream pancreatic ductal dilatation.  Numerous tiny diffusion restricting foci scattered throughout the liver, some of the largest of which are with faint associated hyperenhancement and early arterial phase.  Constellation of the findings were suspicious for small pancreatic adenocarcinoma underlying acute pancreatitis with liver metastasis.  Close follow-up was recommended.   Patient was evaluated by GI during this hospitalization.  Once her pain was adequately controlled, she was discharged on 04/27/2024 with outpatient GI follow-up.   Upper EUS on 07/04/2024, performed by Dr. Burnette.  It showed multiple 15 to 20 mm hypoechoic, round well-defined lesions in the left lobe of liver, FNA performed.  There was no significant pathology in the pancreatic head, genu of the pancreas and uncinate process of the pancreas.  Irregular hypoechoic mass was identified at the junction of pancreatic body/tail, measuring 31 mm x 22 mm.  Stage-T3, N0 by endosonographic criteria.  There was evidence of pancreatic ductal dilation.   Biopsies from pancreatic tail lesion showed malignant cells.  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 Immunohistochemical stains were performed to characterize the tumor cells. The cells are positive for CK7, PAX8, TTF-1, synaptophysin and chromogranin A. The cells are negative for CK20, thyroglobulin, CDX2, GATA3, CK5/6, p40, D2-40, calretinin, WT1, HepPar 1, and Glypican 3. P53 is mutant type of staining. Ki67 proliferation index is approximately 50%.  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 The differential diagnosis includes metastatic high-grade neuroendocrine carcinoma from elsewhere in the body including pancreas (favored), gastrointestinal tract, lung, gynecological system, pancreatic acinar cell carcinoma or other types of carcinomas with neuroendocrine differentiation (solid pseudopapillary tumor with malignant transformation). Additional workups will be performed at [facility] including INSM-1 to confirm neuroendocrine lineage, BCL10 and Chymotrypsin to exclude acinar cell carcinoma, LEF-1 and beta-catenin to exclude solid pseudopapillary tumor with malignant transformation (less likely). The results will be reported in an addendum.    Addendum # 1: Based on the morphologic and immunophenotypic features, a metastatic papillary thyroid  carcinoma cannot be excluded. Correlation with imaging findings is recommended.    Addendum # 2: Additional immunohistochemical stains were performed in outside institution to further classify the tumor. Beta-catenin is negative for nuclear stain. LEF-1 and INSM1 show weak but relatively diffuse staining.  BCL10 and trypsin show weakly nuclear and cytoplasmic staining without typical granular staining. Chymotrypsin is negative. The immunoprofile is non-specific and non-conclusive. Final classification should be rendered in the excisional specimen.   Given high proliferation index, poorly differentiated  carcinoma with neuroendocrine differentiation, concern for aggressively behaving malignancy.  Recommended palliative systemic chemotherapy with carboplatin and etoposide .  Patient has reservations against chemotherapy and is not considering chemotherapy at this time but she will discuss further with rest of the family members and her pastor before making final decision.  On her consultation with us  on 07/26/2024, we submitted request for staging PET scan.  On 08/08/2024, PET scan showed multiple hepatic lesions with intense metabolic activity, progressed from July 2025, consistent with metastatic disease.  Hypermetabolic mass in the pancreatic body measuring approximately 4.3 x 2.8 cm, presumed primary malignancy.  No hypermetabolic abdominal lymph nodes.  No lung, skeletal mets.  Patient agreeable to proceeding with systemic treatments.  Discussed treatment options after reviewing NCCN guidelines.  Plan made to proceed with cisplatin  and etoposide .  Cisplatin  will be given on day 1, etoposide  on days 1-3 of 21-day cycle.  Plan to continue for 6-8 cycles with restaging imaging after every 3 cycles.  She was admitted to the hospital for intractable pain.  We started cycle 1 of chemotherapy on 08/23/2024.  REVIEW OF SYSTEMS:   Review of Systems - Oncology  All other pertinent systems were reviewed with the patient and are negative.  ALLERGIES: She is allergic to trazodone, latex, penicillins, and sm baby powder cornstarch [powders].  MEDICATIONS:  Current Outpatient Medications  Medication Sig Dispense Refill   morphine  (MS CONTIN ) 15 MG 12 hr tablet Take 1 tablet (15 mg total) by mouth every 12 (twelve) hours. 60 tablet 0   albuterol  (VENTOLIN  HFA) 108 (90 Base) MCG/ACT inhaler Inhale 1-2 puffs into the lungs every 6 (six) hours as needed for wheezing or shortness of breath. 1 each 0   ALEVE  220 MG tablet Take 220-440 mg by mouth 2 (two) times daily as needed (for pain or headaches).      amLODipine  (NORVASC ) 10 MG tablet Take 1 tablet (10 mg total) by mouth daily. 90 tablet 1   Biotin  1000 MCG CHEW Chew 2,000 mcg by mouth daily.     buPROPion  (WELLBUTRIN  XL) 150 MG 24 hr tablet Take 150 mg by mouth daily.     Cholecalciferol (VITAMIN D3) 50 MCG (2000 UT) capsule Take 4,000 Units by mouth daily.     CRANBERRY PO Take 1 tablet by mouth daily.     dexamethasone  (DECADRON ) 4 MG tablet Take for 1 day starting the day after chemotherapy on day 4. Take with food. 30 tablet 1   ferrous sulfate  325 (65 FE) MG EC tablet Take 325 mg by mouth 3 (three) times a week.     gabapentin  (NEURONTIN ) 300 MG capsule Take 300 mg by mouth every 6 (six) hours. (Patient taking differently: Take 300 mg by mouth every 6 (six) hours as needed.)     lamoTRIgine  (LAMICTAL ) 200 MG tablet Take 200 mg by mouth 2 (two) times daily.     levocetirizine (XYZAL) 5 MG tablet Take 5 mg by mouth every evening.     lidocaine -prilocaine  (EMLA ) cream Apply 1 Application topically as needed. 30 g 0   lidocaine -prilocaine  (EMLA ) cream Apply small amount to port site 30 minutes before being accessed as directed. Place a barrier shield over site after applying to maintain medication contact with skin. 30 g 2   lipase/protease/amylase (CREON ) 36000 UNITS CPEP capsule Take 1 capsule (36,000 Units total) by mouth 3 (three) times daily with meals. 200 capsule 0   Magnesium  Gluconate (MAGNESIUM  27 PO) Take 1 tablet by mouth at bedtime.     meloxicam (MOBIC) 15 MG tablet Take 15 mg by mouth daily.     metoprolol  succinate (TOPROL  XL) 50 MG 24 hr tablet Take 1 tablet (50 mg total) by mouth daily. Take with or immediately following a meal. 90 tablet 0   nitroGLYCERIN  (NITROSTAT ) 0.4 MG  SL tablet Place 1 tablet (0.4 mg total) under the tongue every 5 (five) minutes as needed for chest pain. If you require more than two tablets five minutes apart go to the nearest ER via EMS. 30 tablet 0   ondansetron  (ZOFRAN ) 8 MG tablet Take 1 tablet (8  mg total) by mouth every 8 (eight) hours as needed for nausea or vomiting. Start on the third day after cisplatin . 30 tablet 1   oxyCODONE  (OXY IR/ROXICODONE ) 5 MG immediate release tablet Take 1 tablet (5 mg total) by mouth every 6 (six) hours as needed for moderate pain (pain score 4-6). 30 tablet 0   pantoprazole  (PROTONIX ) 40 MG tablet Take 1 tablet (40 mg total) by mouth daily. 90 tablet 0   polyethylene glycol (MIRALAX  / GLYCOLAX ) 17 g packet Take 17 g by mouth daily as needed for mild constipation. 30 each 2   prochlorperazine  (COMPAZINE ) 10 MG tablet Take 1 tablet (10 mg total) by mouth every 6 (six) hours as needed for nausea or vomiting. 30 tablet 1   QUEtiapine  (SEROQUEL ) 100 MG tablet Take 100 mg by mouth at bedtime.     rosuvastatin  (CRESTOR ) 20 MG tablet Take 20 mg by mouth at bedtime.     senna-docusate (SENOKOT-S) 8.6-50 MG tablet Take 1 tablet by mouth 2 (two) times daily. 60 tablet 2   vitamin B-12 (CYANOCOBALAMIN ) 1000 MCG tablet Take 1,000 mcg by mouth daily.     zaleplon (SONATA) 5 MG capsule Take 5 mg by mouth at bedtime.     No current facility-administered medications for this visit.   Facility-Administered Medications Ordered in Other Visits  Medication Dose Route Frequency Provider Last Rate Last Admin   0.9 %  sodium chloride  infusion   Intravenous Continuous Etola Mull, MD   Stopped at 09/19/24 1401   sodium chloride  flush (NS) 0.9 % injection 10 mL  10 mL Intracatheter PRN Hershell Brandl, MD         VITALS:   There were no vitals taken for this visit.  Wt Readings from Last 3 Encounters:  09/19/24 208 lb 8 oz (94.6 kg)  09/05/24 211 lb (95.7 kg)  08/27/24 208 lb 8.9 oz (94.6 kg)    There is no height or weight on file to calculate BMI.       PHYSICAL EXAM:   Physical Exam Constitutional:      General: She is not in acute distress.    Appearance: Normal appearance.  HENT:     Head: Normocephalic and atraumatic.  Cardiovascular:     Rate and  Rhythm: Normal rate.  Pulmonary:     Effort: Pulmonary effort is normal. No respiratory distress.  Abdominal:     General: There is no distension.  Neurological:     General: No focal deficit present.     Mental Status: She is alert and oriented to person, place, and time.  Psychiatric:        Mood and Affect: Mood normal.        Behavior: Behavior normal.       LABORATORY DATA:   I have reviewed the data as listed.  Results for orders placed or performed in visit on 09/19/24  Magnesium   Result Value Ref Range   Magnesium  1.8 1.7 - 2.4 mg/dL  CMP (Cancer Center only)  Result Value Ref Range   Sodium 139 135 - 145 mmol/L   Potassium 3.4 (L) 3.5 - 5.1 mmol/L   Chloride 102 98 - 111 mmol/L  CO2 28 22 - 32 mmol/L   Glucose, Bld 185 (H) 70 - 99 mg/dL   BUN 8 6 - 20 mg/dL   Creatinine 9.28 9.55 - 1.00 mg/dL   Calcium  9.8 8.9 - 10.3 mg/dL   Total Protein 6.9 6.5 - 8.1 g/dL   Albumin 4.0 3.5 - 5.0 g/dL   AST 57 (H) 15 - 41 U/L   ALT 50 (H) 0 - 44 U/L   Alkaline Phosphatase 574 (H) 38 - 126 U/L   Total Bilirubin 0.3 0.0 - 1.2 mg/dL   GFR, Estimated >39 >39 mL/min   Anion gap 9 5 - 15  CBC with Differential (Cancer Center Only)  Result Value Ref Range   WBC Count 7.0 4.0 - 10.5 K/uL   RBC 4.35 3.87 - 5.11 MIL/uL   Hemoglobin 11.6 (L) 12.0 - 15.0 g/dL   HCT 63.3 63.9 - 53.9 %   MCV 84.1 80.0 - 100.0 fL   MCH 26.7 26.0 - 34.0 pg   MCHC 31.7 30.0 - 36.0 g/dL   RDW 86.3 88.4 - 84.4 %   Platelet Count 260 150 - 400 K/uL   nRBC 0.0 0.0 - 0.2 %   Neutrophils Relative % 67 %   Neutro Abs 4.6 1.7 - 7.7 K/uL   Lymphocytes Relative 22 %   Lymphs Abs 1.5 0.7 - 4.0 K/uL   Monocytes Relative 8 %   Monocytes Absolute 0.6 0.1 - 1.0 K/uL   Eosinophils Relative 0 %   Eosinophils Absolute 0.0 0.0 - 0.5 K/uL   Basophils Relative 1 %   Basophils Absolute 0.1 0.0 - 0.1 K/uL   Immature Granulocytes 2 %   Abs Immature Granulocytes 0.17 (H) 0.00 - 0.07 K/uL      RADIOGRAPHIC  STUDIES:  I have personally reviewed the radiological images as listed and agree with the findings in the report.  IR IMAGING GUIDED PORT INSERTION Result Date: 08/27/2024 CLINICAL DATA:  Metastatic neuroendocrine pancreatic carcinoma and need for porta cath for chemotherapy. EXAM: IMPLANTED PORT A CATH PLACEMENT WITH ULTRASOUND AND FLUOROSCOPIC GUIDANCE ANESTHESIA/SEDATION: Moderate (conscious) sedation was employed during this procedure. A total of Versed  1.5 mg and Fentanyl  75 mcg was administered intravenously. Moderate Sedation Time: 32 minutes. The patient's level of consciousness and vital signs were monitored continuously by radiology nursing throughout the procedure under my direct supervision. FLUOROSCOPY: Radiation Exposure Index: 3.0 mGy Kerma PROCEDURE: The procedure, risks, benefits, and alternatives were explained to the patient. Questions regarding the procedure were encouraged and answered. The patient understands and consents to the procedure. A time-out was performed prior to initiating the procedure. Ultrasound was utilized to confirm patency of the right internal jugular vein. An ultrasound image was saved and recorded. The right neck and chest were prepped with chlorhexidine in a sterile fashion, and a sterile drape was applied covering the operative field. Maximum barrier sterile technique with sterile gowns and gloves were used for the procedure. Local anesthesia was provided with 1% lidocaine . After creating a small venotomy incision, a 21 gauge needle was advanced into the right internal jugular vein under direct, real-time ultrasound guidance. Ultrasound image documentation was performed. After securing guidewire access, an 8 Fr dilator was placed. A J-wire was kinked to measure appropriate catheter length. A subcutaneous port pocket was then created along the upper chest wall utilizing sharp and blunt dissection. Portable cautery was utilized. The pocket was irrigated with sterile  saline. A single lumen power injectable port was chosen for placement. The 8  Fr catheter was tunneled from the port pocket site to the venotomy incision. The port was placed in the pocket. The port was secured in the pocket with two separate 2-0 Ethilon tacking sutures. External catheter was trimmed to appropriate length based on guidewire measurement. At the venotomy, an 8 Fr peel-away sheath was placed over a guidewire. The catheter was then placed through the sheath and the sheath removed. Final catheter positioning was confirmed and documented with a fluoroscopic spot image. The port was accessed with a needle and aspirated and flushed with heparinized saline. The access needle was removed. The venotomy and port pocket incisions were closed with subcutaneous 3-0 Monocryl and subcuticular 4-0 Vicryl. Dermabond was applied to both incisions. COMPLICATIONS: COMPLICATIONS None FINDINGS: After catheter placement, the tip lies at the cavo-atrial junction. The catheter aspirates normally and is ready for immediate use. IMPRESSION: Placement of single lumen port a cath via right internal jugular vein. The catheter tip lies at the cavo-atrial junction. A power injectable port a cath was placed and is ready for immediate use. Electronically Signed   By: Marcey Moan M.D.   On: 08/27/2024 16:48   CT ABDOMEN PELVIS W CONTRAST Result Date: 08/21/2024 EXAM: CT ABDOMEN AND PELVIS WITH CONTRAST 08/21/2024 01:01:53 AM TECHNIQUE: CT of the abdomen and pelvis was performed with the administration of 100 mL of iohexol  (OMNIPAQUE ) 300 MG/ML solution. Multiplanar reformatted images are provided for review. Automated exposure control, iterative reconstruction, and/or weight-based adjustment of the mA/kV was utilized to reduce the radiation dose to as low as reasonably achievable. COMPARISON: 04/19/2024, PET CT 08/08/24 CLINICAL HISTORY: Abdominal pain, acute, nonlocalized; RUQ, epigastric TTP, hx pancreatic cancer with mets to  liver, starting chemo soon. FINDINGS: LOWER CHEST: No acute abnormality. LIVER: Numerous low density masses throughout the liver compatible with metastases. Largest mass centrally in the liver measures up to 4.1 cm. GALLBLADDER AND BILE DUCTS: Gallbladder is unremarkable. No biliary ductal dilatation. SPLEEN: No acute abnormality. PANCREAS: Low density ill defined mass in the pancreatic body measures 2.9 cm. This is compatible with the patient's known pancreatic cancer . ADRENAL GLANDS: No acute abnormality. KIDNEYS, URETERS AND BLADDER: No stones in the kidneys or ureters. No hydronephrosis. No perinephric or periureteral stranding. Urinary bladder is unremarkable. GI AND BOWEL: Stomach demonstrates no acute abnormality. There is no bowel obstruction. PERITONEUM AND RETROPERITONEUM: No ascites. No free air. VASCULATURE: Aorta is normal in caliber. LYMPH NODES: No lymphadenopathy. REPRODUCTIVE ORGANS: No acute abnormality. BONES AND SOFT TISSUES: No acute osseous abnormality. No focal soft tissue abnormality. IMPRESSION: 1. Numerous hepatic metastases, largest 4.1 cm; progressive metastatic disease. 2. Pancreatic body mass 2.9 cm corresponding to known pancreatic cancer . Electronically signed by: Franky Crease MD 08/21/2024 01:29 AM EST RP Workstation: [id number]     CODE STATUS:  Code Status History     Date Active Date Inactive Code Status Order ID Comments User Context   04/19/2024 1025 04/27/2024 1655 Full Code [id number]  Zella Katha HERO, MD ED   04/29/2023 0847 04/30/2023 1450 Full Code [id number]  Ladona Heinz, MD Inpatient    Questions for Most Recent Historical Code Status (Order [id number])     Question Answer   By: Consent: discussion documented in EHR            Orders Placed This Encounter  Procedures   CBC with Differential (Cancer Center Only)    Standing Status:   Future    Expected Date:   10/31/2024    Expiration Date:   10/31/2025  CMP (Cancer Center only)    Standing Status:    Future    Expected Date:   10/31/2024    Expiration Date:   10/31/2025   Magnesium     Standing Status:   Future    Expected Date:   10/31/2024    Expiration Date:   10/31/2025     Future Appointments  Date Time Provider Department Center  09/20/2024  2:00 PM [facility] None  09/21/2024 10:30 AM [facility] IN[facility] None  09/25/2024 11:00 AM Elridge Commander, RN [facility] None      This document was completed utilizing speech recognition software. Grammatical errors, random word insertions, pronoun errors, and incomplete sentences are an occasional consequence of this system due to software limitations, ambient noise, and hardware issues. Any formal questions or concerns about the content, text or information contained within the body of this dictation should be directly addressed to the provider for clarification.

## 2024-09-20 ENCOUNTER — Inpatient Hospital Stay

## 2024-09-20 VITALS — BP 139/77 | HR 100 | Temp 98.1°F | Resp 16

## 2024-09-20 DIAGNOSIS — C7A8 Other malignant neuroendocrine tumors: Secondary | ICD-10-CM

## 2024-09-20 DIAGNOSIS — F410 Panic disorder [episodic paroxysmal anxiety] without agoraphobia: Secondary | ICD-10-CM | POA: Diagnosis not present

## 2024-09-20 DIAGNOSIS — Z5111 Encounter for antineoplastic chemotherapy: Secondary | ICD-10-CM | POA: Diagnosis not present

## 2024-09-20 DIAGNOSIS — F3132 Bipolar disorder, current episode depressed, moderate: Secondary | ICD-10-CM | POA: Diagnosis not present

## 2024-09-20 MED ORDER — SODIUM CHLORIDE 0.9% FLUSH: 10.0000 mL | INTRAVENOUS | Status: DC | PRN

## 2024-09-20 MED ORDER — SODIUM CHLORIDE 0.9 % IV SOLN
100.0000 mg/m2 | Freq: Once | INTRAVENOUS | Status: AC
  Administered 2024-09-20: 200 mg via INTRAVENOUS
  Filled 2024-09-20: qty 10

## 2024-09-20 MED ORDER — DEXAMETHASONE SOD PHOSPHATE PF 10 MG/ML IJ SOLN
10.0000 mg | Freq: Once | INTRAMUSCULAR | Status: AC
  Administered 2024-09-20: 10 mg via INTRAVENOUS

## 2024-09-20 MED ORDER — SODIUM CHLORIDE 0.9 % IV SOLN: INTRAVENOUS | Status: DC

## 2024-09-20 NOTE — Patient Instructions (Signed)
 [facility] - A DEPT OF [facility]  Discharge Instructions: Thank you for choosing [facility] to provide your oncology and hematology care.   If you have a lab appointment with the Cancer Center, please go directly to the Cancer Center and check in at the registration area.   Wear comfortable clothing and clothing appropriate for easy access to any Portacath or PICC line.   We strive to give you quality time with your provider. You may need to reschedule your appointment if you arrive late (15 or more minutes).  Arriving late affects you and other patients whose appointments are after yours.  Also, if you miss three or more appointments without notifying the office, you may be dismissed from the clinic at the provider's discretion.      For prescription refill requests, have your pharmacy contact our office and allow 72 hours for refills to be completed.    Today you received the following chemotherapy and/or immunotherapy agents: etoposide      To help prevent nausea and vomiting after your treatment, we encourage you to take your nausea medication as directed.  BELOW ARE SYMPTOMS THAT SHOULD BE REPORTED IMMEDIATELY: *FEVER GREATER THAN 100.4 F (38 C) OR HIGHER *CHILLS OR SWEATING *NAUSEA AND VOMITING THAT IS NOT CONTROLLED WITH YOUR NAUSEA MEDICATION *UNUSUAL SHORTNESS OF BREATH *UNUSUAL BRUISING OR BLEEDING *URINARY PROBLEMS (pain or burning when urinating, or frequent urination) *BOWEL PROBLEMS (unusual diarrhea, constipation, pain near the anus) TENDERNESS IN MOUTH AND THROAT WITH OR WITHOUT PRESENCE OF ULCERS (sore throat, sores in mouth, or a toothache) UNUSUAL RASH, SWELLING OR PAIN  UNUSUAL VAGINAL DISCHARGE OR ITCHING   Items with * indicate a potential emergency and should be followed up as soon as possible or go to the Emergency Department if any problems should occur.  Please show the CHEMOTHERAPY ALERT CARD or IMMUNOTHERAPY  ALERT CARD at check-in to the Emergency Department and triage nurse.  Should you have questions after your visit or need to cancel or reschedule your appointment, please contact [facility] - A DEPT OF [facility]  Dept: [phone]  and follow the prompts.  Office hours are 8:00 a.m. to 4:30 p.m. Monday - Friday. Please note that voicemails left after 4:00 p.m. may not be returned until the following business day.  We are closed weekends and major holidays. You have access to a nurse at all times for urgent questions. Please call the main number to the clinic Dept: [phone] and follow the prompts.   For any non-urgent questions, you may also contact your provider using MyChart. We now offer e-Visits for anyone 40 and older to request care online for non-urgent symptoms. For details visit [email or web address].   Also download the MyChart app! Go to the app store, search "MyChart", open the app, select [place], and log in with your MyChart username and password.

## 2024-09-21 ENCOUNTER — Other Ambulatory Visit: Payer: Self-pay | Admitting: Oncology

## 2024-09-21 ENCOUNTER — Other Ambulatory Visit (HOSPITAL_COMMUNITY): Payer: Self-pay

## 2024-09-21 ENCOUNTER — Encounter: Payer: Self-pay | Admitting: Oncology

## 2024-09-21 ENCOUNTER — Telehealth: Payer: Self-pay

## 2024-09-21 ENCOUNTER — Inpatient Hospital Stay

## 2024-09-21 VITALS — BP 135/68 | HR 96 | Temp 98.0°F | Resp 18

## 2024-09-21 DIAGNOSIS — Z5111 Encounter for antineoplastic chemotherapy: Secondary | ICD-10-CM | POA: Diagnosis not present

## 2024-09-21 DIAGNOSIS — C7A8 Other malignant neuroendocrine tumors: Secondary | ICD-10-CM

## 2024-09-21 MED ORDER — MORPHINE SULFATE ER 15 MG PO TBCR: 15.0000 mg | EXTENDED_RELEASE_TABLET | Freq: Two times a day (BID) | ORAL | 0 refills | Status: DC

## 2024-09-21 MED ORDER — DEXAMETHASONE SOD PHOSPHATE PF 10 MG/ML IJ SOLN
10.0000 mg | Freq: Once | INTRAMUSCULAR | Status: AC
  Administered 2024-09-21: 10 mg via INTRAVENOUS

## 2024-09-21 MED ORDER — SODIUM CHLORIDE 0.9 % IV SOLN: INTRAVENOUS | Status: DC

## 2024-09-21 MED ORDER — SODIUM CHLORIDE 0.9 % IV SOLN
100.0000 mg/m2 | Freq: Once | INTRAVENOUS | Status: AC
  Administered 2024-09-21: 200 mg via INTRAVENOUS
  Filled 2024-09-21: qty 10

## 2024-09-21 NOTE — Telephone Encounter (Signed)
 Oral Oncology Patient Advocate Encounter   Received notification that prior authorization for morphine  (MS CONTIN ) 15 MG 12 hr tablet  is required.   PA submitted on 09/21/24 Key [id number] Status is pending     Lucie Lamer, CPhT [place]  [facility] Oncology Pharmacy Patient Advocate Specialist [facility] [place] Phone: [phone]  Fax: [phone] [email or web address]

## 2024-09-21 NOTE — Patient Instructions (Signed)
 [facility] - A DEPT OF [facility]  Discharge Instructions: Thank you for choosing [facility] to provide your oncology and hematology care.   If you have a lab appointment with the Cancer Center, please go directly to the Cancer Center and check in at the registration area.   Wear comfortable clothing and clothing appropriate for easy access to any Portacath or PICC line.   We strive to give you quality time with your provider. You may need to reschedule your appointment if you arrive late (15 or more minutes).  Arriving late affects you and other patients whose appointments are after yours.  Also, if you miss three or more appointments without notifying the office, you may be dismissed from the clinic at the provider's discretion.      For prescription refill requests, have your pharmacy contact our office and allow 72 hours for refills to be completed.    Today you received the following chemotherapy and/or immunotherapy agents: etoposide      To help prevent nausea and vomiting after your treatment, we encourage you to take your nausea medication as directed.  BELOW ARE SYMPTOMS THAT SHOULD BE REPORTED IMMEDIATELY: *FEVER GREATER THAN 100.4 F (38 C) OR HIGHER *CHILLS OR SWEATING *NAUSEA AND VOMITING THAT IS NOT CONTROLLED WITH YOUR NAUSEA MEDICATION *UNUSUAL SHORTNESS OF BREATH *UNUSUAL BRUISING OR BLEEDING *URINARY PROBLEMS (pain or burning when urinating, or frequent urination) *BOWEL PROBLEMS (unusual diarrhea, constipation, pain near the anus) TENDERNESS IN MOUTH AND THROAT WITH OR WITHOUT PRESENCE OF ULCERS (sore throat, sores in mouth, or a toothache) UNUSUAL RASH, SWELLING OR PAIN  UNUSUAL VAGINAL DISCHARGE OR ITCHING   Items with * indicate a potential emergency and should be followed up as soon as possible or go to the Emergency Department if any problems should occur.  Please show the CHEMOTHERAPY ALERT CARD or IMMUNOTHERAPY  ALERT CARD at check-in to the Emergency Department and triage nurse.  Should you have questions after your visit or need to cancel or reschedule your appointment, please contact [facility] - A DEPT OF [facility]  Dept: [phone]  and follow the prompts.  Office hours are 8:00 a.m. to 4:30 p.m. Monday - Friday. Please note that voicemails left after 4:00 p.m. may not be returned until the following business day.  We are closed weekends and major holidays. You have access to a nurse at all times for urgent questions. Please call the main number to the clinic Dept: [phone] and follow the prompts.   For any non-urgent questions, you may also contact your provider using MyChart. We now offer e-Visits for anyone 40 and older to request care online for non-urgent symptoms. For details visit [email or web address].   Also download the MyChart app! Go to the app store, search "MyChart", open the app, select [place], and log in with your MyChart username and password.

## 2024-09-21 NOTE — Telephone Encounter (Signed)
 Oral Oncology Patient Advocate Encounter  Prior Authorization for morphine  (MS CONTIN ) 15 MG 12 hr tablet  has been approved.    PA# [id number] Effective dates: 09/21/24 through 09/22/25  [facility] pharmacy to let them know.   Lucie Lamer, CPhT [place]  [facility] Services Oncology Pharmacy Patient Advocate Specialist [facility] [place] Phone: [phone]  Fax: [phone] [email or web address]

## 2024-09-25 ENCOUNTER — Telehealth: Payer: Self-pay

## 2024-09-26 ENCOUNTER — Telehealth: Payer: Self-pay

## 2024-09-26 ENCOUNTER — Other Ambulatory Visit: Payer: Self-pay | Admitting: Oncology

## 2024-09-26 DIAGNOSIS — G893 Neoplasm related pain (acute) (chronic): Secondary | ICD-10-CM

## 2024-09-26 MED ORDER — OXYCODONE HCL ER 10 MG PO T12A: 10.0000 mg | EXTENDED_RELEASE_TABLET | Freq: Two times a day (BID) | ORAL | 0 refills | Status: DC

## 2024-09-26 MED ORDER — GABAPENTIN 300 MG PO CAPS: 300.0000 mg | ORAL_CAPSULE | Freq: Three times a day (TID) | ORAL | 3 refills | Status: AC

## 2024-09-26 MED ORDER — OXYCODONE HCL 10 MG PO TABS: 10.0000 mg | ORAL_TABLET | Freq: Four times a day (QID) | ORAL | 0 refills | Status: DC | PRN

## 2024-09-26 NOTE — Transitions of Care (Post Inpatient/ED Visit) (Signed)
 Transition of Care Week 5  Visit Note  09/26/2024  Name: Jo Allen MRN: [id number]          DOB: [birth year]  Situation: Patient enrolled in [facility] 30-day program. Visit completed with patient by telephone.   Background:   Initial Transition Care Management Follow-up Telephone Call Discharge Date and Diagnosis: 08/26/24, Acute Pancreatitis   Past Medical History:  Diagnosis Date   Acute pancreatitis 04/19/2024   Asthma    CAD (coronary artery disease)    Cancer (HCC)    GERD (gastroesophageal reflux disease)    Headache    Heart disease    Hypertension    Obesity    Paresthesia 08/20/2015   Prediabetes    Transfusion history    14yrs ago    Assessment: Patient Reported Symptoms: Cognitive Cognitive Status: Alert and oriented to person, place, and time, Normal speech and language skills      Neurological Neurological Review of Symptoms: No symptoms reported    HEENT HEENT Symptoms Reported: No symptoms reported      Cardiovascular Cardiovascular Symptoms Reported: No symptoms reported    Respiratory Respiratory Symptoms Reported: No symptoms reported    Endocrine Endocrine Symptoms Reported: No symptoms reported    Gastrointestinal Gastrointestinal Symptoms Reported: Abdominal pain or discomfort Additional Gastrointestinal Details: Reports continued pain to left side of abdomen.  Taking oxycodone  as needed. She tried the MsContin but does not like it. Gastrointestinal Management Strategies: Medication therapy Gastrointestinal Self-Management Outcome: 3 (uncertain) Gastrointestinal Comment: Pancreatic Cancer/ chemo 2nd round completed last week.  No schedule yet for next round    Genitourinary Genitourinary Symptoms Reported: No symptoms reported    Integumentary Integumentary Symptoms Reported: Other Additional Integumentary Details: Right sided portacath for chemo Skin Self-Management Outcome: 4 (good)  Musculoskeletal Musculoskelatal Symptoms Reviewed: No  symptoms reported        Psychosocial Psychosocial Symptoms Reported: No symptoms reported         There were no vitals filed for this visit. Pain Scale: 0-10 Pain Score: 5  Pain Type: Acute pain Pain Location: Abdomen Pain Orientation: Left, Right, Lower Pain Descriptors / Indicators: Discomfort Pain Onset: On-going Patients Stated Pain Goal: 0 Pain Intervention(s): Medication (See eMAR)  Medications Reviewed Today     Reviewed by Lindberg Zenon, RN (Case Manager) on 09/26/24 at 1334  Med List Status: <None>   Medication Order Taking? Sig Documenting Provider Last Dose Status Informant  albuterol  (VENTOLIN  HFA) 108 (90 Base) MCG/ACT inhaler [id number]  Inhale 1-2 puffs into the lungs every 6 (six) hours as needed for wheezing or shortness of breath. Billy Asberry FALCON, PA-C  Active Self  ALEVE  220 MG tablet [id number]  Take 220-440 mg by mouth 2 (two) times daily as needed (for pain or headaches). [provider]  Active Self  amLODipine  (NORVASC ) 10 MG tablet [id number]  Take 1 tablet (10 mg total) by mouth daily. Ladona Heinz, MD  Active Self  Biotin  1000 MCG CHEW [id number]  Chew 2,000 mcg by mouth daily. [provider]  Active Self  buPROPion  (WELLBUTRIN  XL) 150 MG 24 hr tablet [id number]  Take 150 mg by mouth daily. [provider]  Active Self           Med Note [name], [name]   Wed Aug 22, 2024  8:46 PM)    Cholecalciferol (VITAMIN D3) 50 MCG (2000 UT) capsule [id number]  Take 4,000 Units by mouth daily. [provider]  Active Self  CRANBERRY PO [id number]  Take 1 tablet  by mouth daily. [provider]  Active Self           Med Note [name], NATHANEL LOISE Schaumann Aug 23, 2024  2:41 PM) Strength not stated  dexamethasone  (DECADRON ) 4 MG tablet [id number]  Take for 1 day starting the day after chemotherapy on day 4. Take with food. Autumn Millman, MD  Active Self           Med Note [name], [name]   Mon Aug 27, 2024 12:39 PM) Has not started yet   ferrous sulfate  325 (65 FE) MG EC tablet [id number]  Take 325 mg by mouth 3 (three) times a week. [provider]  Active Self  gabapentin  (NEURONTIN ) 300 MG capsule [id number]  Take 300 mg by mouth every 6 (six) hours.  Patient taking differently: Take 300 mg by mouth every 6 (six) hours as needed.   [provider]  Active Self  lamoTRIgine  (LAMICTAL ) 200 MG tablet [id number]  Take 200 mg by mouth 2 (two) times daily. [provider]  Active Self  levocetirizine (XYZAL) 5 MG tablet [id number]  Take 5 mg by mouth every evening. [provider]  Active Self  lidocaine -prilocaine  (EMLA ) cream [id number]  Apply 1 Application topically as needed. Pasam, Millman, MD  Active   lidocaine -prilocaine  (EMLA ) cream [id number]  Apply small amount to port site 30 minutes before being accessed as directed. Place a barrier shield over site after applying to maintain medication contact with skin. Pasam, Millman, MD  Active   lipase/protease/amylase (CREON ) 36000 UNITS CPEP capsule [id number]  Take 1 capsule (36,000 Units total) by mouth 3 (three) times daily with meals. Pasam, Millman, MD  Active   Magnesium  Gluconate (MAGNESIUM  27 PO) [id number]  Take 1 tablet by mouth at bedtime. [provider]  Active Self  meloxicam (MOBIC) 15 MG tablet [id number]  Take 15 mg by mouth daily. [provider]  Active Self  metoprolol  succinate (TOPROL  XL) 50 MG 24 hr tablet [id number]  Take 1 tablet (50 mg total) by mouth daily. Take with or immediately following a meal. [place], Camellia PARAS, DO  Active   morphine  (MS CONTIN ) 15 MG 12 hr tablet [id number] Yes Take 1 tablet (15 mg total) by mouth every 12 (twelve) hours. Pasam, Millman, MD  Active   nitroGLYCERIN  (NITROSTAT ) 0.4 MG SL tablet [id number] Yes Place 1 tablet (0.4 mg total) under the tongue every 5 (five) minutes as needed for chest pain. If you require more than two tablets five minutes apart go to the nearest ER via EMS. [place],  Camellia PARAS, DO  Active            Med Note [name], [name]   Mon Aug 27, 2024 12:37 PM) Has not needed  ondansetron  (ZOFRAN ) 8 MG tablet [id number] Yes Take 1 tablet (8 mg total) by mouth every 8 (eight) hours as needed for nausea or vomiting. Start on the third day after cisplatin . [place], Camellia PARAS, DO  Active            Med Note [name], [name]   Mon Aug 27, 2024 12:37 PM) Has not started yet  oxyCODONE  (OXY IR/ROXICODONE ) 5 MG immediate release tablet [id number] Yes Take 1 tablet (5 mg total) by mouth every 6 (six) hours as needed for moderate pain (pain score 4-6). Pasam, Millman, MD  Active   pantoprazole  (PROTONIX ) 40 MG tablet [id number]  Take 1 tablet (40 mg total) by mouth daily. [place], Eric J, DO  Active  polyethylene glycol (MIRALAX  / GLYCOLAX ) 17 g packet [id number] Yes Take 17 g by mouth daily as needed for mild constipation. [place], Camellia PARAS, DO  Active   prochlorperazine  (COMPAZINE ) 10 MG tablet [id number] Yes Take 1 tablet (10 mg total) by mouth every 6 (six) hours as needed for nausea or vomiting. Autumn Millman, MD  Active Self           Med Note [name], [name]   Mon Aug 27, 2024 12:38 PM) Has not started yet  QUEtiapine  (SEROQUEL ) 100 MG tablet [id number] Yes Take 100 mg by mouth at bedtime. [provider]  Active Self  rosuvastatin  (CRESTOR ) 20 MG tablet [id number] Yes Take 20 mg by mouth at bedtime. [provider]  Active Self  senna-docusate (SENOKOT-S) 8.6-50 MG tablet [id number] Yes Take 1 tablet by mouth 2 (two) times daily. [place], Camellia PARAS, DO  Active   vitamin B-12 (CYANOCOBALAMIN ) 1000 MCG tablet [id number] Yes Take 1,000 mcg by mouth daily. [provider]  Active Self  zaleplon (SONATA) 5 MG capsule [id number] Yes Take 5 mg by mouth at bedtime. [provider]  Active Self            Recommendation:   Continue Current Plan of Care  Follow Up Plan:   Telephone follow-up in 1 week  Mattalynn Crandle J. Aubry Rankin RN, MSN [facility], [facility] RN Care Manager Direct Dial: [phone]  Fax: [phone] Website: [email or web address]

## 2024-09-26 NOTE — Patient Instructions (Signed)
 Visit Information  Thank you for taking time to visit with me today. Please don't hesitate to contact me if I can be of assistance to you before our next scheduled telephone appointment.  Our next appointment is by telephone on 10/03/24 at 130 pm  Following is a copy of your care plan:   Goals Addressed             This Visit's Progress    VBCI Transitions of Care (TOC) Care Plan       Problems:  Recent Hospitalization for treatment of Acute Pancreatitis Knowledge Deficit Related to Acute Pancreatitis and Pancreatic Cancer  Goal:  Over the next 30 days, the patient will not experience hospital readmission  Interventions:  Transitions of Care: 12/10 /25 Spoke with patient she reports she is making it.  She states she has abdominal pain  especially after eating.  She states she has MSContin but she does not like the way she feels.  She states she has a headache after taking it and that it bothers her breathing.  She states she will call the cancer center to possibly have oxycodone  PRN dose increased and not take the MSContin.   RN CM will also message the oncologist.  Rates pain in abdomen 5-6/10 today and has taken the oxycodone  today but needs refills.  Next Chemo not scheduled yet per patient.        Doctor Visits  - discussed the importance of doctor visits Advised to:  Eat a low-fat diet. Do not drink alcohol.  Be safe with medicines.  Get extra rest until you feel better.  Patient Self Care Activities:  Attend all scheduled provider appointments Call pharmacy for medication refills 3-7 days in advance of running out of medications Call provider office for new concerns or questions  Notify RN Care Manager of TOC call rescheduling needs Participate in Transition of Care Program/Attend TOC scheduled calls Take medications as prescribed   Avoid eating large/ heavy meals when feeling nauseated.  consider eating saltine crackers or dry toast, drink ginger ale. Advised to eat  small frequent meals and avoid greasy, spicy or strong smelling foods. stay hydrated if possible- sips of water, ice chips, sugar free popsicles Please monitor for:   Abdominal pain  Weight loss  Yellowing of skin or eyes (jaundice)  Loss of appetite If you experience any of these please contact your doctor.      Plan:  The patient has been provided with contact information for the care management team and has been advised to call with any health related questions or concerns.         Patient verbalizes understanding of instructions and care plan provided today and agrees to view in MyChart. Active MyChart status and patient understanding of how to access instructions and care plan via MyChart confirmed with patient.     The patient has been provided with contact information for the care management team and has been advised to call with any health related questions or concerns.   Please call the care guide team at [phone] if you need to cancel or reschedule your appointment.   Please call the Suicide and Crisis Lifeline: 988 call the [place]  National Suicide Prevention Lifeline: (551)104-8818 or TTY: 6095382746 TTY 403-086-6197) to talk to a trained counselor if you are experiencing a Mental Health or Behavioral Health Crisis or need someone to talk to.  Nakyra Bourn J. Pollyann Roa RN, MSN [place]  [place], [facility] RN Care Manager Direct  Dial: [phone]  Fax: [phone] Website: [email or web address]

## 2024-09-28 ENCOUNTER — Other Ambulatory Visit: Payer: Self-pay

## 2024-10-03 ENCOUNTER — Telehealth: Payer: Self-pay | Admitting: Oncology

## 2024-10-03 ENCOUNTER — Encounter: Payer: Self-pay | Admitting: General Practice

## 2024-10-03 ENCOUNTER — Other Ambulatory Visit: Payer: Self-pay

## 2024-10-03 DIAGNOSIS — K8689 Other specified diseases of pancreas: Secondary | ICD-10-CM

## 2024-10-03 NOTE — Telephone Encounter (Signed)
 I spoke with patient and she is aware of all scheduled appointments.

## 2024-10-03 NOTE — Transitions of Care (Post Inpatient/ED Visit) (Signed)
 Transition of Care Week 6  Visit Note  10/03/2024  Name: Jo Allen MRN: [id number]          DOB: 1966-03-15  Situation: Patient enrolled in [facility] 30-day program. Visit completed with Patient Jo Allen by telephone.   Background:   Initial Transition Care Management Follow-up Telephone Call Discharge Date and Diagnosis: 08/26/24 Pancreatitis/Pancreatic Cancer  Past Medical History:  Diagnosis Date   Acute pancreatitis 04/19/2024   Asthma    CAD (coronary artery disease)    Cancer (HCC)    GERD (gastroesophageal reflux disease)    Headache    Heart disease    Hypertension    Obesity    Paresthesia 08/20/2015   Prediabetes    Transfusion history    47yrs ago    Assessment: Patient Reported Symptoms: Cognitive Cognitive Status: Alert and oriented to person, place, and time, Normal speech and language skills      Neurological Neurological Review of Symptoms: No symptoms reported    HEENT HEENT Symptoms Reported: No symptoms reported      Cardiovascular Cardiovascular Symptoms Reported: No symptoms reported    Respiratory Respiratory Symptoms Reported: No symptoms reported    Endocrine Endocrine Symptoms Reported: No symptoms reported    Gastrointestinal Gastrointestinal Symptoms Reported: No symptoms reported      Genitourinary Genitourinary Symptoms Reported: No symptoms reported    Integumentary Integumentary Symptoms Reported: Other Additional Integumentary Details: Right sided portacath for chemo Skin Management Strategies: Routine screening Skin Self-Management Outcome: 4 (good)  Musculoskeletal Musculoskelatal Symptoms Reviewed: No symptoms reported   Falls in the past year?: No    Psychosocial Psychosocial Symptoms Reported: No symptoms reported         There were no vitals filed for this visit. Pain Scale: 0-10 Pain Score: 0-No pain  Medications Reviewed Today     Reviewed by Kameka Whan, RN (Case Manager) on 10/03/24 at 1423  Med  List Status: <None>   Medication Order Taking? Sig Documenting Provider Last Dose Status Informant  albuterol  (VENTOLIN  HFA) 108 (90 Base) MCG/ACT inhaler [id number] Yes Inhale 1-2 puffs into the lungs every 6 (six) hours as needed for wheezing or shortness of breath. Billy Asberry FALCON, PA-C  Active Self  ALEVE  220 MG tablet [id number] Yes Take 220-440 mg by mouth 2 (two) times daily as needed (for pain or headaches). [provider]  Active Self  amLODipine  (NORVASC ) 10 MG tablet [id number] Yes Take 1 tablet (10 mg total) by mouth daily. Ladona Heinz, MD  Active Self  Biotin  1000 MCG CHEW [id number] Yes Chew 2,000 mcg by mouth daily. [provider]  Active Self  buPROPion  (WELLBUTRIN  XL) 150 MG 24 hr tablet [id number] Yes Take 150 mg by mouth daily. [provider]  Active Self           Med Note [name], [name]   Wed Aug 22, 2024  8:46 PM)    Cholecalciferol (VITAMIN D3) 50 MCG (2000 UT) capsule [id number] Yes Take 4,000 Units by mouth daily. [provider]  Active Self  CRANBERRY PO [id number] Yes Take 1 tablet by mouth daily. [provider]  Active Self           Med Note [name], [name] Schaumann Aug 23, 2024  2:41 PM) Strength not stated  dexamethasone  (DECADRON ) 4 MG tablet [id number] Yes Take for 1 day starting the day after chemotherapy on day 4. Take with food. Autumn Millman, MD  Active Self  Med Note [name], [name]   Mon Aug 27, 2024 12:39 PM) Has not started yet  ferrous sulfate  325 (65 FE) MG EC tablet [id number] Yes Take 325 mg by mouth 3 (three) times a week. [provider]  Active Self  gabapentin  (NEURONTIN ) 300 MG capsule [id number] Yes Take 1 capsule (300 mg total) by mouth 3 (three) times daily. Pasam, Chinita, MD  Active   lamoTRIgine  (LAMICTAL ) 200 MG tablet [id number] Yes Take 200 mg by mouth 2 (two) times daily. [provider]  Active Self  levocetirizine (XYZAL) 5 MG tablet [id number] Yes Take 5 mg by mouth every  evening. [provider]  Active Self  lidocaine -prilocaine  (EMLA ) cream [id number] Yes Apply 1 Application topically as needed. Pasam, Chinita, MD  Active   lidocaine -prilocaine  (EMLA ) cream [id number] Yes Apply small amount to port site 30 minutes before being accessed as directed. Place a barrier shield over site after applying to maintain medication contact with skin. Pasam, Chinita, MD  Active   lipase/protease/amylase (CREON ) 36000 UNITS CPEP capsule [id number] Yes Take 1 capsule (36,000 Units total) by mouth 3 (three) times daily with meals. Pasam, Chinita, MD  Active   Magnesium  Gluconate (MAGNESIUM  27 PO) [id number] Yes Take 1 tablet by mouth at bedtime. [provider]  Active Self  meloxicam (MOBIC) 15 MG tablet [id number] Yes Take 15 mg by mouth daily. [provider]  Active Self  metoprolol  succinate (TOPROL  XL) 50 MG 24 hr tablet [id number] Yes Take 1 tablet (50 mg total) by mouth daily. Take with or immediately following a meal. [place], Camellia PARAS, DO  Active   nitroGLYCERIN  (NITROSTAT ) 0.4 MG SL tablet [id number] Yes Place 1 tablet (0.4 mg total) under the tongue every 5 (five) minutes as needed for chest pain. If you require more than two tablets five minutes apart go to the nearest ER via EMS. [place], Camellia PARAS, DO  Active            Med Note [name], [name]   Mon Aug 27, 2024 12:37 PM) Has not needed  ondansetron  (ZOFRAN ) 8 MG tablet [id number] Yes Take 1 tablet (8 mg total) by mouth every 8 (eight) hours as needed for nausea or vomiting. Start on the third day after cisplatin . [place], Camellia PARAS, DO  Active            Med Note [name], [name]   Mon Aug 27, 2024 12:37 PM) Has not started yet  oxyCODONE  (OXYCONTIN ) 10 mg 12 hr tablet [id number] Yes Take 1 tablet (10 mg total) by mouth every 12 (twelve) hours. Pasam, Chinita, MD  Active   oxyCODONE  10 MG TABS [id number] Yes Take 1 tablet (10 mg total) by mouth every 6 (six) hours as needed for moderate pain (pain score  4-6). Pasam, Chinita, MD  Active   pantoprazole  (PROTONIX ) 40 MG tablet [id number] Yes Take 1 tablet (40 mg total) by mouth daily. [place], Camellia PARAS, DO  Active   polyethylene glycol (MIRALAX  / GLYCOLAX ) 17 g packet [id number] Yes Take 17 g by mouth daily as needed for mild constipation. [place], Camellia PARAS, DO  Active   prochlorperazine  (COMPAZINE ) 10 MG tablet [id number] Yes Take 1 tablet (10 mg total) by mouth every 6 (six) hours as needed for nausea or vomiting. Autumn Chinita, MD  Active Self           Med Note [name], [name]   Mon Aug 27, 2024 12:38 PM) Has not started yet  QUEtiapine  (SEROQUEL ) 100 MG  tablet [id number] Yes Take 100 mg by mouth at bedtime. [provider]  Active Self  rosuvastatin  (CRESTOR ) 20 MG tablet [id number] Yes Take 20 mg by mouth at bedtime. [provider]  Active Self  senna-docusate (SENOKOT-S) 8.6-50 MG tablet [id number] Yes Take 1 tablet by mouth 2 (two) times daily. [place], Camellia PARAS, DO  Active   vitamin B-12 (CYANOCOBALAMIN ) 1000 MCG tablet [id number] Yes Take 1,000 mcg by mouth daily. [provider]  Active Self  zaleplon (SONATA) 5 MG capsule [id number] Yes Take 5 mg by mouth at bedtime. [provider]  Active Self            Goals Addressed             This Visit's Progress    COMPLETED: [facility] Transitions of Care (TOC) Care Plan       Problems:  Recent Hospitalization for treatment of Acute Pancreatitis Knowledge Deficit Related to Acute Pancreatitis and Pancreatic Cancer  Goal:  Over the next 30 days, the patient will not experience hospital readmission  Interventions:  Transitions of Care: 10/03/24 Spoke with patient she reports she is doing okay.  She is taking OxyContin  q 12 hours.  She states her next appointments scheduled.  Patient completing TOC.  Referring to CCM program.        Doctor Visits  - discussed the importance of doctor visits Advised to:  Eat a low-fat diet. Do not drink alcohol.  Be safe with  medicines.  Get extra rest until you feel better.  Patient Self Care Activities:  Attend all scheduled provider appointments Call pharmacy for medication refills 3-7 days in advance of running out of medications Call provider office for new concerns or questions  Notify RN Care Manager of TOC call rescheduling needs Participate in Transition of Care Program/Attend TOC scheduled calls Take medications as prescribed   Avoid eating large/ heavy meals when feeling nauseated.  consider eating saltine crackers or dry toast, drink ginger ale. Advised to eat small frequent meals and avoid greasy, spicy or strong smelling foods. stay hydrated if possible- sips of water, ice chips, sugar free popsicles Please monitor for:   Abdominal pain  Weight loss  Yellowing of skin or eyes (jaundice)  Loss of appetite If you experience any of these please contact your doctor.      Plan:  The patient has been provided with contact information for the care management team and has been advised to call with any health related questions or concerns.         Recommendation:   Continue Current Plan of Care  Follow Up Plan:   Referral to RN Case Manager Closing From:  Transitions of Care Program Goal met.    Harlym Gehling J. Terecia Plaut RN, MSN [facility], [facility] Health RN Care Manager Direct Dial: [phone]  Fax: [phone] Website: [email or web address]

## 2024-10-03 NOTE — Patient Instructions (Signed)
 Visit Information  Thank you for taking time to visit with me today. Please don't hesitate to contact me if I can be of assistance to you.     Following is a copy of your care plan:   Goals Addressed             This Visit's Progress    COMPLETED: VBCI Transitions of Care (TOC) Care Plan       Problems:  Recent Hospitalization for treatment of Acute Pancreatitis Knowledge Deficit Related to Acute Pancreatitis and Pancreatic Cancer  Goal:  Over the next 30 days, the patient will not experience hospital readmission  Interventions:  Transitions of Care: 10/03/24 Spoke with patient she reports she is doing okay.  She is taking OxyContin  q 12 hours.  She states her next appointments scheduled.  Patient completing TOC.  Referring to CCM program.        Doctor Visits  - discussed the importance of doctor visits Advised to:  Eat a low-fat diet. Do not drink alcohol.  Be safe with medicines.  Get extra rest until you feel better.  Patient Self Care Activities:  Attend all scheduled provider appointments Call pharmacy for medication refills 3-7 days in advance of running out of medications Call provider office for new concerns or questions  Notify RN Care Manager of TOC call rescheduling needs Participate in Transition of Care Program/Attend TOC scheduled calls Take medications as prescribed   Avoid eating large/ heavy meals when feeling nauseated.  consider eating saltine crackers or dry toast, drink ginger ale. Advised to eat small frequent meals and avoid greasy, spicy or strong smelling foods. stay hydrated if possible- sips of water, ice chips, sugar free popsicles Please monitor for:   Abdominal pain  Weight loss  Yellowing of skin or eyes (jaundice)  Loss of appetite If you experience any of these please contact your doctor.      Plan:  The patient has been provided with contact information for the care management team and has been advised to call with any health  related questions or concerns.         Patient verbalizes understanding of instructions and care plan provided today and agrees to view in MyChart. Active MyChart status and patient understanding of how to access instructions and care plan via MyChart confirmed with patient.     The patient has been provided with contact information for the care management team and has been advised to call with any health related questions or concerns.   Please call the care guide team at [phone] if you need to cancel or reschedule your appointment.   Please call the Suicide and Crisis Lifeline: 988 if you are experiencing a Mental Health or Behavioral Health Crisis or need someone to talk to.  Skylen Danielsen J. Tamas Suen RN, MSN [facility], [facility] Health RN Care Manager Direct Dial: [phone]  Fax: [phone] Website: [email or web address]

## 2024-10-03 NOTE — Progress Notes (Signed)
 SPIRITUAL CARE AND COUNSELING CONSULT NOTE   VISIT SUMMARY Followed up with [name] by phone. She reports that she is continuing to pray and to feel spiritually encouraged by her pastoral team, as well as the Christian programming she watches on TV when she can't make it to church in person. She also notes that she has been more physically active recently, which is helping her morale and physical wellness. We plan to follow up by phone in the new year.  SPIRITUAL ENCOUNTER                                                                                                                                                                      Type of Visit: Follow up Care provided to:: Patient Referral source: Chaplain assessment  SPIRITUAL FRAMEWORK  Presenting Themes: Values and beliefs, Coping tools, Courage hope and growth, Community and relationships Community/Connection: Family, Spiritual leader   GOALS   Clinical Care Goals: Continue to provide availability for spiritual and emotional support   INTERVENTIONS   Spiritual Care Interventions Made: Compassionate presence, Reflective listening, Normalization of emotions, Explored values/beliefs/practices/strengths   INTERVENTION OUTCOMES   Outcomes: Autonomy/agency, Awareness of support, Connection to values and goals of care  SPIRITUAL CARE PLAN   Spiritual Care Issues Still Outstanding: Chaplain will continue to follow Follow up plan : Plan to follow up by phone after holidays    Chaplain Olam Filiberto Lemming, [facility] Pager [phone] Voicemail [phone]

## 2024-10-05 ENCOUNTER — Other Ambulatory Visit: Payer: Self-pay

## 2024-10-12 ENCOUNTER — Other Ambulatory Visit: Payer: Self-pay

## 2024-10-12 ENCOUNTER — Inpatient Hospital Stay: Admitting: Oncology

## 2024-10-12 ENCOUNTER — Encounter: Payer: Self-pay | Admitting: Oncology

## 2024-10-12 ENCOUNTER — Inpatient Hospital Stay

## 2024-10-12 VITALS — BP 110/59 | HR 85 | Temp 97.6°F | Resp 17 | Ht 64.0 in | Wt 203.0 lb

## 2024-10-12 DIAGNOSIS — C7A8 Other malignant neuroendocrine tumors: Secondary | ICD-10-CM

## 2024-10-12 DIAGNOSIS — G893 Neoplasm related pain (acute) (chronic): Secondary | ICD-10-CM | POA: Diagnosis not present

## 2024-10-12 DIAGNOSIS — Z5111 Encounter for antineoplastic chemotherapy: Secondary | ICD-10-CM | POA: Diagnosis not present

## 2024-10-12 LAB — CBC WITH DIFFERENTIAL (CANCER CENTER ONLY)
Abs Immature Granulocytes: 0.03 K/uL (ref 0.00–0.07)
Basophils Absolute: 0 K/uL (ref 0.0–0.1)
Basophils Relative: 1 %
Eosinophils Absolute: 0.1 K/uL (ref 0.0–0.5)
Eosinophils Relative: 1 %
HCT: 36 % (ref 36.0–46.0)
Hemoglobin: 11.5 g/dL — ABNORMAL LOW (ref 12.0–15.0)
Immature Granulocytes: 1 %
Lymphocytes Relative: 35 %
Lymphs Abs: 1.9 K/uL (ref 0.7–4.0)
MCH: 27.1 pg (ref 26.0–34.0)
MCHC: 31.9 g/dL (ref 30.0–36.0)
MCV: 84.7 fL (ref 80.0–100.0)
Monocytes Absolute: 0.8 K/uL (ref 0.1–1.0)
Monocytes Relative: 14 %
Neutro Abs: 2.7 K/uL (ref 1.7–7.7)
Neutrophils Relative %: 48 %
Platelet Count: 395 K/uL (ref 150–400)
RBC: 4.25 MIL/uL (ref 3.87–5.11)
RDW: 14.4 % (ref 11.5–15.5)
WBC Count: 5.4 K/uL (ref 4.0–10.5)
nRBC: 0 % (ref 0.0–0.2)

## 2024-10-12 LAB — CMP (CANCER CENTER ONLY)
ALT: 29 U/L (ref 0–44)
AST: 46 U/L — ABNORMAL HIGH (ref 15–41)
Albumin: 3.9 g/dL (ref 3.5–5.0)
Alkaline Phosphatase: 510 U/L — ABNORMAL HIGH (ref 38–126)
Anion gap: 11 (ref 5–15)
BUN: 5 mg/dL — ABNORMAL LOW (ref 6–20)
CO2: 27 mmol/L (ref 22–32)
Calcium: 9.7 mg/dL (ref 8.9–10.3)
Chloride: 103 mmol/L (ref 98–111)
Creatinine: 0.68 mg/dL (ref 0.44–1.00)
GFR, Estimated: >60 mL/min
Glucose, Bld: 158 mg/dL — ABNORMAL HIGH (ref 70–99)
Potassium: 3.7 mmol/L (ref 3.5–5.1)
Sodium: 141 mmol/L (ref 135–145)
Total Bilirubin: 0.3 mg/dL (ref 0.0–1.2)
Total Protein: 6.7 g/dL (ref 6.5–8.1)

## 2024-10-12 LAB — MAGNESIUM: Magnesium: 1.7 mg/dL (ref 1.7–2.4)

## 2024-10-12 MED ORDER — OXYCODONE HCL 10 MG PO TABS: 10.0000 mg | ORAL_TABLET | Freq: Four times a day (QID) | ORAL | 0 refills | Status: DC | PRN

## 2024-10-12 NOTE — Assessment & Plan Note (Addendum)
 Please review oncology history for additional details and timeline of events.  Biopsies from pancreatic tail lesion showed malignant cells.  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 Immunohistochemical stains were performed to characterize the tumor cells. The cells are positive for CK7, PAX8, TTF-1, synaptophysin and chromogranin A. The cells are negative for CK20, thyroglobulin, CDX2, GATA3, CK5/6, p40, D2-40, calretinin, WT1, HepPar 1, and Glypican 3. P53 is mutant type of staining. Ki67 proliferation index is approximately 50%.  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 The differential diagnosis includes metastatic high-grade neuroendocrine carcinoma from elsewhere in the body including pancreas (favored), gastrointestinal tract, lung, gynecological system, pancreatic acinar cell carcinoma or other types of carcinomas with neuroendocrine differentiation (solid pseudopapillary tumor with malignant transformation).   Previously I discussed the diagnosis, prognosis, plan of care, treatment options with the patient and her son who was accompanying.  Reviewed NCCN guidelines and discussed management options.  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  Recommended palliative systemic chemotherapy with cisplatin  and etoposide . She has previously expressed reservations about chemotherapy, preferring to rely on spiritual beliefs for healing. Without chemotherapy, the disease is expected to progress and worsen, potentially affecting other organs. Surgery is not feasible due to multiple liver metastases. She discussed this further with rest of the family members and her pastor before making final decision.  On her consultation with us  on 07/26/2024, we submitted request for staging PET  scan.  On 08/08/2024, PET scan showed multiple hepatic lesions with intense metabolic activity, progressed from July 2025, consistent with metastatic disease.  Hypermetabolic mass in the pancreatic body measuring approximately 4.3 x 2.8 cm, presumed primary malignancy.  No hypermetabolic abdominal lymph nodes.  No lung, skeletal mets.  Discussed treatment options after reviewing NCCN guidelines.  Plan made to proceed with cisplatin  and etoposide .  Cisplatin  will be given on day 1, etoposide  on days 1-3 of 21-day cycle.  Plan to continue for 6-8 cycles with restaging imaging after every 3 cycles.  We were planning to begin treatment outpatient.  However patient ended up getting hospitalized for intractable pain.  We started cycle 1 of cisplatin  and etoposide  when she was in the hospital, starting from 08/23/2024.  Tolerated cycle 1 well.  Labs today reveal no dose-limiting toxicities.  Will proceed with cycle 3 of chemotherapy from 10/15/24 at standard dosing.   Next chemotherapy cycle will be due in approximately 3 weeks.  I will plan to see her around 11/07/24.   - Plan follow-up imaging after the next cycle to assess disease status.

## 2024-10-12 NOTE — Progress Notes (Signed)
 "  [facility]  ONCOLOGY CLINIC PROGRESS NOTE   Patient Care Team: [name], [name] as PCP - General (Physician Assistant) Burnette Fallow, MD as Consulting Physician (Gastroenterology) Autumn Millman, MD as Consulting Physician (Oncology)  PATIENT NAME: Jo Allen   MR#: [id number] DOB: 07/03/1966  Date of visit: 10/12/2024   ASSESSMENT & PLAN:   Mattalyn Anderegg is a 58 y.o. 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   Primary malignant neuroendocrine tumor of pancreas [facility]) Please review oncology history for additional details and timeline of events.  Biopsies from pancreatic tail lesion showed malignant cells.  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 Immunohistochemical stains were performed to characterize the tumor cells. The cells are positive for CK7, PAX8, TTF-1, synaptophysin and chromogranin A. The cells are negative for CK20, thyroglobulin, CDX2, GATA3, CK5/6, p40, D2-40, calretinin, WT1, HepPar 1, and Glypican 3. P53 is mutant type of staining. Ki67 proliferation index is approximately 50%.  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 The differential diagnosis includes metastatic high-grade neuroendocrine carcinoma from elsewhere in the body including pancreas (favored), gastrointestinal tract, lung, gynecological system, pancreatic acinar cell carcinoma or other types of carcinomas with neuroendocrine differentiation (solid pseudopapillary tumor with malignant transformation).   Previously I discussed the diagnosis, prognosis, plan of care, treatment options with the patient and her son who was  accompanying.  Reviewed NCCN guidelines and discussed management options.  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  Recommended palliative systemic chemotherapy with cisplatin  and etoposide . She has previously expressed reservations about chemotherapy, preferring to rely on spiritual beliefs for healing. Without chemotherapy, the disease is expected to progress and worsen, potentially affecting other organs. Surgery is not feasible due to multiple liver metastases. She discussed this further with rest of the family members and her pastor before making final decision.  On her consultation with us  on 07/26/2024, we submitted request for staging PET scan.  On 08/08/2024, PET scan showed multiple hepatic lesions with intense metabolic activity, progressed from July 2025, consistent with metastatic disease.  Hypermetabolic mass in the pancreatic body measuring approximately 4.3 x 2.8 cm, presumed primary malignancy.  No hypermetabolic abdominal lymph nodes.  No lung, skeletal mets.  Discussed treatment options after reviewing NCCN guidelines.  Plan made to proceed with cisplatin  and etoposide .  Cisplatin  will be given on day 1, etoposide  on days 1-3 of 21-day cycle.  Plan to continue for 6-8 cycles with restaging imaging after every 3 cycles.  We were planning to begin treatment outpatient.  However patient ended up getting hospitalized for intractable pain.  We started cycle 1 of cisplatin  and etoposide  when she was in the hospital, starting from 08/23/2024.  Tolerated cycle 1 well.  Labs today reveal no dose-limiting toxicities.  Will proceed with cycle 3 of chemotherapy from 10/15/24 at standard dosing.   Next chemotherapy cycle will be due in approximately 3 weeks.  I will plan to see her around 11/07/24.   - Plan follow-up imaging after the next cycle to assess disease status.  Cancer related  pain She experiences persistent pain, predominantly in the back, partially controlled with oxycodone  10 mg twice daily. She has breakthrough pain between doses and previously did not  tolerate morphine . Plan is to optimize analgesia with long-acting oxycodone . - Prescribed long-acting oxycodone  (slow release), 90-day supply, up to three times daily as needed for pain control. - Instructed to continue current pain regimen and adjust dosing as needed for adequate analgesia.  Pancreatic exocrine insufficiency Managed with Creon . Clinically doing better, but pancreatic lab results are pending. - Continue Creon  as prescribed.  Anemia in neoplastic disease Hemoglobin level is 11, which is acceptable. No immediate concerns regarding anemia. - Continue to monitor hemoglobin levels.  We provided her with a letter to excuse her from work given ongoing chemotherapy and treatment-related side effects.  I reviewed lab results and outside records for this visit and discussed relevant results with the patient. Diagnosis, plan of care and treatment options were also discussed in detail with the patient. Opportunity provided to ask questions and answers provided to her apparent satisfaction. Provided instructions to call our clinic with any problems, questions or concerns prior to return visit. I recommended to continue follow-up with PCP and sub-specialists. She verbalized understanding and agreed with the plan.   NCCN guidelines have been consulted in the planning of this patients care.  I spent a total of 42 minutes during this encounter with the patient including review of chart and various tests results, discussions about plan of care and coordination of care plan.   Chinita Patten, MD  10/12/2024 3:01 PM  [facility] - [facility] DE[facility] 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  INTERVAL HISTORY:    Discussed the use of AI scribe software for clinical note transcription with the patient, who gave verbal consent to proceed.  History of Present Illness  Morris Markham is a 58 year old female with malignant pancreatic neuroendocrine tumor who presents for oncology follow-up and management of neoplasm-related symptoms.  She reports that she has been attending her chemotherapy treatments as scheduled. Recent laboratory studies show that her blood counts are stable and her alkaline phosphatase level, which had previously been elevated, has improved but remains above normal. She describes her overall condition as good and reports resting over the Christmas holiday.  She reports persistent cancer-related pain, primarily in the back. She is taking oxycodone  10 mg twice daily, which provides partial relief, but she experiences breakthrough pain between doses. She previously did not tolerate morphine . She is interested in adjusting her pain regimen to address inadequate pain control between doses.  Over the past week, she has developed bilateral lower extremity edema involving both feet and legs, which worsens throughout the day and is most pronounced in the evening. She does not use compression socks, only leggings. A caregiver corroborates that the swelling is worse at night.  She remains adherent to pancreatic enzyme replacement therapy (Creon ), taking it as directed with meals and snacks. She notes early satiety with certain foods, such as cereal, which causes her to feel excessively full.     I have reviewed the past medical history, past surgical history, social history and family history with the patient and they are unchanged from  previous note.  HISTORY OF PRESENT ILLNESS:   ONCOLOGY HISTORY:   Patient was hospitalized when she presented to the ED on 04/19/2024 with  complaints of abdominal pain.   CT abdomen and pelvis on 04/19/2024 showed intra and Perry pancreatic edema involving the pancreatic tail with thrombosis of the splenic vein around the tail of the pancreas.  Imaging features were compatible with acute pancreatitis.  14 mm ill-defined hypoattenuating focus in the tail of the pancreas may reflect focal edema.  MRI abdomen was recommended for further evaluation.   On 04/19/2024, MRI of the abdomen showed edematous appearance of the pancreatic tail with extensive peripancreatic and retroperitoneal fat stranding, consistent with acute pancreatitis.  In the central pancreatic tail there was a focal, hypoenhancing lesion measuring 1.1 x 0.8 cm.  Pancreatic duct was focally effaced in this vicinity.  No upstream pancreatic ductal dilatation.  Numerous tiny diffusion restricting foci scattered throughout the liver, some of the largest of which are with faint associated hyperenhancement and early arterial phase.  Constellation of the findings were suspicious for small pancreatic adenocarcinoma underlying acute pancreatitis with liver metastasis.  Close follow-up was recommended.   Patient was evaluated by GI during this hospitalization.  Once her pain was adequately controlled, she was discharged on 04/27/2024 with outpatient GI follow-up.   Upper EUS on 07/04/2024, performed by Dr. Burnette.  It showed multiple 15 to 20 mm hypoechoic, round well-defined lesions in the left lobe of liver, FNA performed.  There was no significant pathology in the pancreatic head, genu of the pancreas and uncinate process of the pancreas.  Irregular hypoechoic mass was identified at the junction of pancreatic body/tail, measuring 31 mm x 22 mm.  Stage-T3, N0 by endosonographic criteria.  There was evidence of pancreatic ductal dilation.   Biopsies from  pancreatic tail lesion showed malignant cells.  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 Immunohistochemical stains were performed to characterize the tumor cells. The cells are positive for CK7, PAX8, TTF-1, synaptophysin and chromogranin A. The cells are negative for CK20, thyroglobulin, CDX2, GATA3, CK5/6, p40, D2-40, calretinin, WT1, HepPar 1, and Glypican 3. P53 is mutant type of staining. Ki67 proliferation index is approximately 50%.  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 The differential diagnosis includes metastatic high-grade neuroendocrine carcinoma from elsewhere in the body including pancreas (favored), gastrointestinal tract, lung, gynecological system, pancreatic acinar cell carcinoma or other types of carcinomas with neuroendocrine differentiation (solid pseudopapillary tumor with malignant transformation). Additional workups will be performed at [facility] including INSM-1 to confirm neuroendocrine lineage, BCL10 and Chymotrypsin to exclude acinar cell carcinoma, LEF-1 and beta-catenin to exclude solid pseudopapillary tumor with malignant transformation (less likely). The results will be reported in an addendum.    Addendum # 1: Based on the morphologic and immunophenotypic features, a metastatic papillary thyroid  carcinoma cannot be excluded. Correlation with imaging findings is recommended.    Addendum # 2: Additional immunohistochemical stains were performed in outside institution to further classify the tumor. Beta-catenin is negative for nuclear stain. LEF-1 and INSM1 show weak but relatively diffuse staining.  BCL10 and trypsin show weakly nuclear and cytoplasmic staining without typical granular staining. Chymotrypsin is negative. The immunoprofile is non-specific and non-conclusive. Final classification should be rendered in the excisional specimen.    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  Recommended palliative systemic chemotherapy with carboplatin and etoposide .  Patient has reservations against chemotherapy and is not considering chemotherapy at this time but she will discuss further with rest of the family members and her pastor before making  final decision.   On her consultation with us  on 07/26/2024, we submitted request for staging PET scan.  On 08/08/2024, PET scan showed multiple hepatic lesions with intense metabolic activity, progressed from July 2025, consistent with metastatic disease.  Hypermetabolic mass in the pancreatic body measuring approximately 4.3 x 2.8 cm, presumed primary malignancy.  No hypermetabolic abdominal lymph nodes.  No lung, skeletal mets.  Patient agreeable to proceeding with systemic treatments.  Discussed treatment options after reviewing NCCN guidelines.  Plan made to proceed with cisplatin  and etoposide .  Cisplatin  will be given on day 1, etoposide  on days 1-3 of 21-day cycle.  Plan to continue for 6-8 cycles with restaging imaging after every 3 cycles.  She was admitted to the hospital for intractable pain.  We started cycle 1 of chemotherapy on 08/23/2024.  REVIEW OF SYSTEMS:   Review of Systems - Oncology  All other pertinent systems were reviewed with the patient and are negative.  ALLERGIES: She is allergic to trazodone, latex, penicillins, sm baby powder cornstarch [powders], and tape.  MEDICATIONS:  Current Outpatient Medications  Medication Sig Dispense Refill   albuterol  (VENTOLIN  HFA) 108 (90 Base) MCG/ACT inhaler Inhale 1-2 puffs into the lungs every 6 (six) hours as needed for wheezing or shortness of breath. 1 each 0   ALEVE  220 MG tablet Take 220-440 mg by mouth 2 (two) times daily as needed (for pain or headaches).     amLODipine  (NORVASC ) 10 MG tablet Take 1 tablet (10 mg total)  by mouth daily. 90 tablet 1   Biotin  1000 MCG CHEW Chew 2,000 mcg by mouth daily.     buPROPion  (WELLBUTRIN  XL) 150 MG 24 hr tablet Take 150 mg by mouth daily.     Cholecalciferol (VITAMIN D3) 50 MCG (2000 UT) capsule Take 4,000 Units by mouth daily.     CRANBERRY PO Take 1 tablet by mouth daily.     dexamethasone  (DECADRON ) 4 MG tablet Take for 1 day starting the day after chemotherapy on day 4. Take with food. 30 tablet 1   ferrous sulfate  325 (65 FE) MG EC tablet Take 325 mg by mouth 3 (three) times a week.     gabapentin  (NEURONTIN ) 300 MG capsule Take 1 capsule (300 mg total) by mouth 3 (three) times daily. 90 capsule 3   lamoTRIgine  (LAMICTAL ) 200 MG tablet Take 200 mg by mouth 2 (two) times daily.     levocetirizine (XYZAL) 5 MG tablet Take 5 mg by mouth every evening.     lidocaine -prilocaine  (EMLA ) cream Apply 1 Application topically as needed. 30 g 0   lidocaine -prilocaine  (EMLA ) cream Apply small amount to port site 30 minutes before being accessed as directed. Place a barrier shield over site after applying to maintain medication contact with skin. 30 g 2   lipase/protease/amylase (CREON ) 36000 UNITS CPEP capsule Take 1 capsule (36,000 Units total) by mouth 3 (three) times daily with meals. 200 capsule 0   Magnesium  Gluconate (MAGNESIUM  27 PO) Take 1 tablet by mouth at bedtime.     meloxicam (MOBIC) 15 MG tablet Take 15 mg by mouth daily.     metoprolol  succinate (TOPROL  XL) 50 MG 24 hr tablet Take 1 tablet (50 mg total) by mouth daily. Take with or immediately following a meal. 90 tablet 0   nitroGLYCERIN  (NITROSTAT ) 0.4 MG SL tablet Place 1 tablet (0.4 mg total) under the tongue every 5 (five) minutes as needed for chest pain. If you require more than two tablets five minutes apart  go to the nearest ER via EMS. 30 tablet 0   ondansetron  (ZOFRAN ) 8 MG tablet Take 1 tablet (8 mg total) by mouth every 8 (eight) hours as needed for nausea or vomiting. Start on the third day after  cisplatin . 30 tablet 1   Oxycodone  HCl 10 MG TABS Take 1 tablet (10 mg total) by mouth every 6 (six) hours as needed. 90 tablet 0   pantoprazole  (PROTONIX ) 40 MG tablet Take 1 tablet (40 mg total) by mouth daily. 90 tablet 0   polyethylene glycol (MIRALAX  / GLYCOLAX ) 17 g packet Take 17 g by mouth daily as needed for mild constipation. 30 each 2   prochlorperazine  (COMPAZINE ) 10 MG tablet Take 1 tablet (10 mg total) by mouth every 6 (six) hours as needed for nausea or vomiting. 30 tablet 1   QUEtiapine  (SEROQUEL ) 100 MG tablet Take 100 mg by mouth at bedtime.     rosuvastatin  (CRESTOR ) 20 MG tablet Take 20 mg by mouth at bedtime.     senna-docusate (SENOKOT-S) 8.6-50 MG tablet Take 1 tablet by mouth 2 (two) times daily. 60 tablet 2   vitamin B-12 (CYANOCOBALAMIN ) 1000 MCG tablet Take 1,000 mcg by mouth daily.     zaleplon (SONATA) 5 MG capsule Take 5 mg by mouth at bedtime.     No current facility-administered medications for this visit.     VITALS:   Blood pressure (!) 110/59, pulse 85, temperature 97.6 F (36.4 C), temperature source Temporal, resp. rate 17, height 5' 4 (1.626 m), weight 203 lb (92.1 kg), SpO2 99%.  Wt Readings from Last 3 Encounters:  10/12/24 203 lb (92.1 kg)  09/19/24 208 lb 8 oz (94.6 kg)  09/05/24 211 lb (95.7 kg)    Body mass index is 34.84 kg/m.    Onc Performance Status - 10/12/24 1400       ECOG Perf Status   ECOG Perf Status Ambulatory and capable of all selfcare but unable to carry out any work activities.  Up and about more than 50% of waking hours      KPS SCALE   KPS % SCORE Normal activity with effort, some s/s of disease            PHYSICAL EXAM:   Physical Exam Constitutional:      General: She is not in acute distress.    Appearance: Normal appearance.  HENT:     Head: Normocephalic and atraumatic.  Cardiovascular:     Rate and Rhythm: Normal rate.  Pulmonary:     Effort: Pulmonary effort is normal. No respiratory distress.   Abdominal:     General: There is no distension.  Neurological:     General: No focal deficit present.     Mental Status: She is alert and oriented to person, place, and time.  Psychiatric:        Mood and Affect: Mood normal.        Behavior: Behavior normal.       LABORATORY DATA:   I have reviewed the data as listed.  Results for orders placed or performed in visit on 10/12/24  CBC with Differential (Cancer Center Only)  Result Value Ref Range   WBC Count 5.4 4.0 - 10.5 K/uL   RBC 4.25 3.87 - 5.11 MIL/uL   Hemoglobin 11.5 (L) 12.0 - 15.0 g/dL   HCT 63.9 63.9 - 53.9 %   MCV 84.7 80.0 - 100.0 fL   MCH 27.1 26.0 - 34.0 pg   MCHC 31.9  30.0 - 36.0 g/dL   RDW 85.5 88.4 - 84.4 %   Platelet Count 395 150 - 400 K/uL   nRBC 0.0 0.0 - 0.2 %   Neutrophils Relative % 48 %   Neutro Abs 2.7 1.7 - 7.7 K/uL   Lymphocytes Relative 35 %   Lymphs Abs 1.9 0.7 - 4.0 K/uL   Monocytes Relative 14 %   Monocytes Absolute 0.8 0.1 - 1.0 K/uL   Eosinophils Relative 1 %   Eosinophils Absolute 0.1 0.0 - 0.5 K/uL   Basophils Relative 1 %   Basophils Absolute 0.0 0.0 - 0.1 K/uL   Immature Granulocytes 1 %   Abs Immature Granulocytes 0.03 0.00 - 0.07 K/uL  CMP (Cancer Center only)  Result Value Ref Range   Sodium 141 135 - 145 mmol/L   Potassium 3.7 3.5 - 5.1 mmol/L   Chloride 103 98 - 111 mmol/L   CO2 27 22 - 32 mmol/L   Glucose, Bld 158 (H) 70 - 99 mg/dL   BUN 5 (L) 6 - 20 mg/dL   Creatinine 9.31 9.55 - 1.00 mg/dL   Calcium  9.7 8.9 - 10.3 mg/dL   Total Protein 6.7 6.5 - 8.1 g/dL   Albumin 3.9 3.5 - 5.0 g/dL   AST 46 (H) 15 - 41 U/L   ALT 29 0 - 44 U/L   Alkaline Phosphatase 510 (H) 38 - 126 U/L   Total Bilirubin 0.3 0.0 - 1.2 mg/dL   GFR, Estimated >39 >39 mL/min   Anion gap 11 5 - 15  Magnesium   Result Value Ref Range   Magnesium  1.7 1.7 - 2.4 mg/dL      RADIOGRAPHIC STUDIES:  I have personally reviewed the radiological images as listed and agree with the findings in the  report.  MM 3D SCREENING MAMMOGRAM BILATERAL BREAST Result Date: 09/21/2024 CLINICAL DATA:  Screening. EXAM: DIGITAL SCREENING BILATERAL MAMMOGRAM WITH TOMOSYNTHESIS AND CAD TECHNIQUE: Bilateral screening digital craniocaudal and mediolateral oblique mammograms were obtained. Bilateral screening digital breast tomosynthesis was performed. The images were evaluated with computer-aided detection. COMPARISON:  Previous exam(s). ACR Breast Density Category b: There are scattered areas of fibroglandular density. FINDINGS: There are no findings suspicious for malignancy. IMPRESSION: No mammographic evidence of malignancy. A result letter of this screening mammogram will be mailed directly to the patient. RECOMMENDATION: Screening mammogram in one year. (Code:SM-B-01Y) BI-RADS CATEGORY  1: Negative. Electronically Signed   By: Alm Parkins M.D.   On: 09/21/2024 11:52     CODE STATUS:  Code Status History     Date Active Date Inactive Code Status Order ID Comments User Context   04/19/2024 1025 04/27/2024 1655 Full Code [id number]  Zella Katha HERO, MD ED   04/29/2023 0847 04/30/2023 1450 Full Code [id number]  Ladona Heinz, MD Inpatient    Questions for Most Recent Historical Code Status (Order [id number])     Question Answer   By: Consent: discussion documented in EHR            Orders Placed This Encounter  Procedures   CBC with Differential (Cancer Center Only)    Standing Status:   Future    Expected Date:   11/28/2024    Expiration Date:   11/28/2025   CMP (Cancer Center only)    Standing Status:   Future    Expected Date:   11/28/2024    Expiration Date:   11/28/2025   Magnesium     Standing Status:   Future    Expected Date:  11/28/2024    Expiration Date:   11/28/2025   CBC with Differential (Cancer Center Only)    Standing Status:   Future    Expected Date:   12/19/2024    Expiration Date:   12/19/2025   CMP (Cancer Center only)    Standing Status:   Future    Expected Date:   12/19/2024     Expiration Date:   12/19/2025   Magnesium     Standing Status:   Future    Expected Date:   12/19/2024    Expiration Date:   12/19/2025     Future Appointments  Date Time Provider Department Center  10/15/2024  8:00 AM [facility] None  10/16/2024  2:00 PM [facility] None  10/17/2024  1:00 PM [facility] None  10/23/2024 11:00 AM Alvia Olam BIRCH, RN [facility] None     This document was completed utilizing speech recognition software. Grammatical errors, random word insertions, pronoun errors, and incomplete sentences are an occasional consequence of this system due to software limitations, ambient noise, and hardware issues. Any formal questions or concerns about the content, text or information contained within the body of this dictation should be directly addressed to the provider for clarification.   "

## 2024-10-15 ENCOUNTER — Inpatient Hospital Stay

## 2024-10-15 VITALS — BP 119/63 | HR 83 | Temp 98.4°F | Resp 16

## 2024-10-15 DIAGNOSIS — Z5111 Encounter for antineoplastic chemotherapy: Secondary | ICD-10-CM | POA: Diagnosis not present

## 2024-10-15 DIAGNOSIS — C7A8 Other malignant neuroendocrine tumors: Secondary | ICD-10-CM

## 2024-10-15 MED ORDER — SODIUM CHLORIDE 0.9 % IV SOLN
100.0000 mg/m2 | Freq: Once | INTRAVENOUS | Status: AC
  Administered 2024-10-15: 200 mg via INTRAVENOUS
  Filled 2024-10-15: qty 10

## 2024-10-15 MED ORDER — CISPLATIN CHEMO INJECTION 100MG/100ML
75.0000 mg/m2 | Freq: Once | INTRAVENOUS | Status: AC
  Administered 2024-10-15: 150 mg via INTRAVENOUS
  Filled 2024-10-15: qty 150

## 2024-10-15 MED ORDER — DEXAMETHASONE SOD PHOSPHATE PF 10 MG/ML IJ SOLN
10.0000 mg | Freq: Once | INTRAMUSCULAR | Status: AC
  Administered 2024-10-15: 10 mg via INTRAVENOUS

## 2024-10-15 MED ORDER — POTASSIUM CHLORIDE IN NACL 20-0.9 MEQ/L-% IV SOLN
Freq: Once | INTRAVENOUS | Status: AC
  Filled 2024-10-15: qty 1000

## 2024-10-15 MED ORDER — APREPITANT 130 MG/18ML IV EMUL
130.0000 mg | Freq: Once | INTRAVENOUS | Status: AC
  Administered 2024-10-15: 130 mg via INTRAVENOUS
  Filled 2024-10-15: qty 18

## 2024-10-15 MED ORDER — SODIUM CHLORIDE 0.9 % IV SOLN: INTRAVENOUS | Status: DC

## 2024-10-15 MED ORDER — ACETAMINOPHEN 500 MG PO TABS
500.0000 mg | ORAL_TABLET | Freq: Once | ORAL | Status: AC
  Administered 2024-10-15: 500 mg via ORAL
  Filled 2024-10-15: qty 1

## 2024-10-15 MED ORDER — MAGNESIUM SULFATE 2 GM/50ML IV SOLN
2.0000 g | Freq: Once | INTRAVENOUS | Status: AC
  Administered 2024-10-15: 2 g via INTRAVENOUS
  Filled 2024-10-15: qty 50

## 2024-10-15 MED ORDER — PALONOSETRON HCL INJECTION 0.25 MG/5ML
0.2500 mg | Freq: Once | INTRAVENOUS | Status: AC
  Administered 2024-10-15: 0.25 mg via INTRAVENOUS
  Filled 2024-10-15: qty 5

## 2024-10-15 NOTE — Patient Instructions (Signed)
 [facility] - A DEPT OF [facility]  Discharge Instructions: Thank you for choosing [facility] to provide your oncology and hematology care.   If you have a lab appointment with the Cancer Center, please go directly to the Cancer Center and check in at the registration area.   Wear comfortable clothing and clothing appropriate for easy access to any Portacath or PICC line.   We strive to give you quality time with your provider. You may need to reschedule your appointment if you arrive late (15 or more minutes).  Arriving late affects you and other patients whose appointments are after yours.  Also, if you miss three or more appointments without notifying the office, you may be dismissed from the clinic at the provider's discretion.      For prescription refill requests, have your pharmacy contact our office and allow 72 hours for refills to be completed.    Today you received the following chemotherapy and/or immunotherapy agents :  Cisplatin & Etoposide      To help prevent nausea and vomiting after your treatment, we encourage you to take your nausea medication as directed.  BELOW ARE SYMPTOMS THAT SHOULD BE REPORTED IMMEDIATELY: *FEVER GREATER THAN 100.4 F (38 C) OR HIGHER *CHILLS OR SWEATING *NAUSEA AND VOMITING THAT IS NOT CONTROLLED WITH YOUR NAUSEA MEDICATION *UNUSUAL SHORTNESS OF BREATH *UNUSUAL BRUISING OR BLEEDING *URINARY PROBLEMS (pain or burning when urinating, or frequent urination) *BOWEL PROBLEMS (unusual diarrhea, constipation, pain near the anus) TENDERNESS IN MOUTH AND THROAT WITH OR WITHOUT PRESENCE OF ULCERS (sore throat, sores in mouth, or a toothache) UNUSUAL RASH, SWELLING OR PAIN  UNUSUAL VAGINAL DISCHARGE OR ITCHING   Items with * indicate a potential emergency and should be followed up as soon as possible or go to the Emergency Department if any problems should occur.  Please show the CHEMOTHERAPY ALERT CARD or  IMMUNOTHERAPY ALERT CARD at check-in to the Emergency Department and triage nurse.  Should you have questions after your visit or need to cancel or reschedule your appointment, please contact [facility] - A DEPT OF [facility]  Dept: [phone]  and follow the prompts.  Office hours are 8:00 a.m. to 4:30 p.m. Monday - Friday. Please note that voicemails left after 4:00 p.m. may not be returned until the following business day.  We are closed weekends and major holidays. You have access to a nurse at all times for urgent questions. Please call the main number to the clinic Dept: [phone] and follow the prompts.   For any non-urgent questions, you may also contact your provider using MyChart. We now offer e-Visits for anyone 49 and older to request care online for non-urgent symptoms. For details visit [email or web address].   Also download the MyChart app! Go to the app store, search "MyChart", open the app, select [place], and log in with your MyChart username and password.

## 2024-10-15 NOTE — Progress Notes (Signed)
 Per Lacie NP OK to proceed with tx today with urine output 100 cc's, Pt to void again prior to d/c home.

## 2024-10-16 ENCOUNTER — Inpatient Hospital Stay

## 2024-10-16 VITALS — BP 111/57 | HR 81 | Temp 97.8°F | Resp 14

## 2024-10-16 DIAGNOSIS — Z5111 Encounter for antineoplastic chemotherapy: Secondary | ICD-10-CM | POA: Diagnosis not present

## 2024-10-16 DIAGNOSIS — C7A8 Other malignant neuroendocrine tumors: Secondary | ICD-10-CM

## 2024-10-16 MED ORDER — DEXAMETHASONE SOD PHOSPHATE PF 10 MG/ML IJ SOLN
10.0000 mg | Freq: Once | INTRAMUSCULAR | Status: AC
  Administered 2024-10-16: 10 mg via INTRAVENOUS

## 2024-10-16 MED ORDER — SODIUM CHLORIDE 0.9 % IV SOLN
100.0000 mg/m2 | Freq: Once | INTRAVENOUS | Status: AC
  Administered 2024-10-16: 200 mg via INTRAVENOUS
  Filled 2024-10-16: qty 10

## 2024-10-16 MED ORDER — SODIUM CHLORIDE 0.9 % IV SOLN: INTRAVENOUS | Status: DC

## 2024-10-17 ENCOUNTER — Encounter: Payer: Self-pay | Admitting: General Practice

## 2024-10-17 ENCOUNTER — Inpatient Hospital Stay

## 2024-10-17 ENCOUNTER — Telehealth: Payer: Self-pay | Admitting: *Deleted

## 2024-10-17 VITALS — BP 136/60 | HR 90 | Temp 98.6°F | Resp 18

## 2024-10-17 DIAGNOSIS — C7A8 Other malignant neuroendocrine tumors: Secondary | ICD-10-CM

## 2024-10-17 DIAGNOSIS — Z5111 Encounter for antineoplastic chemotherapy: Secondary | ICD-10-CM | POA: Diagnosis not present

## 2024-10-17 MED ORDER — HYDROMORPHONE HCL 1 MG/ML IJ SOLN
0.5000 mg | Freq: Once | INTRAMUSCULAR | Status: AC
  Administered 2024-10-17: 0.5 mg via INTRAVENOUS
  Filled 2024-10-17: qty 1

## 2024-10-17 MED ORDER — DEXAMETHASONE SOD PHOSPHATE PF 10 MG/ML IJ SOLN
10.0000 mg | Freq: Once | INTRAMUSCULAR | Status: AC
  Administered 2024-10-17: 10 mg via INTRAVENOUS

## 2024-10-17 MED ORDER — SODIUM CHLORIDE 0.9 % IV SOLN
100.0000 mg/m2 | Freq: Once | INTRAVENOUS | Status: AC
  Administered 2024-10-17: 200 mg via INTRAVENOUS
  Filled 2024-10-17: qty 10

## 2024-10-17 MED ORDER — SODIUM CHLORIDE 0.9 % IV SOLN: INTRAVENOUS | Status: DC

## 2024-10-17 NOTE — Progress Notes (Signed)
 Patient c/o 6/10 pain in abdomen 1.5 hours after receiving IV Dilaudid .  Dr. Tina notified; MD gave VO for second dose of IV Dilaudid .  Per MD, OK to discharge patient home after second dose Dilaudid  to manage pain with new pain medication prescription.  Patient and patient's family member informed, agreeable to plan to d/c home with pain medication.

## 2024-10-17 NOTE — Progress Notes (Signed)
 Per Dr. Tina - okay to proceed with treatment with progressing abdominal pain (7/10). Patient states that pain has been ongoing and progressively getting worse over the past 3 days. Previous on-call provider had been made aware of the situation yesterday. Patient agreeable to continuing with treatment today. Encouraged pt to pick up higher dose pain medication that was prescribed on 12/26. Current pain medication regimen not effective. Patient agreeable to palliative care consult for ongoing pain management assistance.  Patient to receive IV dilaudid  while receiving treatment today. Verbal orders confirmed and placed.

## 2024-10-17 NOTE — Telephone Encounter (Signed)
 Patient called and states she has appointment today @1300 .  C/O pain and burning in belly button, pain with breathing and pain up back.  Secure chatted Erin/RN in infusion.  States she already taken care of the issues.

## 2024-10-17 NOTE — Progress Notes (Signed)
 SPIRITUAL CARE AND COUNSELING CONSULT NOTE   VISIT SUMMARY Followed up in infusion per Jo Allen' request. She is processing her diagnosis and treatment at a different pace than some of her family members, with a different theological perspective about her care trajectory, and feeling rushed to make end-of-life decisions and arrangements before she is ready. She trusts God to let her know when the time comes to address these matters. Provided empathic listening, emotional support, normalization of feelings, prayer, and suggestion that she could learn more about Advance Directives, for example, in a way that separates information-seeking from deciding. Spiritual Care can assist with education as patient desires; this process can be separate from formal Advance Directives Clinic as needed.   SPIRITUAL ENCOUNTER                                                                                                                                                                      Type of Visit: Follow up Care provided to:: Pt and family Referral source: Patient request Reason for visit: Routine spiritual support  SPIRITUAL FRAMEWORK  Presenting Themes: Goals in life/care, Values and beliefs Values/beliefs: Life proceeds in divine timing; God will let patient know when it is time to address end-of-life concerns. Community/Connection: Family, Faith community, Designer, multimedia Strengths: Autonomy: programmer, applications and preferences Needs/Challenges/Barriers: Navigating family's preferences about timing of addressing end-of-life concerns. Patient Stress Factors: Loss of control   GOALS   Clinical Care Goals: Continue to provide Spiritual Care availability, especially neutral space for processing and reflection.   INTERVENTIONS   Spiritual Care Interventions Made: Compassionate presence, Reflective listening, Normalization of emotions, Decision-making support/facilitation, Reconciliation  with self/others, Explored values/beliefs/practices/strengths   INTERVENTION OUTCOMES   Outcomes: Autonomy/agency, Connection to values and goals of care, Awareness of support, Reduced isolation  SPIRITUAL CARE PLAN   Spiritual Care Issues Still Outstanding: Chaplain will continue to follow Follow up plan : Plan to follow up at next infusion.    [address] [name], [place], [facility] Pager [phone] Voicemail [phone]

## 2024-10-17 NOTE — Patient Instructions (Signed)
 [facility] - A DEPT OF [facility]  Discharge Instructions: Thank you for choosing [facility] to provide your oncology and hematology care.   If you have a lab appointment with the Cancer Center, please go directly to the Cancer Center and check in at the registration area.   Wear comfortable clothing and clothing appropriate for easy access to any Portacath or PICC line.   We strive to give you quality time with your provider. You may need to reschedule your appointment if you arrive late (15 or more minutes).  Arriving late affects you and other patients whose appointments are after yours.  Also, if you miss three or more appointments without notifying the office, you may be dismissed from the clinic at the provider's discretion.      For prescription refill requests, have your pharmacy contact our office and allow 72 hours for refills to be completed.    Today you received the following chemotherapy and/or immunotherapy agents: Etoposide      To help prevent nausea and vomiting after your treatment, we encourage you to take your nausea medication as directed.  BELOW ARE SYMPTOMS THAT SHOULD BE REPORTED IMMEDIATELY: *FEVER GREATER THAN 100.4 F (38 C) OR HIGHER *CHILLS OR SWEATING *NAUSEA AND VOMITING THAT IS NOT CONTROLLED WITH YOUR NAUSEA MEDICATION *UNUSUAL SHORTNESS OF BREATH *UNUSUAL BRUISING OR BLEEDING *URINARY PROBLEMS (pain or burning when urinating, or frequent urination) *BOWEL PROBLEMS (unusual diarrhea, constipation, pain near the anus) TENDERNESS IN MOUTH AND THROAT WITH OR WITHOUT PRESENCE OF ULCERS (sore throat, sores in mouth, or a toothache) UNUSUAL RASH, SWELLING OR PAIN  UNUSUAL VAGINAL DISCHARGE OR ITCHING   Items with * indicate a potential emergency and should be followed up as soon as possible or go to the Emergency Department if any problems should occur.  Please show the CHEMOTHERAPY ALERT CARD or IMMUNOTHERAPY  ALERT CARD at check-in to the Emergency Department and triage nurse.  Should you have questions after your visit or need to cancel or reschedule your appointment, please contact [facility] - A DEPT OF [facility]  Dept: [phone]  and follow the prompts.  Office hours are 8:00 a.m. to 4:30 p.m. Monday - Friday. Please note that voicemails left after 4:00 p.m. may not be returned until the following business day.  We are closed weekends and major holidays. You have access to a nurse at all times for urgent questions. Please call the main number to the clinic Dept: [phone] and follow the prompts.   For any non-urgent questions, you may also contact your provider using MyChart. We now offer e-Visits for anyone 35 and older to request care online for non-urgent symptoms. For details visit [email or web address].   Also download the MyChart app! Go to the app store, search MyChart, open the app, select [place], and log in with your MyChart username and password.

## 2024-10-19 ENCOUNTER — Telehealth: Payer: Self-pay | Admitting: Oncology

## 2024-10-19 NOTE — Telephone Encounter (Signed)
 I spoke with patient and she is aware of all appointments scheduled for 11/06/2024 through 11/09/2024.

## 2024-10-21 ENCOUNTER — Encounter: Payer: Self-pay | Admitting: Oncology

## 2024-10-21 ENCOUNTER — Other Ambulatory Visit: Payer: Self-pay

## 2024-10-23 ENCOUNTER — Encounter: Payer: Self-pay | Admitting: *Deleted

## 2024-10-23 ENCOUNTER — Telehealth: Payer: Self-pay | Admitting: Nurse Practitioner

## 2024-10-23 ENCOUNTER — Telehealth: Payer: Self-pay | Admitting: *Deleted

## 2024-10-23 NOTE — Patient Instructions (Signed)
 Jo Allen - I am sorry I was unable to reach you today for our scheduled appointment. I work with [name], PA and am calling to support your healthcare needs. Please contact me at [phone] at your earliest convenience. I look forward to speaking with you soon.   Thank you,   Olam Ku, RN, BSN [place]  [facility], [facility] RN Care Manager Direct Dial: [phone]  Fax: [phone]

## 2024-10-25 ENCOUNTER — Telehealth: Payer: Self-pay | Admitting: *Deleted

## 2024-10-25 NOTE — Progress Notes (Unsigned)
 Complex Care Management Care Guide Note  10/25/2024 Name: Jo Allen MRN: [id number] DOB: 1966/06/03  Jo Allen is a 59 y.o. year old female who is a primary care patient of [name], [name] and is actively engaged with the care management team. I reached out to [employer] by phone today to assist with re-scheduling  with the RN Case Manager.  Follow up plan: Unsuccessful telephone outreach attempt made. A HIPAA compliant phone message was left for the patient providing contact information and requesting a return call.  Thedford Franks, CMA, AAMA [place]  [facility], [facility] Guide, Lead Direct Dial: [phone]  Fax: [phone]

## 2024-10-26 ENCOUNTER — Encounter: Payer: Self-pay | Admitting: Oncology

## 2024-10-26 ENCOUNTER — Emergency Department (HOSPITAL_COMMUNITY)

## 2024-10-26 ENCOUNTER — Emergency Department (HOSPITAL_COMMUNITY)
Admission: EM | Admit: 2024-10-26 | Discharge: 2024-10-26 | Disposition: A | Discharge status: Home / Self Care | Attending: Emergency Medicine | Admitting: Emergency Medicine

## 2024-10-26 ENCOUNTER — Other Ambulatory Visit: Payer: Self-pay

## 2024-10-26 DIAGNOSIS — J069 Acute upper respiratory infection, unspecified: Secondary | ICD-10-CM | POA: Diagnosis not present

## 2024-10-26 DIAGNOSIS — Z8507 Personal history of malignant neoplasm of pancreas: Secondary | ICD-10-CM | POA: Insufficient documentation

## 2024-10-26 DIAGNOSIS — Z91040 Latex allergy status: Secondary | ICD-10-CM | POA: Insufficient documentation

## 2024-10-26 DIAGNOSIS — R112 Nausea with vomiting, unspecified: Secondary | ICD-10-CM | POA: Insufficient documentation

## 2024-10-26 DIAGNOSIS — R0981 Nasal congestion: Secondary | ICD-10-CM | POA: Diagnosis present

## 2024-10-26 DIAGNOSIS — C7A8 Other malignant neuroendocrine tumors: Secondary | ICD-10-CM

## 2024-10-26 LAB — RESP PANEL BY RT-PCR (RSV, FLU A&B, COVID)  RVPGX2
Influenza A by PCR: NEGATIVE
Influenza B by PCR: NEGATIVE
Resp Syncytial Virus by PCR: NEGATIVE
SARS Coronavirus 2 by RT PCR: NEGATIVE

## 2024-10-26 LAB — URINALYSIS, ROUTINE W REFLEX MICROSCOPIC
Bilirubin Urine: NEGATIVE
Glucose, UA: NEGATIVE mg/dL
Hgb urine dipstick: NEGATIVE
Ketones, ur: NEGATIVE mg/dL
Leukocytes,Ua: NEGATIVE
Nitrite: NEGATIVE
Protein, ur: NEGATIVE mg/dL
Specific Gravity, Urine: 1.01 (ref 1.005–1.030)
pH: 8 (ref 5.0–8.0)

## 2024-10-26 LAB — COMPREHENSIVE METABOLIC PANEL WITH GFR
ALT: 27 U/L (ref 0–44)
AST: 37 U/L (ref 15–41)
Albumin: 3.8 g/dL (ref 3.5–5.0)
Alkaline Phosphatase: 597 U/L — ABNORMAL HIGH (ref 38–126)
Anion gap: 11 (ref 5–15)
BUN: <5 mg/dL — ABNORMAL LOW (ref 6–20)
CO2: 26 mmol/L (ref 22–32)
Calcium: 10 mg/dL (ref 8.9–10.3)
Chloride: 103 mmol/L (ref 98–111)
Creatinine, Ser: 0.54 mg/dL (ref 0.44–1.00)
GFR, Estimated: >60 mL/min
Glucose, Bld: 122 mg/dL — ABNORMAL HIGH (ref 70–99)
Potassium: 3.8 mmol/L (ref 3.5–5.1)
Sodium: 140 mmol/L (ref 135–145)
Total Bilirubin: 0.3 mg/dL (ref 0.0–1.2)
Total Protein: 7 g/dL (ref 6.5–8.1)

## 2024-10-26 LAB — CBC
HCT: 35.1 % — ABNORMAL LOW (ref 36.0–46.0)
Hemoglobin: 11 g/dL — ABNORMAL LOW (ref 12.0–15.0)
MCH: 27 pg (ref 26.0–34.0)
MCHC: 31.3 g/dL (ref 30.0–36.0)
MCV: 86.2 fL (ref 80.0–100.0)
Platelets: 161 K/uL (ref 150–400)
RBC: 4.07 MIL/uL (ref 3.87–5.11)
RDW: 13.7 % (ref 11.5–15.5)
WBC: 4.4 K/uL (ref 4.0–10.5)
nRBC: 0 % (ref 0.0–0.2)

## 2024-10-26 LAB — LIPASE, BLOOD: Lipase: 128 U/L — ABNORMAL HIGH (ref 11–51)

## 2024-10-26 MED ORDER — PROCHLORPERAZINE MALEATE 10 MG PO TABS: 10.0000 mg | ORAL_TABLET | Freq: Three times a day (TID) | ORAL | 1 refills | Status: AC | PRN

## 2024-10-26 MED ORDER — HYDROMORPHONE HCL 1 MG/ML IJ SOLN
1.0000 mg | Freq: Once | INTRAMUSCULAR | Status: AC
  Administered 2024-10-26: 1 mg via INTRAVENOUS
  Filled 2024-10-26: qty 1

## 2024-10-26 MED ORDER — LACTATED RINGERS IV BOLUS
1000.0000 mL | Freq: Once | INTRAVENOUS | Status: AC
  Administered 2024-10-26: 1000 mL via INTRAVENOUS

## 2024-10-26 MED ORDER — ONDANSETRON HCL 4 MG/2ML IJ SOLN
4.0000 mg | Freq: Once | INTRAMUSCULAR | Status: AC
  Administered 2024-10-26: 4 mg via INTRAVENOUS
  Filled 2024-10-26: qty 2

## 2024-10-26 MED ORDER — PROCHLORPERAZINE EDISYLATE 10 MG/2ML IJ SOLN
10.0000 mg | Freq: Once | INTRAMUSCULAR | Status: AC
  Administered 2024-10-26: 10 mg via INTRAVENOUS
  Filled 2024-10-26: qty 2

## 2024-10-26 MED ORDER — DIPHENHYDRAMINE HCL 50 MG/ML IJ SOLN
12.5000 mg | Freq: Once | INTRAMUSCULAR | Status: AC
  Administered 2024-10-26: 12.5 mg via INTRAVENOUS
  Filled 2024-10-26: qty 1

## 2024-10-26 NOTE — ED Triage Notes (Signed)
 [facility] pt c/o , running nose, n/v , unable to hold food down, chills with back pain

## 2024-10-26 NOTE — ED Notes (Signed)
 Patient requesting port to be access

## 2024-10-26 NOTE — Discharge Instructions (Addendum)
 You appear to have a viral illness that is causing your symptoms today. Symptoms typically resolve on their own within the next 4-5 days. Please eat a bland diet with foods such as rice, toast, crackers, and then advance your diet as tolerated.  You have been prescribed Compazine  which is a nausea medication.  You may take this up to every 8 hours as needed for nausea and vomiting.  You were given a dose here today.  Follow-up instructions: Please follow-up with your primary care provider for further evaluation of your symptoms if you are not feeling better within the next 5 days.   Return instructions:  Please return to the Emergency Department if you experience worsening symptoms.  RETURN IMMEDIATELY IF you develop shortness of breath, confusion or altered mental status, a new rash, become dizzy, faint, or poorly responsive, or are unable to be cared for at home. Please return if you have persistent vomiting and cannot keep down fluids or develop a fever  Please return if you have any other emergent concerns.

## 2024-10-26 NOTE — ED Provider Notes (Signed)
 " [place] EMERGENCY DEPARTMENT AT [facility] Provider Note   CSN: [id number] Arrival date & time: 10/26/24  1136     Patient presents with: URI   Jo Allen is a 59 y.o. female with pancreatic cancer currently undergoing treatment, hypertension, presents with concern for nasal congestion and runny nose, nausea and vomiting, and chills for the past week.  She also reports decreased oral intake due to the vomiting. She reports that she has had decreased bowel movements but was able to have a loose stool this morning.  Denies any cough or sore throat. Reports ongoing abdominal pain she attributes to her pancreatic cancer.  Denies any change in this pain.  Denies any fevers at home.    URI      Prior to Admission medications  Medication Sig Start Date End Date Taking? Authorizing Provider  albuterol  (VENTOLIN  HFA) 108 (90 Base) MCG/ACT inhaler Inhale 1-2 puffs into the lungs every 6 (six) hours as needed for wheezing or shortness of breath. 01/03/22   Billy Asberry FALCON, PA-C  ALEVE  220 MG tablet Take 220-440 mg by mouth 2 (two) times daily as needed (for pain or headaches).    [provider]  amLODipine  (NORVASC ) 10 MG tablet Take 1 tablet (10 mg total) by mouth daily. 04/29/23   Ladona Heinz, MD  Biotin  1000 MCG CHEW Chew 2,000 mcg by mouth daily.    [provider]  buPROPion  (WELLBUTRIN  XL) 150 MG 24 hr tablet Take 150 mg by mouth daily. 01/24/24   [provider]  Cholecalciferol (VITAMIN D3) 50 MCG (2000 UT) capsule Take 4,000 Units by mouth daily.    [provider]  CRANBERRY PO Take 1 tablet by mouth daily.    [provider]  dexamethasone  (DECADRON ) 4 MG tablet Take for 1 day starting the day after chemotherapy on day 4. Take with food. 08/15/24   Pasam, Chinita, MD  ferrous sulfate  325 (65 FE) MG EC tablet Take 325 mg by mouth 3 (three) times a week.    [provider]  gabapentin  (NEURONTIN ) 300 MG capsule Take 1  capsule (300 mg total) by mouth 3 (three) times daily. 09/26/24   Pasam, Chinita, MD  lamoTRIgine  (LAMICTAL ) 200 MG tablet Take 200 mg by mouth 2 (two) times daily.    [provider]  levocetirizine (XYZAL) 5 MG tablet Take 5 mg by mouth every evening.    [provider]  lidocaine -prilocaine  (EMLA ) cream Apply 1 Application topically as needed. 09/05/24   Pasam, Chinita, MD  lidocaine -prilocaine  (EMLA ) cream Apply small amount to port site 30 minutes before being accessed as directed. Place a barrier shield over site after applying to maintain medication contact with skin. 09/11/24   Pasam, Chinita, MD  lipase/protease/amylase (CREON ) 36000 UNITS CPEP capsule Take 1 capsule (36,000 Units total) by mouth 3 (three) times daily with meals. 08/31/24   Pasam, Chinita, MD  Magnesium  Gluconate (MAGNESIUM  27 PO) Take 1 tablet by mouth at bedtime.    [provider]  meloxicam (MOBIC) 15 MG tablet Take 15 mg by mouth daily.    [provider]  metoprolol  succinate (TOPROL  XL) 50 MG 24 hr tablet Take 1 tablet (50 mg total) by mouth daily. Take with or immediately following a meal. 08/26/24 11/24/24  [place], Camellia PARAS, DO  nitroGLYCERIN  (NITROSTAT ) 0.4 MG SL tablet Place 1 tablet (0.4 mg total) under the tongue every 5 (five) minutes as needed for chest pain. If you require more than two  tablets five minutes apart go to the nearest ER via EMS. 08/26/24 10/03/24  [place], Eric J, DO  ondansetron  (ZOFRAN ) 8 MG tablet Take 1 tablet (8 mg total) by mouth every 8 (eight) hours as needed for nausea or vomiting. Start on the third day after cisplatin . 08/26/24   [place], Camellia PARAS, DO  Oxycodone  HCl 10 MG TABS Take 1 tablet (10 mg total) by mouth every 6 (six) hours as needed. 10/12/24   Pasam, Chinita, MD  pantoprazole  (PROTONIX ) 40 MG tablet Take 1 tablet (40 mg total) by mouth daily. 08/27/24 11/25/24  [place], Camellia PARAS, DO  polyethylene glycol (MIRALAX  / GLYCOLAX ) 17 g packet Take 17 g by  mouth daily as needed for mild constipation. 08/26/24 11/24/24  [place], Camellia PARAS, DO  prochlorperazine  (COMPAZINE ) 10 MG tablet Take 1 tablet (10 mg total) by mouth every 8 (eight) hours as needed for nausea or vomiting. 10/26/24   Veta Palma, PA-C  QUEtiapine  (SEROQUEL ) 100 MG tablet Take 100 mg by mouth at bedtime. 04/04/24   [provider]  rosuvastatin  (CRESTOR ) 20 MG tablet Take 20 mg by mouth at bedtime. 08/02/24   [provider]  senna-docusate (SENOKOT-S) 8.6-50 MG tablet Take 1 tablet by mouth 2 (two) times daily. 08/26/24 11/24/24  [place], Eric J, DO  vitamin B-12 (CYANOCOBALAMIN ) 1000 MCG tablet Take 1,000 mcg by mouth daily.    [provider]  zaleplon (SONATA) 5 MG capsule Take 5 mg by mouth at bedtime. 04/04/24   [provider]    Allergies: Trazodone, Latex, Penicillins, Sm baby powder cornstarch [powders], and Tape    Review of Systems  Constitutional:  Positive for chills.  Gastrointestinal:  Positive for nausea.    Updated Vital Signs BP 137/74 (BP Location: Left Arm)   Pulse 84   Temp 97.9 F (36.6 C) (Oral)   Resp 16   SpO2 100%   Physical Exam Vitals and nursing note reviewed.  Constitutional:      General: She is not in acute distress.    Appearance: She is well-developed.     Comments: Well appearing, no active vomiting  HENT:     Head: Normocephalic and atraumatic.     Mouth/Throat:     Pharynx: No oropharyngeal exudate or posterior oropharyngeal erythema.  Eyes:     Conjunctiva/sclera: Conjunctivae normal.  Cardiovascular:     Rate and Rhythm: Normal rate and regular rhythm.     Heart sounds: No murmur heard. Pulmonary:     Effort: Pulmonary effort is normal. No respiratory distress.     Breath sounds: Normal breath sounds.  Abdominal:     General: Bowel sounds are normal.     Palpations: Abdomen is soft.     Tenderness: There is no abdominal tenderness.  Musculoskeletal:        General: No swelling.      Cervical back: Neck supple.  Skin:    General: Skin is warm and dry.     Capillary Refill: Capillary refill takes less than 2 seconds.  Neurological:     Mental Status: She is alert.  Psychiatric:        Mood and Affect: Mood normal.     (all labs ordered are listed, but only abnormal results are displayed) Labs Reviewed  LIPASE, BLOOD - Abnormal; Notable for the following components:      Result Value   Lipase 128 (*)    All other components within normal limits  COMPREHENSIVE METABOLIC PANEL WITH GFR - Abnormal;  Notable for the following components:   Glucose, Bld 122 (*)    BUN <5 (*)    Alkaline Phosphatase 597 (*)    All other components within normal limits  CBC - Abnormal; Notable for the following components:   Hemoglobin 11.0 (*)    HCT 35.1 (*)    All other components within normal limits  RESP PANEL BY RT-PCR (RSV, FLU A&B, COVID)  [id number]  URINALYSIS, ROUTINE W REFLEX MICROSCOPIC    EKG: None  Radiology: DG Chest 2 View Result Date: 10/26/2024 EXAM: 2 VIEW(S) XRAY OF THE CHEST 10/26/2024 12:17:00 PM COMPARISON: 04/19/2024 CLINICAL HISTORY: cough FINDINGS: LINES, TUBES AND DEVICES: Right-sided chest port in place with distal tip at the cavoatrial junction. LUNGS AND PLEURA: No focal pulmonary opacity. No pleural effusion. No pneumothorax. Mild right hemidiaphragm elevation. Lateral view degraded by patient arm position, not raised above the head. HEART AND MEDIASTINUM: No acute abnormality of the cardiac and mediastinal silhouettes. BONES AND SOFT TISSUES: Thoracic degenerative changes. No acute osseous abnormality. IMPRESSION: 1. No acute findings. Electronically signed by: Rockey Kilts MD MD 10/26/2024 01:18 PM EST RP Workstation: [id number]     Procedures   Medications Ordered in the ED  lactated ringers  bolus 1,000 mL (0 mLs Intravenous Stopped 10/26/24 2034)  ondansetron  (ZOFRAN ) injection 4 mg (4 mg Intravenous Given 10/26/24 1705)  HYDROmorphone  (DILAUDID )  injection 1 mg (1 mg Intravenous Given 10/26/24 1705)  HYDROmorphone  (DILAUDID ) injection 1 mg (1 mg Intravenous Given 10/26/24 1859)  prochlorperazine  (COMPAZINE ) injection 10 mg (10 mg Intravenous Given 10/26/24 2011)  diphenhydrAMINE  (BENADRYL ) injection 12.5 mg (12.5 mg Intravenous Given 10/26/24 2011)    Clinical Course as of 10/26/24 2122  Fri Oct 26, 2024  2046 Patient PO challenged and was able to tolerate water [AF]    Clinical Course User Index [AF] Veta Palma, PA-C                                 Medical Decision Making Amount and/or Complexity of Data Reviewed Labs: ordered. Radiology: ordered.  Risk Prescription drug management.     Differential diagnosis includes but is not limited to acute cholecystitis, cholelithiasis, cholangitis, choledocholithiasis, peptic ulcer, gastritis, gastroenteritis, appendicitis, IBS, IBD, DKA, nephrolithiasis, UTI, pyelonephritis, pancreatitis, diverticulitis, small bowel obstruction, volvulus, metastasis, URI, flu, COVID  ED Course:  Upon initial evaluation, patient is very well-appearing, no acute distress.  Normal vital signs aside from elevated blood pressure at 152/78 upon arrival.  Abdomen is soft and nontender.  No active vomiting.  Labs Ordered: I Ordered, and personally interpreted labs.  The pertinent results include:   CBC with no looks ptosis.  Hemoglobin 11, at baseline CMP with mildly elevated glucose of 122. Elevated alk phos of 597 which is at baseline.  No elevations in creatinine or AST, ALT, T. bili.  Normal electrolytes Lipase elevated at 128, improved from baseline.  Urinalysis without signs of infection Flu, COVID, RSV negative  Imaging Studies ordered: I ordered imaging studies including chest x-ray I independently visualized the imaging with scope of interpretation limited to determining acute life threatening conditions related to emergency care. Imaging showed no acute abnormality I agree with the  radiologist interpretation   Medications Given: LR bolus Compazine  Benadryl  Zofran  Dilaudid   Upon re-evaluation, patient initially reports her nausea was not much improved with the Zofran .  She was given Compazine  and Benadryl , and reports improvement in her nausea with these medications.  She  was p.o. challenged and tolerated water without difficulty. Patient's workup is reassuring.  No leukocytosis, tachycardia, or fevers to suggest systemic infection.  No signs of sepsis.  No abnormalities in electrolytes.  Baseline elevated alk phos and lipase secondary to patient's pancreatic cancer.  Urinalysis without signs of infection.  Patient's abdomen is soft and nontender, tolerating p.o. intake, and does report having bowel movements at home even though she says these are decreased in size over the past couple of days.  Suspect they are decreased secondary to decreased intake.  I have low clinical concern for SBO.  No indication for CT abdomen and pelvis imaging at this time. Patient's symptoms of runny nose, congestion, nausea vomiting are consistent with viral URI/viral enteritis.  Her COVID, flu, RSV testing is negative.  Suspect other viral URI.  Discussed patient with my attending Dr. Mannie who agrees with plan of care and does not recommend CT abdomen and pelvis at this time.  Patient stable and appropriate for discharge home    Impression: Viral URI  Disposition:  The patient was discharged home with instructions to use Compazine  as needed.  She would prefer a pill form instead of suppository, this was sent to her pharmacy.  Follow-up closely with her PCP within the next 5 days for recheck of her symptoms.  Eat a bland diet and then advance diet as tolerated. Return precautions given and patient verbalized understanding.     Record Review: External records from outside source obtained and reviewed including oncology notes for pancreatic cancer     This chart was dictated using  voice recognition software, Dragon. Despite the best efforts of this provider to proofread and correct errors, errors may still occur which can change documentation meaning.       Final diagnoses:  Nausea and vomiting, unspecified vomiting type    ED Discharge Orders          Ordered    prochlorperazine  (COMPAZINE ) 10 MG tablet  Every 8 hours PRN        10/26/24 2120               Veta Palma, PA-C 10/26/24 2122  "

## 2024-10-26 NOTE — ED Notes (Signed)
"  Successful PO challenge  "

## 2024-10-29 ENCOUNTER — Encounter: Payer: Self-pay | Admitting: Oncology

## 2024-10-29 NOTE — Progress Notes (Signed)
 Complex Care Management Care Guide Note  10/29/2024 Name: Jo Allen MRN: [id number] DOB: 1965-12-09  Jo Allen is a 59 y.o. year old female who is a primary care patient of [name], [name] and is actively engaged with the care management team. I reached out to [employer] by phone today to assist with re-scheduling  with the RN Case Manager.  Follow up plan: Telephone appointment with complex care management team member scheduled for:  11/05/2024  Thedford Franks, CMA, [facility]  [facility], [facility] Guide, Lead Direct Dial: [phone]  Fax: [phone]

## 2024-10-29 NOTE — Progress Notes (Signed)
 Complex Care Management Care Guide Note  10/29/2024 Name: Makaylen Thieme MRN: [id number] DOB: 1966-05-20  Aracelia Brinson is a 59 y.o. year old female who is a primary care patient of [name], [name] and is actively engaged with the care management team. I reached out to [employer] by phone today to assist with re-scheduling  with the RN Case Manager.  Follow up plan: Unsuccessful telephone outreach attempt made. A HIPAA compliant phone message was left for the patient providing contact information and requesting a return call. No further outreach attempt will be made due to inability to maintain patient contact.   Doyce Razor [facility], [facility] Guide Direct Dial:   Fax: [phone]

## 2024-10-30 ENCOUNTER — Encounter: Payer: Self-pay | Admitting: Oncology

## 2024-11-01 ENCOUNTER — Inpatient Hospital Stay: Attending: Oncology

## 2024-11-01 ENCOUNTER — Encounter: Payer: Self-pay | Admitting: Nurse Practitioner

## 2024-11-01 VITALS — BP 116/54 | HR 90 | Temp 97.5°F | Resp 18 | Wt 196.5 lb

## 2024-11-01 DIAGNOSIS — C259 Malignant neoplasm of pancreas, unspecified: Secondary | ICD-10-CM | POA: Diagnosis not present

## 2024-11-01 DIAGNOSIS — G893 Neoplasm related pain (acute) (chronic): Secondary | ICD-10-CM | POA: Diagnosis not present

## 2024-11-01 DIAGNOSIS — R630 Anorexia: Secondary | ICD-10-CM

## 2024-11-01 DIAGNOSIS — K5903 Drug induced constipation: Secondary | ICD-10-CM

## 2024-11-01 DIAGNOSIS — C787 Secondary malignant neoplasm of liver and intrahepatic bile duct: Secondary | ICD-10-CM

## 2024-11-01 DIAGNOSIS — T451X5A Adverse effect of antineoplastic and immunosuppressive drugs, initial encounter: Secondary | ICD-10-CM | POA: Diagnosis not present

## 2024-11-01 DIAGNOSIS — R530 Neoplastic (malignant) related fatigue: Secondary | ICD-10-CM

## 2024-11-01 DIAGNOSIS — Z515 Encounter for palliative care: Secondary | ICD-10-CM | POA: Diagnosis not present

## 2024-11-01 DIAGNOSIS — R634 Abnormal weight loss: Secondary | ICD-10-CM | POA: Diagnosis not present

## 2024-11-01 MED ORDER — OXYCODONE HCL 10 MG PO TABS: 10.0000 mg | ORAL_TABLET | Freq: Four times a day (QID) | ORAL | 0 refills | Status: DC | PRN

## 2024-11-01 MED ORDER — OXYCODONE HCL ER 10 MG PO T12A: 10.0000 mg | EXTENDED_RELEASE_TABLET | Freq: Two times a day (BID) | ORAL | 0 refills | Status: DC

## 2024-11-01 NOTE — Progress Notes (Signed)
 "    Palliative Medicine [facility]  [phone]) [phone] Fax:([phone]) [phone]   Name: Jo Allen Date: 11/01/2024 MRN: [id number]  DOB: Jun 05, 1966  Patient Care Team: [name], PA as PCP - General (Physician Assistant) Burnette Fallow, MD as Consulting Physician (Gastroenterology) Autumn Millman, MD as Consulting Physician (Oncology)    REASON FOR CONSULTATION: Jo Allen is a 59 y.o. 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  Palliative is seeing patient for symptom management and goals of care.    SOCIAL HISTORY:     reports that she has never smoked. She has been exposed to tobacco smoke. She has never used smokeless tobacco. She reports that she does not drink alcohol and does not use drugs.  ADVANCE DIRECTIVES:  None on file. Patient interested in completing. AD documents provided.   CODE STATUS: Full code  PAST MEDICAL HISTORY: Past Medical History:  Diagnosis Date   Acute pancreatitis 04/19/2024   Asthma    CAD (coronary artery disease)    Cancer (HCC)    GERD (gastroesophageal reflux disease)    Headache    Heart disease    Hypertension    Obesity    Paresthesia 08/20/2015   Prediabetes    Transfusion history    64yrs ago    PAST SURGICAL HISTORY:  Past Surgical History:  Procedure Laterality Date   ANGIOPLASTY     BREAST BIOPSY Left    BREAST BIOPSY WITH SENTINEL LYMPH NODE BIOPSY AND NEEDLE LOCALIZATION Left    COLONOSCOPY WITH PROPOFOL  N/A 02/19/2014   Procedure: COLONOSCOPY WITH PROPOFOL ;  Surgeon: Gladis MARLA Louder, MD;  Location: [facility];  Service: Endoscopy;  Laterality: N/A;   ESOPHAGOGASTRODUODENOSCOPY N/A 07/04/2024   Procedure: EGD (ESOPHAGOGASTRODUODENOSCOPY);  Surgeon: Burnette Fallow, MD;  Location: [facility];  Service: Gastroenterology;  Laterality: N/A;    EUS N/A 07/04/2024   Procedure: ULTRASOUND, UPPER GI TRACT, ENDOSCOPIC;  Surgeon: Burnette Fallow, MD;  Location: [facility];  Service: Gastroenterology;  Laterality: N/A;   FINE NEEDLE ASPIRATION  07/04/2024   Procedure: FINE NEEDLE ASPIRATION;  Surgeon: Burnette Fallow, MD;  Location: [facility];  Service: Gastroenterology;;   IR IMAGING GUIDED PORT INSERTION  08/27/2024   JOINT REPLACEMENT Bilateral    LEFT HEART CATH AND CORONARY ANGIOGRAPHY N/A 04/29/2023   Procedure: LEFT HEART CATH AND CORONARY ANGIOGRAPHY;  Surgeon: Ladona Heinz, MD;  Location: [facility];  Service: Cardiovascular;  Laterality: N/A;   TOTAL KNEE ARTHROPLASTY Bilateral     HEMATOLOGY/ONCOLOGY HISTORY:  Oncology History  Primary malignant neuroendocrine tumor of pancreas (HCC)  07/26/2024 Initial Diagnosis   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  MEDICATIONS:  Current Outpatient Medications  Medication Sig Dispense Refill   oxyCODONE  (OXYCONTIN ) 10 mg 12 hr tablet Take 1 tablet (10 mg total) by mouth every 12 (twelve) hours. 60 tablet 0   albuterol  (VENTOLIN  HFA) 108 (90 Base)  MCG/ACT inhaler Inhale 1-2 puffs into the lungs every 6 (six) hours as needed for wheezing or shortness of breath. 1 each 0   ALEVE  220 MG tablet Take 220-440 mg by mouth 2 (two) times daily as needed (for pain or headaches).     amLODipine  (NORVASC ) 10 MG tablet Take 1 tablet (10 mg total) by mouth daily. 90 tablet  1   Biotin  1000 MCG CHEW Chew 2,000 mcg by mouth daily.     buPROPion  (WELLBUTRIN  XL) 150 MG 24 hr tablet Take 150 mg by mouth daily.     Cholecalciferol (VITAMIN D3) 50 MCG (2000 UT) capsule Take 4,000 Units by mouth daily.     CRANBERRY PO Take 1 tablet by mouth daily.     dexamethasone  (DECADRON ) 4 MG tablet Take for 1 day starting the day after chemotherapy on day 4. Take with food. 30 tablet 1   ferrous sulfate  325 (65 FE) MG EC tablet Take 325 mg by mouth 3 (three) times a week.     gabapentin  (NEURONTIN ) 300 MG capsule Take 1 capsule (300 mg total) by mouth 3 (three) times daily. 90 capsule 3   lamoTRIgine  (LAMICTAL ) 200 MG tablet Take 200 mg by mouth 2 (two) times daily.     levocetirizine (XYZAL) 5 MG tablet Take 5 mg by mouth every evening.     lidocaine -prilocaine  (EMLA ) cream Apply 1 Application topically as needed. 30 g 0   lidocaine -prilocaine  (EMLA ) cream Apply small amount to port site 30 minutes before being accessed as directed. Place a barrier shield over site after applying to maintain medication contact with skin. 30 g 2   lipase/protease/amylase (CREON ) 36000 UNITS CPEP capsule Take 1 capsule (36,000 Units total) by mouth 3 (three) times daily with meals. 200 capsule 0   Magnesium  Gluconate (MAGNESIUM  27 PO) Take 1 tablet by mouth at bedtime.     meloxicam (MOBIC) 15 MG tablet Take 15 mg by mouth daily.     metoprolol  succinate (TOPROL  XL) 50 MG 24 hr tablet Take 1 tablet (50 mg total) by mouth daily. Take with or immediately following a meal. 90 tablet 0   nitroGLYCERIN  (NITROSTAT ) 0.4 MG SL tablet Place 1 tablet (0.4 mg total) under the tongue every 5 (five) minutes as needed for chest pain. If you require more than two tablets five minutes apart go to the nearest ER via EMS. 30 tablet 0   ondansetron  (ZOFRAN ) 8 MG tablet Take 1 tablet (8 mg total) by mouth every 8 (eight) hours as needed for nausea or vomiting. Start on the third day after cisplatin . 30 tablet 1   Oxycodone   HCl 10 MG TABS Take 1 tablet (10 mg total) by mouth every 6 (six) hours as needed. 90 tablet 0   pantoprazole  (PROTONIX ) 40 MG tablet Take 1 tablet (40 mg total) by mouth daily. 90 tablet 0   polyethylene glycol (MIRALAX  / GLYCOLAX ) 17 g packet Take 17 g by mouth daily as needed for mild constipation. 30 each 2   prochlorperazine  (COMPAZINE ) 10 MG tablet Take 1 tablet (10 mg total) by mouth every 8 (eight) hours as needed for nausea or vomiting. 15 tablet 1   QUEtiapine  (SEROQUEL ) 100 MG tablet Take 100 mg by mouth at bedtime.     rosuvastatin  (CRESTOR ) 20 MG tablet Take 20 mg by mouth at bedtime.     senna-docusate (SENOKOT-S) 8.6-50 MG tablet Take 1 tablet by mouth 2 (two) times daily. 60 tablet 2   vitamin B-12 (CYANOCOBALAMIN ) 1000  MCG tablet Take 1,000 mcg by mouth daily.     zaleplon (SONATA) 5 MG capsule Take 5 mg by mouth at bedtime.     No current facility-administered medications for this visit.    VITAL SIGNS: BP (!) 116/54 (BP Location: Right Arm, Patient Position: Sitting, Cuff Size: Large)   Pulse 90   Temp (!) 97.5 F (36.4 C) (Temporal)   Resp 18   Wt 196 lb 8 oz (89.1 kg)   SpO2 97%   BMI 33.73 kg/m  Filed Weights   11/01/24 0956  Weight: 196 lb 8 oz (89.1 kg)    Estimated body mass index is 33.73 kg/m as calculated from the following:   Height as of 10/12/24: 5' 4 (1.626 m).   Weight as of this encounter: 196 lb 8 oz (89.1 kg).  LABS: CBC:    Component Value Date/Time   WBC 4.4 10/26/2024 1657   HGB 11.0 (L) 10/26/2024 1657   HGB 11.5 (L) 10/12/2024 0928   HGB 13.2 04/26/2023 0743   HCT 35.1 (L) 10/26/2024 1657   HCT 41.4 04/26/2023 0743   PLT 161 10/26/2024 1657   PLT 395 10/12/2024 0928   PLT 274 04/26/2023 0743   MCV 86.2 10/26/2024 1657   MCV 85 04/26/2023 0743   NEUTROABS 2.7 10/12/2024 0928   LYMPHSABS 1.9 10/12/2024 0928   MONOABS 0.8 10/12/2024 0928   EOSABS 0.1 10/12/2024 0928   BASOSABS 0.0 10/12/2024 0928   Comprehensive Metabolic  Panel:    Component Value Date/Time   NA 140 10/26/2024 1657   NA 142 04/26/2023 0743   K 3.8 10/26/2024 1657   CL 103 10/26/2024 1657   CO2 26 10/26/2024 1657   BUN <5 (L) 10/26/2024 1657   BUN 11 04/26/2023 0743   CREATININE 0.54 10/26/2024 1657   CREATININE 0.68 10/12/2024 0928   GLUCOSE 122 (H) 10/26/2024 1657   CALCIUM  10.0 10/26/2024 1657   AST 37 10/26/2024 1657   AST 46 (H) 10/12/2024 0928   ALT 27 10/26/2024 1657   ALT 29 10/12/2024 0928   ALKPHOS 597 (H) 10/26/2024 1657   BILITOT 0.3 10/26/2024 1657   BILITOT 0.3 10/12/2024 0928   PROT 7.0 10/26/2024 1657   PROT 5.4 (L) 04/26/2023 0743   ALBUMIN 3.8 10/26/2024 1657   ALBUMIN 3.6 (L) 04/26/2023 0743    RADIOGRAPHIC STUDIES: DG Chest 2 View Result Date: 10/26/2024 EXAM: 2 VIEW(S) XRAY OF THE CHEST 10/26/2024 12:17:00 PM COMPARISON: 04/19/2024 CLINICAL HISTORY: cough FINDINGS: LINES, TUBES AND DEVICES: Right-sided chest port in place with distal tip at the cavoatrial junction. LUNGS AND PLEURA: No focal pulmonary opacity. No pleural effusion. No pneumothorax. Mild right hemidiaphragm elevation. Lateral view degraded by patient arm position, not raised above the head. HEART AND MEDIASTINUM: No acute abnormality of the cardiac and mediastinal silhouettes. BONES AND SOFT TISSUES: Thoracic degenerative changes. No acute osseous abnormality. IMPRESSION: 1. No acute findings. Electronically signed by: Rockey Kilts MD MD 10/26/2024 01:18 PM EST RP Workstation: [id number]    PERFORMANCE STATUS (ECOG) : 1 - Symptomatic but completely ambulatory  Review of Systems  Constitutional:  Positive for activity change, appetite change and fatigue.  Musculoskeletal:  Positive for arthralgias.  Unless otherwise noted, a complete review of systems is negative.  Physical Exam General: NAD Cardiovascular: regular rate and rhythm Pulmonary: clear ant fields Abdomen: soft, nontender, + bowel sounds Extremities: no edema, no joint  deformities Skin: no rashes Neurological: Alert and oriented x3  Discussed the use of AI scribe  software for clinical note transcription with the patient, who gave verbal consent to proceed.  History of Present Illness Jo Allen is a 59 year old female with poorly differentiated neuroendocrine carcinoma who presents to clinic for her initial palliative visit. No acute distress noted. She is accompanied by her daughter-in-law,  Jo Allen who assist with her care. Patient is alert and able to engage appropriately in discussions.   I introduced myself, Jo Allen, and Palliative's role in collaboration with the oncology team. Concept of Palliative Care was introduced as specialized medical care for people and their families living with serious illness.  It focuses on providing relief from the symptoms and stress of a serious illness.  The goal is to improve quality of life for both the patient and the family. Values and goals of care important to patient and family were attempted to be elicited.  Jo Allen lives in the home with her son and his wife. He is the only child. She has 6 grandchildren. 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 She is of Christian faith. Enjoys being active in church although since starting treatment she has not been able to attend.   At home she is able to perform most ADLs independently with some limitations due to pain and fatigue.   She has a poor appetite, with food smells causing nausea and gagging. She consumes Ensure twice daily to maintain nutrition and occasionally eats applesauce, puddings, and popsicles. Her weight has decreased from 203 lbs on December 26th to 196 lbs today. Despite using Zofran  and Compazine , which she alternates every six to eight hours, she experiences persistent nausea. A metallic taste in her mouth affects her ability to enjoy food. Education provided on the use of warm teas with lemon, small frequent meals, ginger and lemon candies.  Jo Allen shares patient has not taken her antiemetics consistently and feels they can better manage medications on days she is having increased nausea.   She experiences both constipation and diarrhea. She uses Clearlax, which she finds unpalatable, and has not had a bowel movement in two days. She also takes Senna occasionally but not daily. We discussed the importance of consistent bowel regimen in the setting of opioid use. I recommended Miralax  twice daily with Senna 2 tablets at bedtime. She verbalized understanding.   Her sleep is disrupted, waking several times a week and having difficulty returning to sleep. She attributes some of this to discomfort from her pain. Her chemotherapy port can be painful, especially when lying on her side, which is her preferred sleeping position. Education provided on supportive sleeping positions.   We discussed Jo Allen pain at length. She experiences significant pain primarily on her left side, lower back, knees, and shoulders. The pain is sharp and stabbing, accompanied by muscle spasms. She has gone to the emergency room for pain in the past. She was previously on morphine  extended release but could not tolerate it due to side effects such as headaches, severe dizziness, and nausea. She and family express concerns about how she was feeling and tolerating and wish not to re-attempt. She is also taking oxycodone  10 mg every six hours as needed, typically three times a day, but has run out of her medication. She also takes gabapentin  300mg  three times daily.   Jo Allen reports her pain is not well controlled on oxycodone  despite recent increase up to 10mg . Education provided on the use of Oxycontin  extended-release every 12 hours. We discussed administration, efficacy, and potential side effects.  Which I am hopeful patient will be able to tolerate given her tolerance to oxycodone  IR.  She and family verbalized understanding.  Prescription has been sent to pharmacy and  they are aware this may require insurance authorization which can take several days.  Patient aware to notify office if authorization is requested.  Goals of Care  We discussed Jo Allen current illness and what it means in the larger context of her on-going co-morbidities. Natural disease trajectory and expectations were discussed.  Patient is realistic in her understanding of current state of cancer and all therapies being palliative focus. She chooses to focus on her journey one day at a time, remaining hopeful for stability.  Patient is clear and expressed wishes to continue to treat the treatable allow her every opportunity to continue to thrive.  I empathetically approach discussions regarding advance directives.  Patient states she does not have documents in place however have been in discussions with Olam, LCSW.  Jo Allen and her family asked appropriate questions.  Advance directive packet provided in addition to education.  Patient has expressed desires to further have discussions with Olam with plans on completing documents in the future.  I discussed the importance of continued conversation with family and their medical providers regarding overall plan of care and treatment options, ensuring decisions are within the context of the patients values and GOCs. Assessment & Plan Established therapeutic relationship. Education provided on palliative's role in collaboration with their Oncology/Radiation team.  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 Experiencing side effects including nausea, vomiting, anorexia, and weight loss. Avoiding social interactions due to fear of infection from chemotherapy. - Encouraged small, frequent meals and snacks to manage decreased appetite. - Advised on using ginger and lemon candies to neutralize taste changes and reduce nausea. - Recommended hot tea with lemon and  honey to neutralize taste buds.  Neoplasm related pain Pain primarily in the left side, lower back, knees, and shoulders. Previously on morphine  extended release, which was not tolerated due to side effects. Currently on oxycodone  immediate release, which is effective but requires frequent dosing. Transitioning to oxycodone  extended release to improve pain control and reduce dosing frequency. - Initiated oxycodone  extended release 10 mg twice daily, every 12 hours. - Continued oxycodone  immediate release 10 mg every 6 hours as needed. - Instructed to monitor for tolerance and effectiveness of extended release formulation. - Advised to contact provider if extended release is not tolerated.  Chemotherapy-induced nausea and vomiting Persistent nausea and vomiting despite alternating Zofran  and Compazine . Nausea exacerbated by food smells and changes in taste. - Continue alternating Zofran  and Compazine , with Zofran  every 8 hours and Compazine  every 6 hours. - Advised on using ginger and lemon candies to manage nausea and taste changes. - Encouraged small, frequent meals and snacks.  Cancer-related anorexia and weight loss Significant weight loss from 203 lbs in December to 196 lbs currently. Poor appetite and aversion to food smells. Consuming Ensure twice daily and small meals. - Continue Ensure twice daily. - Encouraged small, frequent meals and snacks. - Advised on using ginger and lemon candies to improve taste and reduce nausea.  Opioid-induced constipation and diarrhea Experiencing both constipation and diarrhea. Current use of Clearlax is not well tolerated due to taste and smell. Miralax  recommended as a more palatable alternative. - Start Miralax  or generic Miralax  twice daily. - If Miralax  is not effective, increase to twice daily or consider Senna, starting with two tablets  at bedtime, increasing to two tablets twice daily if needed.  Palliative care management Focus on symptom  management and quality of life. Discussion of healthcare power of attorney and advanced directives with chaplain involvement. - Coordinated with chaplain for healthcare power of attorney and advanced directives. - Provided documents for review and discussion with chaplain. - Ensured prescriptions are filled and managed appropriately.  Follow-Up I will plan to see patient back in 2-3 weeks. Sooner if needed.   Patient expressed understanding and was in agreement with this plan. She also understands that She can call the clinic at any time with any questions, concerns, or complaints.   Thank you for your referral and allowing Palliative to assist in Jo Allen's care.   Number and complexity of problems addressed: HIGH - 1 or more chronic illnesses with SEVERE exacerbation, progression, or side effects of treatment - advanced cancer, pain. Any controlled substances utilized were prescribed in the context of palliative care.  I personally spent a total of 65 minutes in the care of the patient today including preparing to see the patient, getting/reviewing separately obtained history, performing a medically appropriate exam/evaluation, counseling and educating, placing orders, referring and communicating with other health care professionals, documenting clinical information in the EHR, independently interpreting results, and coordinating care. Visit consisted of counseling and education dealing with the complex and emotionally intense issues of symptom management and palliative care in the setting of serious and potentially life-threatening illness.  Signed by: Levon Borer, AGPCNP-BC Palliative Medicine Team/[facility]    "

## 2024-11-02 ENCOUNTER — Encounter: Payer: Self-pay | Admitting: General Practice

## 2024-11-02 NOTE — Progress Notes (Signed)
 [facility] Spiritual Care Note  Referred by Palliative Medicine Team for follow-up support regarding Advance Care Planning and patient's goal to focus on one day at a time, staying present to what is possible and enjoyable now. Left voicemail with direct number, encouraging return call to set up an appointment together.  [address] [name], [place], [facility] Pager [phone] Voicemail [phone]

## 2024-11-05 ENCOUNTER — Other Ambulatory Visit: Payer: Self-pay

## 2024-11-05 ENCOUNTER — Telehealth: Payer: Self-pay

## 2024-11-05 ENCOUNTER — Encounter: Payer: Self-pay | Admitting: General Practice

## 2024-11-05 NOTE — Patient Outreach (Deleted)
 Complex Care Management   Visit Note  11/05/2024  Name:  Jo Allen MRN: [id number] DOB: [birth year]  Situation: Referral received for Complex Care Management related to {Criteria:32550} I obtained verbal consent from {CHL AMB Patient/Caregiver:28184}.  Visit completed with {CHL AMB Patient/Caregiver:28184}  {VISIT LOCATION:32553}  Background:   Past Medical History:  Diagnosis Date   Acute pancreatitis 04/19/2024   Asthma    CAD (coronary artery disease)    Cancer (HCC)    GERD (gastroesophageal reflux disease)    Headache    Heart disease    Hypertension    Obesity    Paresthesia 08/20/2015   Prediabetes    Transfusion history    48yrs ago    Assessment: Patient Reported Symptoms:  Cognitive        Neurological      HEENT        Cardiovascular      Respiratory      Endocrine      Gastrointestinal        Genitourinary      Integumentary      Musculoskeletal          Psychosocial            11/05/2024    PHQ2-9 Depression Screening   Little interest or pleasure in doing things    Feeling down, depressed, or hopeless    PHQ-2 - Total Score    Trouble falling or staying asleep, or sleeping too much    Feeling tired or having little energy    Poor appetite or overeating     Feeling bad about yourself - or that you are a failure or have let yourself or your family down    Trouble concentrating on things, such as reading the newspaper or watching television    Moving or speaking so slowly that other people could have noticed.  Or the opposite - being so fidgety or restless that you have been moving around a lot more than usual    Thoughts that you would be better off dead, or hurting yourself in some way    PHQ2-9 Total Score    If you checked off any problems, how difficult have these problems made it for you to do your work, take care of things at home, or get along with other people    Depression Interventions/Treatment      There were no  vitals filed for this visit.    Medications Reviewed Today   Medications were not reviewed in this encounter     Recommendation:   {RECOMMENDATONS:32554}  Follow Up Plan:   {FOLLOWUP:32559}  SIG ***

## 2024-11-05 NOTE — Patient Instructions (Signed)
 Cassidy Amrein - I am sorry I was unable to reach you today for our scheduled appointment. I work with [name], PA and am calling to support your healthcare needs. Please contact me at [phone] at your earliest convenience. I look forward to speaking with you soon.   Thank you,  Warren Quivers RN CM Population Health-Complex Care Management [facility] [phone]

## 2024-11-05 NOTE — Patient Instructions (Signed)
 Visit Information  Thank you for taking time to visit with me today. Please don't hesitate to contact me if I can be of assistance to you before our next scheduled appointment.  Our next appointment is by telephone on 11/16/24 at 10 am. Please call the care guide team at [phone] if you need to cancel or reschedule your appointment.   Following is a copy of your care plan:   Goals Addressed             This Visit's Progress    VBCI RN Care Plan- Pancreatic mass/chemo/education       Problems:  Chronic Disease Management support and education needs related to pancreatic mass/ongoing treatment with chemo.  Goal: Over the next 90 days the Patient will attend all scheduled medical appointments: with providers and specialists pertaining to health maintenance as evidenced by patient report and/or chart review.         continue to work with Medical Illustrator and/or Social Worker to address care management and care coordination needs related to pancreatic cancer/ongoing chemo treatment as evidenced by adherence to care management team scheduled appointments     demonstrate Ongoing adherence to prescribed treatment plan for pancreatic cancer as evidenced by patient report and/or chart review. demonstrate Ongoing health management independence as evidenced by patient keeping all appointments with providers and chemo infusions, and maintaining medication compliance.        take all medications exactly as prescribed and will call provider for medication related questions as evidenced by patient report.    verbalize basic understanding of pancreatic cancer disease process and self health management plan as evidenced by patient following medication/treatment plan and reporting any changes in health to provider.  verbalize understanding of plan for management of pancreatic cancer as evidenced by patient following treatment plan and reporting any needs/changes to providers.   Interventions:    Oncology: Assessment of understanding of oncology diagnosis: pancreatic cancer Assessed patient understanding of cancer diagnosis and recommended treatment plan Reviewed upcoming provider appointments and treatment appointments Assessed available transportation to appointments and treatments. Has consistent/reliable transportation: Yes Assessed support system. Has consistent/reliable family or other support: Yes PHQ2/PHQ9 performed  Patient Self-Care Activities:  Attend all scheduled provider appointments Call pharmacy for medication refills 3-7 days in advance of running out of medications Call provider office for new concerns or questions  Perform all self care activities independently  Take medications as prescribed    Plan:  Telephone follow up appointment with care management team member scheduled for:  11/16/24 at 10 am        Please call the Suicide and Crisis Lifeline: 988 call the [place]  National Suicide Prevention Lifeline: 847-086-3191 or TTY: 775-660-1529 TTY 445 315 0509) to talk to a trained counselor call 1-800-273-TALK (toll free, 24 hour hotline) if you are experiencing a Mental Health or Behavioral Health Crisis or need someone to talk to.  Patient verbalized understanding of Care plan and visit instructions communicated this visit  Warren Quivers RN CM Population Health-Complex Care Management [facility] [phone]

## 2024-11-05 NOTE — Patient Outreach (Signed)
 Complex Care Management   Visit Note  11/05/2024  Name:  Jo Allen MRN: [id number] DOB: [birth year]  Situation: Referral received for Complex Care Management related to pancreatic cancer, support and education. I obtained verbal consent from Patient.  Visit completed with Patient  on the phone  Background:   Past Medical History:  Diagnosis Date   Acute pancreatitis 04/19/2024   Asthma    CAD (coronary artery disease)    Cancer (HCC)    GERD (gastroesophageal reflux disease)    Headache    Heart disease    Hypertension    Obesity    Paresthesia 08/20/2015   Prediabetes    Transfusion history    48yrs ago    Assessment: Patient Reported Symptoms:  Cognitive Cognitive Status: No symptoms reported, Alert and oriented to person, place, and time, Normal speech and language skills Cognitive/Intellectual Conditions Management [RPT]: None reported or documented in medical history or problem list   Health Maintenance Behaviors: Healthy diet, Sleep adequate, Stress management, Annual physical exam Healing Pattern: Average Health Facilitated by: Healthy diet, Pain control, Rest, Stress management  Neurological Neurological Review of Symptoms: No symptoms reported Neurological Management Strategies: Adequate rest, Medication therapy, Routine screening Neurological Self-Management Outcome: 4 (good)  HEENT HEENT Symptoms Reported: Tinnitus (Ringing in ears at times.) HEENT Management Strategies: Routine screening HEENT Self-Management Outcome: 4 (good)    Cardiovascular Cardiovascular Symptoms Reported: Swelling in legs or feet (States she has bilateral swelling in feet and legs at times. None reported today at time of visit.) Does patient have uncontrolled Hypertension?: No Cardiovascular Management Strategies: Medication therapy, Routine screening, Adequate rest, Diet modification Weight: 193 lb (87.5 kg) Cardiovascular Self-Management Outcome: 4 (good)  Respiratory Other  Respiratory Symptoms: Reports coughing at times. States it comes and goes. None reported today at visit. Respiratory Management Strategies: Routine screening, Adequate rest Respiratory Self-Management Outcome: 3 (uncertain)  Endocrine Endocrine Symptoms Reported: No symptoms reported Is patient diabetic?: No Endocrine Self-Management Outcome: 4 (good)  Gastrointestinal Gastrointestinal Symptoms Reported: No symptoms reported Additional Gastrointestinal Details: Reports normal BM's. Last reported today 11/05/24. Patient takes Miralax  and senna daily. Gastrointestinal Management Strategies: Adequate rest, Medication therapy Gastrointestinal Self-Management Outcome: 4 (good)    Genitourinary Genitourinary Symptoms Reported: No symptoms reported Genitourinary Self-Management Outcome: 4 (good)  Integumentary Integumentary Symptoms Reported: Other Other Integumentary Symptoms: Patient has port on right site of chest. Skin Management Strategies: Routine screening Skin Self-Management Outcome: 4 (good)  Musculoskeletal Musculoskelatal Symptoms Reviewed: Back pain, Joint pain, Muscle pain, Unsteady gait, Limited mobility Additional Musculoskeletal Details: Has chronic back and joint pain and unsteady gait. Gets SOB with exertion Musculoskeletal Management Strategies: Adequate rest, Routine screening Musculoskeletal Self-Management Outcome: 3 (uncertain) Falls in the past year?: No Number of falls in past year: 1 or less Was there an injury with Fall?: No Fall Risk Category Calculator: 0 Patient Fall Risk Level: Low Fall Risk    Psychosocial Psychosocial Symptoms Reported: No symptoms reported Behavioral Management Strategies: Medication therapy Behavioral Health Self-Management Outcome: 4 (good)   Quality of Family Relationships: supportive, involved, helpful Do you feel physically threatened by others?: No    11/05/2024    PHQ2-9 Depression Screening   Little interest or pleasure in doing  things Not at all  Feeling down, depressed, or hopeless Not at all  PHQ-2 - Total Score 0  Trouble falling or staying asleep, or sleeping too much    Feeling tired or having little energy    Poor appetite or overeating     Feeling  bad about yourself - or that you are a failure or have let yourself or your family down    Trouble concentrating on things, such as reading the newspaper or watching television    Moving or speaking so slowly that other people could have noticed.  Or the opposite - being so fidgety or restless that you have been moving around a lot more than usual    Thoughts that you would be better off dead, or hurting yourself in some way    PHQ2-9 Total Score    If you checked off any problems, how difficult have these problems made it for you to do your work, take care of things at home, or get along with other people    Depression Interventions/Treatment      Today's Vitals   11/05/24 1208  BP: 103/70  Weight:    Pain Scale: 0-10 Pain Score: 0-No pain  Medications Reviewed Today     Reviewed by Leodis Warren DEL, RN (Registered Nurse) on 11/05/24 at 1158  Med List Status: <None>   Medication Order Taking? Sig Documenting Provider Last Dose Status Informant  albuterol  (VENTOLIN  HFA) 108 (90 Base) MCG/ACT inhaler [id number] Yes Inhale 1-2 puffs into the lungs every 6 (six) hours as needed for wheezing or shortness of breath. Billy Asberry FALCON, PA-C  Active Self  ALEVE  220 MG tablet [id number] Yes Take 220-440 mg by mouth 2 (two) times daily as needed (for pain or headaches). [provider]  Active Self  amLODipine  (NORVASC ) 10 MG tablet [id number] Yes Take 1 tablet (10 mg total) by mouth daily. Ladona Heinz, MD  Active Self  Biotin  1000 MCG CHEW [id number] Yes Chew 2,000 mcg by mouth daily. [provider]  Active Self  buPROPion  (WELLBUTRIN  XL) 150 MG 24 hr tablet [id number] Yes Take 150 mg by mouth daily. [provider]  Active Self           Med  Note [name], [name]   Wed Aug 22, 2024  8:46 PM)    Cholecalciferol (VITAMIN D3) 50 MCG (2000 UT) capsule [id number] Yes Take 4,000 Units by mouth daily. [provider]  Active Self  CRANBERRY PO [id number] Yes Take 1 tablet by mouth daily. [provider]  Active Self           Med Note [name], [name] Schaumann Aug 23, 2024  2:41 PM) Strength not stated  dexamethasone  (DECADRON ) 4 MG tablet [id number] Yes Take for 1 day starting the day after chemotherapy on day 4. Take with food. Autumn Millman, MD  Active Self           Med Note [name], [name]   Mon Aug 27, 2024 12:39 PM) Has not started yet  ferrous sulfate  325 (65 FE) MG EC tablet [id number] Yes Take 325 mg by mouth 3 (three) times a week. [provider]  Active Self  gabapentin  (NEURONTIN ) 300 MG capsule [id number] Yes Take 1 capsule (300 mg total) by mouth 3 (three) times daily. Pasam, Millman, MD  Active   lamoTRIgine  (LAMICTAL ) 200 MG tablet [id number] Yes Take 200 mg by mouth 2 (two) times daily. [provider]  Active Self  levocetirizine (XYZAL) 5 MG tablet [id number] Yes Take 5 mg by mouth every evening. [provider]  Active Self  lidocaine -prilocaine  (EMLA ) cream [id number] Yes Apply 1 Application topically as needed. Pasam, Millman, MD  Active   lidocaine -prilocaine  (EMLA ) cream [id number] Yes Apply small amount to port site 30  minutes before being accessed as directed. Place a barrier shield over site after applying to maintain medication contact with skin. Pasam, Chinita, MD  Active   lipase/protease/amylase (CREON ) 36000 UNITS CPEP capsule [id number] Yes Take 1 capsule (36,000 Units total) by mouth 3 (three) times daily with meals. Pasam, Chinita, MD  Active   Magnesium  Gluconate (MAGNESIUM  27 PO) [id number] Yes Take 1 tablet by mouth at bedtime. [provider]  Active Self  meloxicam (MOBIC) 15 MG tablet [id number] Yes Take 15 mg by mouth daily. [provider]  Active Self   metoprolol  succinate (TOPROL  XL) 50 MG 24 hr tablet [id number] Yes Take 1 tablet (50 mg total) by mouth daily. Take with or immediately following a meal. [place], Camellia PARAS, DO  Active   nitroGLYCERIN  (NITROSTAT ) 0.4 MG SL tablet [id number] Yes Place 1 tablet (0.4 mg total) under the tongue every 5 (five) minutes as needed for chest pain. If you require more than two tablets five minutes apart go to the nearest ER via EMS. [place], Camellia PARAS, DO  Active            Med Note [name], [name]   Mon Aug 27, 2024 12:37 PM) Has not needed  ondansetron  (ZOFRAN ) 8 MG tablet [id number] Yes Take 1 tablet (8 mg total) by mouth every 8 (eight) hours as needed for nausea or vomiting. Start on the third day after cisplatin . [place], Camellia PARAS, DO  Active            Med Note [name], [name]   Mon Aug 27, 2024 12:37 PM) Has not started yet  oxyCODONE  (OXYCONTIN ) 10 mg 12 hr tablet [id number] Yes Take 1 tablet (10 mg total) by mouth every 12 (twelve) hours. Pickenpack-Cousar, Fannie [name], NP  Active   Oxycodone  HCl 10 MG TABS [id number] Yes Take 1 tablet (10 mg total) by mouth every 6 (six) hours as needed. Pickenpack-Cousar, Fannie [name], NP  Active   pantoprazole  (PROTONIX ) 40 MG tablet [id number] Yes Take 1 tablet (40 mg total) by mouth daily. [place], Camellia PARAS, DO  Active   polyethylene glycol (MIRALAX  / GLYCOLAX ) 17 g packet [id number] Yes Take 17 g by mouth daily as needed for mild constipation. [place], Camellia PARAS, DO  Active   prochlorperazine  (COMPAZINE ) 10 MG tablet [id number] Yes Take 1 tablet (10 mg total) by mouth every 8 (eight) hours as needed for nausea or vomiting. Veta Palma, PA-C  Active   QUEtiapine  (SEROQUEL ) 100 MG tablet [id number] Yes Take 100 mg by mouth at bedtime. [provider]  Active Self  rosuvastatin  (CRESTOR ) 20 MG tablet [id number] Yes Take 20 mg by mouth at bedtime. [provider]  Active Self  senna-docusate (SENOKOT-S) 8.6-50 MG tablet [id number] Yes Take 1 tablet by mouth 2 (two)  times daily. [place], Camellia PARAS, DO  Active   vitamin B-12 (CYANOCOBALAMIN ) 1000 MCG tablet [id number] Yes Take 1,000 mcg by mouth daily. [provider]  Active Self  zaleplon (SONATA) 5 MG capsule [id number] Yes Take 5 mg by mouth at bedtime. [provider]  Active Self            Recommendation:   Continue Current Plan of Care  Follow Up Plan:   Telephone follow up appointment date/time:  11/16/24 at 10 am.  Warren Quivers RN CM Population Health-Complex Care Management [facility] [phone]

## 2024-11-05 NOTE — Progress Notes (Signed)
 [facility] Spiritual Care Note  Received and returned voicemail. Plan to phone again later in week as needed for follow-up support.  [address] [name], [place], [facility] Pager [phone] Voicemail [phone]

## 2024-11-06 ENCOUNTER — Inpatient Hospital Stay

## 2024-11-06 ENCOUNTER — Other Ambulatory Visit: Payer: Self-pay

## 2024-11-06 DIAGNOSIS — C7A8 Other malignant neuroendocrine tumors: Secondary | ICD-10-CM

## 2024-11-06 LAB — CMP (CANCER CENTER ONLY)
ALT: 35 U/L (ref 0–44)
AST: 57 U/L — ABNORMAL HIGH (ref 15–41)
Albumin: 4 g/dL (ref 3.5–5.0)
Alkaline Phosphatase: 543 U/L — ABNORMAL HIGH (ref 38–126)
Anion gap: 10 (ref 5–15)
BUN: 7 mg/dL (ref 6–20)
CO2: 28 mmol/L (ref 22–32)
Calcium: 10.1 mg/dL (ref 8.9–10.3)
Chloride: 102 mmol/L (ref 98–111)
Creatinine: 0.63 mg/dL (ref 0.44–1.00)
GFR, Estimated: >60 mL/min
Glucose, Bld: 156 mg/dL — ABNORMAL HIGH (ref 70–99)
Potassium: 3.7 mmol/L (ref 3.5–5.1)
Sodium: 140 mmol/L (ref 135–145)
Total Bilirubin: 0.2 mg/dL (ref 0.0–1.2)
Total Protein: 6.7 g/dL (ref 6.5–8.1)

## 2024-11-06 LAB — CBC WITH DIFFERENTIAL (CANCER CENTER ONLY)
Abs Immature Granulocytes: 0.08 K/uL — ABNORMAL HIGH (ref 0.00–0.07)
Basophils Absolute: 0 K/uL (ref 0.0–0.1)
Basophils Relative: 1 %
Eosinophils Absolute: 0 K/uL (ref 0.0–0.5)
Eosinophils Relative: 1 %
HCT: 35.3 % — ABNORMAL LOW (ref 36.0–46.0)
Hemoglobin: 11.4 g/dL — ABNORMAL LOW (ref 12.0–15.0)
Immature Granulocytes: 2 %
Lymphocytes Relative: 43 %
Lymphs Abs: 2.2 K/uL (ref 0.7–4.0)
MCH: 27.3 pg (ref 26.0–34.0)
MCHC: 32.3 g/dL (ref 30.0–36.0)
MCV: 84.7 fL (ref 80.0–100.0)
Monocytes Absolute: 0.8 K/uL (ref 0.1–1.0)
Monocytes Relative: 16 %
Neutro Abs: 1.8 K/uL (ref 1.7–7.7)
Neutrophils Relative %: 37 %
Platelet Count: 406 K/uL — ABNORMAL HIGH (ref 150–400)
RBC: 4.17 MIL/uL (ref 3.87–5.11)
RDW: 15.2 % (ref 11.5–15.5)
WBC Count: 4.9 K/uL (ref 4.0–10.5)
nRBC: 0 % (ref 0.0–0.2)

## 2024-11-06 LAB — MAGNESIUM: Magnesium: 1.7 mg/dL (ref 1.7–2.4)

## 2024-11-07 ENCOUNTER — Other Ambulatory Visit (HOSPITAL_COMMUNITY): Payer: Self-pay

## 2024-11-07 ENCOUNTER — Encounter: Payer: Self-pay | Admitting: Oncology

## 2024-11-07 ENCOUNTER — Encounter: Payer: Self-pay | Admitting: General Practice

## 2024-11-07 ENCOUNTER — Other Ambulatory Visit: Payer: Self-pay

## 2024-11-07 ENCOUNTER — Inpatient Hospital Stay: Admitting: Oncology

## 2024-11-07 ENCOUNTER — Other Ambulatory Visit: Payer: Self-pay | Admitting: Nurse Practitioner

## 2024-11-07 ENCOUNTER — Inpatient Hospital Stay

## 2024-11-07 VITALS — BP 118/62 | HR 92 | Temp 98.1°F | Resp 18 | Wt 199.5 lb

## 2024-11-07 DIAGNOSIS — C7A8 Other malignant neuroendocrine tumors: Secondary | ICD-10-CM | POA: Diagnosis not present

## 2024-11-07 DIAGNOSIS — Z515 Encounter for palliative care: Secondary | ICD-10-CM

## 2024-11-07 DIAGNOSIS — G893 Neoplasm related pain (acute) (chronic): Secondary | ICD-10-CM

## 2024-11-07 DIAGNOSIS — C259 Malignant neoplasm of pancreas, unspecified: Secondary | ICD-10-CM

## 2024-11-07 MED ORDER — APREPITANT 130 MG/18ML IV EMUL
130.0000 mg | Freq: Once | INTRAVENOUS | Status: AC
  Administered 2024-11-07: 130 mg via INTRAVENOUS
  Filled 2024-11-07: qty 18

## 2024-11-07 MED ORDER — SODIUM CHLORIDE 0.9 % IV SOLN
100.0000 mg/m2 | Freq: Once | INTRAVENOUS | Status: AC
  Administered 2024-11-07: 200 mg via INTRAVENOUS
  Filled 2024-11-07: qty 10

## 2024-11-07 MED ORDER — PALONOSETRON HCL INJECTION 0.25 MG/5ML
0.2500 mg | Freq: Once | INTRAVENOUS | Status: AC
  Administered 2024-11-07: 0.25 mg via INTRAVENOUS
  Filled 2024-11-07: qty 5

## 2024-11-07 MED ORDER — MAGNESIUM SULFATE 2 GM/50ML IV SOLN
2.0000 g | Freq: Once | INTRAVENOUS | Status: AC
  Administered 2024-11-07: 2 g via INTRAVENOUS
  Filled 2024-11-07: qty 50

## 2024-11-07 MED ORDER — SODIUM CHLORIDE 0.9 % IV SOLN
75.0000 mg/m2 | Freq: Once | INTRAVENOUS | Status: AC
  Administered 2024-11-07: 150 mg via INTRAVENOUS
  Filled 2024-11-07: qty 150

## 2024-11-07 MED ORDER — TRAMADOL HCL ER 100 MG PO TB24: 100.0000 mg | ORAL_TABLET | Freq: Every day | ORAL | 0 refills | Status: DC

## 2024-11-07 MED ORDER — TRAMADOL HCL ER 100 MG PO TB24
100.0000 mg | ORAL_TABLET | Freq: Two times a day (BID) | ORAL | 0 refills | Status: DC
  Filled 2024-11-07: qty 60, 30d supply, fill #0

## 2024-11-07 MED ORDER — SODIUM CHLORIDE 0.9 % IV SOLN: INTRAVENOUS | Status: DC

## 2024-11-07 MED ORDER — TRAMADOL HCL ER 100 MG PO TB24
100.0000 mg | ORAL_TABLET | Freq: Every day | ORAL | 0 refills | Status: AC
  Filled 2024-11-07: qty 30, 30d supply, fill #0

## 2024-11-07 MED ORDER — POTASSIUM CHLORIDE IN NACL 20-0.9 MEQ/L-% IV SOLN
Freq: Once | INTRAVENOUS | Status: AC
  Filled 2024-11-07: qty 1000

## 2024-11-07 MED ORDER — DEXAMETHASONE SOD PHOSPHATE PF 10 MG/ML IJ SOLN
10.0000 mg | Freq: Once | INTRAMUSCULAR | Status: AC
  Administered 2024-11-07: 10 mg via INTRAVENOUS
  Filled 2024-11-07: qty 1

## 2024-11-07 NOTE — Progress Notes (Signed)
 Patient only had output of urine since arrival to clinic. Dr Autumn made aware. Order given to proceed with Cisplatin  infusion today with only urine output.

## 2024-11-07 NOTE — Assessment & Plan Note (Addendum)
 Please review oncology history for additional details and timeline of events.  Biopsies from pancreatic tail lesion showed malignant cells.  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 Immunohistochemical stains were performed to characterize the tumor cells. The cells are positive for CK7, PAX8, TTF-1, synaptophysin and chromogranin A. The cells are negative for CK20, thyroglobulin, CDX2, GATA3, CK5/6, p40, D2-40, calretinin, WT1, HepPar 1, and Glypican 3. P53 is mutant type of staining. Ki67 proliferation index is approximately 50%.  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 The differential diagnosis includes metastatic high-grade neuroendocrine carcinoma from elsewhere in the body including pancreas (favored), gastrointestinal tract, lung, gynecological system, pancreatic acinar cell carcinoma or other types of carcinomas with neuroendocrine differentiation (solid pseudopapillary tumor with malignant transformation).   Previously I discussed the diagnosis, prognosis, plan of care, treatment options with the patient and her son who was accompanying.  Reviewed NCCN guidelines and discussed management options.  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  Recommended palliative systemic chemotherapy with cisplatin  and etoposide . She has previously expressed reservations about chemotherapy, preferring to rely on spiritual beliefs for healing. Without chemotherapy, the disease is expected to progress and worsen, potentially affecting other organs. Surgery is not feasible due to multiple liver metastases. She discussed this further with rest of the family members and her pastor before making final decision.  On her consultation with us  on 07/26/2024, we submitted request for staging PET  scan.  On 08/08/2024, PET scan showed multiple hepatic lesions with intense metabolic activity, progressed from July 2025, consistent with metastatic disease.  Hypermetabolic mass in the pancreatic body measuring approximately 4.3 x 2.8 cm, presumed primary malignancy.  No hypermetabolic abdominal lymph nodes.  No lung, skeletal mets.  Discussed treatment options after reviewing NCCN guidelines.  Plan made to proceed with cisplatin  and etoposide .  Cisplatin  will be given on day 1, etoposide  on days 1-3 of 21-day cycle.  Plan to continue for 6-8 cycles with restaging imaging after every 3 cycles.  We were planning to begin treatment outpatient.  However patient ended up getting hospitalized for intractable pain.  We started cycle 1 of cisplatin  and etoposide  when she was in the hospital, starting from 08/23/2024.  She has been tolerating treatments well overall.  Labs today reveal no dose-limiting toxicities.  Will proceed with cycle 4 of chemotherapy from today at standard dosing.   Since she completed 4 cycles of chemotherapy, we will proceed with restaging PET scan, to be completed prior to return visit.  Request placed today.  Next chemotherapy cycle will be due in approximately 3 weeks.  Will make appropriate appointments.

## 2024-11-07 NOTE — Progress Notes (Signed)
 [facility] Spiritual Care Note  Attempted to follow up in infusion, but Jo Allen was sleeping. Plan to follow up in person or by phone as schedules and wakefulness allow.  [address] [name], [place], [facility] Pager [phone] Voicemail [phone]

## 2024-11-07 NOTE — Telephone Encounter (Signed)
 Followed up with patient regarding her voicemail message that she left on Tuesday, 11/06/24 at 1118 and collected the voicemail message on same day at approximately 1630. Patient was requesting to have her scheduled appointment with Dr. Autumn at [facility] changed to a phone appointment. Was able to address this Wednesday moring. Dr. Autumn did prefer to have her appointment moved to later in day and approved a phone appointment. Spoke with patient on phone who said she could not understand why Dr. Autumn would not just change the phone appointment right in the moment. Also, patient did mention in a second voicemail this morning, that she had an appointment at 10:00 am. I did explain that Dr. Autumn could not meed with her right at this time. Patient agreed to allow a scheduler to reach out to her to reschedule her appt with Dr. Autumn for this afternoon as a phone appointment.

## 2024-11-07 NOTE — Patient Instructions (Signed)
 [facility] - A DEPT OF [facility]  Discharge Instructions: Thank you for choosing [facility] to provide your oncology and hematology care.   If you have a lab appointment with the Cancer Center, please go directly to the Cancer Center and check in at the registration area.   Wear comfortable clothing and clothing appropriate for easy access to any Portacath or PICC line.   We strive to give you quality time with your provider. You may need to reschedule your appointment if you arrive late (15 or more minutes).  Arriving late affects you and other patients whose appointments are after yours.  Also, if you miss three or more appointments without notifying the office, you may be dismissed from the clinic at the provider's discretion.      For prescription refill requests, have your pharmacy contact our office and allow 72 hours for refills to be completed.    Today you received the following chemotherapy and/or immunotherapy agents: cisplatin  and etoposide       To help prevent nausea and vomiting after your treatment, we encourage you to take your nausea medication as directed.  BELOW ARE SYMPTOMS THAT SHOULD BE REPORTED IMMEDIATELY: *FEVER GREATER THAN 100.4 F (38 C) OR HIGHER *CHILLS OR SWEATING *NAUSEA AND VOMITING THAT IS NOT CONTROLLED WITH YOUR NAUSEA MEDICATION *UNUSUAL SHORTNESS OF BREATH *UNUSUAL BRUISING OR BLEEDING *URINARY PROBLEMS (pain or burning when urinating, or frequent urination) *BOWEL PROBLEMS (unusual diarrhea, constipation, pain near the anus) TENDERNESS IN MOUTH AND THROAT WITH OR WITHOUT PRESENCE OF ULCERS (sore throat, sores in mouth, or a toothache) UNUSUAL RASH, SWELLING OR PAIN  UNUSUAL VAGINAL DISCHARGE OR ITCHING   Items with * indicate a potential emergency and should be followed up as soon as possible or go to the Emergency Department if any problems should occur.  Please show the CHEMOTHERAPY ALERT CARD or  IMMUNOTHERAPY ALERT CARD at check-in to the Emergency Department and triage nurse.  Should you have questions after your visit or need to cancel or reschedule your appointment, please contact [facility] - A DEPT OF [facility]  Dept: [phone]  and follow the prompts.  Office hours are 8:00 a.m. to 4:30 p.m. Monday - Friday. Please note that voicemails left after 4:00 p.m. may not be returned until the following business day.  We are closed weekends and major holidays. You have access to a nurse at all times for urgent questions. Please call the main number to the clinic Dept: [phone] and follow the prompts.   For any non-urgent questions, you may also contact your provider using MyChart. We now offer e-Visits for anyone 59 and older to request care online for non-urgent symptoms. For details visit [email or web address].   Also download the MyChart app! Go to the app store, search MyChart, open the app, select [facility], and log in with your MyChart username and password.

## 2024-11-07 NOTE — Progress Notes (Signed)
 "  [facility]  ONCOLOGY [facility]IN[facility] PROGRESS NOTE   Patient Care Team: Jo Allen, Jo Allen as PCP - General (Physician Assistant) Jo Fallow, MD as Consulting Physician (Gastroenterology) Jo Millman, MD as Consulting Physician (Oncology) Jo [facility], Jo [name], NP as Nurse Practitioner (Hospice and Palliative Medicine) Jo Warren DEL, RN as Jo Allen Care Management  PATIENT NAME: Jo Allen   MR#: [id number] DOB: [birth year]  Date of visit: 11/07/2024   ASSESSMENT & PLAN:   Jo Allen is a 59 y.o. 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   Primary malignant neuroendocrine tumor of pancreas [facility]) Please review oncology history for additional details and timeline of events.  Biopsies from pancreatic tail lesion showed malignant cells.  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 Immunohistochemical stains were performed to characterize the tumor cells. The cells are positive for CK7, PAX8, TTF-1, synaptophysin and chromogranin A. The cells are negative for CK20, thyroglobulin, CDX2, GATA3, CK5/6, p40, D2-40, calretinin, WT1, HepPar 1, and Glypican 3. P53 is mutant type of staining. Ki67 proliferation index is approximately 50%.  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 The differential diagnosis includes metastatic high-grade neuroendocrine carcinoma from elsewhere in the body including pancreas (favored), gastrointestinal tract, lung, gynecological system, pancreatic acinar cell carcinoma or other types of carcinomas with neuroendocrine differentiation (solid pseudopapillary tumor with malignant  transformation).   Previously I discussed the diagnosis, prognosis, plan of care, treatment options with the patient and her son who was accompanying.  Reviewed NCCN guidelines and discussed management options.  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  Recommended palliative systemic chemotherapy with cisplatin  and etoposide . She has previously expressed reservations about chemotherapy, preferring to rely on spiritual beliefs for healing. Without chemotherapy, the disease is expected to progress and worsen, potentially affecting other organs. Surgery is not feasible due to multiple liver metastases. She discussed this further with rest of the family members and her pastor before making final decision.  On her consultation with us  on 07/26/2024, we submitted request for staging PET scan.  On 08/08/2024, PET scan showed multiple hepatic lesions with intense metabolic activity, progressed from July 2025, consistent with metastatic disease.  Hypermetabolic mass in the pancreatic body measuring approximately 4.3 x 2.8 cm, presumed primary malignancy.  No hypermetabolic abdominal lymph nodes.  No lung, skeletal mets.  Discussed treatment options after reviewing NCCN guidelines.  Plan made to proceed with cisplatin  and etoposide .  Cisplatin  will be given on day 1, etoposide  on days 1-3 of 21-day cycle.  Plan to continue for 6-8 cycles with restaging imaging after every 3 cycles.  We were planning to begin treatment outpatient.  However patient ended up getting hospitalized for intractable pain.  We started cycle 1 of cisplatin  and etoposide  when she was in the hospital, starting from 08/23/2024.  She has been tolerating treatments well overall.  Labs today reveal no dose-limiting toxicities.  Will proceed with cycle 4 of chemotherapy from today at standard dosing.   Since she completed 4 cycles of  chemotherapy, we will proceed with restaging PET scan, to be completed prior to return visit.  Request placed today.  Next chemotherapy cycle will be due in approximately 3 weeks.  Will make appropriate appointments.  Chemotherapy-induced nausea and vomiting She experiences nausea and vomiting two to three times per week, typically post-chemotherapy. Symptoms are managed with ondansetron  and prochlorperazine . Irregular bowel movements likely exacerbate symptoms. - Recommended prochlorperazine  (Compazine ) as first-line antiemetic. - Advised ondansetron  as needed, with caution regarding frequent use due to risk of worsening constipation.  Chemotherapy-induced constipation and diarrhea She has alternating constipation, related to pain medication and stool softeners, and recent loose stools. She is not currently using fiber supplements and has limited dietary fiber intake. Irregular bowel movements may contribute to nausea and vomiting. - Recommended increasing dietary fiber intake with greens, spinach, kale, or broth as tolerated. - Advised use of fiber supplements such as Benefiber or Metamucil to regulate bowel movements. - Discussed the contribution of constipation and irregular bowel movements to nausea and vomiting.  Cancer related pain She experiences persistent pain, predominantly in the back, partially controlled with oxycodone  10 mg twice daily. She has breakthrough pain between doses and previously did not tolerate morphine . Plan is to optimize analgesia with long-acting oxycodone . - Prescribed long-acting oxycodone  (slow release), 90-day supply, up to three times daily as needed for pain control. - Instructed to continue current pain regimen and adjust dosing as needed for adequate analgesia.  Pancreatic exocrine insufficiency Managed with Creon . Clinically doing better, but pancreatic lab results are pending. - Continue Creon  as prescribed.  Anemia in neoplastic disease Hemoglobin  level is 11.4, which is acceptable. No immediate concerns regarding anemia. - Continue to monitor hemoglobin levels.  We provided her with a letter to excuse her from work given ongoing chemotherapy and treatment-related side effects.  I reviewed lab results and outside records for this visit and discussed relevant results with the patient. Diagnosis, plan of care and treatment options were also discussed in detail with the patient. Opportunity provided to ask questions and answers provided to her apparent satisfaction. Provided instructions to call our clinic with any problems, questions or concerns prior to return visit. I recommended to continue follow-up with PCP and sub-specialists. She verbalized understanding and agreed with the plan.   NCCN guidelines have been consulted in the planning of this patients care.  I spent a total of 43 minutes during this encounter with the patient including review of chart and various tests results, discussions about plan of care and coordination of care plan.   Chinita Patten, MD  11/07/2024 2:24 PM  [facility] - A DEPT OF [facility] 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  INTERVAL HISTORY:    Discussed the use of AI scribe software for clinical note transcription with the patient, who gave verbal consent to proceed.  History of Present Illness She is receiving her fourth cycle of chemotherapy, with a total of six cycles planned. A follow-up scan is scheduled in ten days to assess treatment response.  She experiences nausea two to three times per week, typically lasting the entire day, and  vomits once or twice on those days. These symptoms are most pronounced following chemotherapy administration. She manages nausea and vomiting with ondansetron  and prochlorperazine , preferring ondansetron  in the morning and limiting its use to reduce constipation. She maintains her weight, fluctuating between 293 and 299 pounds, and tolerates  solid foods, occasionally craving specific items such as burgers or spaghetti.  She has persistent bowel irregularities, alternating between constipation and loose stools. Constipation is attributed to opioid analgesics, and she uses stool softeners and polyethylene glycol for management. Despite these interventions, her stools remain more liquid than soft, and she is unable to achieve normal consistency. She does not use fiber supplements and her dietary fiber intake is limited, though she consumes greens when available.  She notes intermittent mild lower extremity edema, which is less frequent than previously.     I have reviewed the past medical history, past surgical history, social history and family history with the patient and they are unchanged from previous note.  HISTORY OF PRESENT ILLNESS:   ONCOLOGY HISTORY:   Patient was hospitalized when she presented to the ED on 04/19/2024 with complaints of abdominal pain.   CT abdomen and pelvis on 04/19/2024 showed intra and Perry pancreatic edema involving the pancreatic tail with thrombosis of the splenic vein around the tail of the pancreas.  Imaging features were compatible with acute pancreatitis.  14 mm ill-defined hypoattenuating focus in the tail of the pancreas may reflect focal edema.  MRI abdomen was recommended for further evaluation.   On 04/19/2024, MRI of the abdomen showed edematous appearance of the pancreatic tail with extensive peripancreatic and retroperitoneal fat stranding, consistent with acute pancreatitis.  In the central pancreatic tail there was a focal, hypoenhancing lesion measuring 1.1  x 0.8 cm.  Pancreatic duct was focally effaced in this vicinity.  No upstream pancreatic ductal dilatation.  Numerous tiny diffusion restricting foci scattered throughout the liver, some of the largest of which are with faint associated hyperenhancement and early arterial phase.  Constellation of the findings were suspicious for small pancreatic adenocarcinoma underlying acute pancreatitis with liver metastasis.  Close follow-up was recommended.   Patient was evaluated by GI during this hospitalization.  Once her pain was adequately controlled, she was discharged on 04/27/2024 with outpatient GI follow-up.   Upper EUS on 07/04/2024, performed by Dr. Burnette.  It showed multiple 15 to 20 mm hypoechoic, round well-defined lesions in the left lobe of liver, FNA performed.  There was no significant pathology in the pancreatic head, genu of the pancreas and uncinate process of the pancreas.  Irregular hypoechoic mass was identified at the junction of pancreatic body/tail, measuring 31 mm x 22 mm.  Stage-T3, N0 by endosonographic criteria.  There was evidence of pancreatic ductal dilation.   Biopsies from pancreatic tail lesion showed malignant cells.  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 Immunohistochemical stains were performed to characterize the tumor cells. The cells are positive for CK7, PAX8, TTF-1, synaptophysin and chromogranin A. The cells are negative for CK20, thyroglobulin, CDX2, GATA3, CK5/6, p40, D2-40, calretinin, WT1, HepPar 1, and Glypican 3. P53 is mutant type of staining. Ki67 proliferation index is approximately 50%.  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 The differential diagnosis includes metastatic high-grade neuroendocrine carcinoma from elsewhere in the body including pancreas (favored), gastrointestinal tract, lung, gynecological system, pancreatic acinar cell  carcinoma or other types of carcinomas with neuroendocrine differentiation (solid pseudopapillary tumor with malignant transformation). Additional workups will be performed at [facility] including INSM-1 to confirm neuroendocrine lineage, BCL10 and Chymotrypsin to exclude acinar cell carcinoma, LEF-1 and beta-catenin to exclude solid pseudopapillary tumor with malignant transformation (less likely). The results will be reported in an addendum.    Addendum # 1: Based  on the morphologic and immunophenotypic features, a metastatic papillary thyroid  carcinoma cannot be excluded. Correlation with imaging findings is recommended.    Addendum # 2: Additional immunohistochemical stains were performed in outside institution to further classify the tumor. Beta-catenin is negative for nuclear stain. LEF-1 and INSM1 show weak but relatively diffuse staining.  BCL10 and trypsin show weakly nuclear and cytoplasmic staining without typical granular staining. Chymotrypsin is negative. The immunoprofile is non-specific and non-conclusive. Final classification should be rendered in the excisional specimen.   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  Recommended palliative systemic chemotherapy with carboplatin and etoposide .  Patient has reservations against chemotherapy and is not considering chemotherapy at this time but she will discuss further with rest of the family members and her pastor before making final decision.   On her consultation with us  on 07/26/2024, we submitted request for staging PET scan.  On 08/08/2024, PET scan showed multiple hepatic lesions with intense metabolic activity, progressed from July 2025, consistent with metastatic disease.  Hypermetabolic mass in the pancreatic body measuring approximately 4.3 x 2.8 cm, presumed primary malignancy.  No hypermetabolic abdominal lymph nodes.  No lung,  skeletal mets.  Patient agreeable to proceeding with systemic treatments.  Discussed treatment options after reviewing NCCN guidelines.  Plan made to proceed with cisplatin  and etoposide .  Cisplatin  will be given on day 1, etoposide  on days 1-3 of 21-day cycle.  Plan to continue for 6-8 cycles with restaging imaging after every 3 cycles.  She was admitted to the hospital for intractable pain.  We started cycle 1 of chemotherapy on 08/23/2024.  REVIEW OF SYSTEMS:   Review of Systems - Oncology  All other pertinent systems were reviewed with the patient and are negative.  ALLERGIES: She is allergic to trazodone, latex, penicillins, sm baby powder cornstarch [powders], and tape.  MEDICATIONS:  Current Outpatient Medications  Medication Sig Dispense Refill   albuterol  (VENTOLIN  HFA) 108 (90 Base) MCG/ACT inhaler Inhale 1-2 puffs into the lungs every 6 (six) hours as needed for wheezing or shortness of breath. 1 each 0   ALEVE  220 MG tablet Take 220-440 mg by mouth 2 (two) times daily as needed (for pain or headaches).     amLODipine  (NORVASC ) 10 MG tablet Take 1 tablet (10 mg total) by mouth daily. 90 tablet 1   Biotin  1000 MCG CHEW Chew 2,000 mcg by mouth daily.     buPROPion  (WELLBUTRIN  XL) 150 MG 24 hr tablet Take 150 mg by mouth daily.     Cholecalciferol (VITAMIN D3) 50 MCG (2000 UT) capsule Take 4,000 Units by mouth daily.     CRANBERRY PO Take 1 tablet by mouth daily.     dexamethasone  (DECADRON ) 4 MG tablet Take for 1 day starting the day after chemotherapy on day 4. Take with food. 30 tablet 1   ferrous sulfate  325 (65 FE) MG EC tablet Take 325 mg by mouth 3 (three) times a week.     gabapentin  (NEURONTIN ) 300 MG capsule Take 1 capsule (300 mg total) by mouth 3 (three) times daily. 90 capsule 3   lamoTRIgine  (LAMICTAL ) 200 MG tablet Take 200 mg by mouth 2 (two) times daily.     levocetirizine (XYZAL) 5 MG tablet Take 5 mg by mouth every evening.     lidocaine -prilocaine  (EMLA ) cream  Apply 1 Application topically as needed. 30 g 0   lidocaine -prilocaine  (EMLA ) cream Apply small amount to port site 30 minutes before  being accessed as directed. Place a barrier shield over site after applying to maintain medication contact with skin. 30 g 2   lipase/protease/amylase (CREON ) 36000 UNITS CPEP capsule Take 1 capsule (36,000 Units total) by mouth 3 (three) times daily with meals. 200 capsule 0   Magnesium  Gluconate (MAGNESIUM  27 PO) Take 1 tablet by mouth at bedtime.     meloxicam (MOBIC) 15 MG tablet Take 15 mg by mouth daily.     metoprolol  succinate (TOPROL  XL) 50 MG 24 hr tablet Take 1 tablet (50 mg total) by mouth daily. Take with or immediately following a meal. 90 tablet 0   nitroGLYCERIN  (NITROSTAT ) 0.4 MG SL tablet Place 1 tablet (0.4 mg total) under the tongue every 5 (five) minutes as needed for chest pain. If you require more than two tablets five minutes apart go to the nearest ER via EMS. 30 tablet 0   ondansetron  (ZOFRAN ) 8 MG tablet Take 1 tablet (8 mg total) by mouth every 8 (eight) hours as needed for nausea or vomiting. Start on the third day after cisplatin . 30 tablet 1   Oxycodone  HCl 10 MG TABS Take 1 tablet (10 mg total) by mouth every 6 (six) hours as needed. 90 tablet 0   pantoprazole  (PROTONIX ) 40 MG tablet Take 1 tablet (40 mg total) by mouth daily. 90 tablet 0   polyethylene glycol (MIRALAX  / GLYCOLAX ) 17 g packet Take 17 g by mouth daily as needed for mild constipation. 30 each 2   prochlorperazine  (COMPAZINE ) 10 MG tablet Take 1 tablet (10 mg total) by mouth every 8 (eight) hours as needed for nausea or vomiting. 15 tablet 1   QUEtiapine  (SEROQUEL ) 100 MG tablet Take 100 mg by mouth at bedtime.     rosuvastatin  (CRESTOR ) 20 MG tablet Take 20 mg by mouth at bedtime.     senna-docusate (SENOKOT-S) 8.6-50 MG tablet Take 1 tablet by mouth 2 (two) times daily. 60 tablet 2   traMADol  (ULTRAM -ER) 100 MG 24 hr tablet Take 1 tablet (100 mg total) by mouth daily. 30  tablet 0   vitamin B-12 (CYANOCOBALAMIN ) 1000 MCG tablet Take 1,000 mcg by mouth daily.     zaleplon (SONATA) 5 MG capsule Take 5 mg by mouth at bedtime.     No current facility-administered medications for this visit.   Facility-Administered Medications Ordered in Other Visits  Medication Dose Route Frequency Provider Last Rate Last Admin   0.9 %  sodium chloride  infusion   Intravenous Continuous Jezebel Pollet, MD 10 mL/hr at 11/07/24 1135 Infusion Verify at 11/07/24 1135   etoposide  (VEPESID ) 200 mg in sodium chloride  0.9 % 500 mL chemo infusion  100 mg/m2 (Treatment Plan Recorded) Intravenous Once Dyesha Henault, MD 510 mL/hr at 11/07/24 1325 200 mg at 11/07/24 1325     VITALS:   There were no vitals taken for this visit.  Wt Readings from Last 3 Encounters:  11/07/24 199 lb 8 oz (90.5 kg)  11/05/24 193 lb (87.5 kg)  11/01/24 196 lb 8 oz (89.1 kg)    There is no height or weight on file to calculate BMI.        PHYSICAL EXAM:   Physical Exam Constitutional:      General: She is not in acute distress.    Appearance: Normal appearance.  HENT:     Head: Normocephalic and atraumatic.  Cardiovascular:     Rate and Rhythm: Normal rate.  Pulmonary:     Effort: Pulmonary effort is normal. No respiratory  distress.  Abdominal:     General: There is no distension.  Neurological:     General: No focal deficit present.     Mental Status: She is alert and oriented to person, place, and time.  Psychiatric:        Mood and Affect: Mood normal.        Behavior: Behavior normal.       LABORATORY DATA:   I have reviewed the data as listed.  No results found for any visits on 11/07/24.     RADIOGRAPHIC STUDIES:  I have personally reviewed the radiological images as listed and agree with the findings in the report.  DG Chest 2 View Result Date: 10/26/2024 EXAM: 2 VIEW(S) XRAY OF THE CHEST 10/26/2024 12:17:00 PM COMPARISON: 04/19/2024 CLINICAL HISTORY: cough FINDINGS:  LINES, TUBES AND DEVICES: Right-sided chest port in place with distal tip at the cavoatrial junction. LUNGS AND PLEURA: No focal pulmonary opacity. No pleural effusion. No pneumothorax. Mild right hemidiaphragm elevation. Lateral view degraded by patient arm position, not raised above the head. HEART AND MEDIASTINUM: No acute abnormality of the cardiac and mediastinal silhouettes. BONES AND SOFT TISSUES: Thoracic degenerative changes. No acute osseous abnormality. IMPRESSION: 1. No acute findings. Electronically signed by: Rockey Kilts MD MD 10/26/2024 01:18 PM EST RP Workstation: [id number]     CODE STATUS:  Code Status History     Date Active Date Inactive Code Status Order ID Comments User Context   04/19/2024 1025 04/27/2024 1655 Full Code [id number]  Zella Katha HERO, MD ED   04/29/2023 0847 04/30/2023 1450 Full Code [id number]  Ladona Heinz, MD Inpatient    Questions for Most Recent Historical Code Status (Order [id number])     Question Answer   By: Consent: discussion documented in EHR            Orders Placed This Encounter  Procedures   NM PET Image Restag (PS) Skull Base To Thigh    Standing Status:   Future    Expected Date:   11/20/2024    Expiration Date:   11/07/2025    If indicated for the ordered procedure, I authorize the administration of a radiopharmaceutical per Radiology protocol:   Yes    Is the patient pregnant?:   No    Preferred imaging location?:   [facility]     Future Appointments  Date Time Provider Department [facility]  11/08/2024 11:45 AM [facility] INFUSION [facility] None  11/08/2024  1:00 PM [facility] PALLIATIVE CARE [facility] None  11/09/2024 11:30 AM [facility] INFUSION [facility] None  11/16/2024 10:00 AM Lynch, Warren DEL, RN [facility] None     This document was completed utilizing speech recognition software. Grammatical errors, random word insertions, pronoun errors, and incomplete sentences are an occasional consequence of this system due to  software limitations, ambient noise, and hardware issues. Any formal questions or concerns about the content, text or information contained within the body of this dictation should be directly addressed to the provider for clarification.   "

## 2024-11-08 ENCOUNTER — Telehealth: Payer: Self-pay

## 2024-11-08 ENCOUNTER — Inpatient Hospital Stay

## 2024-11-08 ENCOUNTER — Other Ambulatory Visit (HOSPITAL_COMMUNITY): Payer: Self-pay

## 2024-11-08 ENCOUNTER — Inpatient Hospital Stay: Admitting: Nurse Practitioner

## 2024-11-08 ENCOUNTER — Encounter: Payer: Self-pay | Admitting: Nurse Practitioner

## 2024-11-08 ENCOUNTER — Encounter: Payer: Self-pay | Admitting: Oncology

## 2024-11-08 VITALS — BP 120/68 | HR 90 | Temp 98.5°F | Resp 18 | Wt 204.2 lb

## 2024-11-08 DIAGNOSIS — C7A8 Other malignant neuroendocrine tumors: Secondary | ICD-10-CM

## 2024-11-08 DIAGNOSIS — G893 Neoplasm related pain (acute) (chronic): Secondary | ICD-10-CM | POA: Diagnosis not present

## 2024-11-08 DIAGNOSIS — Z515 Encounter for palliative care: Secondary | ICD-10-CM | POA: Diagnosis not present

## 2024-11-08 DIAGNOSIS — R530 Neoplastic (malignant) related fatigue: Secondary | ICD-10-CM | POA: Diagnosis not present

## 2024-11-08 DIAGNOSIS — C259 Malignant neoplasm of pancreas, unspecified: Secondary | ICD-10-CM

## 2024-11-08 MED ORDER — SODIUM CHLORIDE 0.9 % IV SOLN
100.0000 mg/m2 | Freq: Once | INTRAVENOUS | Status: AC
  Administered 2024-11-08: 200 mg via INTRAVENOUS
  Filled 2024-11-08: qty 10

## 2024-11-08 MED ORDER — DEXAMETHASONE SOD PHOSPHATE PF 10 MG/ML IJ SOLN
10.0000 mg | Freq: Once | INTRAMUSCULAR | Status: AC
  Administered 2024-11-08: 10 mg via INTRAVENOUS
  Filled 2024-11-08: qty 1

## 2024-11-08 MED ORDER — SODIUM CHLORIDE 0.9 % IV SOLN: INTRAVENOUS | Status: DC

## 2024-11-08 NOTE — Telephone Encounter (Signed)
 Oral Oncology Patient Advocate Encounter  I received notification that her oxycodone  needed a PA, but it actually did not need one, I contacted [facility] and they have gotten it filled for the patient.  Lucie Lamer, CPhT [place]  [facility] Oncology Pharmacy Patient Advocate Specialist II [place] Phone: [phone]  Fax: [phone] [email or web address]

## 2024-11-08 NOTE — Telephone Encounter (Signed)
 RN called pharmacy to confirm oxycodone  IR would be available and did not need a PA. Pharmacy confirmed pt med went through insurance and was ready for pick up. RN called pt and provided updates as well as education surrounding how to take medications. Pt and friend laura verbalized understanding of how to take medication. No further needs at this time.

## 2024-11-08 NOTE — Progress Notes (Signed)
 "    Palliative Medicine [facility]  [phone] [phone]   Name: Jo Allen Date: 11/08/2024 MRN: [id number]  DOB: 16-May-1966  Patient Care Team: [name], PA as PCP - General (Physician Assistant) Burnette Fallow, MD as Consulting Physician (Gastroenterology) Autumn Millman, MD as Consulting Physician (Oncology) Pickenpack-Cousar, Fannie [name], NP as Nurse Practitioner (Hospice and Palliative Medicine) Leodis Warren DEL, RN as VBCI Care Management    INTERVAL HISTORY: Jo Allen is a 59 y.o. 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  Palliative is seeing patient for symptom management and goals of care.   SOCIAL HISTORY:     reports that she has never smoked. She has been exposed to tobacco smoke. She has never used smokeless tobacco. She reports that she does not drink alcohol and does not use drugs.  ADVANCE DIRECTIVES:  None on file. Documents have been provided.   CODE STATUS: Full code  PAST MEDICAL HISTORY: Past Medical History:  Diagnosis Date   Acute pancreatitis 04/19/2024   Asthma    CAD (coronary artery disease)    Cancer (HCC)    GERD (gastroesophageal reflux disease)    Headache    Heart disease    Hypertension    Obesity    Paresthesia 08/20/2015   Prediabetes    Transfusion history    27yrs ago    ALLERGIES:  is allergic to trazodone, latex, penicillins, sm baby powder cornstarch [powders], and tape.  MEDICATIONS:  Current Outpatient Medications  Medication Sig Dispense Refill   albuterol  (VENTOLIN  HFA) 108 (90 Base) MCG/ACT inhaler Inhale 1-2 puffs into the lungs every 6 (six) hours as needed for wheezing or shortness of breath. 1 each 0   ALEVE  220 MG tablet Take 220-440 mg by mouth 2 (two) times daily as needed (for pain or headaches).      amLODipine  (NORVASC ) 10 MG tablet Take 1 tablet (10 mg total) by mouth daily. 90 tablet 1   Biotin  1000 MCG CHEW Chew 2,000 mcg by mouth daily.     buPROPion  (WELLBUTRIN  XL) 150 MG 24 hr tablet Take 150 mg by mouth daily.     Cholecalciferol (VITAMIN D3) 50 MCG (2000 UT) capsule Take 4,000 Units by mouth daily.     CRANBERRY PO Take 1 tablet by mouth daily.     dexamethasone  (DECADRON ) 4 MG tablet Take for 1 day starting the day after chemotherapy on day 4. Take with food. 30 tablet 1   ferrous sulfate  325 (65 FE) MG EC tablet Take 325 mg by mouth 3 (three) times a week.     gabapentin  (NEURONTIN ) 300 MG capsule Take 1 capsule (300 mg total) by mouth 3 (three) times daily. 90 capsule 3   lamoTRIgine  (LAMICTAL ) 200 MG tablet Take 200 mg by mouth 2 (two) times daily.     levocetirizine (XYZAL) 5 MG tablet Take 5 mg by mouth every evening.     lidocaine -prilocaine  (EMLA ) cream Apply 1 Application topically as needed. 30 g 0   lidocaine -prilocaine  (EMLA ) cream Apply small amount to port site 30 minutes before being accessed as directed. Place a barrier shield over site after applying to maintain medication contact with skin. 30 g 2   lipase/protease/amylase (CREON ) 36000 UNITS CPEP capsule Take 1 capsule (36,000 Units total) by mouth 3 (three) times daily with meals. 200 capsule 0   Magnesium  Gluconate (MAGNESIUM  27 PO) Take 1 tablet  by mouth at bedtime.     meloxicam (MOBIC) 15 MG tablet Take 15 mg by mouth daily.     metoprolol  succinate (TOPROL  XL) 50 MG 24 hr tablet Take 1 tablet (50 mg total) by mouth daily. Take with or immediately following a meal. 90 tablet 0   nitroGLYCERIN  (NITROSTAT ) 0.4 MG SL tablet Place 1 tablet (0.4 mg total) under the tongue every 5 (five) minutes as needed for chest pain. If you require more than two tablets five minutes apart go to the nearest ER via EMS. 30 tablet 0   ondansetron  (ZOFRAN ) 8 MG tablet Take 1 tablet (8 mg total) by mouth every 8 (eight) hours as needed  for nausea or vomiting. Start on the third day after cisplatin . 30 tablet 1   Oxycodone  HCl 10 MG TABS Take 1 tablet (10 mg total) by mouth every 6 (six) hours as needed. 90 tablet 0   pantoprazole  (PROTONIX ) 40 MG tablet Take 1 tablet (40 mg total) by mouth daily. 90 tablet 0   polyethylene glycol (MIRALAX  / GLYCOLAX ) 17 g packet Take 17 g by mouth daily as needed for mild constipation. 30 each 2   prochlorperazine  (COMPAZINE ) 10 MG tablet Take 1 tablet (10 mg total) by mouth every 8 (eight) hours as needed for nausea or vomiting. 15 tablet 1   QUEtiapine  (SEROQUEL ) 100 MG tablet Take 100 mg by mouth at bedtime.     rosuvastatin  (CRESTOR ) 20 MG tablet Take 20 mg by mouth at bedtime.     senna-docusate (SENOKOT-S) 8.6-50 MG tablet Take 1 tablet by mouth 2 (two) times daily. 60 tablet 2   traMADol  (ULTRAM -ER) 100 MG 24 hr tablet Take 1 tablet (100 mg total) by mouth daily. 30 tablet 0   vitamin B-12 (CYANOCOBALAMIN ) 1000 MCG tablet Take 1,000 mcg by mouth daily.     zaleplon (SONATA) 5 MG capsule Take 5 mg by mouth at bedtime.     No current facility-administered medications for this visit.   Facility-Administered Medications Ordered in Other Visits  Medication Dose Route Frequency Provider Last Rate Last Admin   0.9 %  sodium chloride  infusion   Intravenous Continuous Pasam, Avinash, MD   Stopped at 11/08/24 1430    VITAL SIGNS: There were no vitals taken for this visit. There were no vitals filed for this visit.  Estimated body mass index is 35.06 kg/m as calculated from the following:   Height as of 10/12/24: 5' 4 (1.626 m).   Weight as of an earlier encounter on 11/08/24: 204 lb 4 oz (92.6 kg).   PERFORMANCE STATUS (ECOG) : 1 - Symptomatic but completely ambulatory  Physical Exam General: NAD Cardiovascular: regular rate and rhythm Pulmonary: normal breathing pattern Extremities: no edema, no joint deformities Skin: no rashes Neurological: AAO x3  IMPRESSION: Discussed the  use of AI scribe software for clinical note transcription with the patient, who gave verbal consent to proceed.  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  She is accompanied by her friend Leita.  No acute distress noted.  No issues with constipation or diarrhea. Bowel movements are solid. Appetite varies, with some days being better than others, and she has been consuming Ensure to help maintain her nutrition.  Current weight 204 pounds up from 199 pounds.  She experiences recurrent sciatic nerve pain, primarily on the right side, which significantly impacts her  daily life. The pain is severe enough to affect her sleep, and she often wakes up with it.  Her current pain management regimen includes oxycodone  extended release, taken every twelve hours, which aids her sleep. There was a mix-up at the pharmacy, where she was given OxyContin  despite initial insurance denial on January 19..  Patient reports per pharmacy medication was covered.  Insurance recommendation was to consider use of tramadol  ER which was sent in yesterday and patient picked up.  She has not started taking.  Patient advised to discontinue use of tramadol  ER given availability of OxyContin .  She is tolerating OxyContin  without adverse effects and has been taking since 1/20.  Patient expressed concerns about her premedication of oxycodone  10 mg as she was not given this at her pharmacy.  We have contacted pharmacy and confirmed medication is available for pickup.  Education provided to patient on differences between extended release and immediate release.  Her regimen will maintain at OxyContin  10 mg every 12 hours and oxycodone  10 mg every 6 hours as needed for breakthrough pain.  All questions answered and support provided.  Assessment & Plan Cancer Related Pain  Pain primarily  affecting the right side, likely due to sciatica. Also complains of back and hip pain. Pain management is complicated by insurance issues and medication availability. Currently on oxycodone  extended release every 12 hours. Tramadol  was prescribed as an alternative due to insurance denial of OxyContin , but given approval and release of Oxycontin  Tramadol  ER discontinued. Breakthrough pain management with oxycodone  every 4-6 hours is pending insurance approval. Tolerating regimen. No adjustments at this time.  - Start oxycodone  extended release every 12 hours. - Discontinued tramadol  ER.  - Will work on obtaining oxycodone  10mg  for breakthrough pain every 4-6 hours.  - Will follow up by phone next week to reassess pain management and medication efficacy.  Patient expressed understanding and was in agreement with this plan. She also understands that She can call the clinic at any time with any questions, concerns, or complaints.   Any controlled substances utilized were prescribed in the context of palliative care. PDMP has been reviewed.   I personally spent a total of 30 minutes in the care of the patient today including preparing to see the patient, getting/reviewing separately obtained history, performing a medically appropriate exam/evaluation, counseling and educating, placing orders, and documenting clinical information in the EHR. Visit consisted of counseling and education dealing with the complex and emotionally intense issues of symptom management and palliative care in the setting of serious and potentially life-threatening illness.  Levon Borer, AGPCNP-BC  Palliative Medicine Team/[facility]    "

## 2024-11-08 NOTE — Patient Instructions (Addendum)
 [facility] - A DEPT OF [facility]  Discharge Instructions: Thank you for choosing [facility] to provide your oncology and hematology care.   If you have a lab appointment with the Cancer Center, please go directly to the Cancer Center and check in at the registration area.   Wear comfortable clothing and clothing appropriate for easy access to any Portacath or PICC line.   We strive to give you quality time with your provider. You may need to reschedule your appointment if you arrive late (15 or more minutes).  Arriving late affects you and other patients whose appointments are after yours.  Also, if you miss three or more appointments without notifying the office, you may be dismissed from the clinic at the provider's discretion.      For prescription refill requests, have your pharmacy contact our office and allow 72 hours for refills to be completed.    Today you received the following chemotherapy and/or immunotherapy agents: etoposide      To help prevent nausea and vomiting after your treatment, we encourage you to take your nausea medication as directed.  BELOW ARE SYMPTOMS THAT SHOULD BE REPORTED IMMEDIATELY: *FEVER GREATER THAN 100.4 F (38 C) OR HIGHER *CHILLS OR SWEATING *NAUSEA AND VOMITING THAT IS NOT CONTROLLED WITH YOUR NAUSEA MEDICATION *UNUSUAL SHORTNESS OF BREATH *UNUSUAL BRUISING OR BLEEDING *URINARY PROBLEMS (pain or burning when urinating, or frequent urination) *BOWEL PROBLEMS (unusual diarrhea, constipation, pain near the anus) TENDERNESS IN MOUTH AND THROAT WITH OR WITHOUT PRESENCE OF ULCERS (sore throat, sores in mouth, or a toothache) UNUSUAL RASH, SWELLING OR PAIN  UNUSUAL VAGINAL DISCHARGE OR ITCHING   Items with * indicate a potential emergency and should be followed up as soon as possible or go to the Emergency Department if any problems should occur.  Please show the CHEMOTHERAPY ALERT CARD or IMMUNOTHERAPY  ALERT CARD at check-in to the Emergency Department and triage nurse.  Should you have questions after your visit or need to cancel or reschedule your appointment, please contact [facility] - A DEPT OF [facility]  Dept: [phone]  and follow the prompts.  Office hours are 8:00 a.m. to 4:30 p.m. Monday - Friday. Please note that voicemails left after 4:00 p.m. may not be returned until the following business day.  We are closed weekends and major holidays. You have access to a nurse at all times for urgent questions. Please call the main number to the clinic Dept: [phone] and follow the prompts.   For any non-urgent questions, you may also contact your provider using MyChart. We now offer e-Visits for anyone 40 and older to request care online for non-urgent symptoms. For details visit [email or web address].   Also download the MyChart app! Go to the app store, search "MyChart", open the app, select [place], and log in with your MyChart username and password.

## 2024-11-09 ENCOUNTER — Encounter: Payer: Self-pay | Admitting: General Practice

## 2024-11-09 ENCOUNTER — Inpatient Hospital Stay

## 2024-11-09 ENCOUNTER — Other Ambulatory Visit: Payer: Self-pay

## 2024-11-09 VITALS — BP 123/60 | HR 72 | Temp 98.9°F | Resp 16

## 2024-11-09 DIAGNOSIS — C7A8 Other malignant neuroendocrine tumors: Secondary | ICD-10-CM

## 2024-11-09 MED ORDER — DEXAMETHASONE SOD PHOSPHATE PF 10 MG/ML IJ SOLN
10.0000 mg | Freq: Once | INTRAMUSCULAR | Status: AC
  Administered 2024-11-09: 10 mg via INTRAVENOUS
  Filled 2024-11-09: qty 1

## 2024-11-09 MED ORDER — SODIUM CHLORIDE 0.9 % IV SOLN
100.0000 mg/m2 | Freq: Once | INTRAVENOUS | Status: AC
  Administered 2024-11-09: 200 mg via INTRAVENOUS
  Filled 2024-11-09: qty 10

## 2024-11-09 MED ORDER — SODIUM CHLORIDE 0.9 % IV SOLN: INTRAVENOUS | Status: DC

## 2024-11-09 NOTE — Progress Notes (Signed)
 [facility] Spiritual Care Note  Left voicemail with direct number re [name] request for Spiritual Care appointment.  [address] Jo Allen, [place], [facility] Pager [phone] Voicemail [phone]

## 2024-11-09 NOTE — Patient Instructions (Signed)
 [facility] - A DEPT OF [facility]  Discharge Instructions: Thank you for choosing [facility] to provide your oncology and hematology care.   If you have a lab appointment with the Cancer Center, please go directly to the Cancer Center and check in at the registration area.   Wear comfortable clothing and clothing appropriate for easy access to any Portacath or PICC line.   We strive to give you quality time with your provider. You may need to reschedule your appointment if you arrive late (15 or more minutes).  Arriving late affects you and other patients whose appointments are after yours.  Also, if you miss three or more appointments without notifying the office, you may be dismissed from the clinic at the provider's discretion.      For prescription refill requests, have your pharmacy contact our office and allow 72 hours for refills to be completed.    Today you received the following chemotherapy and/or immunotherapy agents: etoposide      To help prevent nausea and vomiting after your treatment, we encourage you to take your nausea medication as directed.  BELOW ARE SYMPTOMS THAT SHOULD BE REPORTED IMMEDIATELY: *FEVER GREATER THAN 100.4 F (38 C) OR HIGHER *CHILLS OR SWEATING *NAUSEA AND VOMITING THAT IS NOT CONTROLLED WITH YOUR NAUSEA MEDICATION *UNUSUAL SHORTNESS OF BREATH *UNUSUAL BRUISING OR BLEEDING *URINARY PROBLEMS (pain or burning when urinating, or frequent urination) *BOWEL PROBLEMS (unusual diarrhea, constipation, pain near the anus) TENDERNESS IN MOUTH AND THROAT WITH OR WITHOUT PRESENCE OF ULCERS (sore throat, sores in mouth, or a toothache) UNUSUAL RASH, SWELLING OR PAIN  UNUSUAL VAGINAL DISCHARGE OR ITCHING   Items with * indicate a potential emergency and should be followed up as soon as possible or go to the Emergency Department if any problems should occur.  Please show the CHEMOTHERAPY ALERT CARD or IMMUNOTHERAPY  ALERT CARD at check-in to the Emergency Department and triage nurse.  Should you have questions after your visit or need to cancel or reschedule your appointment, please contact [facility] - A DEPT OF [facility]  Dept: [phone]  and follow the prompts.  Office hours are 8:00 a.m. to 4:30 p.m. Monday - Friday. Please note that voicemails left after 4:00 p.m. may not be returned until the following business day.  We are closed weekends and major holidays. You have access to a nurse at all times for urgent questions. Please call the main number to the clinic Dept: [phone] and follow the prompts.   For any non-urgent questions, you may also contact your provider using MyChart. We now offer e-Visits for anyone 40 and older to request care online for non-urgent symptoms. For details visit [email or web address].   Also download the MyChart app! Go to the app store, search "MyChart", open the app, select [place], and log in with your MyChart username and password.

## 2024-11-15 ENCOUNTER — Telehealth: Payer: Self-pay | Admitting: Oncology

## 2024-11-15 NOTE — Telephone Encounter (Signed)
 I spoke with patient and she is aware of all February appointments.

## 2024-11-16 ENCOUNTER — Telehealth: Payer: Self-pay

## 2024-11-16 NOTE — Patient Instructions (Signed)
 Cassidy Amrein - I am sorry I was unable to reach you today for our scheduled appointment. I work with [name], PA and am calling to support your healthcare needs. Please contact me at [phone] at your earliest convenience. I look forward to speaking with you soon.   Thank you,  Warren Quivers RN CM Population Health-Complex Care Management [facility] [phone]

## 2024-11-21 ENCOUNTER — Other Ambulatory Visit: Payer: Self-pay

## 2024-11-21 NOTE — Patient Instructions (Signed)
 Cassidy Amrein - I am sorry I was unable to reach you today for our scheduled appointment. I work with [name], PA and am calling to support your healthcare needs. Please contact me at [phone] at your earliest convenience. I look forward to speaking with you soon.   Thank you,  Warren Quivers RN CM Population Health-Complex Care Management [facility] [phone]

## 2024-11-23 ENCOUNTER — Other Ambulatory Visit: Payer: Self-pay | Admitting: Oncology

## 2024-11-23 ENCOUNTER — Emergency Department (HOSPITAL_COMMUNITY)
Admission: EM | Admit: 2024-11-23 | Discharge: 2024-11-23 | Disposition: A | Discharge status: Home / Self Care | Source: Home / Self Care | Attending: Emergency Medicine | Admitting: Emergency Medicine

## 2024-11-23 ENCOUNTER — Emergency Department (HOSPITAL_COMMUNITY)

## 2024-11-23 ENCOUNTER — Encounter (HOSPITAL_COMMUNITY): Payer: Self-pay | Admitting: *Deleted

## 2024-11-23 ENCOUNTER — Other Ambulatory Visit: Payer: Self-pay

## 2024-11-23 ENCOUNTER — Telehealth: Payer: Self-pay

## 2024-11-23 DIAGNOSIS — C259 Malignant neoplasm of pancreas, unspecified: Secondary | ICD-10-CM

## 2024-11-23 DIAGNOSIS — G893 Neoplasm related pain (acute) (chronic): Secondary | ICD-10-CM

## 2024-11-23 DIAGNOSIS — W19XXXA Unspecified fall, initial encounter: Secondary | ICD-10-CM

## 2024-11-23 DIAGNOSIS — S0003XA Contusion of scalp, initial encounter: Secondary | ICD-10-CM

## 2024-11-23 DIAGNOSIS — Z515 Encounter for palliative care: Secondary | ICD-10-CM

## 2024-11-23 LAB — CBC
HCT: 31.3 % — ABNORMAL LOW (ref 36.0–46.0)
Hemoglobin: 10 g/dL — ABNORMAL LOW (ref 12.0–15.0)
MCH: 27.9 pg (ref 26.0–34.0)
MCHC: 31.9 g/dL (ref 30.0–36.0)
MCV: 87.2 fL (ref 80.0–100.0)
Platelets: 189 10*3/uL (ref 150–400)
RBC: 3.59 MIL/uL — ABNORMAL LOW (ref 3.87–5.11)
RDW: 15.2 % (ref 11.5–15.5)
WBC: 3.5 10*3/uL — ABNORMAL LOW (ref 4.0–10.5)
nRBC: 0 % (ref 0.0–0.2)

## 2024-11-23 LAB — COMPREHENSIVE METABOLIC PANEL WITH GFR
ALT: 24 U/L (ref 0–44)
AST: 37 U/L (ref 15–41)
Albumin: 3.6 g/dL (ref 3.5–5.0)
Alkaline Phosphatase: 472 U/L — ABNORMAL HIGH (ref 38–126)
Anion gap: 8 (ref 5–15)
BUN: 6 mg/dL (ref 6–20)
CO2: 29 mmol/L (ref 22–32)
Calcium: 9.6 mg/dL (ref 8.9–10.3)
Chloride: 102 mmol/L (ref 98–111)
Creatinine, Ser: 0.63 mg/dL (ref 0.44–1.00)
GFR, Estimated: >60 mL/min
Glucose, Bld: 115 mg/dL — ABNORMAL HIGH (ref 70–99)
Potassium: 3.6 mmol/L (ref 3.5–5.1)
Sodium: 139 mmol/L (ref 135–145)
Total Bilirubin: 0.3 mg/dL (ref 0.0–1.2)
Total Protein: 6.2 g/dL — ABNORMAL LOW (ref 6.5–8.1)

## 2024-11-23 MED ORDER — MORPHINE SULFATE (PF) 4 MG/ML IV SOLN
4.0000 mg | Freq: Once | INTRAVENOUS | Status: AC
  Administered 2024-11-23: 4 mg via INTRAVENOUS
  Filled 2024-11-23: qty 1

## 2024-11-23 MED ORDER — PANTOPRAZOLE SODIUM 40 MG PO TBEC: 40.0000 mg | DELAYED_RELEASE_TABLET | Freq: Every day | ORAL | 1 refills | Status: AC

## 2024-11-23 MED ORDER — OXYCODONE HCL ER 10 MG PO T12A: 10.0000 mg | EXTENDED_RELEASE_TABLET | Freq: Two times a day (BID) | ORAL | 0 refills | Status: AC

## 2024-11-23 MED ORDER — OXYCODONE-ACETAMINOPHEN 5-325 MG PO TABS: 2.0000 | ORAL_TABLET | Freq: Once | ORAL | Status: DC

## 2024-11-23 MED ORDER — OXYCODONE HCL 10 MG PO TABS: 10.0000 mg | ORAL_TABLET | Freq: Four times a day (QID) | ORAL | 0 refills | Status: AC | PRN

## 2024-11-23 MED ORDER — IOHEXOL 350 MG/ML SOLN
75.0000 mL | Freq: Once | INTRAVENOUS | Status: AC | PRN
  Administered 2024-11-23: 75 mL via INTRAVENOUS

## 2024-11-23 MED ORDER — HEPARIN SOD (PORK) LOCK FLUSH 100 UNIT/ML IV SOLN
500.0000 [IU] | Freq: Once | INTRAVENOUS | Status: AC
  Administered 2024-11-23: 500 [IU]
  Filled 2024-11-23: qty 5

## 2024-11-23 NOTE — ED Provider Notes (Signed)
" °  Physical Exam  BP 128/65   Pulse 84   Temp (!) 97.3 F (36.3 C) (Oral)   Resp 18   SpO2 100%   Physical Exam Vitals and nursing note reviewed.  HENT:     Head:     Comments: Large hematoma over right parietal scalp Eyes:     Pupils: Pupils are equal, round, and reactive to light.  Cardiovascular:     Rate and Rhythm: Normal rate and regular rhythm.  Pulmonary:     Effort: Pulmonary effort is normal.     Breath sounds: Normal breath sounds.  Abdominal:     Palpations: Abdomen is soft.     Tenderness: There is no abdominal tenderness.  Skin:    General: Skin is warm and dry.  Neurological:     Mental Status: She is alert.  Psychiatric:        Mood and Affect: Mood normal.     Procedures  Procedures  ED Course / MDM   Clinical Course as of 11/23/24 1231  Fri Nov 23, 2024  1229 Laboratory workup with no acute changes.  No acute traumatic findings.  Redemonstration of known metastatic processes.  Patient has remained stable here.  Able to ambulate.  She will be discharged home with instruction for PCP follow-up [MP]    Clinical Course User Index [MP] Pamella Ozell LABOR, DO   Medical Decision Making I, Ozell Pamella DO, have assumed care of this patient from the previous provider major laboratory workup CT imaging after fall at home reevaluation and disposition  Amount and/or Complexity of Data Reviewed Labs: ordered. Radiology: ordered. ECG/medicine tests: ordered.  Risk Prescription drug management.          Pamella Ozell LABOR, DO 11/23/24 1231  "

## 2024-11-23 NOTE — ED Notes (Signed)
 Pt provided with discharge and follow up instructions, medications discussed, pt verbalized understanding. Chest port, VSS, pt ambulatory out of ED w/ all paperwork and belongings in NAD.

## 2024-11-23 NOTE — Telephone Encounter (Signed)
 Received a phone call today from family friend, Leita, who reported that patient was currently in the [facility] after a fall in the bathtub with head impact. Patient does have appointments next week at [facility]. Dr. Autumn aware.

## 2024-11-23 NOTE — Discharge Instructions (Addendum)
 You were seen in the emerged ferment after a fall You have a large hematoma on your head but no evidence of bleeding There is no other evidence of severe broken bones or injuries Follow-up with your primary team and return to the emergency department for repeated falls

## 2024-11-23 NOTE — ED Notes (Signed)
 Sharyne, RN accessed port

## 2024-11-23 NOTE — ED Provider Notes (Signed)
 " [facility] Emergency Department Provider Note MRN:  [id number]  Arrival date & time: 11/23/24     Chief Complaint   Fall   History of Present Illness   Jo Allen is a 59 y.o. year-old female with a history of CAD, pancreatic cancer presenting to the ED with chief complaint of fall.  Patient explains that she was walking to the bathroom and she fell.  She is unsure if she just fell or if she passed out.  Unsure if she hit her head.  She is endorsing pain to the right side of her body and her lower back.  Denies chest pain or shortness of breath.  Review of Systems  A thorough review of systems was obtained and all systems are negative except as noted in the HPI and PMH.   Patient's Health History    Past Medical History:  Diagnosis Date   Acute pancreatitis 04/19/2024   Asthma    CAD (coronary artery disease)    Cancer (HCC)    GERD (gastroesophageal reflux disease)    Headache    Heart disease    Hypertension    Obesity    Paresthesia 08/20/2015   Prediabetes    Transfusion history    25yrs ago    Past Surgical History:  Procedure Laterality Date   ANGIOPLASTY     BREAST BIOPSY Left    BREAST BIOPSY WITH SENTINEL LYMPH NODE BIOPSY AND NEEDLE LOCALIZATION Left    COLONOSCOPY WITH PROPOFOL  N/A 02/19/2014   Procedure: COLONOSCOPY WITH PROPOFOL ;  Surgeon: Gladis MARLA Louder, MD;  [facility];  Service: Endoscopy;  Laterality: N/A;   ESOPHAGOGASTRODUODENOSCOPY N/A 07/04/2024   Procedure: EGD (ESOPHAGOGASTRODUODENOSCOPY);  Surgeon: Burnette Fallow, MD;  Location: [facility];  Service: Gastroenterology;  Laterality: N/A;   EUS N/A 07/04/2024   Procedure: ULTRASOUND, UPPER GI TRACT, ENDOSCOPIC;  Surgeon: Burnette Fallow, MD;  Location[facility];  Service: Gastroenterology;  Laterality: N/A;   FINE NEEDLE ASPIRATION  07/04/2024   Procedure: FINE NEEDLE ASPIRATION;  Surgeon: Burnette Fallow, MD;  Location: [facility];  Service:  Gastroenterology;;   IR IMAGING GUIDED PORT INSERTION  08/27/2024   JOINT REPLACEMENT Bilateral    LEFT HEART CATH AND CORONARY ANGIOGRAPHY N/A 04/29/2023   Procedure: LEFT HEART CATH AND CORONARY ANGIOGRAPHY;  Surgeon: Ladona Heinz, MD;  [facility];  Service: Cardiovascular;  Laterality: N/A;   TOTAL KNEE ARTHROPLASTY Bilateral     Family History  Problem Relation Age of Onset   Hypertension Mother    Diabetes Mother    Stroke Father    Headache Sister    Depression Brother    Breast cancer Neg Hx     Social History   Socioeconomic History   Marital status: Single    Spouse name: Not on file   Number of children: Not on file   Years of education: Not on file   Highest education level: Not on file  Occupational History   Not on file  Tobacco Use   Smoking status: Never    Passive exposure: Past   Smokeless tobacco: Never  Vaping Use   Vaping status: Never Used  Substance and Sexual Activity   Alcohol use: No   Drug use: No   Sexual activity: Not Currently  Other Topics Concern   Not on file  Social History Narrative   Not on file   Social Drivers of Health   Tobacco Use: Low Risk (11/23/2024)   Patient History  Smoking Tobacco Use: Never    Smokeless Tobacco Use: Never    Passive Exposure: Past  Financial Resource Strain: Low Risk (12/19/2023)   Received from [facility]   Overall Financial Resource Strain (CARDIA)    Difficulty of Paying Living Expenses: Not hard at all  Food Insecurity: No Food Insecurity (11/05/2024)   ACO Reach    Worried About Running Out of Food in the Last Year: No    Ran Out of Food in the Last Year: No  Transportation Needs: No Transportation Needs (11/05/2024)   ACO Reach    Lack of Transportation: No  Physical Activity: Unknown (11/02/2022)   Received from [facility]   Exercise Vital Sign    On average, how many days per week do you engage in moderate to strenuous exercise (like a brisk walk)?: Patient declined     On average, how many minutes do you engage in exercise at this level?: 0 min  Stress: Stress Concern Present (11/02/2022)   Received from [facility] of Occupational Health - Occupational Stress Questionnaire    Feeling of Stress : To some extent  Social Connections: Socially Integrated (11/02/2022)   Received from [facility]   Social Network    How would you rate your social network (family, work, friends)?: Good participation with social networks  Intimate Partner Violence: Not At Risk (11/05/2024)   ACO Reach    Feels Physically and Emotionally Safe: Yes    Physically Hurt by Someone: No    Humiliated or Emotionally Abused by Someone: No  Depression (PHQ2-9): Low Risk (11/08/2024)   Depression (PHQ2-9)    PHQ-2 Score: 0  Alcohol Screen: Not on file  Housing: Not At Risk (11/05/2024)   ACO Reach    Has Housing: Yes    Worried About Losing Housing: No    Unable to Get Utilities: No  Utilities: Not At Risk (11/05/2024)   ACO Reach    Has Housing: Yes    Worried About Losing Housing: No    Unable to Get Utilities: No  Health Literacy: Not on file     Physical Exam   Vitals:   11/23/24 0605 11/23/24 0632  BP:    Pulse: 83   Resp: 18   Temp:    SpO2: 96% 97%    CONSTITUTIONAL: Well-appearing, NAD NEURO/PSYCH:  Alert and oriented x 3, no focal deficits EYES:  eyes equal and reactive ENT/NECK:  no LAD, no JVD CARDIO: Regular rate, well-perfused, normal S1 and S2 PULM:  CTAB no wheezing or rhonchi GI/GU:  non-distended, non-tender MSK/SPINE:  No gross deformities, no edema SKIN:  no rash, atraumatic   *Additional and/or pertinent findings included in MDM below  Diagnostic and Interventional Summary    EKG Interpretation Date/Time:    Ventricular Rate:    PR Interval:    QRS Duration:    QT Interval:    QTC Calculation:   R Axis:      Text Interpretation:         Labs Reviewed  CBC  COMPREHENSIVE METABOLIC PANEL WITH GFR    DG  Chest Port 1 View    (Results Pending)  CT HEAD WO CONTRAST ( )    (Results Pending)  CT CERVICAL SPINE WO CONTRAST    (Results Pending)  CT L-SPINE NO CHARGE    (Results Pending)  CT Angio Chest Pulmonary Embolism (PE) W or WO Contrast    (Results Pending)  CT ABDOMEN PELVIS W CONTRAST    (  Results Pending)    Medications  morphine  (PF) 4 MG/ML injection 4 mg (has no administration in time range)     Procedures  /  Critical Care Procedures  ED Course and Medical Decision Making  Initial Impression and Ddx Unclear if mechanical fall or syncope.  Differential diagnosis including electrolyte disturbance, anemia, arrhythmia, pulmonary embolism considered given patient's known cancer.  Neuroendocrine tumor of the pancreas.  Currently undergoing chemotherapy management.  Vitals are reassuring without fever.  From a trauma perspective, she has preserved range of motion of the extremities.  She is having neck pain as well as low back pain, tenderness to the abdomen as well on exam.  Lung sounds are present.  Will obtain CT imaging to evaluate for spinal injury, blunt trauma to the abdomen.  Past medical/surgical history that increases complexity of ED encounter: Neuroendocrine tumor of the pancreas  Interpretation of Diagnostics Labs, CT imaging pending Patient Reassessment and Ultimate Disposition/Management     Signed out to oncoming provider at shift change.  Patient management required discussion with the following services or consulting groups:  None  Complexity of Problems Addressed Acute illness or injury that poses threat of life of bodily function  Additional Data Reviewed and Analyzed Further history obtained from: Prior labs/imaging results  Additional Factors Impacting ED Encounter Risk Use of parenteral controlled substances and Consideration of hospitalization  Ozell HERO. Theadore, MD [facility] Emergency Medicine [facility] [email or web address]  Final  Clinical Impressions(s) / ED Diagnoses     ICD-10-CM   1. Fall, initial encounter  W19.[facility]       ED Discharge Orders     None        Discharge Instructions Discussed with and Provided to Patient:   Discharge Instructions   None      Theadore Ozell HERO, MD 11/23/24 [phone]  "

## 2024-11-23 NOTE — ED Notes (Signed)
 Patient alert and oriented, 8/10 pain. Mostly in neck with bottom pain after riding in ambulance.Patient denied dizziness at this time but does not remember what happened.

## 2024-11-23 NOTE — ED Triage Notes (Signed)
 Arrived with EMS  after a fall in the bathroom. Unsure what caused her to fall. C/o right side pain. EMS placed c-collar and placed on spine board for transport. Pt is a cancer patient and has port to chest.

## 2024-11-23 NOTE — ED Notes (Signed)
 Provider aware patient wishes to lay flat due to pain. Request alternative route of pain control.

## 2024-11-23 NOTE — ED Notes (Signed)
 Patient transported to CT

## 2024-11-26 ENCOUNTER — Encounter (HOSPITAL_COMMUNITY): Admission: RE | Admit: 2024-11-26

## 2024-11-27 ENCOUNTER — Inpatient Hospital Stay: Attending: Oncology

## 2024-11-28 ENCOUNTER — Inpatient Hospital Stay

## 2024-11-28 ENCOUNTER — Inpatient Hospital Stay: Admitting: Oncology

## 2024-11-29 ENCOUNTER — Inpatient Hospital Stay

## 2024-11-30 ENCOUNTER — Inpatient Hospital Stay

## 2024-12-11 ENCOUNTER — Telehealth: Payer: Self-pay

## 2024-12-17 ENCOUNTER — Ambulatory Visit (INDEPENDENT_AMBULATORY_CARE_PROVIDER_SITE_OTHER): Admitting: Otolaryngology
